# Patient Record
Sex: Female | Born: 1945
Health system: Southern US, Community
[De-identification: ages and names within clinical notes are randomized; demographics above are authoritative.]

## PROBLEM LIST (undated history)

## (undated) DIAGNOSIS — L930 Discoid lupus erythematosus: Secondary | ICD-10-CM

## (undated) DIAGNOSIS — G4700 Insomnia, unspecified: Secondary | ICD-10-CM

## (undated) DIAGNOSIS — C801 Malignant (primary) neoplasm, unspecified: Secondary | ICD-10-CM

## (undated) DIAGNOSIS — N289 Disorder of kidney and ureter, unspecified: Secondary | ICD-10-CM

## (undated) DIAGNOSIS — M8580 Other specified disorders of bone density and structure, unspecified site: Secondary | ICD-10-CM

## (undated) DIAGNOSIS — M329 Systemic lupus erythematosus, unspecified: Secondary | ICD-10-CM

## (undated) DIAGNOSIS — IMO0002 Reserved for concepts with insufficient information to code with codable children: Secondary | ICD-10-CM

## (undated) DIAGNOSIS — R51 Headache: Secondary | ICD-10-CM

## (undated) HISTORY — DX: Discoid lupus erythematosus: L93.0

## (undated) HISTORY — DX: Insomnia, unspecified: G47.00

## (undated) HISTORY — PX: PELVIC LAPAROSCOPY: SHX162

## (undated) HISTORY — DX: Systemic lupus erythematosus, unspecified: M32.9

## (undated) HISTORY — DX: Other specified disorders of bone density and structure, unspecified site: M85.80

## (undated) HISTORY — DX: Reserved for concepts with insufficient information to code with codable children: IMO0002

## (undated) HISTORY — DX: Disorder of kidney and ureter, unspecified: N28.9

## (undated) HISTORY — DX: Headache: R51

## (undated) HISTORY — PX: CATARACT EXTRACTION, BILATERAL: SHX1313

## (undated) HISTORY — DX: Malignant (primary) neoplasm, unspecified: C80.1

---

## 1999-01-17 ENCOUNTER — Other Ambulatory Visit: Admission: RE | Admit: 1999-01-17 | Discharge: 1999-01-17 | Payer: Self-pay | Admitting: Obstetrics and Gynecology

## 2000-04-17 ENCOUNTER — Other Ambulatory Visit: Admission: RE | Admit: 2000-04-17 | Discharge: 2000-04-17 | Payer: Self-pay | Admitting: Obstetrics and Gynecology

## 2001-06-26 ENCOUNTER — Other Ambulatory Visit: Admission: RE | Admit: 2001-06-26 | Discharge: 2001-06-26 | Payer: Self-pay | Admitting: Obstetrics and Gynecology

## 2002-07-03 ENCOUNTER — Other Ambulatory Visit: Admission: RE | Admit: 2002-07-03 | Discharge: 2002-07-03 | Payer: Self-pay | Admitting: Obstetrics and Gynecology

## 2003-07-09 ENCOUNTER — Other Ambulatory Visit: Admission: RE | Admit: 2003-07-09 | Discharge: 2003-07-09 | Payer: Self-pay | Admitting: Obstetrics and Gynecology

## 2003-10-05 ENCOUNTER — Ambulatory Visit (HOSPITAL_COMMUNITY): Admission: RE | Admit: 2003-10-05 | Discharge: 2003-10-05 | Payer: Self-pay | Admitting: Gastroenterology

## 2004-07-17 ENCOUNTER — Other Ambulatory Visit: Admission: RE | Admit: 2004-07-17 | Discharge: 2004-07-17 | Payer: Self-pay | Admitting: Obstetrics and Gynecology

## 2005-07-25 ENCOUNTER — Other Ambulatory Visit: Admission: RE | Admit: 2005-07-25 | Discharge: 2005-07-25 | Payer: Self-pay | Admitting: Addiction Medicine

## 2006-07-31 ENCOUNTER — Other Ambulatory Visit: Admission: RE | Admit: 2006-07-31 | Discharge: 2006-07-31 | Payer: Self-pay | Admitting: Obstetrics and Gynecology

## 2006-10-08 HISTORY — PX: OTHER SURGICAL HISTORY: SHX169

## 2006-12-09 ENCOUNTER — Emergency Department (HOSPITAL_COMMUNITY): Admission: EM | Admit: 2006-12-09 | Discharge: 2006-12-09 | Payer: Self-pay | Admitting: Emergency Medicine

## 2006-12-12 ENCOUNTER — Encounter: Admission: RE | Admit: 2006-12-12 | Discharge: 2006-12-12 | Payer: Self-pay | Admitting: Urology

## 2007-01-29 ENCOUNTER — Inpatient Hospital Stay (HOSPITAL_COMMUNITY): Admission: RE | Admit: 2007-01-29 | Discharge: 2007-02-02 | Payer: Self-pay | Admitting: Urology

## 2007-01-29 ENCOUNTER — Encounter (INDEPENDENT_AMBULATORY_CARE_PROVIDER_SITE_OTHER): Payer: Self-pay | Admitting: Specialist

## 2007-08-06 ENCOUNTER — Other Ambulatory Visit: Admission: RE | Admit: 2007-08-06 | Discharge: 2007-08-06 | Payer: Self-pay | Admitting: Obstetrics and Gynecology

## 2007-08-11 ENCOUNTER — Ambulatory Visit (HOSPITAL_COMMUNITY): Admission: RE | Admit: 2007-08-11 | Discharge: 2007-08-11 | Payer: Self-pay | Admitting: Urology

## 2008-02-23 ENCOUNTER — Ambulatory Visit (HOSPITAL_COMMUNITY): Admission: RE | Admit: 2008-02-23 | Discharge: 2008-02-23 | Payer: Self-pay | Admitting: Urology

## 2008-09-03 ENCOUNTER — Ambulatory Visit (HOSPITAL_COMMUNITY): Admission: RE | Admit: 2008-09-03 | Discharge: 2008-09-03 | Payer: Self-pay | Admitting: Urology

## 2008-09-06 ENCOUNTER — Ambulatory Visit: Payer: Self-pay | Admitting: Obstetrics and Gynecology

## 2008-09-06 ENCOUNTER — Other Ambulatory Visit: Admission: RE | Admit: 2008-09-06 | Discharge: 2008-09-06 | Payer: Self-pay | Admitting: Obstetrics and Gynecology

## 2008-09-06 ENCOUNTER — Encounter: Payer: Self-pay | Admitting: Obstetrics and Gynecology

## 2008-10-05 ENCOUNTER — Ambulatory Visit: Payer: Self-pay | Admitting: Obstetrics and Gynecology

## 2009-03-01 ENCOUNTER — Ambulatory Visit (HOSPITAL_COMMUNITY): Admission: RE | Admit: 2009-03-01 | Discharge: 2009-03-01 | Payer: Self-pay | Admitting: Urology

## 2009-10-04 ENCOUNTER — Other Ambulatory Visit: Admission: RE | Admit: 2009-10-04 | Discharge: 2009-10-04 | Payer: Self-pay | Admitting: Obstetrics and Gynecology

## 2009-10-04 ENCOUNTER — Ambulatory Visit: Payer: Self-pay | Admitting: Obstetrics and Gynecology

## 2009-10-17 ENCOUNTER — Ambulatory Visit: Payer: Self-pay | Admitting: Obstetrics and Gynecology

## 2010-02-28 ENCOUNTER — Ambulatory Visit (HOSPITAL_COMMUNITY): Admission: RE | Admit: 2010-02-28 | Discharge: 2010-02-28 | Payer: Self-pay | Admitting: Urology

## 2010-11-07 ENCOUNTER — Ambulatory Visit
Admission: RE | Admit: 2010-11-07 | Discharge: 2010-11-07 | Payer: Self-pay | Source: Home / Self Care | Attending: Obstetrics and Gynecology | Admitting: Obstetrics and Gynecology

## 2010-11-28 ENCOUNTER — Other Ambulatory Visit: Payer: Self-pay | Admitting: Obstetrics and Gynecology

## 2010-11-28 ENCOUNTER — Encounter (INDEPENDENT_AMBULATORY_CARE_PROVIDER_SITE_OTHER): Payer: Federal, State, Local not specified - PPO | Admitting: Obstetrics and Gynecology

## 2010-11-28 ENCOUNTER — Other Ambulatory Visit (HOSPITAL_COMMUNITY)
Admission: RE | Admit: 2010-11-28 | Discharge: 2010-11-28 | Disposition: A | Discharge status: Home / Self Care | Payer: Federal, State, Local not specified - PPO | Source: Ambulatory Visit | Attending: Obstetrics and Gynecology | Admitting: Obstetrics and Gynecology

## 2010-11-28 DIAGNOSIS — Z124 Encounter for screening for malignant neoplasm of cervix: Secondary | ICD-10-CM | POA: Insufficient documentation

## 2010-11-28 DIAGNOSIS — Z01419 Encounter for gynecological examination (general) (routine) without abnormal findings: Secondary | ICD-10-CM

## 2010-11-28 DIAGNOSIS — Z833 Family history of diabetes mellitus: Secondary | ICD-10-CM

## 2010-11-28 DIAGNOSIS — R634 Abnormal weight loss: Secondary | ICD-10-CM

## 2011-02-23 NOTE — Discharge Summary (Signed)
Andrea Burke, Andrea Burke             ACCOUNT NO.:  [id number]   MEDICAL RECORD NO.:  [id number]          PATIENT TYPE:  INP   LOCATION:  1438                         FACILITY:  [facility]   PHYSICIAN:  Heloise Purpura, MD      DATE OF BIRTH:  [birth year]   DATE OF ADMISSION:  01/29/2007  DATE OF DISCHARGE:  02/02/2007                               DISCHARGE SUMMARY   ADMISSION DIAGNOSIS:  Left renal mass.   DISCHARGE DIAGNOSIS:  Left renal mass.   HISTORY AND PHYSICAL:  For full details, please see admission history  and physical.  Briefly, Andrea Burke is a 65 year old female who was  found to have an incidentally detected enhancing left renal mass  concerning for a possible renal malignancy.  After discussing options,  she elected to proceed with surgical removal.  Her metastatic evaluation  was negative.   HOSPITAL COURSE:  On January 29, 2007, the patient was taken to the  operating room.  An attempt was made to perform a partial nephrectomy.  However, due to the location of the tumor, the patient did necessitate  and a total nephrectomy for optimal cancer control.  This was performed  laparoscopically.  Postoperatively, the patient was able to be  transferred to a regular hospital room following recovery from  anesthesia.  On postoperative day #1, the patient was able to begin  ambulating which she did with minimal difficulty.  She was able to begin  a clear liquid diet.  Her renal function remained stable at 1.0 and her  hemoglobin remained stable at 11.0.  Over the course of the next couple  of days, the patient's diet was gradually advanced and she was  transitioned to oral pain medication.  She was prepared for discharge on  postoperative day #3.  However, she did have some emesis the evening  before planned discharge and therefore was monitored on postoperative  day #3.  She subsequently had improving return of bowel function with  resolution of her emesis was able to be discharged  home in good  condition on postoperative day #4.   DISPOSITION:  Home.   DISCHARGE MEDICATIONS:  The patient was given a prescription to take  Percocet as needed for pain and Colace as a stool softener.  She was  instructed to resume her regular home medications which were reviewed  with her prior to discharge.  She was specifically instructed to refrain  from any heavy lifting.  She was told to refrain from any aspirin,  nonsteroidal anti-inflammatory drugs, or herbal supplements.   DISCHARGE INSTRUCTIONS:  The patient was instructed on the signs and  symptoms of wound infection.  She was told to be ambulatory but  specifically instructed to refrain from any heavy lifting, strenuous  activity, or driving.   FOLLOW UP:  Andrea Burke will follow up in 1 week for removal of her  staples.   PATHOLOGY:  The patient's pathology was reviewed with her while in the  hospital.  This demonstrated a pT1a NX/MX Fuhrman grade IV renal cell  carcinoma with negative surgical margins.  ______________________________  Heloise Purpura, MD  Electronically Signed     LB/MEDQ  D:  02/02/2007  T:  02/02/2007  Job:  [id number]

## 2011-02-23 NOTE — Op Note (Signed)
Andrea Burke, Andrea Burke             ACCOUNT NO.:  [id number]   MEDICAL RECORD NO.:  [id number]          PATIENT TYPE:  INP   LOCATION:  1438                         FACILITY:  [facility]   PHYSICIAN:  Heloise Purpura, MD      DATE OF BIRTH:  [birth year]   DATE OF PROCEDURE:  01/29/2007  DATE OF DISCHARGE:                               OPERATIVE REPORT   PREOPERATIVE DIAGNOSIS:  Left renal mass.   POSTOPERATIVE DIAGNOSIS:  Left renal mass.   PROCEDURE:  Robotic assisted left laparoscopic partial nephrectomy  converted to laparoscopic radical nephrectomy.   SURGEON:  Heloise Purpura, M.D.   ASSISTANT:  Excell Seltzer. Annabell Howells, M.D.   ANESTHESIA:  General.   COMPLICATIONS:  None.   ESTIMATED BLOOD LOSS:  600 mL.   INTRAVENOUS FLUIDS:  3200 mL of lactated Ringer's.   SPECIMENS:  1. Base of tumor resection bed number 1 and 2.  2. Left renal mass.  3. Left kidney.   DISPOSITION OF SPECIMEN:  To pathology.   INDICATIONS FOR PROCEDURE:  Andrea Burke is a 65 year old female who was  found to have a 2.7 cm enhancing left renal mass detected incidentally.  This was worrisome for a renal cell carcinoma.  The patient underwent a  metastatic evaluation that was negative and after discussing management  options for an enhancing left renal mass, she elected to proceed with  surgical therapy.  We discussed potential surgical options including a  nephron sparing approach versus radical nephrectomy.  Due to the size of  the tumor, it was decided that a nephron sparing approach would be  ideal.  However, due to the location of the tumor near the renal hilum,  it was discussed with the patient that it may be necessary to proceed  with a total nephrectomy.  In addition, different surgical approaches  were discussed with the patient and it was discussed with the patient  that a minimally invasive approach would likely be possible, but that  she certainly was at high risk for necessitating open surgical  conversion if that was felt to be necessary.  The potential risks and  benefits of the above procedures were discussed with the patient and she  consented.   DESCRIPTION OF PROCEDURE:  The patient was taken to the operating room  and general anesthetic was administered.  She was given preoperative  antibiotics, placed in the left modified flank position, and prepped and  draped in the usual sterile fashion.  Next, a preoperative time out was  performed.  A site was selected just superior to the umbilicus for  placement of the camera port.  This was placed using a standard open  Hassan technique, thereby allowing entry into the peritoneal cavity  under direct vision.  A 12 mm port was then placed and a  pneumoperitoneum was established.  The 0 degrees lens was used to  inspect the abdomen and there was no evidence of any intra-abdominal  injuries or other abnormalities.  The remaining ports were then placed.  An 8 mm robotic port was placed in the left upper quadrant.  An  additional 8  mm robotic port was placed in the left lower quadrant.  In  addition, another robotic port was placed in the lateral and inferior  aspect of the left lower quadrant.  Assistant ports were then placed  with a 12 mm port in the lower midline and a 5 mm port in the upper  midline.  All ports were placed under direct vision without difficulty.  The surgical cart was then docked.   With the aid of the cautery scissors, the white line of Toldt was  incised along the length of the descending colon allowing the colon to  be mobilized medially and the space between the mesocolon and the  anterior layer of Gerota's fascia to be developed.  The gonadal vein was  identified and traced cephalad to the renal vein which was cleared off  of the overlying adventitial tissue.  The ureter was identified.  The  ureter was separated from the gonadal vein.  Just posterior to the renal  vein, a second renal vein was identified.   Just superior to this vein  and still posterior to the renal vein, was noted to be the main renal  artery.  The patient was noted to have a single renal artery on her  preoperative imaging study.  The renal artery and renal vein were  isolated and attention turned to the kidney.  Gerota's fascia was  entered.  The patient was noted to have an obvious exophytic lesion on  the anterior aspect of the kidney just next to and inferior to the renal  hilum.  The surrounding perinephric fat was cleared off of the renal  capsule.  The edges of the tumor were identified.  It was noted to  extend very close to but not into the renal hilum.   Once the tumor was isolated, it was examined.  It was felt that this  tumor would be amenable to an attempt at a nephron sparing approach.  12.5 grams of intravenous mannitol was administered.  Ten minutes later,  the renal artery was clamped with a laparoscopic bulldog clamp.  The  renal vein was then also clamped with a laparoscopic bulldog clamp.  The  tumor was then excised with sharp cold scissor dissection.  Once the  tumor was excised, the base of the resection site was sampled at the  deep and lateral margins for intraoperative pathology analysis.  While  one specimen was benign, the other margin was noted to be positive for  malignancy at the deep aspect of the tumor resection.  At this point, it  was felt that it would be extremely difficult and potentially unsafe to  resect deeper in the area of the renal hilum.  The renal mass was sent  for intraoperative pathologic analysis and was, indeed, consistent with  a clear cell renal cell carcinoma.  Based on this information, it was  decided to perform a total nephrectomy.   The kidney was completely mobilized.  The renal artery was divided  between multiple Hem-A-Lock clips and the renal vein was divided between  multiple Hem-A-Lock clips.  The ureter was isolated and divided off between Hem-A-Lock  clips.  The remaining attachments superior and  lateral to the kidney were then divided allowing the kidney to be freed  and placed into the EndoCatch II bag.  The renal fossa was then  reexamined.  There was no evidence of any bleeding and hemostasis  appeared excellent.  The renal fossa was copiously irrigated and  preparations  were then made for removal of the kidney and closure.  The  surgical cart was then undocked.  The 12 mm camera port site was closed  with a 0 Vicryl suture placed laparoscopically with the aid of the  suture passer device.  All remaining ports were then removed under  direct vision.  The EndoCatch II bag was then brought out through the  lower midline 12 mm port site.  This incision was extended in the lower  midline allowing the specimen to be removed intact within the EndoCatch  II bag.  Of note, the renal mass had been previously removed via an  Endopouch bag so as to avoid tumor spillage.   This opening in the fascia was then closed with a running 0 Vicryl  suture.  All incision sites were then injected with 0.25% Marcaine and  reapproximated at the skin level with staples.  Sterile dressings were  applied.  The patient  appeared to tolerate the procedure well without complications.  She was  able to be extubated and transferred to the recovery room in stable  condition.  This procedure was considerably more complex than a standard  laparscopic nephrectomy and did involve significantly more time due to  the attempt at partial nephrectomy.           ______________________________  Heloise Purpura, MD  Electronically Signed     LB/MEDQ  D:  01/29/2007  T:  01/29/2007  Job:  [id number]

## 2011-02-23 NOTE — H&P (Signed)
Andrea Burke, Andrea Burke             ACCOUNT NO.:  [id number]   MEDICAL RECORD NO.:  [id number]          PATIENT TYPE:  INP   LOCATION:  NA                           FACILITY:  [facility]   PHYSICIAN:  Heloise Purpura, MD      DATE OF BIRTH:  1946-02-15   DATE OF ADMISSION:  01/29/2007  DATE OF DISCHARGE:                              HISTORY & PHYSICAL   CHIEF COMPLAINT:  Left renal mass.   HISTORY OF PRESENT ILLNESS:  Andrea Burke is a 65 year old female who  was incidentally found to have an enhancing left renal mass after an  evaluation for abdominal pain which was subsequently felt to be  secondary to gastroenteritis.  As part of her evaluation, she did  undergo a CT scan which demonstrated a 2.7 cm exophytic enhancing renal  mass off the medial anterior aspect of the lower pole of the left  kidney.  She underwent a metastatic evaluation with a chest x-ray, liver  function test and alkaline phosphatase which were all within normal  limits.  After discussing management options for her renal mass, she  elected to proceed with surgical removal in a nephron-sparing fashion.   PAST MEDICAL HISTORY:  Lupus.   PAST SURGICAL HISTORY:  Exploratory laparoscopy for endometriosis.   MEDICATIONS:  Hydroxyzine p.r.n.   ALLERGIES:  PENICILLIN.   FAMILY HISTORY:  The patient's sister had breast cancer.  There is no  history of GU malignancy.   SOCIAL HISTORY:  She denies tobacco or alcohol use.   REVIEW OF SYSTEMS:  Positive for history of joint pain.  This is chronic  and she relates this to her lupus.  All other systems are reviewed and  are otherwise negative.   PHYSICAL EXAMINATION:  CONSTITUTIONAL:  Well-nourished, well-developed  age-appropriate female in no acute distress.  CARDIOVASCULAR:  Regular rate and rhythm without obvious murmurs.  LUNGS:  Clear bilaterally.  LYMPH NODES:  No cervical, supraclavicular, inguinal lymphadenopathy.  ABDOMEN:  Soft, nontender, nondistended without  abdominal masses.  The  patient has a well-healed keloid scar just below her umbilicus.  BACK:  No CVA tenderness.   IMPRESSION:  Left renal mass worrisome for malignancy.   PLAN:  Andrea Burke will undergo an attempted robotic assisted  laparoscopic partial nephrectomy.  She has been thoroughly counseled  about the risks of this procedure and the potential necessity for total  nephrectomy.  She will then be admitted to the hospital for routine  postoperative care.           ______________________________  Heloise Purpura, MD  Electronically Signed     LB/MEDQ  D:  01/29/2007  T:  01/29/2007  Job:  [id number]

## 2011-02-23 NOTE — Op Note (Signed)
Andrea Burke, Andrea Burke                       ACCOUNT NO.:  [id number]   MEDICAL RECORD NO.:  [id number]                   PATIENT TYPE:  AMB   LOCATION:  ENDO                                 FACILITY:  [facility]   PHYSICIAN:  John C. Madilyn Fireman, M.D.                 DATE OF BIRTH:  [birth year]   DATE OF PROCEDURE:  10/05/2003  DATE OF DISCHARGE:                                 OPERATIVE REPORT   PROCEDURE PERFORMED:  Colonoscopy.   ENDOSCOPIST:  Everardo All. Madilyn Fireman, M.D.   INDICATION FOR PROCEDURE:  Heme-positive stool.   PROCEDURE:  The patient was placed in the left lateral decubitus position  and placed on the pulse monitor with continuous low-flow oxygen delivered by  nasal cannula.  She was sedated with 50 mcg IV fentanyl and 5 mg IV Versed.  The Olympus video colonoscope was inserted into the rectum and advanced to  the cecum, confirmed by transillumination of McBurney's point and  visualization of the ileocecal valve and appendiceal orifice.  The prep was  excellent.  The cecum, ascending, transverse, descending and sigmoid colon  all appeared normal with no masses, polyps, diverticula or other mucosa  abnormalities.  The rectum likewise appeared normal in retroflexed view.  The anus revealed no obvious internal hemorrhoids.  The scope was then  withdrawn and the patient returned to the recovery room in stable condition.  She tolerated the procedure well and there were no immediate complications.   IMPRESSION:  Normal colonoscopy.   PLAN:  We will repeat Hemoccults in two to three weeks and consider upper GI  workup if persistently positive.                                               John C. Madilyn Fireman, M.D.    JCH/MEDQ  D:  10/05/2003  T:  10/05/2003  Job:  [id number]

## 2011-03-13 ENCOUNTER — Ambulatory Visit (INDEPENDENT_AMBULATORY_CARE_PROVIDER_SITE_OTHER): Payer: Federal, State, Local not specified - PPO | Admitting: Psychiatry

## 2011-03-13 DIAGNOSIS — Z7189 Other specified counseling: Secondary | ICD-10-CM

## 2011-03-13 DIAGNOSIS — F4323 Adjustment disorder with mixed anxiety and depressed mood: Secondary | ICD-10-CM

## 2011-03-20 ENCOUNTER — Ambulatory Visit (HOSPITAL_COMMUNITY)
Admission: RE | Admit: 2011-03-20 | Discharge: 2011-03-20 | Disposition: A | Discharge status: Home / Self Care | Payer: Federal, State, Local not specified - PPO | Source: Ambulatory Visit | Attending: Urology | Admitting: Urology

## 2011-03-20 ENCOUNTER — Other Ambulatory Visit (HOSPITAL_COMMUNITY): Payer: Self-pay | Admitting: Urology

## 2011-03-20 DIAGNOSIS — Z87891 Personal history of nicotine dependence: Secondary | ICD-10-CM | POA: Insufficient documentation

## 2011-03-20 DIAGNOSIS — Z85528 Personal history of other malignant neoplasm of kidney: Secondary | ICD-10-CM | POA: Insufficient documentation

## 2011-11-23 DIAGNOSIS — C801 Malignant (primary) neoplasm, unspecified: Secondary | ICD-10-CM | POA: Insufficient documentation

## 2011-11-23 DIAGNOSIS — R519 Headache, unspecified: Secondary | ICD-10-CM | POA: Insufficient documentation

## 2011-11-23 DIAGNOSIS — R51 Headache: Secondary | ICD-10-CM | POA: Insufficient documentation

## 2011-11-23 DIAGNOSIS — M8580 Other specified disorders of bone density and structure, unspecified site: Secondary | ICD-10-CM | POA: Insufficient documentation

## 2011-11-23 DIAGNOSIS — L930 Discoid lupus erythematosus: Secondary | ICD-10-CM | POA: Insufficient documentation

## 2011-11-23 DIAGNOSIS — N809 Endometriosis, unspecified: Secondary | ICD-10-CM | POA: Insufficient documentation

## 2011-11-23 DIAGNOSIS — G4700 Insomnia, unspecified: Secondary | ICD-10-CM | POA: Insufficient documentation

## 2011-12-03 ENCOUNTER — Ambulatory Visit (INDEPENDENT_AMBULATORY_CARE_PROVIDER_SITE_OTHER): Payer: Federal, State, Local not specified - PPO | Admitting: Obstetrics and Gynecology

## 2011-12-03 ENCOUNTER — Encounter: Payer: Self-pay | Admitting: Obstetrics and Gynecology

## 2011-12-03 ENCOUNTER — Other Ambulatory Visit (HOSPITAL_COMMUNITY)
Admission: RE | Admit: 2011-12-03 | Discharge: 2011-12-03 | Disposition: A | Discharge status: Home / Self Care | Payer: Federal, State, Local not specified - PPO | Source: Ambulatory Visit | Attending: Obstetrics and Gynecology | Admitting: Obstetrics and Gynecology

## 2011-12-03 VITALS — BP 124/78 | Ht 60.0 in | Wt 140.0 lb

## 2011-12-03 DIAGNOSIS — N898 Other specified noninflammatory disorders of vagina: Secondary | ICD-10-CM

## 2011-12-03 DIAGNOSIS — Z01419 Encounter for gynecological examination (general) (routine) without abnormal findings: Secondary | ICD-10-CM | POA: Insufficient documentation

## 2011-12-03 DIAGNOSIS — N899 Noninflammatory disorder of vagina, unspecified: Secondary | ICD-10-CM

## 2011-12-03 LAB — LIPID PANEL
LDL Cholesterol: 88 mg/dL (ref 0–99)
Triglycerides: 110 mg/dL (ref <150)

## 2011-12-03 LAB — WET PREP FOR TRICH, YEAST, CLUE: Clue Cells Wet Prep HPF POC: NONE SEEN

## 2011-12-03 MED ORDER — NYSTATIN-TRIAMCINOLONE 100000-0.1 UNIT/GM-% EX OINT: TOPICAL_OINTMENT | Freq: Two times a day (BID) | CUTANEOUS | Status: AC

## 2011-12-03 NOTE — Progress Notes (Signed)
Patient came to see me today for her annual GYN exam. She is doing well without hormone replacement therapy. She is having no vaginal bleeding. She is having no pelvic pain. She's noticed some vaginal irritation at the introitus since December. She is not treated it. She has not been on antibiotics. She also has loss of urine with sneezing, laughing, and running. She does Kegel exercises irregularly. She has osteopenia without an elevated FRAX risk. She has had no fractures. Her last bone density was 2012. She is up-to-date on mammograms.  Physical examination: Kennon Portela present. HEENT within normal limits. Neck: Thyroid not large. No masses. Supraclavicular nodes: not enlarged. Breasts: Examined in both sitting midline position. No skin changes and no masses. Abdomen: Soft no guarding rebound or masses or hernia. Pelvic: External: Within normal limits. BUS: Within normal limits. Vaginal:within normal limits. Poor  estrogen effect. No evidence of cystocele rectocele or enterocele. Cervix: clean. Uterus: Normal size and shape. Adnexa: No masses. Rectovaginal exam: Confirmatory and negative. Extremities: Within normal limits.  Wet smear negative for yeast, amine, trich and clue cells.  Assessment: #1. Urinary stress incontinence #2. Osteopenia #3. Yeast vaginitis  Plan: Mycolog cream. Continue bone densities. Continue yearly mammograms. Increase Kegel exercises. Urological consult if incontinence gets worse.

## 2011-12-04 LAB — URINALYSIS W MICROSCOPIC + REFLEX CULTURE
Bacteria, UA: NONE SEEN
Casts: NONE SEEN
Crystals: NONE SEEN
Hgb urine dipstick: NEGATIVE
Ketones, ur: NEGATIVE mg/dL
Leukocytes, UA: NEGATIVE
Protein, ur: NEGATIVE mg/dL
Specific Gravity, Urine: 1.013 (ref 1.005–1.030)
Squamous Epithelial / LPF: NONE SEEN

## 2012-02-15 ENCOUNTER — Encounter: Payer: Self-pay | Admitting: Obstetrics and Gynecology

## 2012-03-25 ENCOUNTER — Ambulatory Visit (HOSPITAL_COMMUNITY)
Admission: RE | Admit: 2012-03-25 | Discharge: 2012-03-25 | Disposition: A | Discharge status: Home / Self Care | Payer: Federal, State, Local not specified - PPO | Source: Ambulatory Visit | Attending: Urology | Admitting: Urology

## 2012-03-25 ENCOUNTER — Other Ambulatory Visit (HOSPITAL_COMMUNITY): Payer: Self-pay | Admitting: Urology

## 2012-03-25 DIAGNOSIS — C649 Malignant neoplasm of unspecified kidney, except renal pelvis: Secondary | ICD-10-CM

## 2012-05-12 DIAGNOSIS — B351 Tinea unguium: Secondary | ICD-10-CM | POA: Insufficient documentation

## 2012-05-12 DIAGNOSIS — L814 Other melanin hyperpigmentation: Secondary | ICD-10-CM | POA: Insufficient documentation

## 2013-02-09 ENCOUNTER — Other Ambulatory Visit: Payer: Self-pay | Admitting: Obstetrics and Gynecology

## 2013-02-09 ENCOUNTER — Other Ambulatory Visit (HOSPITAL_COMMUNITY)
Admission: RE | Admit: 2013-02-09 | Discharge: 2013-02-09 | Disposition: A | Discharge status: Home / Self Care | Payer: Federal, State, Local not specified - PPO | Source: Ambulatory Visit | Attending: Obstetrics and Gynecology | Admitting: Obstetrics and Gynecology

## 2013-02-09 DIAGNOSIS — N760 Acute vaginitis: Secondary | ICD-10-CM | POA: Insufficient documentation

## 2013-02-09 DIAGNOSIS — Z1151 Encounter for screening for human papillomavirus (HPV): Secondary | ICD-10-CM | POA: Insufficient documentation

## 2013-02-09 DIAGNOSIS — Z01419 Encounter for gynecological examination (general) (routine) without abnormal findings: Secondary | ICD-10-CM | POA: Insufficient documentation

## 2013-03-25 ENCOUNTER — Other Ambulatory Visit (HOSPITAL_COMMUNITY): Payer: Self-pay | Admitting: Urology

## 2013-03-25 ENCOUNTER — Ambulatory Visit (HOSPITAL_COMMUNITY)
Admission: RE | Admit: 2013-03-25 | Discharge: 2013-03-25 | Disposition: A | Discharge status: Home / Self Care | Payer: Federal, State, Local not specified - PPO | Source: Ambulatory Visit | Attending: Urology | Admitting: Urology

## 2013-03-25 DIAGNOSIS — C649 Malignant neoplasm of unspecified kidney, except renal pelvis: Secondary | ICD-10-CM

## 2013-06-09 ENCOUNTER — Ambulatory Visit (INDEPENDENT_AMBULATORY_CARE_PROVIDER_SITE_OTHER): Payer: Federal, State, Local not specified - PPO | Admitting: Emergency Medicine

## 2013-06-09 VITALS — BP 116/62 | HR 81 | Temp 98.6°F | Resp 18 | Ht 60.0 in | Wt 145.0 lb

## 2013-06-09 DIAGNOSIS — W57XXXA Bitten or stung by nonvenomous insect and other nonvenomous arthropods, initial encounter: Secondary | ICD-10-CM

## 2013-06-09 DIAGNOSIS — T148 Other injury of unspecified body region: Secondary | ICD-10-CM

## 2013-06-09 MED ORDER — CYPROHEPTADINE HCL 4 MG PO TABS: 4.0000 mg | ORAL_TABLET | Freq: Four times a day (QID) | ORAL | Status: DC | PRN

## 2013-06-09 MED ORDER — TRIAMCINOLONE ACETONIDE 0.1 % EX CREA: TOPICAL_CREAM | Freq: Two times a day (BID) | CUTANEOUS | Status: DC

## 2013-06-09 NOTE — Progress Notes (Signed)
[facility] [address] [place] [address] [phone]  Date:  06/09/2013   Name:  Andrea Burke   DOB:  04-04-1946   MRN:  [id number]  PCP:  Mickie Hillier, MD    Chief Complaint: Insect Bite   History of Present Illness:  Andrea Burke is a 67 y.o. very pleasant female patient who presents with the following:  Working in the yard on Sunday wearing long sleeves. Was bitten by something on her right forearm.  No systemic effects.  Has intense pruritis and local erythema.  Some improvement in itching at night with nightly benadryl.    Patient Active Problem List   Diagnosis Date Noted  . Discoid lupus   . Cancer   . Headache   . Insomnia   . Endometriosis   . Osteopenia     Past Medical History  Diagnosis Date  . Discoid lupus   . Cancer     KIDNEY  . Headache(784.0)   . Insomnia   . Endometriosis   . Osteopenia   . Urinary incontinence     Past Surgical History  Procedure Laterality Date  . Pelvic laparoscopy      DIAG LASER LAP-ENDOMETRIOSIS  . Removal of left kidney  2008    History  Substance Use Topics  . Smoking status: Former Games developer  . Smokeless tobacco: Not on file  . Alcohol Use: Yes     Comment: rare    Family History  Problem Relation Age of Onset  . Hypertension Mother   . Breast cancer Sister     Allergies  Allergen Reactions  . Doxycycline Calcium   . Penicillins     Medication list has been reviewed and updated.  Current Outpatient Prescriptions on File Prior to Visit  Medication Sig Dispense Refill  . aspirin 81 MG tablet Take 81 mg by mouth daily.      [name] Kitchen BUTALBITAL-ACETAMINOPHEN PO Take by mouth.      . Calcium Carbonate-Vitamin D (CALTRATE 600+D PO) Take by mouth.      . fish oil-omega-3 fatty acids 1000 MG capsule Take 2 g by mouth daily.      . hydroxychloroquine (PLAQUENIL) 200 MG tablet Take by mouth daily.      [name] Kitchen HYDROXYZINE HCL PO Take by mouth.      . IMIPRAMINE HCL PO Take by  mouth.      . Multiple Vitamins-Minerals (CENTRUM PO) Take by mouth.       No current facility-administered medications on file prior to visit.    Review of Systems:  As per HPI, otherwise negative.    Physical Examination: Filed Vitals:   06/09/13 1252  BP: 116/62  Pulse: 81  Temp: 98.6 F (37 C)  Resp: 18   Filed Vitals:   06/09/13 1252  Height: 5' (1.524 m)  Weight: 145 lb (65.772 kg)   Body mass index is 28.32 kg/(m^2). Ideal Body Weight: Weight in (lb) to have BMI = 25: 127.7   GEN: WDWN, NAD, Non-toxic, Alert & Oriented x 3 HEENT: Atraumatic, Normocephalic.  Ears and Nose: No external deformity. EXTR: No clubbing/cyanosis/edema NEURO: Normal gait.  PSYCH: Normally interactive. Conversant. Not depressed or anxious appearing.  Calm demeanor.  SKIN:  Right forearm erythema and pruritis.  No cellulitis.  Assessment and Plan: Insect bite TAC Continue hydroxyzine   Signed,  Phillips Odor, MD

## 2013-06-09 NOTE — Patient Instructions (Addendum)
Insect Bite  Mosquitoes, flies, fleas, bedbugs, and many other insects can bite. Insect bites are different from insect stings. A sting is when venom is injected into the skin. Some insect bites can transmit infectious diseases.  SYMPTOMS   Insect bites usually turn red, swell, and itch for 2 to 4 days. They often go away on their own.  TREATMENT   Your caregiver may prescribe antibiotic medicines if a bacterial infection develops in the bite.  HOME CARE INSTRUCTIONS   Do not scratch the bite area.   Keep the bite area clean and dry. Wash the bite area thoroughly with soap and water.   Put ice or cool compresses on the bite area.   Put ice in a plastic bag.   Place a towel between your skin and the bag.   Leave the ice on for 20 minutes, 4 times a day for the first 2 to 3 days, or as directed.   You may apply a baking soda paste, cortisone cream, or calamine lotion to the bite area as directed by your caregiver. This can help reduce itching and swelling.   Only take over-the-counter or prescription medicines as directed by your caregiver.   If you are given antibiotics, take them as directed. Finish them even if you start to feel better.  You may need a tetanus shot if:   You cannot remember when you had your last tetanus shot.   You have never had a tetanus shot.   The injury broke your skin.  If you get a tetanus shot, your arm may swell, get red, and feel warm to the touch. This is common and not a problem. If you need a tetanus shot and you choose not to have one, there is a rare chance of getting tetanus. Sickness from tetanus can be serious.  SEEK IMMEDIATE MEDICAL CARE IF:    You have increased pain, redness, or swelling in the bite area.   You see a red line on the skin coming from the bite.   You have a fever.   You have joint pain.   You have a headache or neck pain.   You have unusual weakness.   You have a rash.   You have chest pain or shortness of breath.    You have abdominal pain, nausea, or vomiting.   You feel unusually tired or sleepy.  MAKE SURE YOU:    Understand these instructions.   Will watch your condition.   Will get help right away if you are not doing well or get worse.  Document Released: 11/01/2004 Document Revised: 12/17/2011 Document Reviewed: 04/25/2011  [employer] Patient Information 2014 [employer]

## 2013-07-13 DIAGNOSIS — G43719 Chronic migraine without aura, intractable, without status migrainosus: Secondary | ICD-10-CM | POA: Diagnosis not present

## 2013-07-13 DIAGNOSIS — Z79899 Other long term (current) drug therapy: Secondary | ICD-10-CM | POA: Diagnosis not present

## 2013-07-13 DIAGNOSIS — G43019 Migraine without aura, intractable, without status migrainosus: Secondary | ICD-10-CM | POA: Diagnosis not present

## 2013-07-27 DIAGNOSIS — M899 Disorder of bone, unspecified: Secondary | ICD-10-CM | POA: Diagnosis not present

## 2013-12-29 DIAGNOSIS — N760 Acute vaginitis: Secondary | ICD-10-CM | POA: Diagnosis not present

## 2013-12-29 DIAGNOSIS — N94819 Vulvodynia, unspecified: Secondary | ICD-10-CM | POA: Diagnosis not present

## 2013-12-29 DIAGNOSIS — R32 Unspecified urinary incontinence: Secondary | ICD-10-CM | POA: Diagnosis not present

## 2013-12-31 DIAGNOSIS — Z79899 Other long term (current) drug therapy: Secondary | ICD-10-CM | POA: Diagnosis not present

## 2014-01-11 DIAGNOSIS — G43719 Chronic migraine without aura, intractable, without status migrainosus: Secondary | ICD-10-CM | POA: Diagnosis not present

## 2014-01-11 DIAGNOSIS — G43019 Migraine without aura, intractable, without status migrainosus: Secondary | ICD-10-CM | POA: Diagnosis not present

## 2014-02-18 DIAGNOSIS — Z803 Family history of malignant neoplasm of breast: Secondary | ICD-10-CM | POA: Diagnosis not present

## 2014-02-18 DIAGNOSIS — Z1231 Encounter for screening mammogram for malignant neoplasm of breast: Secondary | ICD-10-CM | POA: Diagnosis not present

## 2014-02-23 ENCOUNTER — Other Ambulatory Visit: Payer: Self-pay | Admitting: Nurse Practitioner

## 2014-02-23 ENCOUNTER — Other Ambulatory Visit (HOSPITAL_COMMUNITY)
Admission: RE | Admit: 2014-02-23 | Discharge: 2014-02-23 | Disposition: A | Discharge status: Home / Self Care | Payer: Medicare Other | Source: Ambulatory Visit | Attending: Obstetrics and Gynecology | Admitting: Obstetrics and Gynecology

## 2014-02-23 DIAGNOSIS — M899 Disorder of bone, unspecified: Secondary | ICD-10-CM | POA: Diagnosis not present

## 2014-02-23 DIAGNOSIS — R300 Dysuria: Secondary | ICD-10-CM | POA: Diagnosis not present

## 2014-02-23 DIAGNOSIS — Z124 Encounter for screening for malignant neoplasm of cervix: Secondary | ICD-10-CM | POA: Insufficient documentation

## 2014-02-23 DIAGNOSIS — M949 Disorder of cartilage, unspecified: Secondary | ICD-10-CM | POA: Diagnosis not present

## 2014-03-09 DIAGNOSIS — C649 Malignant neoplasm of unspecified kidney, except renal pelvis: Secondary | ICD-10-CM | POA: Diagnosis not present

## 2014-03-16 ENCOUNTER — Ambulatory Visit (HOSPITAL_COMMUNITY)
Admission: RE | Admit: 2014-03-16 | Discharge: 2014-03-16 | Disposition: A | Discharge status: Home / Self Care | Payer: Medicare Other | Source: Ambulatory Visit | Attending: Urology | Admitting: Urology

## 2014-03-16 ENCOUNTER — Other Ambulatory Visit (HOSPITAL_COMMUNITY): Payer: Self-pay | Admitting: Urology

## 2014-03-16 DIAGNOSIS — C649 Malignant neoplasm of unspecified kidney, except renal pelvis: Secondary | ICD-10-CM

## 2014-03-16 DIAGNOSIS — R918 Other nonspecific abnormal finding of lung field: Secondary | ICD-10-CM | POA: Diagnosis not present

## 2014-03-16 DIAGNOSIS — Z85528 Personal history of other malignant neoplasm of kidney: Secondary | ICD-10-CM | POA: Insufficient documentation

## 2014-04-01 DIAGNOSIS — L723 Sebaceous cyst: Secondary | ICD-10-CM | POA: Diagnosis not present

## 2014-04-01 DIAGNOSIS — Z79899 Other long term (current) drug therapy: Secondary | ICD-10-CM | POA: Diagnosis not present

## 2014-04-01 DIAGNOSIS — L930 Discoid lupus erythematosus: Secondary | ICD-10-CM | POA: Diagnosis not present

## 2014-04-01 DIAGNOSIS — I789 Disease of capillaries, unspecified: Secondary | ICD-10-CM | POA: Diagnosis not present

## 2014-04-01 DIAGNOSIS — L821 Other seborrheic keratosis: Secondary | ICD-10-CM | POA: Diagnosis not present

## 2014-04-07 DIAGNOSIS — R32 Unspecified urinary incontinence: Secondary | ICD-10-CM | POA: Diagnosis not present

## 2014-04-07 DIAGNOSIS — C649 Malignant neoplasm of unspecified kidney, except renal pelvis: Secondary | ICD-10-CM | POA: Diagnosis not present

## 2014-04-12 DIAGNOSIS — H2510 Age-related nuclear cataract, unspecified eye: Secondary | ICD-10-CM | POA: Diagnosis not present

## 2014-04-12 DIAGNOSIS — H25019 Cortical age-related cataract, unspecified eye: Secondary | ICD-10-CM | POA: Diagnosis not present

## 2014-04-12 DIAGNOSIS — Z79899 Other long term (current) drug therapy: Secondary | ICD-10-CM | POA: Diagnosis not present

## 2014-04-13 DIAGNOSIS — Z79899 Other long term (current) drug therapy: Secondary | ICD-10-CM | POA: Diagnosis not present

## 2014-07-21 DIAGNOSIS — G43719 Chronic migraine without aura, intractable, without status migrainosus: Secondary | ICD-10-CM | POA: Diagnosis not present

## 2014-07-21 DIAGNOSIS — G43019 Migraine without aura, intractable, without status migrainosus: Secondary | ICD-10-CM | POA: Diagnosis not present

## 2014-07-31 ENCOUNTER — Ambulatory Visit (INDEPENDENT_AMBULATORY_CARE_PROVIDER_SITE_OTHER): Payer: Medicare Other | Admitting: Family Medicine

## 2014-07-31 VITALS — BP 130/78 | HR 66 | Temp 98.1°F | Resp 16 | Ht 61.0 in | Wt 133.4 lb

## 2014-07-31 DIAGNOSIS — J029 Acute pharyngitis, unspecified: Secondary | ICD-10-CM

## 2014-07-31 DIAGNOSIS — R5381 Other malaise: Secondary | ICD-10-CM

## 2014-07-31 LAB — COMPREHENSIVE METABOLIC PANEL
ALT: 17 U/L (ref 0–35)
AST: 21 U/L (ref 0–37)
Albumin: 4.3 g/dL (ref 3.5–5.2)
Alkaline Phosphatase: 54 U/L (ref 39–117)
BUN: 11 mg/dL (ref 6–23)
CO2: 24 mEq/L (ref 19–32)
Calcium: 9.5 mg/dL (ref 8.4–10.5)
Chloride: 107 mEq/L (ref 96–112)
Creat: 0.72 mg/dL (ref 0.50–1.10)
Glucose, Bld: 88 mg/dL (ref 70–99)
Potassium: 4.2 mEq/L (ref 3.5–5.3)
Sodium: 138 mEq/L (ref 135–145)
Total Bilirubin: 0.6 mg/dL (ref 0.2–1.2)
Total Protein: 7.5 g/dL (ref 6.0–8.3)

## 2014-07-31 LAB — POCT RAPID STREP A (OFFICE): Rapid Strep A Screen: NEGATIVE

## 2014-07-31 LAB — POCT CBC
Granulocyte percent: 61.3 %G (ref 37–80)
HCT, POC: 43.7 % (ref 37.7–47.9)
Hemoglobin: 14.2 g/dL (ref 12.2–16.2)
Lymph, poc: 2.2 (ref 0.6–3.4)
MCH, POC: 28.1 pg (ref 27–31.2)
MCHC: 32.5 g/dL (ref 31.8–35.4)
MCV: 86.6 fL (ref 80–97)
MID (cbc): 0.3 (ref 0–0.9)
MPV: 6.8 fL (ref 0–99.8)
POC Granulocyte: 3.9 (ref 2–6.9)
POC LYMPH PERCENT: 33.6 %L (ref 10–50)
POC MID %: 5.1 %M (ref 0–12)
Platelet Count, POC: 199 10*3/uL (ref 142–424)
RBC: 5.04 M/uL (ref 4.04–5.48)
RDW, POC: 13.4 %
WBC: 6.4 10*3/uL (ref 4.6–10.2)

## 2014-07-31 LAB — POCT URINALYSIS DIPSTICK
Bilirubin, UA: NEGATIVE
Glucose, UA: NEGATIVE
Ketones, UA: NEGATIVE
Leukocytes, UA: NEGATIVE
Nitrite, UA: NEGATIVE
Protein, UA: NEGATIVE
Spec Grav, UA: 1.015
Urobilinogen, UA: 0.2
pH, UA: 6.5

## 2014-07-31 LAB — POCT SEDIMENTATION RATE: POCT SED RATE: 26 mm/hr — AB (ref 0–22)

## 2014-07-31 MED ORDER — CEPHALEXIN 500 MG PO CAPS: 500.0000 mg | ORAL_CAPSULE | Freq: Two times a day (BID) | ORAL | Status: DC

## 2014-07-31 NOTE — Progress Notes (Signed)
Patient ID: Andrea Burke MRN: [id number], DOB: Jun 09, 1946, 68 y.o. Date of Encounter: 07/31/2014, 12:07 PM  This chart was scribed for Dr. Robyn Haber, MD by Erling Conte, Medical Scribe. This patient was seen in Room 12 and the patient's care was started at 12:12 PM.  Primary Physician: Gennette Pac, MD  Chief Complaint:  Chief Complaint  Patient presents with  . Sore Throat    sore throat x 1 week.  feels terrible.  no energy, nausea, not able to eat, congestion at night.      HPI: 68 y.o. year old female presents with 1 week history of sore throat.  Has associated fatigue, nausea, mild cough, congestion and HA. The congestion seems to be worse at night. No fever, rhinorrhea, sinus pressure, or otalgia. Normal hearing.  Able to swallow saliva. Decreased appetite secondary to sore throat. She takes sleep medicine for her insomnia and recently changed to Topomax. States it was giving her some side effects so she called her physician on Thursday and was told to stop taking the medication. Previously had her left kidney removed. She denies any urinary problems. She follows up with a Rheumatologist for her lupus. PCP is Dr. Rex Kras.  Denies any vomiting, diarrhea, rash, or chills.  Past Medical History  Diagnosis Date  . Discoid lupus   . Cancer     KIDNEY  . Headache(784.0)   . Insomnia   . Endometriosis   . Osteopenia   . Urinary incontinence   . Lupus   . Kidney disease      Home Meds: Prior to Admission medications   Medication Sig Start Date End Date Taking? Authorizing Provider  aspirin 81 MG tablet Take 81 mg by mouth daily.   Yes Historical Provider, MD  BUTALBITAL-ACETAMINOPHEN PO Take by mouth.   Yes Historical Provider, MD  Calcium Carbonate-Vitamin D (CALTRATE 600+D PO) Take by mouth.   Yes Historical Provider, MD  cyproheptadine (PERIACTIN) 4 MG tablet Take 1 tablet (4 mg total) by mouth 4 (four) times daily as needed. 06/09/13  Yes Roselee Culver, MD  fish oil-omega-3 fatty acids 1000 MG capsule Take 2 g by mouth daily.   Yes Historical Provider, MD  hydroxychloroquine (PLAQUENIL) 200 MG tablet Take by mouth daily.   Yes Historical Provider, MD  HYDROXYZINE HCL PO Take by mouth.   Yes Historical Provider, MD  IMIPRAMINE HCL PO Take by mouth.   Yes Historical Provider, MD  Multiple Vitamins-Minerals (CENTRUM PO) Take by mouth.   Yes Historical Provider, MD  triamcinolone cream (KENALOG) 0.1 % Apply topically 2 (two) times daily. 06/09/13  Yes Roselee Culver, MD    Allergies:  Allergies  Allergen Reactions  . Doxycycline Calcium   . Penicillins     History   Social History  . Marital Status: Married    Spouse Name: N/A    Number of Children: N/A  . Years of Education: N/A   Occupational History  . Not on file.   Social History Main Topics  . Smoking status: Former Research scientist (life sciences)  . Smokeless tobacco: Not on file  . Alcohol Use: Yes     Comment: rare  . Drug Use: Not on file  . Sexual Activity: No   Other Topics Concern  . Not on file   Social History Narrative  . No narrative on file     Review of Systems: Constitutional: negative for chills, fever, night sweats or weight changes HEENT: see above Cardiovascular:  negative for chest pain or palpitations Respiratory: negative for hemoptysis, wheezing, or shortness of breath Abdominal: positive for nausea. negative for abdominal pain, vomiting or diarrhea Dermatological: negative for rash Neurologic: positive for rash    Physical Exam: Blood pressure 130/78, pulse 66, temperature 98.1 F (36.7 C), temperature source Oral, resp. rate 16, height 5\' 1"  (1.549 m), weight 133 lb 6.4 oz (60.51 kg), SpO2 99.00%., Body mass index is 25.22 kg/(m^2). General: Well developed, well nourished, in no acute distress. Head: Normocephalic, atraumatic, eyes without discharge, sclera non-icteric, nares are patent. Bilateral auditory canals clear, TM's are without  perforation, pearly grey with reflective cone of light bilaterally. No sinus TTP. Oral cavity moist, dentition normal. Posterior pharynx with post nasal drip and mild erythema. No peritonsillar abscess or tonsillar exudate. Neck: Supple. No thyromegaly. Full ROM. No lymphadenopathy. Lungs: Clear bilaterally to auscultation without wheezes, rales, or rhonchi. Breathing is unlabored. Heart: RRR with S1 S2. No murmurs, rubs, or gallops appreciated. Abdomen: Soft, non-tender, non-distended with normoactive bowel sounds. No hepatomegaly. No rebound/guarding. No obvious abdominal masses. Msk:  Strength and tone normal for age. Extremities: No clubbing or cyanosis. No edema. Neuro: Alert and oriented X 3. Moves all extremities spontaneously. CNII-XII grossly in tact. Psych:  Responds to questions appropriately with a normal affect.   Results for orders placed in visit on 07/31/14  POCT CBC      Result Value Ref Range   WBC 6.4  4.6 - 10.2 K/uL   Lymph, poc 2.2  0.6 - 3.4   POC LYMPH PERCENT 33.6  10 - 50 %L   MID (cbc) 0.3  0 - 0.9   POC MID % 5.1  0 - 12 %M   POC Granulocyte 3.9  2 - 6.9   Granulocyte percent 61.3  37 - 80 %G   RBC 5.04  4.04 - 5.48 M/uL   Hemoglobin 14.2  12.2 - 16.2 g/dL   HCT, POC 43.7  37.7 - 47.9 %   MCV 86.6  80 - 97 fL   MCH, POC 28.1  27 - 31.2 pg   MCHC 32.5  31.8 - 35.4 g/dL   RDW, POC 13.4     Platelet Count, POC 199  142 - 424 K/uL   MPV 6.8  0 - 99.8 fL  POCT RAPID STREP A (OFFICE)      Result Value Ref Range   Rapid Strep A Screen Negative  Negative  POCT URINALYSIS DIPSTICK      Result Value Ref Range   Color, UA yellow     Clarity, UA clear     Glucose, UA neg     Bilirubin, UA neg     Ketones, UA neg     Spec Grav, UA 1.015     Blood, UA trace     pH, UA 6.5     Protein, UA neg     Urobilinogen, UA 0.2     Nitrite, UA neg     Leukocytes, UA Negative      ASSESSMENT AND PLAN:  68 y.o. year old female with  - -Tylenol/Motrin  prn -Rest/fluids -RTC precautions -RTC 3-5 days if no improvement  Signed, Robyn Haber, MD 07/31/2014 12:07 PM  I personally performed the services described in this documentation, which was scribed in my presence. The recorded information has been reviewed and is accurate.

## 2014-07-31 NOTE — Patient Instructions (Signed)
I am running some other tests that will be back until Monday. In the meantime I would like you to start an antibiotic which he take twice a day because the throat does look infected.

## 2014-08-02 LAB — CULTURE, GROUP A STREP: Organism ID, Bacteria: NORMAL

## 2014-08-07 DIAGNOSIS — J069 Acute upper respiratory infection, unspecified: Secondary | ICD-10-CM | POA: Diagnosis not present

## 2014-08-12 DIAGNOSIS — G43109 Migraine with aura, not intractable, without status migrainosus: Secondary | ICD-10-CM | POA: Diagnosis not present

## 2014-08-12 DIAGNOSIS — Z0000 Encounter for general adult medical examination without abnormal findings: Secondary | ICD-10-CM | POA: Diagnosis not present

## 2014-08-12 DIAGNOSIS — M8580 Other specified disorders of bone density and structure, unspecified site: Secondary | ICD-10-CM | POA: Diagnosis not present

## 2014-08-12 DIAGNOSIS — Z1211 Encounter for screening for malignant neoplasm of colon: Secondary | ICD-10-CM | POA: Diagnosis not present

## 2014-08-12 DIAGNOSIS — E785 Hyperlipidemia, unspecified: Secondary | ICD-10-CM | POA: Diagnosis not present

## 2014-08-12 DIAGNOSIS — C642 Malignant neoplasm of left kidney, except renal pelvis: Secondary | ICD-10-CM | POA: Diagnosis not present

## 2014-08-12 DIAGNOSIS — M859 Disorder of bone density and structure, unspecified: Secondary | ICD-10-CM | POA: Diagnosis not present

## 2014-08-12 DIAGNOSIS — Z23 Encounter for immunization: Secondary | ICD-10-CM | POA: Diagnosis not present

## 2014-08-12 DIAGNOSIS — M791 Myalgia: Secondary | ICD-10-CM | POA: Diagnosis not present

## 2014-08-12 DIAGNOSIS — L932 Other local lupus erythematosus: Secondary | ICD-10-CM | POA: Diagnosis not present

## 2014-10-11 DIAGNOSIS — Z1211 Encounter for screening for malignant neoplasm of colon: Secondary | ICD-10-CM | POA: Diagnosis not present

## 2014-10-11 DIAGNOSIS — K648 Other hemorrhoids: Secondary | ICD-10-CM | POA: Diagnosis not present

## 2014-11-01 DIAGNOSIS — I83813 Varicose veins of bilateral lower extremities with pain: Secondary | ICD-10-CM | POA: Diagnosis not present

## 2014-11-01 DIAGNOSIS — I87323 Chronic venous hypertension (idiopathic) with inflammation of bilateral lower extremity: Secondary | ICD-10-CM | POA: Diagnosis not present

## 2014-11-03 DIAGNOSIS — I87323 Chronic venous hypertension (idiopathic) with inflammation of bilateral lower extremity: Secondary | ICD-10-CM | POA: Diagnosis not present

## 2014-11-03 DIAGNOSIS — I83813 Varicose veins of bilateral lower extremities with pain: Secondary | ICD-10-CM | POA: Diagnosis not present

## 2014-11-11 DIAGNOSIS — I83813 Varicose veins of bilateral lower extremities with pain: Secondary | ICD-10-CM | POA: Diagnosis not present

## 2014-11-25 DIAGNOSIS — N94819 Vulvodynia, unspecified: Secondary | ICD-10-CM | POA: Diagnosis not present

## 2014-11-26 DIAGNOSIS — H25013 Cortical age-related cataract, bilateral: Secondary | ICD-10-CM | POA: Diagnosis not present

## 2014-11-26 DIAGNOSIS — H2513 Age-related nuclear cataract, bilateral: Secondary | ICD-10-CM | POA: Diagnosis not present

## 2015-01-10 DIAGNOSIS — G43019 Migraine without aura, intractable, without status migrainosus: Secondary | ICD-10-CM | POA: Diagnosis not present

## 2015-01-10 DIAGNOSIS — G43719 Chronic migraine without aura, intractable, without status migrainosus: Secondary | ICD-10-CM | POA: Diagnosis not present

## 2015-01-11 DIAGNOSIS — H2512 Age-related nuclear cataract, left eye: Secondary | ICD-10-CM | POA: Diagnosis not present

## 2015-01-11 DIAGNOSIS — H25812 Combined forms of age-related cataract, left eye: Secondary | ICD-10-CM | POA: Diagnosis not present

## 2015-01-11 DIAGNOSIS — H52202 Unspecified astigmatism, left eye: Secondary | ICD-10-CM | POA: Diagnosis not present

## 2015-01-11 DIAGNOSIS — H25012 Cortical age-related cataract, left eye: Secondary | ICD-10-CM | POA: Diagnosis not present

## 2015-02-08 DIAGNOSIS — H52201 Unspecified astigmatism, right eye: Secondary | ICD-10-CM | POA: Diagnosis not present

## 2015-02-08 DIAGNOSIS — H25011 Cortical age-related cataract, right eye: Secondary | ICD-10-CM | POA: Diagnosis not present

## 2015-02-08 DIAGNOSIS — H25811 Combined forms of age-related cataract, right eye: Secondary | ICD-10-CM | POA: Diagnosis not present

## 2015-02-08 DIAGNOSIS — H2511 Age-related nuclear cataract, right eye: Secondary | ICD-10-CM | POA: Diagnosis not present

## 2015-02-22 DIAGNOSIS — N952 Postmenopausal atrophic vaginitis: Secondary | ICD-10-CM | POA: Diagnosis not present

## 2015-02-22 DIAGNOSIS — M8588 Other specified disorders of bone density and structure, other site: Secondary | ICD-10-CM | POA: Diagnosis not present

## 2015-02-22 DIAGNOSIS — N764 Abscess of vulva: Secondary | ICD-10-CM | POA: Diagnosis not present

## 2015-03-01 DIAGNOSIS — Z79899 Other long term (current) drug therapy: Secondary | ICD-10-CM | POA: Diagnosis not present

## 2015-03-01 DIAGNOSIS — Z5181 Encounter for therapeutic drug level monitoring: Secondary | ICD-10-CM | POA: Diagnosis not present

## 2015-03-01 DIAGNOSIS — L603 Nail dystrophy: Secondary | ICD-10-CM | POA: Diagnosis not present

## 2015-03-01 DIAGNOSIS — L930 Discoid lupus erythematosus: Secondary | ICD-10-CM | POA: Diagnosis not present

## 2015-03-01 DIAGNOSIS — L814 Other melanin hyperpigmentation: Secondary | ICD-10-CM | POA: Diagnosis not present

## 2015-04-05 DIAGNOSIS — N760 Acute vaginitis: Secondary | ICD-10-CM | POA: Diagnosis not present

## 2015-04-07 ENCOUNTER — Ambulatory Visit (HOSPITAL_COMMUNITY)
Admission: RE | Admit: 2015-04-07 | Discharge: 2015-04-07 | Disposition: A | Discharge status: Home / Self Care | Payer: Medicare Other | Source: Ambulatory Visit | Attending: Urology | Admitting: Urology

## 2015-04-07 ENCOUNTER — Other Ambulatory Visit (HOSPITAL_COMMUNITY): Payer: Self-pay | Admitting: Urology

## 2015-04-07 DIAGNOSIS — Z048 Encounter for examination and observation for other specified reasons: Secondary | ICD-10-CM | POA: Insufficient documentation

## 2015-04-07 DIAGNOSIS — Z87891 Personal history of nicotine dependence: Secondary | ICD-10-CM | POA: Diagnosis not present

## 2015-04-07 DIAGNOSIS — Z0000 Encounter for general adult medical examination without abnormal findings: Secondary | ICD-10-CM | POA: Diagnosis not present

## 2015-04-07 DIAGNOSIS — Z1231 Encounter for screening mammogram for malignant neoplasm of breast: Secondary | ICD-10-CM | POA: Diagnosis not present

## 2015-04-07 DIAGNOSIS — Z720 Tobacco use: Secondary | ICD-10-CM | POA: Diagnosis not present

## 2015-04-07 DIAGNOSIS — Z803 Family history of malignant neoplasm of breast: Secondary | ICD-10-CM | POA: Diagnosis not present

## 2015-04-07 DIAGNOSIS — Z85528 Personal history of other malignant neoplasm of kidney: Secondary | ICD-10-CM | POA: Insufficient documentation

## 2015-04-07 DIAGNOSIS — C649 Malignant neoplasm of unspecified kidney, except renal pelvis: Secondary | ICD-10-CM

## 2015-04-20 DIAGNOSIS — N393 Stress incontinence (female) (male): Secondary | ICD-10-CM | POA: Diagnosis not present

## 2015-04-20 DIAGNOSIS — C649 Malignant neoplasm of unspecified kidney, except renal pelvis: Secondary | ICD-10-CM | POA: Diagnosis not present

## 2015-08-25 DIAGNOSIS — E785 Hyperlipidemia, unspecified: Secondary | ICD-10-CM | POA: Diagnosis not present

## 2015-08-25 DIAGNOSIS — Z23 Encounter for immunization: Secondary | ICD-10-CM | POA: Diagnosis not present

## 2015-08-25 DIAGNOSIS — G4700 Insomnia, unspecified: Secondary | ICD-10-CM | POA: Diagnosis not present

## 2015-08-25 DIAGNOSIS — M25551 Pain in right hip: Secondary | ICD-10-CM | POA: Diagnosis not present

## 2015-08-25 DIAGNOSIS — M8580 Other specified disorders of bone density and structure, unspecified site: Secondary | ICD-10-CM | POA: Diagnosis not present

## 2015-08-25 DIAGNOSIS — Z79899 Other long term (current) drug therapy: Secondary | ICD-10-CM | POA: Diagnosis not present

## 2015-08-25 DIAGNOSIS — G43109 Migraine with aura, not intractable, without status migrainosus: Secondary | ICD-10-CM | POA: Diagnosis not present

## 2015-08-25 DIAGNOSIS — Z85528 Personal history of other malignant neoplasm of kidney: Secondary | ICD-10-CM | POA: Diagnosis not present

## 2015-08-25 DIAGNOSIS — M899 Disorder of bone, unspecified: Secondary | ICD-10-CM | POA: Diagnosis not present

## 2015-09-19 DIAGNOSIS — G43719 Chronic migraine without aura, intractable, without status migrainosus: Secondary | ICD-10-CM | POA: Diagnosis not present

## 2015-09-19 DIAGNOSIS — G43019 Migraine without aura, intractable, without status migrainosus: Secondary | ICD-10-CM | POA: Diagnosis not present

## 2015-11-15 DIAGNOSIS — N760 Acute vaginitis: Secondary | ICD-10-CM | POA: Diagnosis not present

## 2015-11-15 DIAGNOSIS — N898 Other specified noninflammatory disorders of vagina: Secondary | ICD-10-CM | POA: Diagnosis not present

## 2015-11-15 DIAGNOSIS — N950 Postmenopausal bleeding: Secondary | ICD-10-CM | POA: Diagnosis not present

## 2015-11-15 DIAGNOSIS — R300 Dysuria: Secondary | ICD-10-CM | POA: Diagnosis not present

## 2015-11-21 DIAGNOSIS — R278 Other lack of coordination: Secondary | ICD-10-CM | POA: Diagnosis not present

## 2015-11-21 DIAGNOSIS — M62838 Other muscle spasm: Secondary | ICD-10-CM | POA: Diagnosis not present

## 2015-11-21 DIAGNOSIS — N393 Stress incontinence (female) (male): Secondary | ICD-10-CM | POA: Diagnosis not present

## 2015-11-21 DIAGNOSIS — M6281 Muscle weakness (generalized): Secondary | ICD-10-CM | POA: Diagnosis not present

## 2015-11-24 DIAGNOSIS — N950 Postmenopausal bleeding: Secondary | ICD-10-CM | POA: Diagnosis not present

## 2015-11-24 DIAGNOSIS — R300 Dysuria: Secondary | ICD-10-CM | POA: Diagnosis not present

## 2015-12-05 DIAGNOSIS — R102 Pelvic and perineal pain: Secondary | ICD-10-CM | POA: Diagnosis not present

## 2015-12-05 DIAGNOSIS — N393 Stress incontinence (female) (male): Secondary | ICD-10-CM | POA: Diagnosis not present

## 2015-12-05 DIAGNOSIS — R278 Other lack of coordination: Secondary | ICD-10-CM | POA: Diagnosis not present

## 2015-12-05 DIAGNOSIS — M62838 Other muscle spasm: Secondary | ICD-10-CM | POA: Diagnosis not present

## 2015-12-05 DIAGNOSIS — M6281 Muscle weakness (generalized): Secondary | ICD-10-CM | POA: Diagnosis not present

## 2015-12-06 DIAGNOSIS — N952 Postmenopausal atrophic vaginitis: Secondary | ICD-10-CM | POA: Diagnosis not present

## 2015-12-06 DIAGNOSIS — N94819 Vulvodynia, unspecified: Secondary | ICD-10-CM | POA: Diagnosis not present

## 2015-12-07 DIAGNOSIS — N393 Stress incontinence (female) (male): Secondary | ICD-10-CM | POA: Diagnosis not present

## 2015-12-07 DIAGNOSIS — M62838 Other muscle spasm: Secondary | ICD-10-CM | POA: Diagnosis not present

## 2015-12-07 DIAGNOSIS — M6281 Muscle weakness (generalized): Secondary | ICD-10-CM | POA: Diagnosis not present

## 2015-12-07 DIAGNOSIS — R102 Pelvic and perineal pain: Secondary | ICD-10-CM | POA: Diagnosis not present

## 2015-12-07 DIAGNOSIS — R278 Other lack of coordination: Secondary | ICD-10-CM | POA: Diagnosis not present

## 2015-12-22 DIAGNOSIS — R102 Pelvic and perineal pain: Secondary | ICD-10-CM | POA: Diagnosis not present

## 2015-12-22 DIAGNOSIS — R278 Other lack of coordination: Secondary | ICD-10-CM | POA: Diagnosis not present

## 2015-12-22 DIAGNOSIS — M6281 Muscle weakness (generalized): Secondary | ICD-10-CM | POA: Diagnosis not present

## 2015-12-22 DIAGNOSIS — M62838 Other muscle spasm: Secondary | ICD-10-CM | POA: Diagnosis not present

## 2015-12-30 DIAGNOSIS — R278 Other lack of coordination: Secondary | ICD-10-CM | POA: Diagnosis not present

## 2015-12-30 DIAGNOSIS — R399 Unspecified symptoms and signs involving the genitourinary system: Secondary | ICD-10-CM | POA: Diagnosis not present

## 2015-12-30 DIAGNOSIS — R102 Pelvic and perineal pain: Secondary | ICD-10-CM | POA: Diagnosis not present

## 2015-12-30 DIAGNOSIS — M6281 Muscle weakness (generalized): Secondary | ICD-10-CM | POA: Diagnosis not present

## 2015-12-30 DIAGNOSIS — M62838 Other muscle spasm: Secondary | ICD-10-CM | POA: Diagnosis not present

## 2016-01-16 DIAGNOSIS — M62838 Other muscle spasm: Secondary | ICD-10-CM | POA: Diagnosis not present

## 2016-01-16 DIAGNOSIS — R399 Unspecified symptoms and signs involving the genitourinary system: Secondary | ICD-10-CM | POA: Diagnosis not present

## 2016-01-16 DIAGNOSIS — R102 Pelvic and perineal pain: Secondary | ICD-10-CM | POA: Diagnosis not present

## 2016-01-16 DIAGNOSIS — R278 Other lack of coordination: Secondary | ICD-10-CM | POA: Diagnosis not present

## 2016-01-16 DIAGNOSIS — M6281 Muscle weakness (generalized): Secondary | ICD-10-CM | POA: Diagnosis not present

## 2016-01-30 DIAGNOSIS — M6281 Muscle weakness (generalized): Secondary | ICD-10-CM | POA: Diagnosis not present

## 2016-01-30 DIAGNOSIS — N3946 Mixed incontinence: Secondary | ICD-10-CM | POA: Diagnosis not present

## 2016-01-30 DIAGNOSIS — R399 Unspecified symptoms and signs involving the genitourinary system: Secondary | ICD-10-CM | POA: Diagnosis not present

## 2016-01-30 DIAGNOSIS — R278 Other lack of coordination: Secondary | ICD-10-CM | POA: Diagnosis not present

## 2016-01-30 DIAGNOSIS — M62838 Other muscle spasm: Secondary | ICD-10-CM | POA: Diagnosis not present

## 2016-02-08 DIAGNOSIS — R278 Other lack of coordination: Secondary | ICD-10-CM | POA: Diagnosis not present

## 2016-02-08 DIAGNOSIS — R399 Unspecified symptoms and signs involving the genitourinary system: Secondary | ICD-10-CM | POA: Diagnosis not present

## 2016-02-08 DIAGNOSIS — R102 Pelvic and perineal pain: Secondary | ICD-10-CM | POA: Diagnosis not present

## 2016-02-08 DIAGNOSIS — M6281 Muscle weakness (generalized): Secondary | ICD-10-CM | POA: Diagnosis not present

## 2016-02-08 DIAGNOSIS — M62838 Other muscle spasm: Secondary | ICD-10-CM | POA: Diagnosis not present

## 2016-02-17 DIAGNOSIS — R399 Unspecified symptoms and signs involving the genitourinary system: Secondary | ICD-10-CM | POA: Diagnosis not present

## 2016-02-17 DIAGNOSIS — R102 Pelvic and perineal pain: Secondary | ICD-10-CM | POA: Diagnosis not present

## 2016-02-17 DIAGNOSIS — M6281 Muscle weakness (generalized): Secondary | ICD-10-CM | POA: Diagnosis not present

## 2016-02-17 DIAGNOSIS — M62838 Other muscle spasm: Secondary | ICD-10-CM | POA: Diagnosis not present

## 2016-02-17 DIAGNOSIS — R278 Other lack of coordination: Secondary | ICD-10-CM | POA: Diagnosis not present

## 2016-03-01 DIAGNOSIS — M62838 Other muscle spasm: Secondary | ICD-10-CM | POA: Diagnosis not present

## 2016-03-01 DIAGNOSIS — M6281 Muscle weakness (generalized): Secondary | ICD-10-CM | POA: Diagnosis not present

## 2016-03-01 DIAGNOSIS — R102 Pelvic and perineal pain: Secondary | ICD-10-CM | POA: Diagnosis not present

## 2016-03-01 DIAGNOSIS — R278 Other lack of coordination: Secondary | ICD-10-CM | POA: Diagnosis not present

## 2016-03-01 DIAGNOSIS — R399 Unspecified symptoms and signs involving the genitourinary system: Secondary | ICD-10-CM | POA: Diagnosis not present

## 2016-03-01 DIAGNOSIS — N3946 Mixed incontinence: Secondary | ICD-10-CM | POA: Diagnosis not present

## 2016-03-12 DIAGNOSIS — Z79899 Other long term (current) drug therapy: Secondary | ICD-10-CM | POA: Diagnosis not present

## 2016-03-12 DIAGNOSIS — Z0100 Encounter for examination of eyes and vision without abnormal findings: Secondary | ICD-10-CM | POA: Diagnosis not present

## 2016-03-12 DIAGNOSIS — Z961 Presence of intraocular lens: Secondary | ICD-10-CM | POA: Diagnosis not present

## 2016-03-16 DIAGNOSIS — M6281 Muscle weakness (generalized): Secondary | ICD-10-CM | POA: Diagnosis not present

## 2016-03-16 DIAGNOSIS — Z8739 Personal history of other diseases of the musculoskeletal system and connective tissue: Secondary | ICD-10-CM | POA: Diagnosis not present

## 2016-03-16 DIAGNOSIS — R278 Other lack of coordination: Secondary | ICD-10-CM | POA: Diagnosis not present

## 2016-03-16 DIAGNOSIS — M62838 Other muscle spasm: Secondary | ICD-10-CM | POA: Diagnosis not present

## 2016-03-16 DIAGNOSIS — R102 Pelvic and perineal pain: Secondary | ICD-10-CM | POA: Diagnosis not present

## 2016-03-16 DIAGNOSIS — N393 Stress incontinence (female) (male): Secondary | ICD-10-CM | POA: Diagnosis not present

## 2016-03-26 DIAGNOSIS — L219 Seborrheic dermatitis, unspecified: Secondary | ICD-10-CM | POA: Diagnosis not present

## 2016-03-26 DIAGNOSIS — L658 Other specified nonscarring hair loss: Secondary | ICD-10-CM | POA: Diagnosis not present

## 2016-03-26 DIAGNOSIS — L910 Hypertrophic scar: Secondary | ICD-10-CM | POA: Diagnosis not present

## 2016-03-26 DIAGNOSIS — B351 Tinea unguium: Secondary | ICD-10-CM | POA: Diagnosis not present

## 2016-03-26 DIAGNOSIS — Z79899 Other long term (current) drug therapy: Secondary | ICD-10-CM | POA: Diagnosis not present

## 2016-03-26 DIAGNOSIS — L821 Other seborrheic keratosis: Secondary | ICD-10-CM | POA: Diagnosis not present

## 2016-03-26 DIAGNOSIS — L932 Other local lupus erythematosus: Secondary | ICD-10-CM | POA: Diagnosis not present

## 2016-03-26 DIAGNOSIS — L299 Pruritus, unspecified: Secondary | ICD-10-CM | POA: Diagnosis not present

## 2016-04-16 ENCOUNTER — Ambulatory Visit (HOSPITAL_COMMUNITY)
Admission: RE | Admit: 2016-04-16 | Discharge: 2016-04-16 | Disposition: A | Discharge status: Home / Self Care | Payer: Medicare Other | Source: Ambulatory Visit | Attending: Urology | Admitting: Urology

## 2016-04-16 ENCOUNTER — Other Ambulatory Visit (HOSPITAL_COMMUNITY): Payer: Self-pay | Admitting: Urology

## 2016-04-16 DIAGNOSIS — C649 Malignant neoplasm of unspecified kidney, except renal pelvis: Secondary | ICD-10-CM | POA: Diagnosis not present

## 2016-04-16 DIAGNOSIS — N3946 Mixed incontinence: Secondary | ICD-10-CM | POA: Diagnosis not present

## 2016-04-16 DIAGNOSIS — M6281 Muscle weakness (generalized): Secondary | ICD-10-CM | POA: Diagnosis not present

## 2016-04-16 DIAGNOSIS — R278 Other lack of coordination: Secondary | ICD-10-CM | POA: Diagnosis not present

## 2016-04-16 DIAGNOSIS — M545 Low back pain: Secondary | ICD-10-CM | POA: Diagnosis not present

## 2016-04-24 DIAGNOSIS — Z85528 Personal history of other malignant neoplasm of kidney: Secondary | ICD-10-CM | POA: Diagnosis not present

## 2016-04-24 DIAGNOSIS — N393 Stress incontinence (female) (male): Secondary | ICD-10-CM | POA: Diagnosis not present

## 2016-04-26 DIAGNOSIS — R278 Other lack of coordination: Secondary | ICD-10-CM | POA: Diagnosis not present

## 2016-04-26 DIAGNOSIS — M6281 Muscle weakness (generalized): Secondary | ICD-10-CM | POA: Diagnosis not present

## 2016-04-26 DIAGNOSIS — N3946 Mixed incontinence: Secondary | ICD-10-CM | POA: Diagnosis not present

## 2016-04-26 DIAGNOSIS — M545 Low back pain: Secondary | ICD-10-CM | POA: Diagnosis not present

## 2016-05-07 DIAGNOSIS — M6281 Muscle weakness (generalized): Secondary | ICD-10-CM | POA: Diagnosis not present

## 2016-05-07 DIAGNOSIS — R278 Other lack of coordination: Secondary | ICD-10-CM | POA: Diagnosis not present

## 2016-05-07 DIAGNOSIS — N3946 Mixed incontinence: Secondary | ICD-10-CM | POA: Diagnosis not present

## 2016-05-07 DIAGNOSIS — M545 Low back pain: Secondary | ICD-10-CM | POA: Diagnosis not present

## 2016-05-21 DIAGNOSIS — M545 Low back pain: Secondary | ICD-10-CM | POA: Diagnosis not present

## 2016-05-21 DIAGNOSIS — R278 Other lack of coordination: Secondary | ICD-10-CM | POA: Diagnosis not present

## 2016-05-21 DIAGNOSIS — N3946 Mixed incontinence: Secondary | ICD-10-CM | POA: Diagnosis not present

## 2016-05-21 DIAGNOSIS — M6281 Muscle weakness (generalized): Secondary | ICD-10-CM | POA: Diagnosis not present

## 2016-05-30 DIAGNOSIS — Z1231 Encounter for screening mammogram for malignant neoplasm of breast: Secondary | ICD-10-CM | POA: Diagnosis not present

## 2016-05-30 DIAGNOSIS — Z803 Family history of malignant neoplasm of breast: Secondary | ICD-10-CM | POA: Diagnosis not present

## 2016-06-28 DIAGNOSIS — E785 Hyperlipidemia, unspecified: Secondary | ICD-10-CM | POA: Diagnosis not present

## 2016-06-28 DIAGNOSIS — L0232 Furuncle of buttock: Secondary | ICD-10-CM | POA: Diagnosis not present

## 2016-06-28 DIAGNOSIS — L932 Other local lupus erythematosus: Secondary | ICD-10-CM | POA: Diagnosis not present

## 2016-06-28 DIAGNOSIS — M8580 Other specified disorders of bone density and structure, unspecified site: Secondary | ICD-10-CM | POA: Diagnosis not present

## 2016-06-28 DIAGNOSIS — Z79899 Other long term (current) drug therapy: Secondary | ICD-10-CM | POA: Diagnosis not present

## 2016-06-28 DIAGNOSIS — L0291 Cutaneous abscess, unspecified: Secondary | ICD-10-CM | POA: Diagnosis not present

## 2016-06-28 DIAGNOSIS — Z85528 Personal history of other malignant neoplasm of kidney: Secondary | ICD-10-CM | POA: Diagnosis not present

## 2016-06-28 DIAGNOSIS — Z8669 Personal history of other diseases of the nervous system and sense organs: Secondary | ICD-10-CM | POA: Diagnosis not present

## 2016-06-28 DIAGNOSIS — M899 Disorder of bone, unspecified: Secondary | ICD-10-CM | POA: Diagnosis not present

## 2016-06-28 DIAGNOSIS — F338 Other recurrent depressive disorders: Secondary | ICD-10-CM | POA: Diagnosis not present

## 2016-09-06 DIAGNOSIS — J329 Chronic sinusitis, unspecified: Secondary | ICD-10-CM | POA: Diagnosis not present

## 2016-10-15 DIAGNOSIS — M8588 Other specified disorders of bone density and structure, other site: Secondary | ICD-10-CM | POA: Diagnosis not present

## 2017-02-08 IMAGING — DX DG CHEST 2V
2 series · 2 of 2 positions shown · non-contrast
Comparison: 04/07/2015

CLINICAL DATA: Renal cell carcinoma

EXAM:
CHEST  2 VIEW

[chest pa]
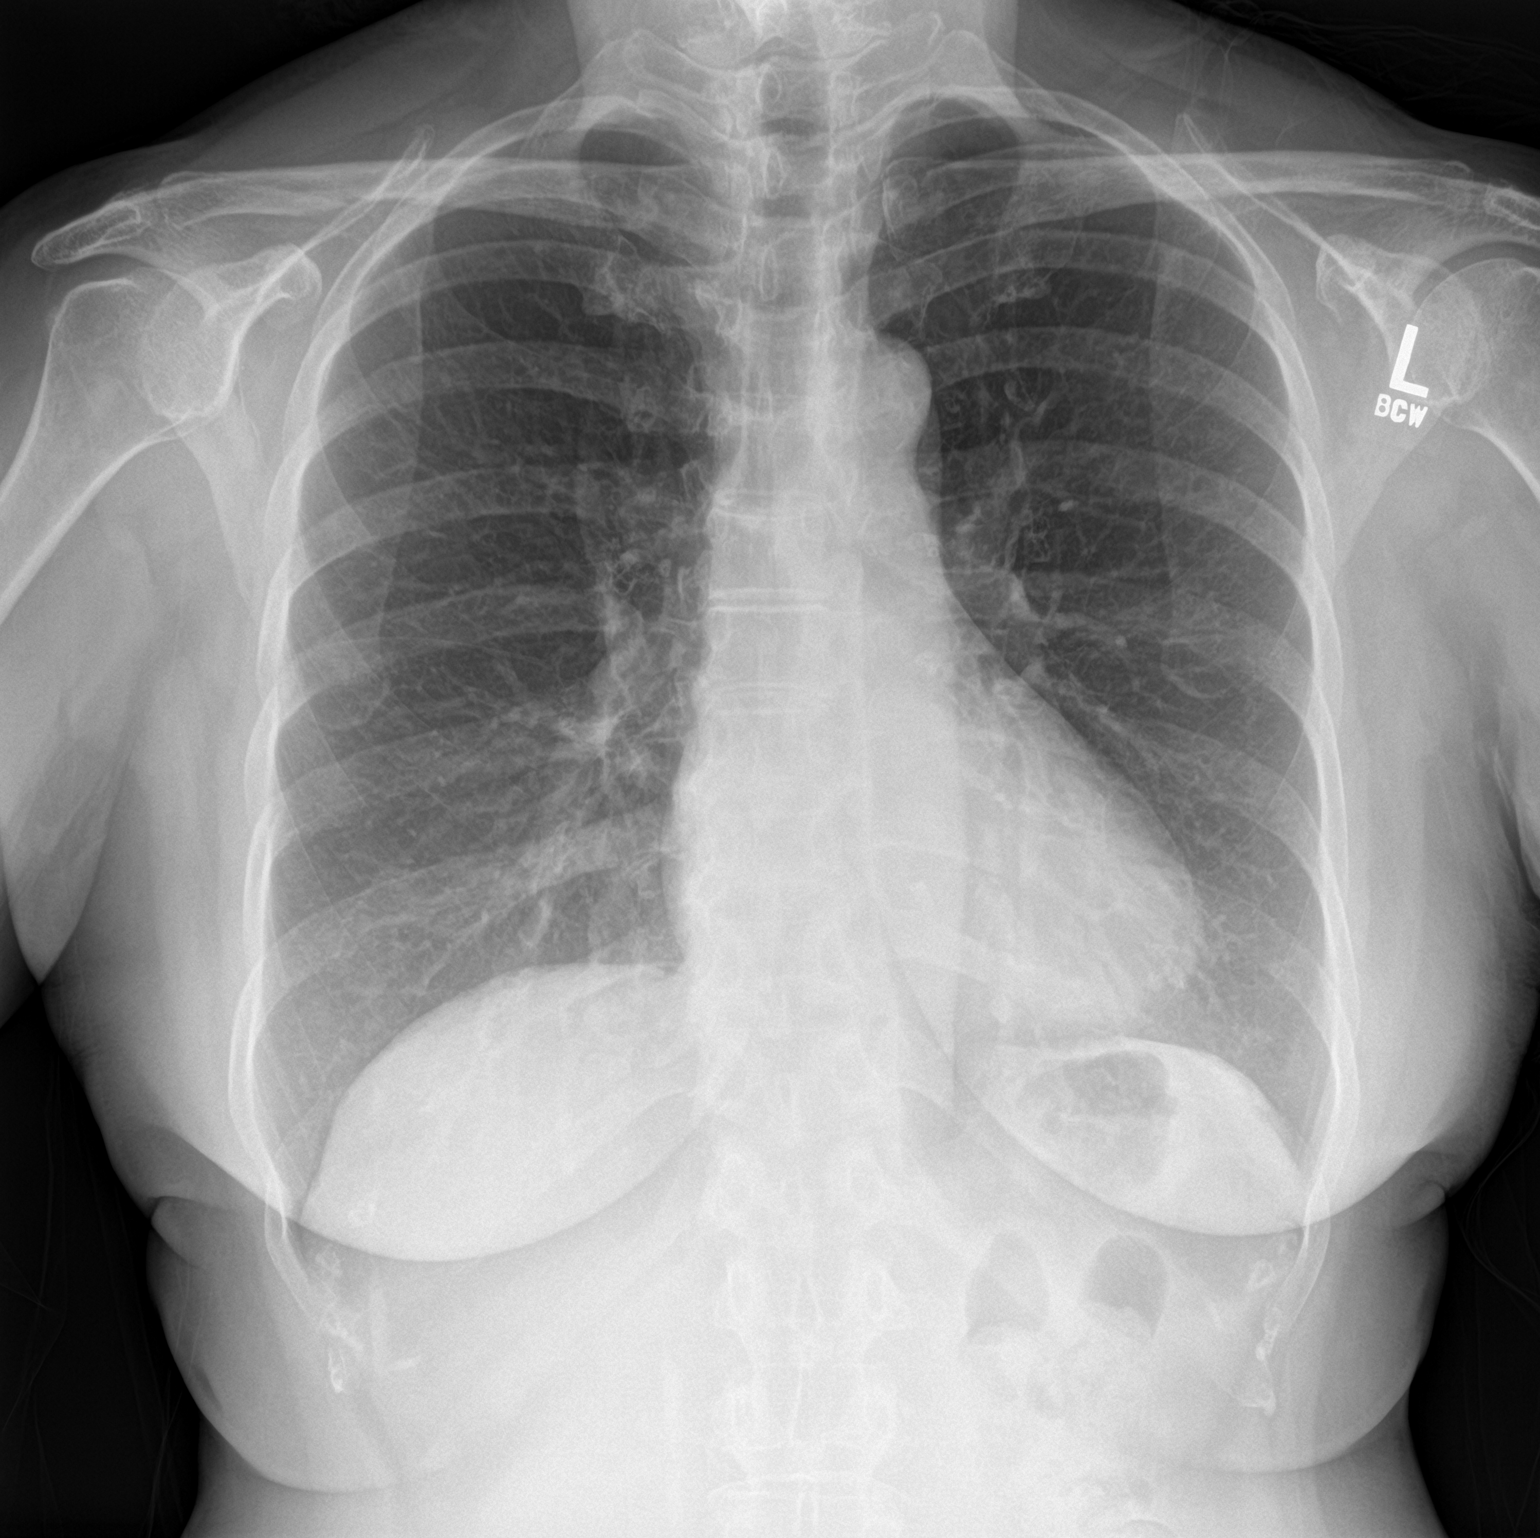

[chest lat]
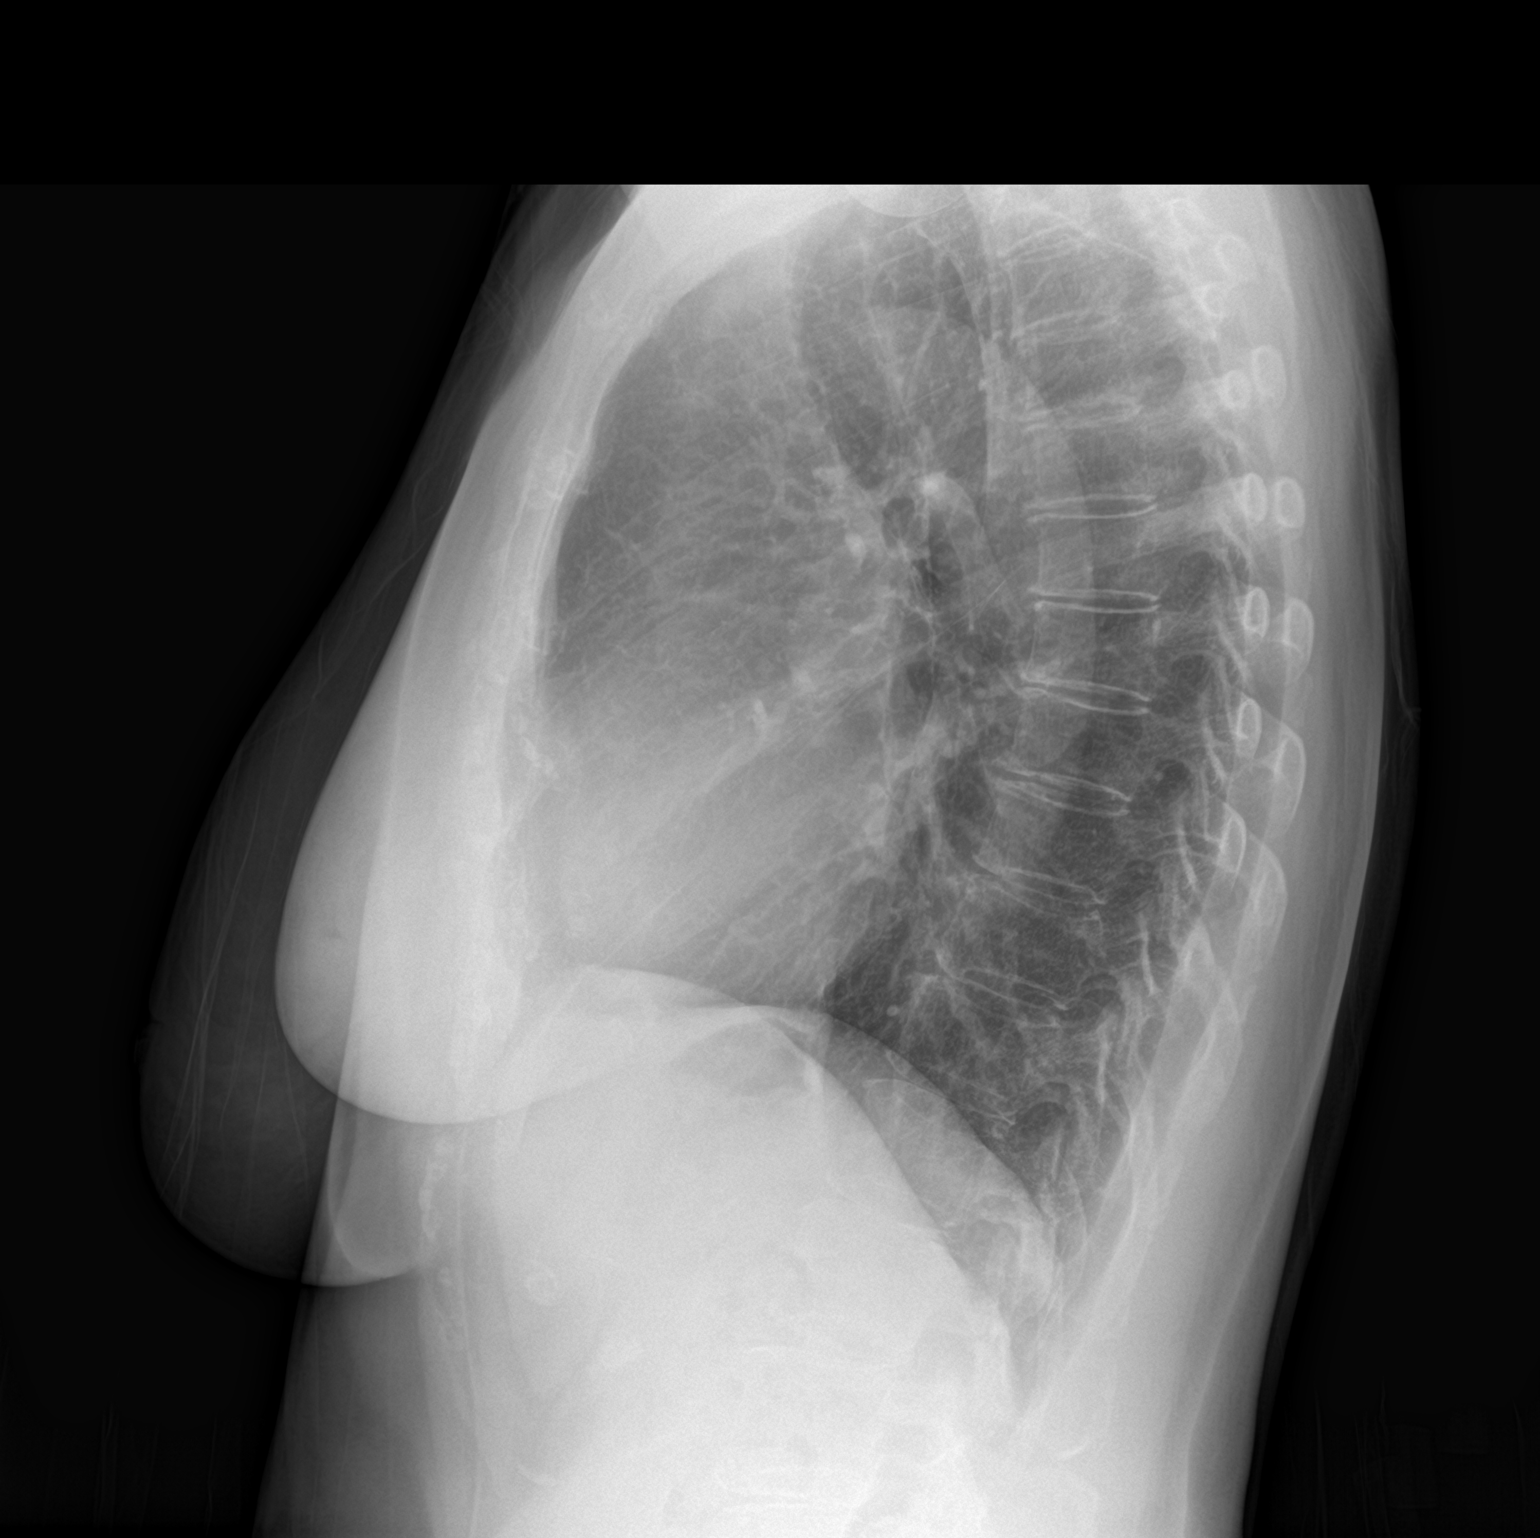

[2 of 2 positions shown; findings below may reference images not displayed]

FINDINGS: The heart size and mediastinal contours are within normal limits.
Both lungs are clear. The visualized skeletal structures are
unremarkable.
IMPRESSION: No active cardiopulmonary disease.

## 2017-04-01 DIAGNOSIS — L249 Irritant contact dermatitis, unspecified cause: Secondary | ICD-10-CM | POA: Diagnosis not present

## 2017-04-01 DIAGNOSIS — L932 Other local lupus erythematosus: Secondary | ICD-10-CM | POA: Diagnosis not present

## 2017-04-01 DIAGNOSIS — L299 Pruritus, unspecified: Secondary | ICD-10-CM | POA: Diagnosis not present

## 2017-04-01 DIAGNOSIS — M329 Systemic lupus erythematosus, unspecified: Secondary | ICD-10-CM | POA: Diagnosis not present

## 2017-04-01 DIAGNOSIS — L658 Other specified nonscarring hair loss: Secondary | ICD-10-CM | POA: Diagnosis not present

## 2017-04-01 DIAGNOSIS — L821 Other seborrheic keratosis: Secondary | ICD-10-CM | POA: Diagnosis not present

## 2017-04-01 DIAGNOSIS — L218 Other seborrheic dermatitis: Secondary | ICD-10-CM | POA: Diagnosis not present

## 2017-04-01 DIAGNOSIS — L910 Hypertrophic scar: Secondary | ICD-10-CM | POA: Diagnosis not present

## 2017-04-01 DIAGNOSIS — Z79899 Other long term (current) drug therapy: Secondary | ICD-10-CM | POA: Diagnosis not present

## 2017-04-01 DIAGNOSIS — L814 Other melanin hyperpigmentation: Secondary | ICD-10-CM | POA: Diagnosis not present

## 2017-04-01 DIAGNOSIS — Z5181 Encounter for therapeutic drug level monitoring: Secondary | ICD-10-CM | POA: Diagnosis not present

## 2017-04-11 DIAGNOSIS — Z961 Presence of intraocular lens: Secondary | ICD-10-CM | POA: Diagnosis not present

## 2017-04-11 DIAGNOSIS — H5213 Myopia, bilateral: Secondary | ICD-10-CM | POA: Diagnosis not present

## 2017-04-11 DIAGNOSIS — Z79899 Other long term (current) drug therapy: Secondary | ICD-10-CM | POA: Diagnosis not present

## 2017-04-11 DIAGNOSIS — H31002 Unspecified chorioretinal scars, left eye: Secondary | ICD-10-CM | POA: Diagnosis not present

## 2017-04-15 ENCOUNTER — Other Ambulatory Visit (HOSPITAL_COMMUNITY): Payer: Self-pay | Admitting: Urology

## 2017-04-15 ENCOUNTER — Ambulatory Visit (HOSPITAL_COMMUNITY)
Admission: RE | Admit: 2017-04-15 | Discharge: 2017-04-15 | Disposition: A | Discharge status: Home / Self Care | Payer: Medicare Other | Source: Ambulatory Visit | Attending: Urology | Admitting: Urology

## 2017-04-15 DIAGNOSIS — Z85528 Personal history of other malignant neoplasm of kidney: Secondary | ICD-10-CM

## 2017-04-24 DIAGNOSIS — Z85528 Personal history of other malignant neoplasm of kidney: Secondary | ICD-10-CM | POA: Diagnosis not present

## 2017-04-24 DIAGNOSIS — N3946 Mixed incontinence: Secondary | ICD-10-CM | POA: Diagnosis not present

## 2017-05-31 DIAGNOSIS — Z1231 Encounter for screening mammogram for malignant neoplasm of breast: Secondary | ICD-10-CM | POA: Diagnosis not present

## 2017-05-31 DIAGNOSIS — Z803 Family history of malignant neoplasm of breast: Secondary | ICD-10-CM | POA: Diagnosis not present

## 2017-11-14 DIAGNOSIS — R197 Diarrhea, unspecified: Secondary | ICD-10-CM | POA: Diagnosis not present

## 2017-11-14 DIAGNOSIS — R112 Nausea with vomiting, unspecified: Secondary | ICD-10-CM | POA: Diagnosis not present

## 2017-11-15 DIAGNOSIS — R197 Diarrhea, unspecified: Secondary | ICD-10-CM | POA: Diagnosis not present

## 2017-12-12 DIAGNOSIS — Z01419 Encounter for gynecological examination (general) (routine) without abnormal findings: Secondary | ICD-10-CM | POA: Diagnosis not present

## 2017-12-12 DIAGNOSIS — N941 Unspecified dyspareunia: Secondary | ICD-10-CM | POA: Diagnosis not present

## 2017-12-12 DIAGNOSIS — N393 Stress incontinence (female) (male): Secondary | ICD-10-CM | POA: Diagnosis not present

## 2017-12-16 ENCOUNTER — Ambulatory Visit (INDEPENDENT_AMBULATORY_CARE_PROVIDER_SITE_OTHER): Payer: Medicare Other | Admitting: Psychology

## 2017-12-16 DIAGNOSIS — F321 Major depressive disorder, single episode, moderate: Secondary | ICD-10-CM

## 2017-12-16 DIAGNOSIS — F411 Generalized anxiety disorder: Secondary | ICD-10-CM | POA: Diagnosis not present

## 2018-01-09 ENCOUNTER — Ambulatory Visit: Payer: Medicare Other | Admitting: Psychology

## 2018-01-20 DIAGNOSIS — F322 Major depressive disorder, single episode, severe without psychotic features: Secondary | ICD-10-CM | POA: Diagnosis not present

## 2018-01-23 ENCOUNTER — Ambulatory Visit (INDEPENDENT_AMBULATORY_CARE_PROVIDER_SITE_OTHER): Payer: Medicare Other | Admitting: Psychology

## 2018-01-23 DIAGNOSIS — F321 Major depressive disorder, single episode, moderate: Secondary | ICD-10-CM | POA: Diagnosis not present

## 2018-01-23 DIAGNOSIS — F411 Generalized anxiety disorder: Secondary | ICD-10-CM | POA: Diagnosis not present

## 2018-02-07 IMAGING — DX DG CHEST 2V
2 series · 2 of 2 positions shown · non-contrast
Comparison: Radiographs April 16, 2016.

CLINICAL DATA: Personal history of kidney cancer.

EXAM:
CHEST  2 VIEW

[chest pa]
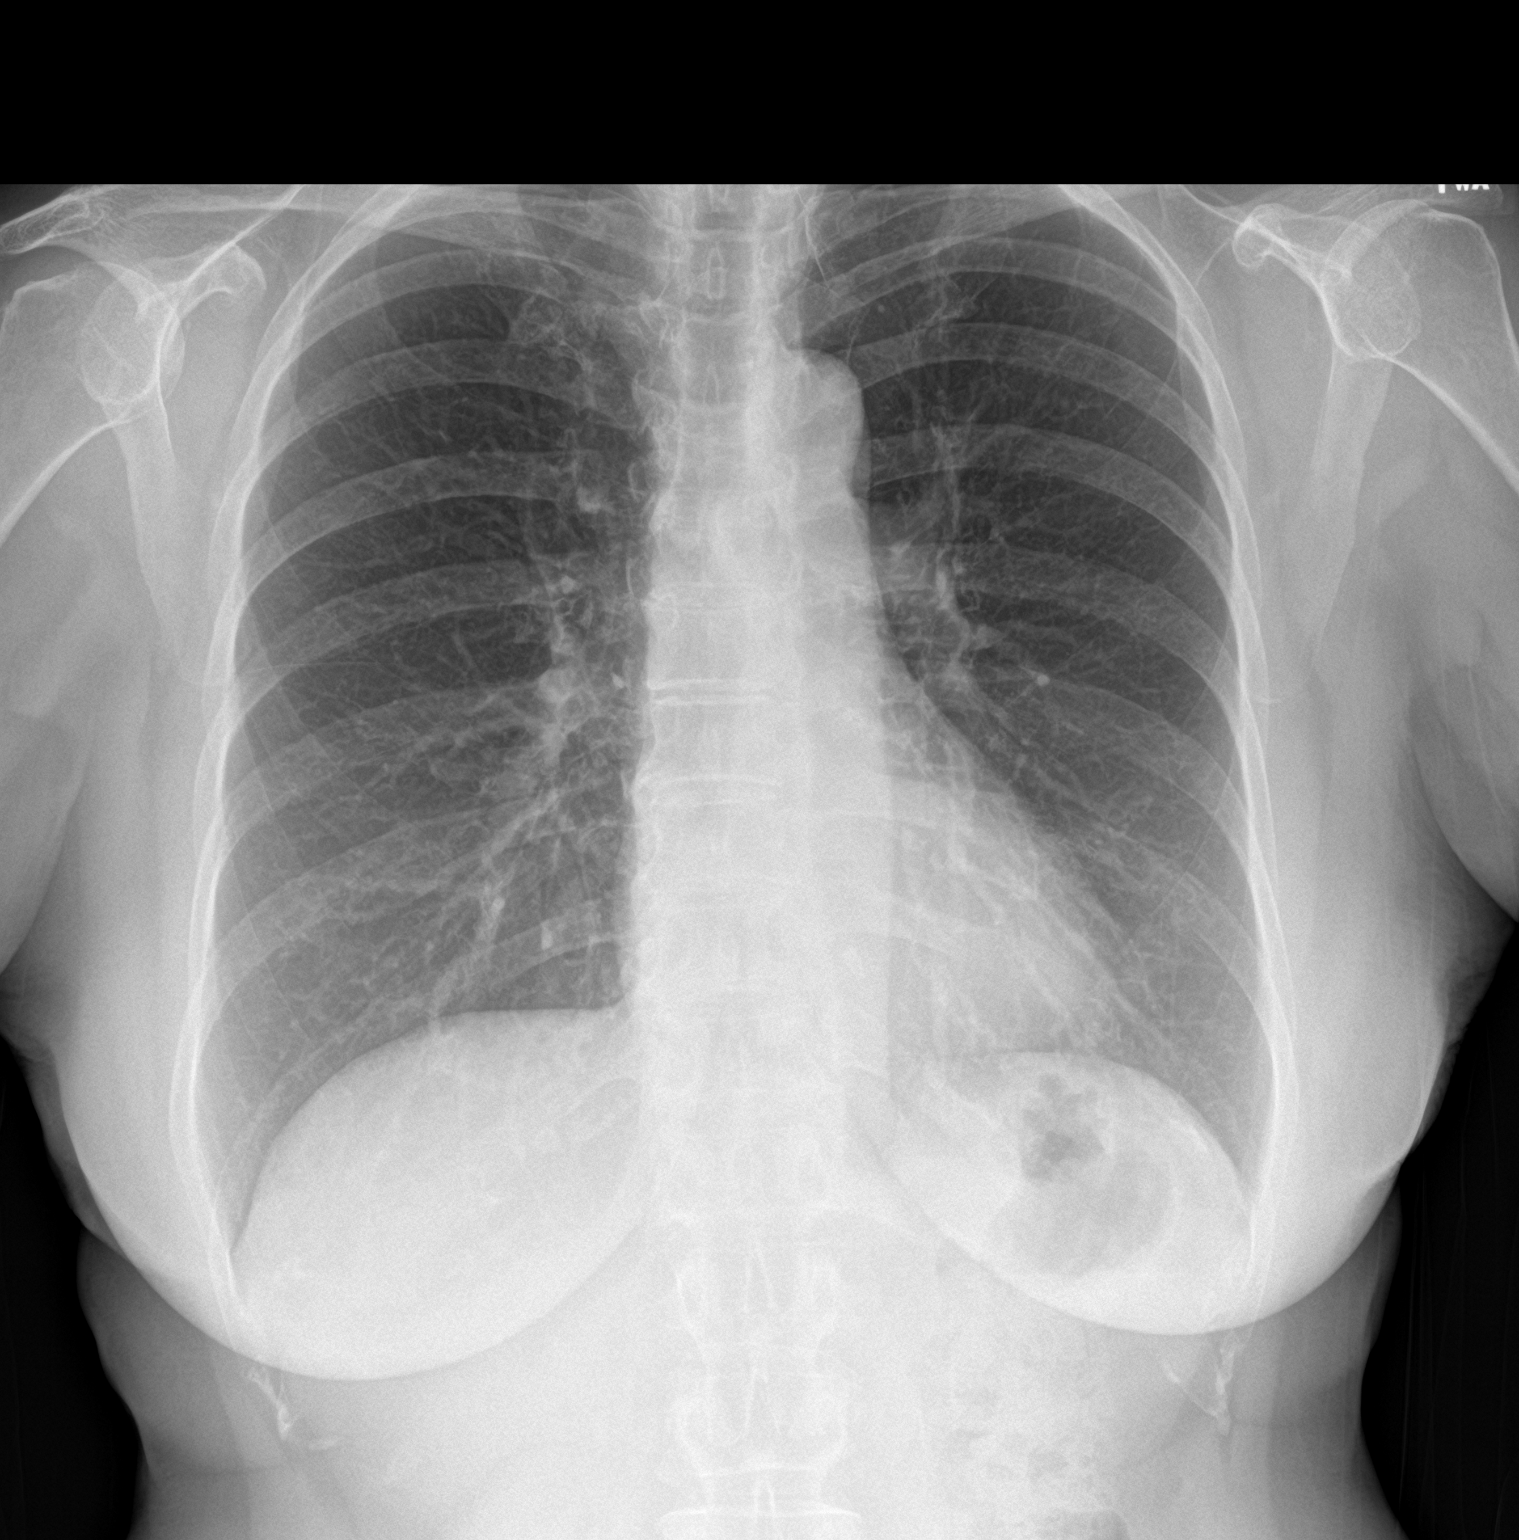

[chest lat]
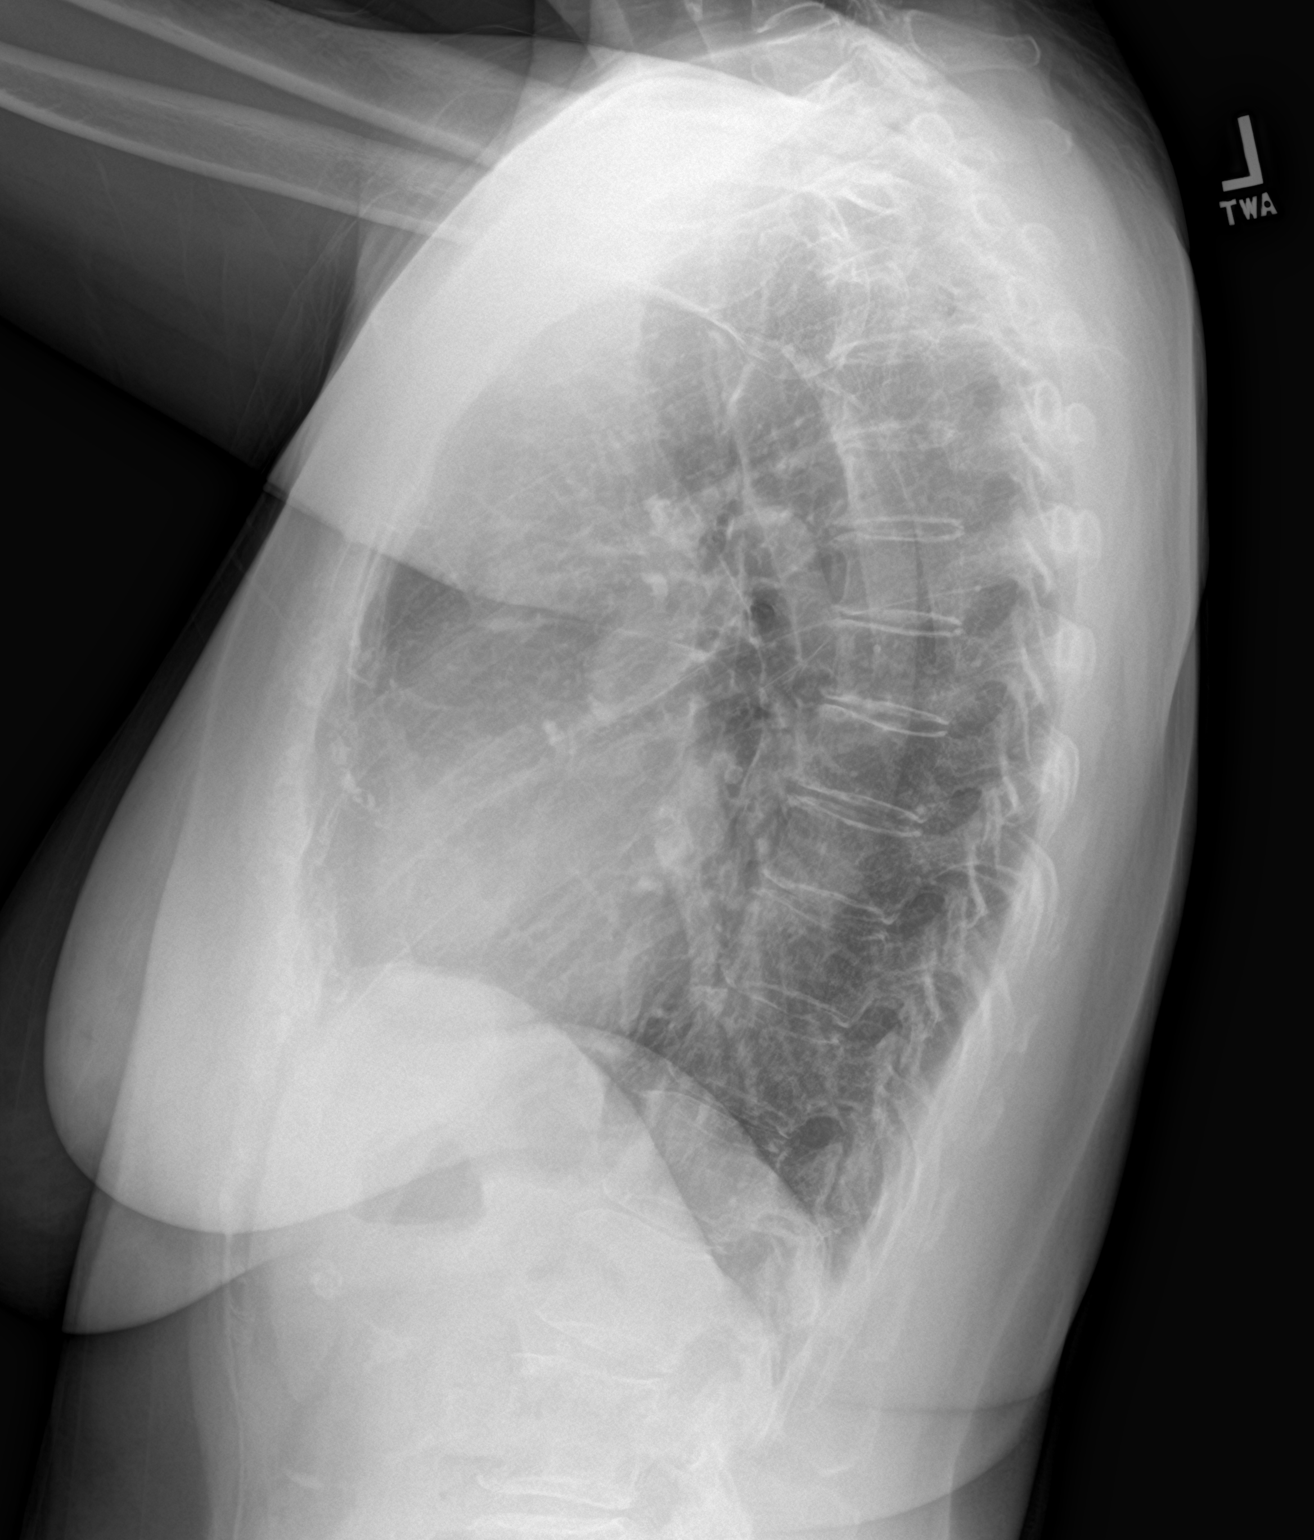

[2 of 2 positions shown; findings below may reference images not displayed]

FINDINGS: The heart size and mediastinal contours are within normal limits.
Both lungs are clear. The visualized skeletal structures are
unremarkable.
IMPRESSION: No active cardiopulmonary disease.

## 2018-02-10 ENCOUNTER — Ambulatory Visit (INDEPENDENT_AMBULATORY_CARE_PROVIDER_SITE_OTHER): Payer: Medicare Other | Admitting: Psychology

## 2018-02-10 DIAGNOSIS — F411 Generalized anxiety disorder: Secondary | ICD-10-CM | POA: Diagnosis not present

## 2018-02-10 DIAGNOSIS — F321 Major depressive disorder, single episode, moderate: Secondary | ICD-10-CM

## 2018-02-21 DIAGNOSIS — F322 Major depressive disorder, single episode, severe without psychotic features: Secondary | ICD-10-CM | POA: Diagnosis not present

## 2018-02-26 ENCOUNTER — Ambulatory Visit (INDEPENDENT_AMBULATORY_CARE_PROVIDER_SITE_OTHER): Payer: Medicare Other | Admitting: Psychology

## 2018-02-26 DIAGNOSIS — F411 Generalized anxiety disorder: Secondary | ICD-10-CM

## 2018-02-26 DIAGNOSIS — F321 Major depressive disorder, single episode, moderate: Secondary | ICD-10-CM

## 2018-03-13 ENCOUNTER — Ambulatory Visit (INDEPENDENT_AMBULATORY_CARE_PROVIDER_SITE_OTHER): Payer: Medicare Other | Admitting: Psychology

## 2018-03-13 DIAGNOSIS — F321 Major depressive disorder, single episode, moderate: Secondary | ICD-10-CM

## 2018-03-13 DIAGNOSIS — F411 Generalized anxiety disorder: Secondary | ICD-10-CM | POA: Diagnosis not present

## 2018-03-24 ENCOUNTER — Ambulatory Visit (INDEPENDENT_AMBULATORY_CARE_PROVIDER_SITE_OTHER): Payer: Medicare Other | Admitting: Psychology

## 2018-03-24 DIAGNOSIS — F411 Generalized anxiety disorder: Secondary | ICD-10-CM | POA: Diagnosis not present

## 2018-03-24 DIAGNOSIS — F321 Major depressive disorder, single episode, moderate: Secondary | ICD-10-CM | POA: Diagnosis not present

## 2018-04-02 DIAGNOSIS — F322 Major depressive disorder, single episode, severe without psychotic features: Secondary | ICD-10-CM | POA: Diagnosis not present

## 2018-04-07 DIAGNOSIS — L649 Androgenic alopecia, unspecified: Secondary | ICD-10-CM | POA: Diagnosis not present

## 2018-04-07 DIAGNOSIS — L299 Pruritus, unspecified: Secondary | ICD-10-CM | POA: Diagnosis not present

## 2018-04-07 DIAGNOSIS — L932 Other local lupus erythematosus: Secondary | ICD-10-CM | POA: Diagnosis not present

## 2018-04-07 DIAGNOSIS — Z5181 Encounter for therapeutic drug level monitoring: Secondary | ICD-10-CM | POA: Diagnosis not present

## 2018-04-07 DIAGNOSIS — M329 Systemic lupus erythematosus, unspecified: Secondary | ICD-10-CM | POA: Diagnosis not present

## 2018-04-09 ENCOUNTER — Ambulatory Visit (INDEPENDENT_AMBULATORY_CARE_PROVIDER_SITE_OTHER): Payer: Medicare Other | Admitting: Psychology

## 2018-04-09 DIAGNOSIS — F321 Major depressive disorder, single episode, moderate: Secondary | ICD-10-CM

## 2018-04-09 DIAGNOSIS — F411 Generalized anxiety disorder: Secondary | ICD-10-CM

## 2018-04-21 ENCOUNTER — Ambulatory Visit (INDEPENDENT_AMBULATORY_CARE_PROVIDER_SITE_OTHER): Payer: Medicare Other | Admitting: Psychology

## 2018-04-21 DIAGNOSIS — F411 Generalized anxiety disorder: Secondary | ICD-10-CM | POA: Diagnosis not present

## 2018-04-21 DIAGNOSIS — F331 Major depressive disorder, recurrent, moderate: Secondary | ICD-10-CM

## 2018-04-28 ENCOUNTER — Ambulatory Visit: Payer: Self-pay | Admitting: Nurse Practitioner

## 2018-05-05 ENCOUNTER — Ambulatory Visit (INDEPENDENT_AMBULATORY_CARE_PROVIDER_SITE_OTHER): Payer: Medicare Other | Admitting: Psychology

## 2018-05-05 DIAGNOSIS — F321 Major depressive disorder, single episode, moderate: Secondary | ICD-10-CM

## 2018-05-05 DIAGNOSIS — F411 Generalized anxiety disorder: Secondary | ICD-10-CM

## 2018-05-14 DIAGNOSIS — F322 Major depressive disorder, single episode, severe without psychotic features: Secondary | ICD-10-CM | POA: Diagnosis not present

## 2018-06-02 DIAGNOSIS — Z803 Family history of malignant neoplasm of breast: Secondary | ICD-10-CM | POA: Diagnosis not present

## 2018-06-02 DIAGNOSIS — Z1231 Encounter for screening mammogram for malignant neoplasm of breast: Secondary | ICD-10-CM | POA: Diagnosis not present

## 2018-06-05 ENCOUNTER — Ambulatory Visit (INDEPENDENT_AMBULATORY_CARE_PROVIDER_SITE_OTHER): Payer: Medicare Other | Admitting: Psychology

## 2018-06-05 DIAGNOSIS — F411 Generalized anxiety disorder: Secondary | ICD-10-CM | POA: Diagnosis not present

## 2018-06-05 DIAGNOSIS — F321 Major depressive disorder, single episode, moderate: Secondary | ICD-10-CM | POA: Diagnosis not present

## 2018-06-06 DIAGNOSIS — H5213 Myopia, bilateral: Secondary | ICD-10-CM | POA: Diagnosis not present

## 2018-06-06 DIAGNOSIS — Z79899 Other long term (current) drug therapy: Secondary | ICD-10-CM | POA: Diagnosis not present

## 2018-06-13 DIAGNOSIS — F322 Major depressive disorder, single episode, severe without psychotic features: Secondary | ICD-10-CM | POA: Diagnosis not present

## 2018-06-16 ENCOUNTER — Ambulatory Visit (INDEPENDENT_AMBULATORY_CARE_PROVIDER_SITE_OTHER): Payer: Medicare Other | Admitting: Psychology

## 2018-06-16 DIAGNOSIS — F411 Generalized anxiety disorder: Secondary | ICD-10-CM

## 2018-06-16 DIAGNOSIS — F321 Major depressive disorder, single episode, moderate: Secondary | ICD-10-CM

## 2018-06-23 DIAGNOSIS — F321 Major depressive disorder, single episode, moderate: Secondary | ICD-10-CM

## 2018-06-23 DIAGNOSIS — F419 Anxiety disorder, unspecified: Secondary | ICD-10-CM | POA: Insufficient documentation

## 2018-07-03 ENCOUNTER — Ambulatory Visit (INDEPENDENT_AMBULATORY_CARE_PROVIDER_SITE_OTHER): Payer: Medicare Other | Admitting: Psychology

## 2018-07-03 DIAGNOSIS — F411 Generalized anxiety disorder: Secondary | ICD-10-CM

## 2018-07-03 DIAGNOSIS — F321 Major depressive disorder, single episode, moderate: Secondary | ICD-10-CM | POA: Diagnosis not present

## 2018-07-10 DIAGNOSIS — Z85528 Personal history of other malignant neoplasm of kidney: Secondary | ICD-10-CM | POA: Diagnosis not present

## 2018-07-10 DIAGNOSIS — Z0000 Encounter for general adult medical examination without abnormal findings: Secondary | ICD-10-CM | POA: Diagnosis not present

## 2018-07-10 DIAGNOSIS — E559 Vitamin D deficiency, unspecified: Secondary | ICD-10-CM | POA: Diagnosis not present

## 2018-07-10 DIAGNOSIS — F338 Other recurrent depressive disorders: Secondary | ICD-10-CM | POA: Diagnosis not present

## 2018-07-10 DIAGNOSIS — E785 Hyperlipidemia, unspecified: Secondary | ICD-10-CM | POA: Diagnosis not present

## 2018-07-10 DIAGNOSIS — L932 Other local lupus erythematosus: Secondary | ICD-10-CM | POA: Diagnosis not present

## 2018-07-10 DIAGNOSIS — Z1159 Encounter for screening for other viral diseases: Secondary | ICD-10-CM | POA: Diagnosis not present

## 2018-07-10 DIAGNOSIS — M8580 Other specified disorders of bone density and structure, unspecified site: Secondary | ICD-10-CM | POA: Diagnosis not present

## 2018-07-14 ENCOUNTER — Ambulatory Visit (INDEPENDENT_AMBULATORY_CARE_PROVIDER_SITE_OTHER): Payer: Medicare Other | Admitting: Psychiatry

## 2018-07-14 ENCOUNTER — Ambulatory Visit (INDEPENDENT_AMBULATORY_CARE_PROVIDER_SITE_OTHER): Payer: Medicare Other | Admitting: Psychology

## 2018-07-14 ENCOUNTER — Encounter: Payer: Self-pay | Admitting: Psychiatry

## 2018-07-14 VITALS — BP 125/72 | HR 64 | Ht 60.0 in

## 2018-07-14 DIAGNOSIS — F320 Major depressive disorder, single episode, mild: Secondary | ICD-10-CM

## 2018-07-14 DIAGNOSIS — F411 Generalized anxiety disorder: Secondary | ICD-10-CM | POA: Diagnosis not present

## 2018-07-14 DIAGNOSIS — F321 Major depressive disorder, single episode, moderate: Secondary | ICD-10-CM

## 2018-07-14 MED ORDER — DEPLIN 15 15-90.314 MG PO CAPS: 15.0000 mg | ORAL_CAPSULE | Freq: Once | ORAL | Status: AC

## 2018-07-14 NOTE — Patient Instructions (Signed)
Take one Deplin 15 mg daily with or without food. Can take morning or night.

## 2018-07-14 NOTE — Progress Notes (Signed)
Andrea Burke [id number] 24-Mar-1946 72 y.o.  Subjective:   Patient ID:  Andrea Burke 72 y.o. (DOB 26-Jul-1946) female.  Chief Complaint:  Chief Complaint  Patient presents with  . Depression    HPI Andrea Burke presents to the office today for follow-up of depression and anxiety. She reports that she stopped Buproprion due to severe tremors. Reports that she had Burke recent episode of falling out of bed when having vivid dreams- "I'm running, I'm fighting." Vivid dreams seem to happen right before awakening. She reports some nightmares. Reports that nightmares and vivid dreams are not occurring every night. Reports that she tried taking Viibryd earlier, around 7:30 pm, and noticed that she felt sleepier earlier but no change in dreams. Denies difficulty falling asleep. Wakes up at least once Burke night, usually to go to the bathroom. May not get sleepy until 1 am if she does not get home from part-time job until 10:30 pm and then sleeps later. "I'm not down in the dumps" but mood remains lower than she would like. Denies recent irritability and that anxiety has been lower as well due to decreased stressors. Appetite is decreased and reports that she does not care about food. Energy and motivation are both low. Reports that concentration requires effort and "only when I have too." Denies SI.   Saw Bambi Cottle, LCSW earlier today for therapy.    Medications: I have reviewed the patient's current medications. Reports vivid dreams. Denies any worsening in vivid dreams at 20 mg. Denies other side effects.   Allergies:  Allergies  Allergen Reactions  . Doxycycline Calcium   . Penicillins     Past Medical History:  Diagnosis Date  . Cancer (HCC)    KIDNEY  . Discoid lupus   . Endometriosis   . Headache(784.0)   . Insomnia   . Kidney disease   . Lupus ([place])   . Osteopenia   . Urinary incontinence     Past Surgical History:  Procedure Laterality Date  . PELVIC  LAPAROSCOPY     DIAG LASER LAP-ENDOMETRIOSIS  . REMOVAL OF LEFT KIDNEY  2008    Family History  Problem Relation Age of Onset  . Hypertension Mother   . Hyperlipidemia Mother   . Tremor Mother   . Breast cancer Sister   . Hyperlipidemia Father   . Hypertension Father   . Dementia Father   . Tremor Father   . Cancer Sister   . Cancer Other     Social History   Socioeconomic History  . Marital status: Married    Spouse name: Not on file  . Number of children: Not on file  . Years of education: Not on file  . Highest education level: Not on file  Occupational History  . Not on file  Social Needs  . Financial resource strain: Not on file  . Food insecurity:    Worry: Not on file    Inability: Not on file  . Transportation needs:    Medical: Not on file    Non-medical: Not on file  Tobacco Use  . Smoking status: Former Research scientist (life sciences)  . Smokeless tobacco: Never Used  Substance and Sexual Activity  . Alcohol use: Yes    Comment: rare  . Drug use: Not on file  . Sexual activity: Never    Birth control/protection: Post-menopausal  Lifestyle  . Physical activity:    Days per week: Not on file    Minutes per session: Not  on file  . Stress: Not on file  Relationships  . Social connections:    Talks on phone: Not on file    Gets together: Not on file    Attends religious service: Not on file    Active member of club or organization: Not on file    Attends meetings of clubs or organizations: Not on file    Relationship status: Not on file  . Intimate partner violence:    Fear of current or ex partner: Not on file    Emotionally abused: Not on file    Physically abused: Not on file    Forced sexual activity: Not on file  Other Topics Concern  . Not on file  Social History Narrative  . Not on file    Past Medical History, Surgical history, Social history, and Family history were reviewed and updated as appropriate.   Please see review of systems for further details on  the patient's review from today.   Review of Systems:  Review of Systems  Constitutional: Positive for fatigue.  Musculoskeletal: Negative for gait problem.  Neurological: Negative for tremors.  Psychiatric/Behavioral: Positive for dysphoric mood and sleep disturbance. Negative for suicidal ideas.    Objective:   Physical Exam:  BP 125/72   Pulse 64   Ht 5' (1.524 m)   BMI 26.05 kg/m   Physical Exam  Constitutional: She is oriented to person, place, and time. She appears well-developed.  Musculoskeletal: She exhibits no deformity.  Neurological: She is alert and oriented to person, place, and time.  Psychiatric: Her speech is normal. Judgment and thought content normal. She is slowed. Cognition and memory are normal. She exhibits Burke depressed mood. She expresses no homicidal and no suicidal ideation. She expresses no suicidal plans and no homicidal plans.  Affect is constricted .Speech is soft    Lab Review:     Component Value Date/Time   NA 138 07/31/2014 1222   K 4.2 07/31/2014 1222   CL 107 07/31/2014 1222   CO2 24 07/31/2014 1222   GLUCOSE 88 07/31/2014 1222   BUN 11 07/31/2014 1222   CREATININE 0.72 07/31/2014 1222   CALCIUM 9.5 07/31/2014 1222   PROT 7.5 07/31/2014 1222   ALBUMIN 4.3 07/31/2014 1222   AST 21 07/31/2014 1222   ALT 17 07/31/2014 1222   ALKPHOS 54 07/31/2014 1222   BILITOT 0.6 07/31/2014 1222       Component Value Date/Time   WBC 6.4 07/31/2014 1222   RBC 5.04 07/31/2014 1222   HGB 14.2 07/31/2014 1222   HCT 43.7 07/31/2014 1222   MCV 86.6 07/31/2014 1222   MCH 28.1 07/31/2014 1222   MCHC 32.5 07/31/2014 1222     Assessment: Plan:   Patient seen for 30 minutes and greater than 50% of session spent counseling patient regarding potential benefits, risks, and side effects of Deplin.  Discussed that Deplin may be helpful for treatment resistant depression, as well as her energy and concentration.  Provided patient with samples for 1 month  supply of Deplin for trial to determine if this is helpful for target signs and symptoms.  Encourage patient to contact office if she would like prescription sent.  Discussed that Deplin often is not covered by insurance, however Burke generic version is available and it is also possible to order brand-name Deplin from brand direct.  Recommended continuing Viibryd 20 mg daily and patient reports that she does not need Burke refill at this time.  Recommend patient continue  therapy with Bambi caudle, LCSW.  Patient to follow-up with this provider in 4 to 6 weeks or sooner if clinically indicated. Major depressive disorder, single episode, mild ([place]) - Plan: L-Methylfolate-Algae (DEPLIN 15) 15-90.314 MG CAPS  Please see After Visit Summary for patient specific instructions.  Future Appointments  Date Time Provider [place]  07/28/2018 10:00 AM Cottle, Lucious Groves, LCSW [facility] None  08/11/2018 10:00 AM Cottle, Bambi G, LCSW [facility] None  08/21/2018  9:30 AM Thayer Headings, PMHNP [facility] None    No orders of the defined types were placed in this encounter.     -------------------------------

## 2018-07-28 ENCOUNTER — Ambulatory Visit (INDEPENDENT_AMBULATORY_CARE_PROVIDER_SITE_OTHER): Payer: Medicare Other | Admitting: Psychology

## 2018-07-28 DIAGNOSIS — F321 Major depressive disorder, single episode, moderate: Secondary | ICD-10-CM

## 2018-07-28 DIAGNOSIS — F411 Generalized anxiety disorder: Secondary | ICD-10-CM

## 2018-08-06 DIAGNOSIS — F321 Major depressive disorder, single episode, moderate: Secondary | ICD-10-CM | POA: Insufficient documentation

## 2018-08-11 ENCOUNTER — Ambulatory Visit: Payer: Medicare Other | Admitting: Psychology

## 2018-08-21 ENCOUNTER — Encounter: Payer: Self-pay | Admitting: Psychiatry

## 2018-08-21 ENCOUNTER — Ambulatory Visit (INDEPENDENT_AMBULATORY_CARE_PROVIDER_SITE_OTHER): Payer: Medicare Other | Admitting: Psychiatry

## 2018-08-21 DIAGNOSIS — F320 Major depressive disorder, single episode, mild: Secondary | ICD-10-CM

## 2018-08-21 DIAGNOSIS — F419 Anxiety disorder, unspecified: Secondary | ICD-10-CM

## 2018-08-21 MED ORDER — DEPLIN 15 15-90.314 MG PO CAPS: 15.0000 mg | ORAL_CAPSULE | Freq: Every day | ORAL | 0 refills | Status: DC

## 2018-08-21 MED ORDER — VILAZODONE HCL 40 MG PO TABS: ORAL_TABLET | ORAL | 0 refills | Status: DC

## 2018-08-21 NOTE — Progress Notes (Signed)
Andrea Burke [id number] Jul 17, 1946 72 y.o.  Subjective:   Patient ID:  Andrea Burke is a 72 y.o. (DOB 01/20/1946) female.  Chief Complaint:  Chief Complaint  Patient presents with  . Depression  . Anxiety    HPI [name] presents to the office today for follow-up of mood and anxiety. She reports that her mood is "pretty even, not happy. Not up and down." Reports that she had a few incidents of frustration and irritability over "something small" such as not being able to find her keys. She reports that motivation remains low and that her energy is "probably low."  She reports that there may have been a slight improvement with Deplin- "thought it helped even me out." Energy may have been slightly improved. Denies any significant change in concentration. Reports that anxiety has improved and it is "not bothering me as much." She reports that she may be sleeping "too much." Reports occasional middle of the night awakening and is not having as much difficulty returning to sleep. She reports that she continues to have vivid dreams and sometimes has bad dreams. She denies any improvement in dreams. Estimates sleeping 7-9 hours a night with interruptions. Her appetite remains low and there are days that she is eating only one meal a day. Reports that she has not taken on anything new and "just trying to get through each day." Denies SI.   Past medication trials: Remeron- vivid dreams, increased appetite Lexapro-nightmares Sertraline-agitation, insomnia, impaired concentration Paxil-intolerable side effects Fluoxetine-intolerable side effects Viibryd Hydroxyzine Deplin  Review of Systems:  Review of Systems  Musculoskeletal: Positive for arthralgias and myalgias. Negative for gait problem.  Neurological: Positive for tremors.  Psychiatric/Behavioral:       Please refer to HPI    Medications: I have reviewed the patient's current medications.  Current Outpatient  Medications  Medication Sig Dispense Refill  . ALPRAZolam (XANAX) 0.25 MG tablet Take 0.25 mg by mouth daily as needed.     [name] Kitchen aspirin 81 MG tablet Take 81 mg by mouth daily.    [name] Kitchen BUTALBITAL-ACETAMINOPHEN PO Take by mouth.    . Calcium Carbonate-Vitamin D (CALTRATE 600+D PO) Take by mouth.    . fish oil-omega-3 fatty acids 1000 MG capsule Take 2 g by mouth daily.    . hydroxychloroquine (PLAQUENIL) 200 MG tablet Take by mouth daily.    [name] Kitchen HYDROXYZINE HCL PO Take by mouth.    [name] Kitchen L-Methylfolate-Algae (DEPLIN 15) 15-90.314 MG CAPS Take 15 mg by mouth daily. 30 capsule 0  . Multiple Vitamins-Minerals (CENTRUM PO) Take by mouth.    . triamcinolone cream (KENALOG) 0.1 % Apply topically 2 (two) times daily. 30 g 0  . Vilazodone HCl (VIIBRYD) 20 MG TABS Take 20 mg by mouth daily.     . Vilazodone HCl (VIIBRYD) 40 MG TABS Take 1/2-1 tab po qd 90 tablet 0   No current facility-administered medications for this visit.     Medication Side Effects: Other: Vivid dreams, possible weight gain.  Allergies:  Allergies  Allergen Reactions  . Doxycycline Calcium   . Penicillins     Past Medical History:  Diagnosis Date  . Cancer (HCC)    KIDNEY  . Discoid lupus   . Endometriosis   . Headache(784.0)   . Insomnia   . Kidney disease   . Lupus ([place])   . Osteopenia   . Urinary incontinence     Family History  Problem Relation Age of Onset  . Hypertension Mother   .  Hyperlipidemia Mother   . Tremor Mother   . Breast cancer Sister   . Hyperlipidemia Father   . Hypertension Father   . Dementia Father   . Tremor Father   . Cancer Sister   . Cancer Other     Social History   Socioeconomic History  . Marital status: Married    Spouse name: Not on file  . Number of children: Not on file  . Years of education: Not on file  . Highest education level: Not on file  Occupational History  . Not on file  Social Needs  . Financial resource strain: Not on file  . Food insecurity:    Worry:  Not on file    Inability: Not on file  . Transportation needs:    Medical: Not on file    Non-medical: Not on file  Tobacco Use  . Smoking status: Former Research scientist (life sciences)  . Smokeless tobacco: Never Used  Substance and Sexual Activity  . Alcohol use: Yes    Comment: rare  . Drug use: Not on file  . Sexual activity: Never    Birth control/protection: Post-menopausal  Lifestyle  . Physical activity:    Days per week: Not on file    Minutes per session: Not on file  . Stress: Not on file  Relationships  . Social connections:    Talks on phone: Not on file    Gets together: Not on file    Attends religious service: Not on file    Active member of club or organization: Not on file    Attends meetings of clubs or organizations: Not on file    Relationship status: Not on file  . Intimate partner violence:    Fear of current or ex partner: Not on file    Emotionally abused: Not on file    Physically abused: Not on file    Forced sexual activity: Not on file  Other Topics Concern  . Not on file  Social History Narrative  . Not on file    Past Medical History, Surgical history, Social history, and Family history were reviewed and updated as appropriate.   Please see review of systems for further details on the patient's review from today.   Objective:   Physical Exam:  There were no vitals taken for this visit.  Physical Exam  Constitutional: She is oriented to person, place, and time. She appears well-developed. No distress.  Musculoskeletal: She exhibits no deformity.  Neurological: She is alert and oriented to person, place, and time. Coordination normal.  Psychiatric: Her speech is normal and behavior is normal. Judgment and thought content normal. Her mood appears not anxious. Her affect is not angry, not blunt, not labile and not inappropriate. Cognition and memory are normal. She exhibits a depressed mood. She expresses no homicidal and no suicidal ideation. She expresses no  suicidal plans and no homicidal plans.  Affect is constricted. Insight intact. No auditory or visual hallucinations. No delusions.     Lab Review:     Component Value Date/Time   NA 138 07/31/2014 1222   K 4.2 07/31/2014 1222   CL 107 07/31/2014 1222   CO2 24 07/31/2014 1222   GLUCOSE 88 07/31/2014 1222   BUN 11 07/31/2014 1222   CREATININE 0.72 07/31/2014 1222   CALCIUM 9.5 07/31/2014 1222   PROT 7.5 07/31/2014 1222   ALBUMIN 4.3 07/31/2014 1222   AST 21 07/31/2014 1222   ALT 17 07/31/2014 1222   ALKPHOS 54 07/31/2014  1222   BILITOT 0.6 07/31/2014 1222       Component Value Date/Time   WBC 6.4 07/31/2014 1222   RBC 5.04 07/31/2014 1222   HGB 14.2 07/31/2014 1222   HCT 43.7 07/31/2014 1222   MCV 86.6 07/31/2014 1222   MCH 28.1 07/31/2014 1222   MCHC 32.5 07/31/2014 1222    No results found for: POCLITH, LITHIUM   No results found for: PHENYTOIN, PHENOBARB, VALPROATE, CBMZ   .res Assessment: Plan:   Patient seen for 30 minutes and greater than 50% of visit spent counseling patient regarding treatment options to include further increase titration of Viibryd since she has had a partial response at lower doses and has not experienced any worsening in possible side effects with increased doses.  Discussed another option would be to continue trial of Deplin for another month to determine if this has been effective since patient reports that it may have possibly been helpful for her mood, energy, and concentration.  Patient reports that she would prefer to continue Deplin trial at this time and then consider possible increase in Viibryd in the future if needed.  Will continue Viibryd 20 mg daily for mood and anxiety.  Will send prescription for Viibryd 40 mg 1/2 to 1 tablet daily.  Recommend continuing to see Caroline Sauger, LCSW for therapy. Major depressive disorder, single episode, mild (HCC) - Plan: L-Methylfolate-Algae (DEPLIN 15) 15-90.314 MG CAPS, Vilazodone HCl (VIIBRYD) 40  MG TABS  Anxiety disorder, unspecified type - Plan: Vilazodone HCl (VIIBRYD) 40 MG TABS  Please see After Visit Summary for patient specific instructions.  Future Appointments  Date Time Provider [place]  09/08/2018  9:00 AM Cottle, Lucious Groves, LCSW [facility] None  09/18/2018  9:30 AM Thayer Headings, PMHNP [facility] None    No orders of the defined types were placed in this encounter.     -------------------------------

## 2018-08-25 ENCOUNTER — Ambulatory Visit: Payer: Medicare Other | Admitting: Psychology

## 2018-09-08 ENCOUNTER — Ambulatory Visit (INDEPENDENT_AMBULATORY_CARE_PROVIDER_SITE_OTHER): Payer: Medicare Other | Admitting: Psychology

## 2018-09-08 DIAGNOSIS — F321 Major depressive disorder, single episode, moderate: Secondary | ICD-10-CM

## 2018-09-08 DIAGNOSIS — F411 Generalized anxiety disorder: Secondary | ICD-10-CM | POA: Diagnosis not present

## 2018-09-18 ENCOUNTER — Ambulatory Visit (INDEPENDENT_AMBULATORY_CARE_PROVIDER_SITE_OTHER): Payer: Medicare Other | Admitting: Psychiatry

## 2018-09-18 ENCOUNTER — Encounter: Payer: Self-pay | Admitting: Psychiatry

## 2018-09-18 DIAGNOSIS — F320 Major depressive disorder, single episode, mild: Secondary | ICD-10-CM

## 2018-09-18 DIAGNOSIS — F99 Mental disorder, not otherwise specified: Secondary | ICD-10-CM

## 2018-09-18 DIAGNOSIS — F5105 Insomnia due to other mental disorder: Secondary | ICD-10-CM | POA: Diagnosis not present

## 2018-09-18 DIAGNOSIS — F419 Anxiety disorder, unspecified: Secondary | ICD-10-CM

## 2018-09-18 MED ORDER — L-METHYLFOLATE 15 MG PO TABS: 15.0000 mg | ORAL_TABLET | Freq: Every day | ORAL | 5 refills | Status: AC

## 2018-09-18 MED ORDER — L-METHYLFOLATE 15 MG PO TABS: 15.0000 mg | ORAL_TABLET | Freq: Every day | ORAL | 5 refills | Status: DC

## 2018-09-18 NOTE — Progress Notes (Signed)
Andrea Burke [id number] Jul 02, 1946 72 y.o.  Subjective:   Patient ID:  Andrea Burke is a 72 y.o. (DOB October 29, 1945) female.  Chief Complaint:  Chief Complaint  Patient presents with  . Depression  . Anxiety  . Insomnia    HPI Andrea Burke presents to the office today for follow-up of depression, anxiety, and insomnia. She feels that Deplin samples were helpful for her mood and energy, but is concerned about Reports that her mood is typically better in the summer and sunny days. She reports that motivation and energy are lower than she would. Describes mood as "ok" with some continued sadness but not as severe compared to the past. She reports that her anxiety is "manageable." Reports that she has been more forgetful recently and has locked herself out of her car 3-4 times in the last 3 months and then gets upset and tearful when this happens and thinks, "what else is going to go wrong? Why me?" Reports that she is occasionally forgetting the names of associates at work that she does not see on a regular basis. Reports concentration is "about the same" and is difficult when she does not have the motivation for certain tasks. Reports that some nights she falls asleep immediately and other nights it takes a few hours to fall asleep. Reports that she is dreaming most nights. Reports dreams are vivid at times "but not horrible dreams." Reports that her appetite is the same and is "not too good" and is not interested in food. Reports that her weight is increasing despite decreased food intake. Denies SI.   Reports that she has not been using Xanax prn and has possibly only taken it once.  Continues to see Caroline Sauger, LCSW for therapy.   Past medication trials: Remeron- vivid dreams, increased appetite Lexapro-nightmares Sertraline-agitation, insomnia, impaired concentration Paxil-intolerable side effects Fluoxetine-intolerable side effects Viibryd Hydroxyzine Deplin  Review  of Systems:  Review of Systems  HENT:       Recent cold s/s.  Respiratory: Positive for cough.   Gastrointestinal: Positive for diarrhea.  Musculoskeletal: Negative for gait problem.  Neurological: Positive for tremors.  Psychiatric/Behavioral:       Please refer to HPI    Medications: I have reviewed the patient's current medications.  Current Outpatient Medications  Medication Sig Dispense Refill  . Calcium Carbonate-Vitamin D (CALTRATE 600+D PO) Take by mouth.    . fish oil-omega-3 fatty acids 1000 MG capsule Take 2 g by mouth daily.    . hydroxychloroquine (PLAQUENIL) 200 MG tablet Take by mouth daily.    [name] Kitchen L-Methylfolate-Algae (DEPLIN 15) 15-90.314 MG CAPS Take 15 mg by mouth daily. 30 capsule 0  . Multiple Vitamins-Minerals (CENTRUM PO) Take by mouth.    . triamcinolone cream (KENALOG) 0.1 % Apply topically 2 (two) times daily. 30 g 0  . Vilazodone HCl (VIIBRYD) 40 MG TABS Take 1/2-1 tab po qd 90 tablet 0  . ALPRAZolam (XANAX) 0.25 MG tablet Take 0.25 mg by mouth daily as needed.     [name] Kitchen aspirin 81 MG tablet Take 81 mg by mouth daily.    [name] Kitchen BUTALBITAL-ACETAMINOPHEN PO Take by mouth.    [name] Kitchen HYDROXYZINE HCL PO Take by mouth.    [name] Kitchen L-Methylfolate 15 MG TABS Take 1 tablet (15 mg total) by mouth daily. 30 tablet 5  . Vilazodone HCl (VIIBRYD) 20 MG TABS Take 20 mg by mouth daily.      No current facility-administered medications for this visit.  Medication Side Effects: Other: Increased dreams/vivid dreams, wt gain.   Allergies:  Allergies  Allergen Reactions  . Doxycycline Calcium   . Penicillins     Past Medical History:  Diagnosis Date  . Cancer (HCC)    KIDNEY  . Discoid lupus   . Endometriosis   . Headache(784.0)   . Insomnia   . Kidney disease   . Lupus ([place])   . Osteopenia   . Urinary incontinence     Family History  Problem Relation Age of Onset  . Hypertension Mother   . Hyperlipidemia Mother   . Tremor Mother   . Breast cancer Sister   .  Hyperlipidemia Father   . Hypertension Father   . Dementia Father   . Tremor Father   . Cancer Sister   . Cancer Other     Social History   Socioeconomic History  . Marital status: Married    Spouse name: Not on file  . Number of children: Not on file  . Years of education: Not on file  . Highest education level: Not on file  Occupational History  . Not on file  Social Needs  . Financial resource strain: Not on file  . Food insecurity:    Worry: Not on file    Inability: Not on file  . Transportation needs:    Medical: Not on file    Non-medical: Not on file  Tobacco Use  . Smoking status: Former Research scientist (life sciences)  . Smokeless tobacco: Never Used  Substance and Sexual Activity  . Alcohol use: Yes    Comment: rare  . Drug use: Not on file  . Sexual activity: Never    Birth control/protection: Post-menopausal  Lifestyle  . Physical activity:    Days per week: Not on file    Minutes per session: Not on file  . Stress: Not on file  Relationships  . Social connections:    Talks on phone: Not on file    Gets together: Not on file    Attends religious service: Not on file    Active member of club or organization: Not on file    Attends meetings of clubs or organizations: Not on file    Relationship status: Not on file  . Intimate partner violence:    Fear of current or ex partner: Not on file    Emotionally abused: Not on file    Physically abused: Not on file    Forced sexual activity: Not on file  Other Topics Concern  . Not on file  Social History Narrative  . Not on file    Past Medical History, Surgical history, Social history, and Family history were reviewed and updated as appropriate.   Please see review of systems for further details on the patient's review from today.   Objective:   Physical Exam:  There were no vitals taken for this visit.  Physical Exam Constitutional:      General: She is not in acute distress.    Appearance: She is well-developed.   Musculoskeletal:        General: No deformity.  Neurological:     Mental Status: She is alert and oriented to person, place, and time.     Coordination: Coordination normal.  Psychiatric:        Mood and Affect: Mood is anxious. Affect is blunt. Affect is not labile, angry or inappropriate.        Speech: Speech normal.        Behavior:  Behavior normal.        Thought Content: Thought content normal. Thought content does not include homicidal or suicidal ideation. Thought content does not include homicidal or suicidal plan.        Judgment: Judgment normal.     Comments: Insight intact. No auditory or visual hallucinations. No delusions.      Lab Review:     Component Value Date/Time   NA 138 07/31/2014 1222   K 4.2 07/31/2014 1222   CL 107 07/31/2014 1222   CO2 24 07/31/2014 1222   GLUCOSE 88 07/31/2014 1222   BUN 11 07/31/2014 1222   CREATININE 0.72 07/31/2014 1222   CALCIUM 9.5 07/31/2014 1222   PROT 7.5 07/31/2014 1222   ALBUMIN 4.3 07/31/2014 1222   AST 21 07/31/2014 1222   ALT 17 07/31/2014 1222   ALKPHOS 54 07/31/2014 1222   BILITOT 0.6 07/31/2014 1222       Component Value Date/Time   WBC 6.4 07/31/2014 1222   RBC 5.04 07/31/2014 1222   HGB 14.2 07/31/2014 1222   HCT 43.7 07/31/2014 1222   MCV 86.6 07/31/2014 1222   MCH 28.1 07/31/2014 1222   MCHC 32.5 07/31/2014 1222    No results found for: POCLITH, LITHIUM   No results found for: PHENYTOIN, PHENOBARB, VALPROATE, CBMZ   .res Assessment: Plan:   Patient reports that she would prefer not to increase Viibryd due to concerns about possible side effects.  Reports that she recently received 90-day supply of Viibryd and would like to continue for a longer period of time to determine response.  Reports that she may be interested in considering another medication in the future if no significant improvement in mood signs and symptoms.  May consider Trintellix as future option. Continue Viibryd 40 mg one half tab  p.o. daily for depression and anxiety Continue l-methylfolate 15 mg p.o. daily for augmentation of depression.  Major depressive disorder, single episode, mild (HCC) - Chronic with some recent improvement - Plan: L-Methylfolate 15 MG TABS, DISCONTINUED: L-Methylfolate 15 MG TABS  Anxiety disorder, unspecified type - Improved  Insomnia due to other mental disorder - Some recent improvement  Please see After Visit Summary for patient specific instructions.  Future Appointments  Date Time Provider [place]  10/06/2018  8:00 AM Cottle, Lucious Groves, LCSW [facility] None  11/19/2018 10:00 AM Thayer Headings, PMHNP [facility] None    No orders of the defined types were placed in this encounter.     -------------------------------

## 2018-09-23 ENCOUNTER — Telehealth: Payer: Self-pay | Admitting: Psychiatry

## 2018-09-23 DIAGNOSIS — Z79899 Other long term (current) drug therapy: Secondary | ICD-10-CM

## 2018-09-23 DIAGNOSIS — R5383 Other fatigue: Secondary | ICD-10-CM

## 2018-09-23 NOTE — Telephone Encounter (Signed)
Please let patient know that labs were received from PCP.  Would like to order TSH to check thyroid function, CBC and vitamin B12 levels to rule out possible medical causes for depression and low energy.  Lab order has been sent to [facility].  Request that she have labs drawn.

## 2018-09-25 NOTE — Telephone Encounter (Signed)
Left voice mail to call back 

## 2018-09-26 NOTE — Telephone Encounter (Signed)
Spoke with Pt. Aware of Lab Tests to be done. Given information on Location of [facility].

## 2018-09-29 DIAGNOSIS — Z79899 Other long term (current) drug therapy: Secondary | ICD-10-CM | POA: Diagnosis not present

## 2018-09-29 DIAGNOSIS — R5383 Other fatigue: Secondary | ICD-10-CM | POA: Diagnosis not present

## 2018-09-30 LAB — CBC WITH DIFFERENTIAL/PLATELET
Absolute Monocytes: 374 cells/uL (ref 200–950)
Basophils Absolute: 30 cells/uL (ref 0–200)
Basophils Relative: 0.8 %
EOS PCT: 3.3 %
Eosinophils Absolute: 122 cells/uL (ref 15–500)
HCT: 41.8 % (ref 35.0–45.0)
Hemoglobin: 14.1 g/dL (ref 11.7–15.5)
Lymphs Abs: 1887 cells/uL (ref 850–3900)
MCH: 29 pg (ref 27.0–33.0)
MCHC: 33.7 g/dL (ref 32.0–36.0)
MCV: 86 fL (ref 80.0–100.0)
MONOS PCT: 10.1 %
MPV: 10.1 fL (ref 7.5–12.5)
NEUTROS PCT: 34.8 %
Neutro Abs: 1288 cells/uL — ABNORMAL LOW (ref 1500–7800)
Platelets: 183 10*3/uL (ref 140–400)
RBC: 4.86 10*6/uL (ref 3.80–5.10)
RDW: 12.2 % (ref 11.0–15.0)
Total Lymphocyte: 51 %
WBC: 3.7 10*3/uL — AB (ref 3.8–10.8)

## 2018-09-30 LAB — TSH: TSH: 1.45 m[IU]/L (ref 0.40–4.50)

## 2018-09-30 LAB — VITAMIN B12: VITAMIN B 12: 814 pg/mL (ref 200–1100)

## 2018-10-06 ENCOUNTER — Ambulatory Visit (INDEPENDENT_AMBULATORY_CARE_PROVIDER_SITE_OTHER): Payer: Medicare Other | Admitting: Psychology

## 2018-10-06 DIAGNOSIS — F411 Generalized anxiety disorder: Secondary | ICD-10-CM

## 2018-10-06 DIAGNOSIS — F321 Major depressive disorder, single episode, moderate: Secondary | ICD-10-CM | POA: Diagnosis not present

## 2018-10-13 DIAGNOSIS — J029 Acute pharyngitis, unspecified: Secondary | ICD-10-CM | POA: Diagnosis not present

## 2018-10-13 DIAGNOSIS — J0100 Acute maxillary sinusitis, unspecified: Secondary | ICD-10-CM | POA: Diagnosis not present

## 2018-10-23 ENCOUNTER — Telehealth: Payer: Self-pay | Admitting: Psychiatry

## 2018-10-23 NOTE — Telephone Encounter (Signed)
Please advise 

## 2018-10-23 NOTE — Telephone Encounter (Signed)
Patient called stated she is not taking either of her meds. Currently having bad dreams.Cocerned that may be the result of no medication. Please advise

## 2018-10-24 DIAGNOSIS — F320 Major depressive disorder, single episode, mild: Secondary | ICD-10-CM | POA: Diagnosis not present

## 2018-10-24 DIAGNOSIS — F411 Generalized anxiety disorder: Secondary | ICD-10-CM | POA: Diagnosis not present

## 2018-10-24 DIAGNOSIS — J069 Acute upper respiratory infection, unspecified: Secondary | ICD-10-CM | POA: Diagnosis not present

## 2018-10-24 DIAGNOSIS — Z7689 Persons encountering health services in other specified circumstances: Secondary | ICD-10-CM | POA: Diagnosis not present

## 2018-10-24 DIAGNOSIS — G43009 Migraine without aura, not intractable, without status migrainosus: Secondary | ICD-10-CM | POA: Diagnosis not present

## 2018-10-24 NOTE — Telephone Encounter (Signed)
Pt just stopped 20 mg of Viibryd this past Tues. 10/21/2018, instructed to take 10 mg and give the provider a call back in the middle of next week with an update. Verbalized understanding and appreciative of suggestion.

## 2018-10-31 ENCOUNTER — Ambulatory Visit: Payer: Medicare Other | Admitting: Psychology

## 2018-11-19 ENCOUNTER — Ambulatory Visit (INDEPENDENT_AMBULATORY_CARE_PROVIDER_SITE_OTHER): Payer: Medicare Other | Admitting: Psychiatry

## 2018-11-19 VITALS — BP 133/77 | HR 63

## 2018-11-19 DIAGNOSIS — F320 Major depressive disorder, single episode, mild: Secondary | ICD-10-CM | POA: Diagnosis not present

## 2018-11-19 DIAGNOSIS — F5105 Insomnia due to other mental disorder: Secondary | ICD-10-CM | POA: Diagnosis not present

## 2018-11-19 DIAGNOSIS — F419 Anxiety disorder, unspecified: Secondary | ICD-10-CM | POA: Diagnosis not present

## 2018-11-19 DIAGNOSIS — F99 Mental disorder, not otherwise specified: Secondary | ICD-10-CM | POA: Diagnosis not present

## 2018-11-19 MED ORDER — LITHIUM CARBONATE 150 MG PO CAPS: ORAL_CAPSULE | ORAL | 1 refills | Status: DC

## 2018-11-19 NOTE — Progress Notes (Signed)
[name] [id number] 1946-03-08 73 y.o.  Subjective:   Patient ID:  Andrea Burke is a 73 y.o. (DOB 1946/06/05) female.  Chief Complaint: No chief complaint on file.   HPI [name] presents to the office today for follow-up of depression, anxiety, and sleep disturbance. "I'm maintaining, but I am not getting up there." She reports that she had a recent URI and that it has been difficult to discern how she has feeling. She reports that she stopped l-methyl-folate about 3-4 weeks. She reports that she is back on Viibryd 20 mg po qd. She reports, "some nights I sleep, some nights I don't and dreams are off and on." Reports that some nights it can take up to 2-3 hours for her to be able to fall asleep. She reports that some nights she is able to fall asleep without difficulty. Reports that dreams have been less frequent. Reports that mood is slightly depressed- not severely depressed or overly enthusiastic. She reports feeling "a little nervous" about work situation. Reports some generalized anxiety and wishes she could see her children more. She reports that her energy and motivation are low. She reports difficulty with concentration. Denies any change in appetite. Denies SI.   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   Past medication trials: Remeron- vivid dreams, increased appetite Lexapro-nightmares Sertraline-agitation, insomnia, impaired concentration Paxil-intolerable side effects Fluoxetine-intolerable side effects Viibryd- Partial improvement on 20 mg. Does not want to take doses above 20 mg. Wellbutrin- Severe tremors Hydroxyzine Deplin  Review of Systems:  Review of Systems  HENT:       Has had recent URI.   Musculoskeletal: Negative for gait problem.  Skin:       Reports that she has had recent worsening in hair loss and that dermatologist prescribed finasteride in the past and she plans to  start this.   Neurological: Negative for tremors.  Psychiatric/Behavioral:       Please refer to HPI   Reports chronic dry eyes and dry mouth.  Medications: I have reviewed the patient's current medications.  Current Outpatient Medications  Medication Sig Dispense Refill  . BUTALBITAL-ACETAMINOPHEN PO Take by mouth.    . Calcium Carbonate-Vitamin D (CALTRATE 600+D PO) Take by mouth.    . fish oil-omega-3 fatty acids 1000 MG capsule Take 2 g by mouth daily.    . hydroxychloroquine (PLAQUENIL) 200 MG tablet Take by mouth daily.    [name] Kitchen HYDROXYZINE HCL PO Take by mouth daily as needed.     . Multiple Vitamins-Minerals (CENTRUM PO) Take by mouth.    . triamcinolone cream (KENALOG) 0.1 % Apply topically 2 (two) times daily. 30 g 0  . Vilazodone HCl (VIIBRYD) 20 MG TABS Take 20 mg by mouth daily.     . Vilazodone HCl (VIIBRYD) 40 MG TABS Take 1/2-1 tab po qd 90 tablet 0  . ALPRAZolam (XANAX) 0.25 MG tablet Take 0.25 mg by mouth daily as needed.     . lithium carbonate 150 MG capsule Take 1-2 capsules po QHS 60 capsule 1   No current facility-administered medications for this visit.     Medication Side Effects: None  Allergies:  Allergies  Allergen Reactions  . Doxycycline Calcium   . Penicillins     Past Medical History:  Diagnosis Date  . Cancer (HCC)    KIDNEY  . Discoid lupus   . Endometriosis   . Headache(784.0)   .  Insomnia   . Kidney disease   . Lupus ([place])   . Osteopenia   . Urinary incontinence     Family History  Problem Relation Age of Onset  . Hypertension Mother   . Hyperlipidemia Mother   . Tremor Mother   . Breast cancer Sister   . Hyperlipidemia Father   . Hypertension Father   . Dementia Father   . Tremor Father   . Cancer Sister   . Cancer Other     Social History   Socioeconomic History  . Marital status: Married    Spouse name: Not on file  . Number of children: Not on file  . Years of education: Not on file  . Highest education level:  Not on file  Occupational History  . Not on file  Social Needs  . Financial resource strain: Not on file  . Food insecurity:    Worry: Not on file    Inability: Not on file  . Transportation needs:    Medical: Not on file    Non-medical: Not on file  Tobacco Use  . Smoking status: Former Research scientist (life sciences)  . Smokeless tobacco: Never Used  Substance and Sexual Activity  . Alcohol use: Yes    Comment: rare  . Drug use: Not on file  . Sexual activity: Never    Birth control/protection: Post-menopausal  Lifestyle  . Physical activity:    Days per week: Not on file    Minutes per session: Not on file  . Stress: Not on file  Relationships  . Social connections:    Talks on phone: Not on file    Gets together: Not on file    Attends religious service: Not on file    Active member of club or organization: Not on file    Attends meetings of clubs or organizations: Not on file    Relationship status: Not on file  . Intimate partner violence:    Fear of current or ex partner: Not on file    Emotionally abused: Not on file    Physically abused: Not on file    Forced sexual activity: Not on file  Other Topics Concern  . Not on file  Social History Narrative  . Not on file    Past Medical History, Surgical history, Social history, and Family history were reviewed and updated as appropriate.   Please see review of systems for further details on the patient's review from today.   Objective:   Physical Exam:  BP 133/77   Pulse 63   Physical Exam Constitutional:      General: She is not in acute distress.    Appearance: She is well-developed.  Musculoskeletal:        General: No deformity.  Neurological:     Mental Status: She is alert and oriented to person, place, and time.     Coordination: Coordination normal.  Psychiatric:        Attention and Perception: Attention and perception normal. She does not perceive auditory or visual hallucinations.        Mood and Affect: Mood is  depressed. Mood is not anxious. Affect is blunt. Affect is not labile, angry or inappropriate.        Speech: Speech normal.        Behavior: Behavior normal.        Thought Content: Thought content normal. Thought content does not include homicidal or suicidal ideation. Thought content does not include homicidal or suicidal plan.  Cognition and Memory: Cognition and memory normal.        Judgment: Judgment normal.     Comments: Insight intact. No delusions.      Lab Review:     Component Value Date/Time   NA 138 07/31/2014 1222   K 4.2 07/31/2014 1222   CL 107 07/31/2014 1222   CO2 24 07/31/2014 1222   GLUCOSE 88 07/31/2014 1222   BUN 11 07/31/2014 1222   CREATININE 0.72 07/31/2014 1222   CALCIUM 9.5 07/31/2014 1222   PROT 7.5 07/31/2014 1222   ALBUMIN 4.3 07/31/2014 1222   AST 21 07/31/2014 1222   ALT 17 07/31/2014 1222   ALKPHOS 54 07/31/2014 1222   BILITOT 0.6 07/31/2014 1222       Component Value Date/Time   WBC 3.7 (L) 09/29/2018 0951   RBC 4.86 09/29/2018 0951   HGB 14.1 09/29/2018 0951   HCT 41.8 09/29/2018 0951   PLT 183 09/29/2018 0951   MCV 86.0 09/29/2018 0951   MCV 86.6 07/31/2014 1222   MCH 29.0 09/29/2018 0951   MCHC 33.7 09/29/2018 0951   RDW 12.2 09/29/2018 0951   LYMPHSABS 1,887 09/29/2018 0951   EOSABS 122 09/29/2018 0951   BASOSABS 30 09/29/2018 0951    No results found for: POCLITH, LITHIUM   No results found for: PHENYTOIN, PHENOBARB, VALPROATE, CBMZ   .res Assessment: Plan:   Case staffed with Dr. Clovis Pu. Discussed potential benefits, risks, and side effects of low dose lithium as augmentation for depression since pt has had significant tolerability issues with SSRI's and Wellbutrin. Discussed that low dose lithium is associated with minimal side effects and does not require lab monitoring. Discussed that Lithium may also be helpful for her insomnia.   Continue Viibryd 20 mg po qd for mood and anxiety.  Major depressive disorder,  single episode, mild (HCC) - Plan: lithium carbonate 150 MG capsule  Anxiety disorder, unspecified type  Insomnia due to other mental disorder  Please see After Visit Summary for patient specific instructions.  Future Appointments  Date Time Provider [place]  01/01/2019  8:00 AM Cottle, Lucious Groves, LCSW [facility] None  01/08/2019 10:00 AM Thayer Headings, PMHNP [facility] None    No orders of the defined types were placed in this encounter.     -------------------------------

## 2018-11-22 ENCOUNTER — Encounter: Payer: Self-pay | Admitting: Psychiatry

## 2018-11-27 ENCOUNTER — Ambulatory Visit: Payer: Medicare Other | Admitting: Psychology

## 2019-01-01 ENCOUNTER — Ambulatory Visit (INDEPENDENT_AMBULATORY_CARE_PROVIDER_SITE_OTHER): Payer: Medicare Other | Admitting: Psychology

## 2019-01-01 DIAGNOSIS — F411 Generalized anxiety disorder: Secondary | ICD-10-CM | POA: Diagnosis not present

## 2019-01-08 ENCOUNTER — Ambulatory Visit: Payer: Medicare Other | Admitting: Psychiatry

## 2019-02-02 ENCOUNTER — Encounter: Payer: Self-pay | Admitting: Psychiatry

## 2019-02-02 ENCOUNTER — Ambulatory Visit (INDEPENDENT_AMBULATORY_CARE_PROVIDER_SITE_OTHER): Payer: Medicare Other | Admitting: Psychiatry

## 2019-02-02 ENCOUNTER — Other Ambulatory Visit: Payer: Self-pay

## 2019-02-02 DIAGNOSIS — F419 Anxiety disorder, unspecified: Secondary | ICD-10-CM

## 2019-02-02 DIAGNOSIS — F320 Major depressive disorder, single episode, mild: Secondary | ICD-10-CM | POA: Diagnosis not present

## 2019-02-02 DIAGNOSIS — F5105 Insomnia due to other mental disorder: Secondary | ICD-10-CM | POA: Diagnosis not present

## 2019-02-02 DIAGNOSIS — F99 Mental disorder, not otherwise specified: Secondary | ICD-10-CM | POA: Diagnosis not present

## 2019-02-02 MED ORDER — LITHIUM CARBONATE 150 MG PO CAPS: ORAL_CAPSULE | ORAL | 1 refills | Status: DC

## 2019-02-02 NOTE — Progress Notes (Signed)
Andrea Burke [id number] 07/22/1946 73 y.o.  Virtual Visit via Video Note  I connected with@ on 02/02/19 at 11:00 AM EDT by a video enabled telemedicine application and verified that I am speaking with the correct person using two identifiers.   I discussed the limitations of evaluation and management by telemedicine and the availability of in person appointments. The patient expressed understanding and agreed to proceed.  I discussed the assessment and treatment plan with the patient. The patient was provided an opportunity to ask questions and all were answered. The patient agreed with the plan and demonstrated an understanding of the instructions.   The patient was advised to call back or seek an in-person evaluation if the symptoms worsen or if the condition fails to improve as anticipated.  I provided 30 minutes of non-face-to-face time during this encounter.  The patient was located at home.  The provider was located at home.   Thayer Headings, PMHNP   Subjective:   Patient ID:  Andrea Burke is a 73 y.o. (DOB Nov 02, 1945) female.  Chief Complaint:  Chief Complaint  Patient presents with  . Follow-up    h/o anxiety, depression, sleep disturbance    HPI Andrea Burke presents to the office today for follow-up of anxiety, depression, sleep disturbance.  Patient reports, "I'm not as anxious as when I started" treatment. She reports that she has had periods of anxiety and depression in response to pandemic. She reports that overall anxiety is lower compared to a year ago. She describes her mood as "ok." She denies persistent depression. "I am sad sometimes." She reports that she has been sleeping ok. Reports taking Viibryd 20 mg po around 7-8 pm and then takes the Lithium about 10-11 pm. She reports that after she started it "I wasn't in a fog anymore. I felt sharper." She reports that she stopped taking it the last 3 nights. Reports that her tremors have been worse, her  voice is trembling, and she has had hair loss. She reports that her sleep is adequate most nights. She reports that she has continued to have dreams but most of her dreams have not been distressing. She reports that some nights she is going to bed late and sleeping late. Estimates sleeping 7-8 hours at night.  Reports taking a nap some days and will sleep at least an hour. She reports that she has been eating and snacking more since stay at home order. She reports that her energy and motivation has been ok and that she and her husband have been cleaning out their shed. She reports that her concentration is somewhat impaired when doing more detailed, tedious tasks. Concentration is adequate for more active projects. Reports finding limited enjoyment and has not been able to see her grandchildren for the last 2 months. Denies SI.   Reports that her last day of work was 12/23/18 and was furloughed.   Past medication trials: Remeron-vivid dreams, increased appetite Lexapro-nightmares Sertraline-agitation, insomnia, impaired concentration Paxil-intolerable side effects Fluoxetine-intolerable side effects Viibryd- Partial improvement on 20 mg. Does not want to take doses above 20 mg. Wellbutrin- Severe tremors Hydroxyzine Deplin  Review of Systems:  Review of Systems  HENT: Positive for voice change.   Endocrine:       Hair loss  Musculoskeletal: Positive for arthralgias and back pain. Negative for gait problem.  Skin:       Reports that she had period of face breaking out and some cysts.   Neurological: Positive for tremors.  Psychiatric/Behavioral:       Please refer to HPI    Medications: I have reviewed the patient's current medications.  Current Outpatient Medications  Medication Sig Dispense Refill  . ALPRAZolam (XANAX) 0.25 MG tablet Take 0.25 mg by mouth daily as needed.     [name] Kitchen BUTALBITAL-ACETAMINOPHEN PO Take by mouth.    . Calcium Carbonate-Vitamin D (CALTRATE 600+D PO) Take by  mouth.    . fish oil-omega-3 fatty acids 1000 MG capsule Take 2 g by mouth daily.    . hydroxychloroquine (PLAQUENIL) 200 MG tablet Take by mouth daily.    [name] Kitchen HYDROXYZINE HCL PO Take by mouth daily as needed.     . lithium carbonate 150 MG capsule Take 1-2 capsules po QHS 60 capsule 1  . Multiple Vitamins-Minerals (CENTRUM PO) Take by mouth.    . triamcinolone cream (KENALOG) 0.1 % Apply topically 2 (two) times daily. 30 g 0  . Vilazodone HCl (VIIBRYD) 20 MG TABS Take 20 mg by mouth daily.     . Vilazodone HCl (VIIBRYD) 40 MG TABS Take 1/2-1 tab po qd 90 tablet 0   No current facility-administered medications for this visit.     Medication Side Effects: Other: Tremors, voice is trembling  Allergies:  Allergies  Allergen Reactions  . Doxycycline Calcium   . Penicillins     Past Medical History:  Diagnosis Date  . Cancer (HCC)    KIDNEY  . Discoid lupus   . Endometriosis   . Headache(784.0)   . Insomnia   . Kidney disease   . Lupus ([place])   . Osteopenia   . Urinary incontinence     Family History  Problem Relation Age of Onset  . Hypertension Mother   . Hyperlipidemia Mother   . Tremor Mother   . Breast cancer Sister   . Hyperlipidemia Father   . Hypertension Father   . Dementia Father   . Tremor Father   . Cancer Sister   . Cancer Other     Social History   Socioeconomic History  . Marital status: Married    Spouse name: Not on file  . Number of children: Not on file  . Years of education: Not on file  . Highest education level: Not on file  Occupational History  . Not on file  Social Needs  . Financial resource strain: Not on file  . Food insecurity:    Worry: Not on file    Inability: Not on file  . Transportation needs:    Medical: Not on file    Non-medical: Not on file  Tobacco Use  . Smoking status: Former Research scientist (life sciences)  . Smokeless tobacco: Never Used  Substance and Sexual Activity  . Alcohol use: Yes    Comment: rare  . Drug use: Not on file  .  Sexual activity: Never    Birth control/protection: Post-menopausal  Lifestyle  . Physical activity:    Days per week: Not on file    Minutes per session: Not on file  . Stress: Not on file  Relationships  . Social connections:    Talks on phone: Not on file    Gets together: Not on file    Attends religious service: Not on file    Active member of club or organization: Not on file    Attends meetings of clubs or organizations: Not on file    Relationship status: Not on file  . Intimate partner violence:    Fear of current or ex  partner: Not on file    Emotionally abused: Not on file    Physically abused: Not on file    Forced sexual activity: Not on file  Other Topics Concern  . Not on file  Social History Narrative  . Not on file    Past Medical History, Surgical history, Social history, and Family history were reviewed and updated as appropriate.   Please see review of systems for further details on the patient's review from today.   Objective:   Physical Exam:  There were no vitals taken for this visit.  Physical Exam Neurological:     Mental Status: She is alert and oriented to person, place, and time.     Cranial Nerves: No dysarthria.  Psychiatric:        Attention and Perception: Attention normal.        Mood and Affect: Affect is blunt.        Speech: Speech normal.        Behavior: Behavior is cooperative.        Thought Content: Thought content normal. Thought content is not paranoid or delusional. Thought content does not include homicidal or suicidal ideation. Thought content does not include homicidal or suicidal plan.        Cognition and Memory: Cognition and memory normal.        Judgment: Judgment normal.     Comments: Mood presents as mildly depressed.     Lab Review:     Component Value Date/Time   NA 138 07/31/2014 1222   K 4.2 07/31/2014 1222   CL 107 07/31/2014 1222   CO2 24 07/31/2014 1222   GLUCOSE 88 07/31/2014 1222   BUN 11  07/31/2014 1222   CREATININE 0.72 07/31/2014 1222   CALCIUM 9.5 07/31/2014 1222   PROT 7.5 07/31/2014 1222   ALBUMIN 4.3 07/31/2014 1222   AST 21 07/31/2014 1222   ALT 17 07/31/2014 1222   ALKPHOS 54 07/31/2014 1222   BILITOT 0.6 07/31/2014 1222       Component Value Date/Time   WBC 3.7 (L) 09/29/2018 0951   RBC 4.86 09/29/2018 0951   HGB 14.1 09/29/2018 0951   HCT 41.8 09/29/2018 0951   PLT 183 09/29/2018 0951   MCV 86.0 09/29/2018 0951   MCV 86.6 07/31/2014 1222   MCH 29.0 09/29/2018 0951   MCHC 33.7 09/29/2018 0951   RDW 12.2 09/29/2018 0951   LYMPHSABS 1,887 09/29/2018 0951   EOSABS 122 09/29/2018 0951   BASOSABS 30 09/29/2018 0951    No results found for: POCLITH, LITHIUM   No results found for: PHENYTOIN, PHENOBARB, VALPROATE, CBMZ   .res Assessment: Plan:   Discussed treatment plan with patient and various treatment options.  Patient reports that she would like to medications at some point, however agreed that it would likely be best not to reduce medications at this time during pandemic and increased stressors.  Discussed that she could continue to not take lithium for several more days and that she would be able to discern if side effects improve off of lithium since lithium is cleared after 4 days.  Discussed that she could resume lithium if she notices worsening mood signs and symptoms or insomnia and feels that potential benefits outweigh possible side effects. Will continue Viibryd 40 mg 1/2 tab p.o. daily for depression and anxiety. Patient to follow-up in 2 months or sooner if clinically indicated. Patient advised to contact office with any questions, adverse effects, or acute worsening in signs and  symptoms.   Anxiety disorder, unspecified type  Major depressive disorder, single episode, mild ([place]) - Plan: lithium carbonate 150 MG capsule  Insomnia due to other mental disorder  Please see After Visit Summary for patient specific instructions.  No future  appointments.  No orders of the defined types were placed in this encounter.     -------------------------------

## 2019-04-02 ENCOUNTER — Ambulatory Visit: Payer: Medicare Other | Admitting: Psychiatry

## 2019-04-09 ENCOUNTER — Encounter: Payer: Self-pay | Admitting: Psychiatry

## 2019-04-09 ENCOUNTER — Other Ambulatory Visit: Payer: Self-pay

## 2019-04-09 ENCOUNTER — Ambulatory Visit (INDEPENDENT_AMBULATORY_CARE_PROVIDER_SITE_OTHER): Payer: Medicare Other | Admitting: Psychiatry

## 2019-04-09 DIAGNOSIS — F321 Major depressive disorder, single episode, moderate: Secondary | ICD-10-CM

## 2019-04-09 DIAGNOSIS — F419 Anxiety disorder, unspecified: Secondary | ICD-10-CM

## 2019-04-09 DIAGNOSIS — F5105 Insomnia due to other mental disorder: Secondary | ICD-10-CM | POA: Diagnosis not present

## 2019-04-09 DIAGNOSIS — F99 Mental disorder, not otherwise specified: Secondary | ICD-10-CM

## 2019-04-09 MED ORDER — VIIBRYD 20 MG PO TABS: 30.0000 mg | ORAL_TABLET | Freq: Every day | ORAL | 0 refills | Status: DC

## 2019-04-09 NOTE — Patient Instructions (Signed)
Increase Viibryd 25 mg daily for at least 7 days, and then increase to 30 mg daily. Can increase slower if needed.

## 2019-04-09 NOTE — Progress Notes (Signed)
Andrea Burke [id number] 12-Nov-1945 73 y.o.  Subjective:   Patient ID:  Andrea Burke is a 73 y.o. (DOB [birth year]) female.  Chief Complaint:  Chief Complaint  Patient presents with  . Anxiety  . Depression    HPI Andrea Burke presents to the office today for follow-up of anxiety and depression. Reports sad mood. She reports "kind of in limbo." She reports that it has been unclear if she is returning to work and reports that she has felt as if she cannot relax or get motivated to do things. Reports that her motivation has been low. She reports that she has been sleeping excessively at times. Reports other times she is in bed for 9-10 hours but may be awake for long periods or watching TV. Continues to have stress dreams. Reports that she has not had nightmares in the last few weeks.  Reports appetite is higher than she would like and has been snacking more. Reports concentration has not been good. Loses train of thought on exam. Reports periods of hopelessness in response to current events. Denies SI.   She reports feeling somewhat anxious. She reports worrying about things, such as if she gets exposed and husband gets sick as a result. She reports worrying about grandchildren's school and if grandchildren will forget them. She reports some increase in worry. She reports that she has had increased heart rate at times with anxiety.  Was able to see her grandchildren recently for the first time in 4 months. Talks with her mother daily by phone.   Reports that she has not taken Xanax prn recently. Reports that she stopped Lithium.   Reports that she is in the process of changing PCP's to Dr. Bernerd Limbo with [facility].   Past medication trials: Remeron-vivid dreams, increased appetite Lexapro-nightmares Sertraline-agitation, insomnia, impaired concentration Paxil-intolerable side effects Fluoxetine-intolerable side effects Viibryd- Partial improvement on 20 mg. Does not want  to take doses above 20 mg. Wellbutrin- Severe tremors Lithium Hydroxyzine Xanax Deplin  Review of Systems:  Review of Systems  Eyes: Positive for visual disturbance.  Musculoskeletal: Positive for arthralgias and back pain. Negative for gait problem.  Neurological: Negative for tremors.  Psychiatric/Behavioral:       Please refer to HPI    Medications: I have reviewed the patient's current medications.  Current Outpatient Medications  Medication Sig Dispense Refill  . BUTALBITAL-ACETAMINOPHEN PO Take by mouth.    . Calcium Carbonate-Vitamin D (CALTRATE 600+D PO) Take by mouth.    . finasteride (PROSCAR) 5 MG tablet Take 2.5 mg by mouth daily.    . fish oil-omega-3 fatty acids 1000 MG capsule Take 2 g by mouth daily.    . hydroxychloroquine (PLAQUENIL) 200 MG tablet Take by mouth daily.    . Multiple Vitamins-Minerals (CENTRUM PO) Take by mouth.    . Vilazodone HCl (VIIBRYD) 20 MG TABS Take 1.5 tablets (30 mg total) by mouth daily. 30 tablet 0  . Vilazodone HCl (VIIBRYD) 40 MG TABS Take 1/2-1 tab po qd 90 tablet 0  . ALPRAZolam (XANAX) 0.25 MG tablet Take 0.25 mg by mouth daily as needed.      No current facility-administered medications for this visit.     Medication Side Effects: Other: Possible dreams  Allergies:  Allergies  Allergen Reactions  . Doxycycline Calcium   . Penicillins     Past Medical History:  Diagnosis Date  . Cancer (HCC)    KIDNEY  . Discoid lupus   . Endometriosis   .  Headache(784.0)   . Insomnia   . Kidney disease   . Lupus ([place])   . Osteopenia   . Urinary incontinence     Family History  Problem Relation Age of Onset  . Hypertension Mother   . Hyperlipidemia Mother   . Tremor Mother   . Breast cancer Sister   . Hyperlipidemia Father   . Hypertension Father   . Dementia Father   . Tremor Father   . Cancer Sister   . Cancer Other     Social History   Socioeconomic History  . Marital status: Married    Spouse name: Not on  file  . Number of children: Not on file  . Years of education: Not on file  . Highest education level: Not on file  Occupational History  . Not on file  Social Needs  . Financial resource strain: Not on file  . Food insecurity    Worry: Not on file    Inability: Not on file  . Transportation needs    Medical: Not on file    Non-medical: Not on file  Tobacco Use  . Smoking status: Former Research scientist (life sciences)  . Smokeless tobacco: Never Used  Substance and Sexual Activity  . Alcohol use: Yes    Comment: rare  . Drug use: Not on file  . Sexual activity: Never    Birth control/protection: Post-menopausal  Lifestyle  . Physical activity    Days per week: Not on file    Minutes per session: Not on file  . Stress: Not on file  Relationships  . Social Herbalist on phone: Not on file    Gets together: Not on file    Attends religious service: Not on file    Active member of club or organization: Not on file    Attends meetings of clubs or organizations: Not on file    Relationship status: Not on file  . Intimate partner violence    Fear of current or ex partner: Not on file    Emotionally abused: Not on file    Physically abused: Not on file    Forced sexual activity: Not on file  Other Topics Concern  . Not on file  Social History Narrative  . Not on file    Past Medical History, Surgical history, Social history, and Family history were reviewed and updated as appropriate.   Please see review of systems for further details on the patient's review from today.   Objective:   Physical Exam:  There were no vitals taken for this visit.  Physical Exam Constitutional:      General: She is not in acute distress.    Appearance: She is well-developed.  Musculoskeletal:        General: No deformity.  Neurological:     Mental Status: She is alert and oriented to person, place, and time.     Coordination: Coordination normal.  Psychiatric:        Attention and Perception:  Attention and perception normal. She does not perceive auditory or visual hallucinations.        Mood and Affect: Mood is anxious and depressed. Affect is not labile, blunt, angry or inappropriate.        Speech: Speech normal.        Behavior: Behavior normal.        Thought Content: Thought content normal. Thought content does not include homicidal or suicidal ideation. Thought content does not include homicidal or suicidal plan.  Cognition and Memory: Cognition and memory normal.        Judgment: Judgment normal.     Comments: Insight intact. No delusions.      Lab Review:     Component Value Date/Time   NA 138 07/31/2014 1222   K 4.2 07/31/2014 1222   CL 107 07/31/2014 1222   CO2 24 07/31/2014 1222   GLUCOSE 88 07/31/2014 1222   BUN 11 07/31/2014 1222   CREATININE 0.72 07/31/2014 1222   CALCIUM 9.5 07/31/2014 1222   PROT 7.5 07/31/2014 1222   ALBUMIN 4.3 07/31/2014 1222   AST 21 07/31/2014 1222   ALT 17 07/31/2014 1222   ALKPHOS 54 07/31/2014 1222   BILITOT 0.6 07/31/2014 1222       Component Value Date/Time   WBC 3.7 (L) 09/29/2018 0951   RBC 4.86 09/29/2018 0951   HGB 14.1 09/29/2018 0951   HCT 41.8 09/29/2018 0951   PLT 183 09/29/2018 0951   MCV 86.0 09/29/2018 0951   MCV 86.6 07/31/2014 1222   MCH 29.0 09/29/2018 0951   MCHC 33.7 09/29/2018 0951   RDW 12.2 09/29/2018 0951   LYMPHSABS 1,887 09/29/2018 0951   EOSABS 122 09/29/2018 0951   BASOSABS 30 09/29/2018 0951    No results found for: POCLITH, LITHIUM   No results found for: PHENYTOIN, PHENOBARB, VALPROATE, CBMZ   .res Assessment: Plan:   Discussed possible tx options. Pt reports that she prefers not to take more than one medication for her mood and anxiety. Discussed risks and benefits of changing Viibryd to a different medication, continuing current dose of Viibryd, or increasing dose of Viibryd. Pt agrees to increase in dose of Viibryd. Discussed that pt could increase dose to 25 mg po qd x 1  week prior to increasing to 30 mg po qd since pt reports some ambivalence about increasing dose.  Discussed that she may wish to consider re-starting therapy for depression and anxiety, particularly since she prefers to use the least possible amount of medication.  Pt to f/u in 4 weeks or sooner if clinically indicated.  Patient advised to contact office with any questions, adverse effects, or acute worsening in signs and symptoms.   Andrea Burke was seen today for anxiety and depression.  Diagnoses and all orders for this visit:  Anxiety disorder, unspecified type  Insomnia due to other mental disorder  Moderate single current episode of major depressive disorder ([place])  Other orders -     Vilazodone HCl (VIIBRYD) 20 MG TABS; Take 1.5 tablets (30 mg total) by mouth daily.     Please see After Visit Summary for patient specific instructions.  Future Appointments  Date Time Provider Welcome  05/14/2019  9:30 AM Thayer Headings, PMHNP [facility]-CP None    No orders of the defined types were placed in this encounter.   -------------------------------

## 2019-04-13 DIAGNOSIS — Z5181 Encounter for therapeutic drug level monitoring: Secondary | ICD-10-CM | POA: Diagnosis not present

## 2019-04-13 DIAGNOSIS — D229 Melanocytic nevi, unspecified: Secondary | ICD-10-CM | POA: Diagnosis not present

## 2019-04-13 DIAGNOSIS — Z79899 Other long term (current) drug therapy: Secondary | ICD-10-CM | POA: Diagnosis not present

## 2019-04-13 DIAGNOSIS — L814 Other melanin hyperpigmentation: Secondary | ICD-10-CM | POA: Diagnosis not present

## 2019-04-13 DIAGNOSIS — L821 Other seborrheic keratosis: Secondary | ICD-10-CM | POA: Diagnosis not present

## 2019-04-13 DIAGNOSIS — L932 Other local lupus erythematosus: Secondary | ICD-10-CM | POA: Diagnosis not present

## 2019-04-13 DIAGNOSIS — L649 Androgenic alopecia, unspecified: Secondary | ICD-10-CM | POA: Diagnosis not present

## 2019-04-14 DIAGNOSIS — Z961 Presence of intraocular lens: Secondary | ICD-10-CM | POA: Diagnosis not present

## 2019-04-14 DIAGNOSIS — Z79899 Other long term (current) drug therapy: Secondary | ICD-10-CM | POA: Diagnosis not present

## 2019-04-23 DIAGNOSIS — H26491 Other secondary cataract, right eye: Secondary | ICD-10-CM | POA: Diagnosis not present

## 2019-05-04 DIAGNOSIS — Z13228 Encounter for screening for other metabolic disorders: Secondary | ICD-10-CM | POA: Diagnosis not present

## 2019-05-04 DIAGNOSIS — Z13220 Encounter for screening for lipoid disorders: Secondary | ICD-10-CM | POA: Diagnosis not present

## 2019-05-04 DIAGNOSIS — Z719 Counseling, unspecified: Secondary | ICD-10-CM | POA: Diagnosis not present

## 2019-05-04 DIAGNOSIS — F321 Major depressive disorder, single episode, moderate: Secondary | ICD-10-CM | POA: Diagnosis not present

## 2019-05-04 DIAGNOSIS — Z0000 Encounter for general adult medical examination without abnormal findings: Secondary | ICD-10-CM | POA: Diagnosis not present

## 2019-05-04 DIAGNOSIS — D72819 Decreased white blood cell count, unspecified: Secondary | ICD-10-CM | POA: Diagnosis not present

## 2019-05-04 DIAGNOSIS — Z85528 Personal history of other malignant neoplasm of kidney: Secondary | ICD-10-CM | POA: Diagnosis not present

## 2019-05-04 DIAGNOSIS — L932 Other local lupus erythematosus: Secondary | ICD-10-CM | POA: Diagnosis not present

## 2019-05-04 DIAGNOSIS — R251 Tremor, unspecified: Secondary | ICD-10-CM | POA: Diagnosis not present

## 2019-05-04 DIAGNOSIS — E785 Hyperlipidemia, unspecified: Secondary | ICD-10-CM | POA: Diagnosis not present

## 2019-05-14 ENCOUNTER — Ambulatory Visit (INDEPENDENT_AMBULATORY_CARE_PROVIDER_SITE_OTHER): Payer: Medicare Other | Admitting: Psychiatry

## 2019-05-14 ENCOUNTER — Other Ambulatory Visit: Payer: Self-pay

## 2019-05-14 ENCOUNTER — Encounter: Payer: Self-pay | Admitting: Psychiatry

## 2019-05-14 VITALS — BP 127/69 | HR 60

## 2019-05-14 DIAGNOSIS — F419 Anxiety disorder, unspecified: Secondary | ICD-10-CM | POA: Diagnosis not present

## 2019-05-14 DIAGNOSIS — F5105 Insomnia due to other mental disorder: Secondary | ICD-10-CM

## 2019-05-14 DIAGNOSIS — F321 Major depressive disorder, single episode, moderate: Secondary | ICD-10-CM | POA: Diagnosis not present

## 2019-05-14 DIAGNOSIS — F99 Mental disorder, not otherwise specified: Secondary | ICD-10-CM
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
## 2019-06-09 ENCOUNTER — Ambulatory Visit (INDEPENDENT_AMBULATORY_CARE_PROVIDER_SITE_OTHER): Payer: Medicare Other | Admitting: Psychiatry

## 2019-06-09 ENCOUNTER — Other Ambulatory Visit: Payer: Self-pay

## 2019-06-09 DIAGNOSIS — F419 Anxiety disorder, unspecified: Secondary | ICD-10-CM
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
## 2019-06-10 DIAGNOSIS — Z803 Family history of malignant neoplasm of breast: Secondary | ICD-10-CM | POA: Diagnosis not present

## 2019-06-10 DIAGNOSIS — Z1231 Encounter for screening mammogram for malignant neoplasm of breast: Secondary | ICD-10-CM | POA: Diagnosis not present

## 2019-06-11 ENCOUNTER — Other Ambulatory Visit: Payer: Self-pay

## 2019-06-11 ENCOUNTER — Ambulatory Visit (INDEPENDENT_AMBULATORY_CARE_PROVIDER_SITE_OTHER): Payer: Medicare Other | Admitting: Psychiatry

## 2019-06-11 ENCOUNTER — Encounter: Payer: Self-pay | Admitting: Psychiatry

## 2019-06-11 DIAGNOSIS — F320 Major depressive disorder, single episode, mild: Secondary | ICD-10-CM

## 2019-06-11 DIAGNOSIS — F419 Anxiety disorder, unspecified: Secondary | ICD-10-CM

## 2019-06-11 MED ORDER — VIIBRYD 40 MG PO TABS: ORAL_TABLET | ORAL | 0 refills | Status: DC

## 2019-06-11 NOTE — Progress Notes (Signed)
Andrea Burke [id number] Jun 07, 1946 73 y.o.  Subjective:   Patient ID:  Andrea Burke is a 73 y.o. (DOB [birth year]) female.  Chief Complaint:  Chief Complaint  Patient presents with  . Depression  . Anxiety    HPI Andrea Burke presents to the office today for follow-up of depression and anxiety. Has had less sleep disturbance with decreased difficulty falling asleep and few nights of early morning awakening. She reports that she continues to have frequent dreams "but not as dramatic or scary." Describes mood as "blah" and down. Continues to have frequent worry and feeling nervous. Denies any change in anxiety with increase in Viibryd. Reports "not seeing the more hopeful side of things." Denies any change in mood. Continued frustration and irritability. No change in anxiety. Energy and motivation remain low. Concentration difficulties. Denies SI.   "I'm waiting for the curtain to lift."  Leave from work was extended through January.   Past medication trials: Remeron-vivid dreams, increased appetite Lexapro-nightmares Sertraline-agitation, insomnia, impaired concentration Paxil-intolerable side effects Fluoxetine-intolerable side effects Viibryd- Partial improvement on 20 mg. Does not want to take doses above 20 mg. Wellbutrin- Severe tremors Lithium Hydroxyzine Xanax Deplin  Review of Systems:  Review of Systems  Gastrointestinal:       Increased flatulence. Intermittent diarrhea  Musculoskeletal: Positive for arthralgias. Negative for gait problem.  Neurological:       Notices some tremor with anxiety and agitation  Psychiatric/Behavioral:       Please refer to HPI    Medications: I have reviewed the patient's current medications.  Current Outpatient Medications  Medication Sig Dispense Refill  . ALPRAZolam (XANAX) 0.25 MG tablet Take 0.25 mg by mouth daily as needed.     [name] Kitchen BUTALBITAL-ACETAMINOPHEN PO Take by mouth.    . Calcium Carbonate-Vitamin D  (CALTRATE 600+D PO) Take by mouth.    . finasteride (PROSCAR) 5 MG tablet Take 2.5 mg by mouth daily.    . fish oil-omega-3 fatty acids 1000 MG capsule Take 2 g by mouth daily.    . hydroxychloroquine (PLAQUENIL) 200 MG tablet Take by mouth daily.    . Multiple Vitamins-Minerals (CENTRUM PO) Take by mouth.    . Vilazodone HCl (VIIBRYD) 40 MG TABS Take 1/2-1 tab po qd 90 tablet 0   No current facility-administered medications for this visit.     Medication Side Effects: Other: Possible GI SE's  Allergies:  Allergies  Allergen Reactions  . Doxycycline Calcium   . Penicillins     Past Medical History:  Diagnosis Date  . Cancer (HCC)    KIDNEY  . Discoid lupus   . Endometriosis   . Headache(784.0)   . Insomnia   . Kidney disease   . Lupus ([place])   . Osteopenia   . Urinary incontinence     Family History  Problem Relation Age of Onset  . Hypertension Mother   . Hyperlipidemia Mother   . Tremor Mother   . Breast cancer Sister   . Hyperlipidemia Father   . Hypertension Father   . Dementia Father   . Tremor Father   . Cancer Sister   . Cancer Other     Social History   Socioeconomic History  . Marital status: Married    Spouse name: Not on file  . Number of children: Not on file  . Years of education: Not on file  . Highest education level: Not on file  Occupational History  . Not on file  Social Needs  .  Financial resource strain: Not on file  . Food insecurity    Worry: Not on file    Inability: Not on file  . Transportation needs    Medical: Not on file    Non-medical: Not on file  Tobacco Use  . Smoking status: Former Research scientist (life sciences)  . Smokeless tobacco: Never Used  Substance and Sexual Activity  . Alcohol use: Yes    Comment: rare  . Drug use: Not on file  . Sexual activity: Never    Birth control/protection: Post-menopausal  Lifestyle  . Physical activity    Days per week: Not on file    Minutes per session: Not on file  . Stress: Not on file   Relationships  . Social Herbalist on phone: Not on file    Gets together: Not on file    Attends religious service: Not on file    Active member of club or organization: Not on file    Attends meetings of clubs or organizations: Not on file    Relationship status: Not on file  . Intimate partner violence    Fear of current or ex partner: Not on file    Emotionally abused: Not on file    Physically abused: Not on file    Forced sexual activity: Not on file  Other Topics Concern  . Not on file  Social History Narrative  . Not on file    Past Medical History, Surgical history, Social history, and Family history were reviewed and updated as appropriate.   Please see review of systems for further details on the patient's review from today.   Objective:   Physical Exam:  There were no vitals taken for this visit.  Physical Exam Constitutional:      General: She is not in acute distress.    Appearance: She is well-developed.  Musculoskeletal:        General: No deformity.  Neurological:     Mental Status: She is alert and oriented to person, place, and time.     Coordination: Coordination normal.  Psychiatric:        Attention and Perception: Attention and perception normal. She does not perceive auditory or visual hallucinations.        Mood and Affect: Mood is anxious and depressed. Affect is not labile, blunt, angry or inappropriate.        Speech: Speech normal.        Behavior: Behavior normal.        Thought Content: Thought content normal. Thought content does not include homicidal or suicidal ideation. Thought content does not include homicidal or suicidal plan.        Cognition and Memory: Cognition and memory normal.        Judgment: Judgment normal.     Comments: Insight intact. No delusions.      Lab Review:     Component Value Date/Time   NA 138 07/31/2014 1222   K 4.2 07/31/2014 1222   CL 107 07/31/2014 1222   CO2 24 07/31/2014 1222    GLUCOSE 88 07/31/2014 1222   BUN 11 07/31/2014 1222   CREATININE 0.72 07/31/2014 1222   CALCIUM 9.5 07/31/2014 1222   PROT 7.5 07/31/2014 1222   ALBUMIN 4.3 07/31/2014 1222   AST 21 07/31/2014 1222   ALT 17 07/31/2014 1222   ALKPHOS 54 07/31/2014 1222   BILITOT 0.6 07/31/2014 1222       Component Value Date/Time   WBC 3.7 (L) 09/29/2018  0951   RBC 4.86 09/29/2018 0951   HGB 14.1 09/29/2018 0951   HCT 41.8 09/29/2018 0951   PLT 183 09/29/2018 0951   MCV 86.0 09/29/2018 0951   MCV 86.6 07/31/2014 1222   MCH 29.0 09/29/2018 0951   MCHC 33.7 09/29/2018 0951   RDW 12.2 09/29/2018 0951   LYMPHSABS 1,887 09/29/2018 0951   EOSABS 122 09/29/2018 0951   BASOSABS 30 09/29/2018 0951    No results found for: POCLITH, LITHIUM   No results found for: PHENYTOIN, PHENOBARB, VALPROATE, CBMZ   .res Assessment: Plan:   Patient seen for 30 minutes and greater than 50% of visit spent counseling patient regarding possible treatment options.  Patient reports that she is reluctant to increase Viibryd to 40 mg daily.  Discussed decreasing Viibryd to 20 mg daily since there has been no significant improvement in mood or anxiety with increase in Viibryd to 30 mg daily and there has possibly been an increase in side effects.  Discussed potential benefits, risks, and side effects of several other treatment options to include augmentation with very low-dose Abilify or lamotrigine.  Patient reports that she would prefer not to add medication at this time and instead would like to continue Viibryd 20 mg daily and work with therapist to treat mood and anxiety signs and symptoms.  Also discussed possible lifestyle changes that may help with mood and anxiety as well, to include increasing physical activity and finding enjoyable hobbies and activities. Recommend continuing psychotherapy with Rinaldo Cloud, LCSW. Patient to follow-up with this provider in 3 months or sooner if clinically indicated. Patient advised to  contact office with any questions, adverse effects, or acute worsening in signs and symptoms.  Cincere was seen today for depression and anxiety.  Diagnoses and all orders for this visit:  Major depressive disorder, single episode, mild (HCC) -     Vilazodone HCl (VIIBRYD) 40 MG TABS; Take 1/2-1 tab po qd  Anxiety disorder, unspecified type -     Vilazodone HCl (VIIBRYD) 40 MG TABS; Take 1/2-1 tab po qd     Please see After Visit Summary for patient specific instructions.  Future Appointments  Date Time Provider [place]  06/30/2019 12:00 PM Shanon Ace, LCSW CP-CP None  07/14/2019 10:00 AM Shanon Ace, LCSW CP-CP None  07/28/2019 10:00 AM Shanon Ace, LCSW CP-CP None  08/11/2019 10:00 AM Shanon Ace, LCSW CP-CP None  09/10/2019 10:00 AM Thayer Headings, PMHNP CP-CP None    No orders of the defined types were placed in this encounter.   -------------------------------

## 2019-06-30 ENCOUNTER — Ambulatory Visit (INDEPENDENT_AMBULATORY_CARE_PROVIDER_SITE_OTHER): Payer: Medicare Other | Admitting: Psychiatry

## 2019-06-30 ENCOUNTER — Other Ambulatory Visit: Payer: Self-pay

## 2019-06-30 DIAGNOSIS — F320 Major depressive disorder, single episode, mild: Secondary | ICD-10-CM | POA: Diagnosis not present

## 2019-06-30 NOTE — Progress Notes (Signed)
[facility] Counselor/Therapist Progress Note  Patient ID: Andrea Burke, MRN: [id number],    Date: 06/30/2019  Time Spent: 60 minutes    12:00noon to 1:00pm  Treatment Type: Individual Therapy  Reported Symptoms: depression, anxiety, some sadness, frustration,   Mental Status Exam:  Appearance:   Neat     Behavior:  Appropriate and Sharing  Motor:  Normal  Speech/Language:   Normal Rate  Affect:  Depressed and anxious  Mood:  anxious, depressed and sad  Thought process:  normal  Thought content:    WNL  Sensory/Perceptual disturbances:    WNL  Orientation:  oriented to person, place, time/date, situation, day of week, month of year and year  Attention:  Good  Concentration:  Good  Memory:  patient states "ok but I can tell I'm starting to have some forgetting like names and other things."  Fund of knowledge:   Good  Insight:    Good  Judgment:   Good  Impulse Control:  Good   Risk Assessment: Danger to Self:  No Self-injurious Behavior: No Danger to Others: No Duty to Warn:no Physical Aggression / Violence:No  Access to Firearms a concern: No  Gang Involvement:No   Subjective:  Patient in today and feeling concerned, depressed, anxious.  Does spend time watching news on TV and online and it's stressful yet finds it hard to set limits. 80 yr old mother has been sick and in hospital recently--back at home now and doing some better. States she for a short period of time "may have gotten a little bit better, but not much, it's just easy for me to feel depressed, anxious, and on-edge."  Feels [employer] is helping her some right now. Denies any SI.  Was hard to follow up on mindfulness suggestions or walking out of the room away from TV when news is on. States she is an introvert but not very sure of herself. Right now is not comfortable going out much due to the virus  Interventions: Cognitive Behavioral Therapy and Solution-Oriented/Positive Psychology  Diagnosis:  ICD-10-CM   1. Major depressive disorder, single episode, mild ([place])  F32.0      Plan:  Patient not signing tx plan on computer screen due to [place].   Treatment Goals: Goals will remain on tx plan as patient works on strategies to achieve her goals.  Progress or lack of, will be documented each week in Progress section of Plan.  Long-Term goal:  1. Enhance the ability to handle effectively the full variety of life's anxieties.     Strategies:    Patient will work on re-framing thoughts that have been leading her to feel more anxious.  The re-framed thoughts will support more calmness and personal empowerment for patient.         Short Term goal:  Increase understanding of beliefs and messages that produce worry and anxiety.  Strategies: Patient will work with therapist to gain more understanding of how anxiety and worry are supported by certain beliefs and messages.  She will be able to give at least 3 examples of such messages in an upcoming session.  Progress: Patient is making progress on this strategy by attending appts, being engaged in our work together, and discussing her difficulties with making change.  She is cautiously motivated as she struggles with self-doubt and has "worried a lot for a long time!"  Encouraged her that her showing up and working on the goals is progress. Patient still worrying a lot  but reports on the 1-10 scale, that her worrying has gone down from a 9, to a 7 currently.   Next appt within 2 weeks.   Shanon Ace, LCSW

## 2019-07-06 DIAGNOSIS — H524 Presbyopia: Secondary | ICD-10-CM | POA: Diagnosis not present

## 2019-07-06 DIAGNOSIS — H31002 Unspecified chorioretinal scars, left eye: Secondary | ICD-10-CM | POA: Diagnosis not present

## 2019-07-06 DIAGNOSIS — Z961 Presence of intraocular lens: Secondary | ICD-10-CM | POA: Diagnosis not present

## 2019-07-06 DIAGNOSIS — Z79899 Other long term (current) drug therapy: Secondary | ICD-10-CM | POA: Diagnosis not present

## 2019-07-14 ENCOUNTER — Other Ambulatory Visit: Payer: Self-pay

## 2019-07-14 ENCOUNTER — Ambulatory Visit (INDEPENDENT_AMBULATORY_CARE_PROVIDER_SITE_OTHER): Payer: Medicare Other | Admitting: Psychiatry

## 2019-07-14 DIAGNOSIS — F320 Major depressive disorder, single episode, mild: Secondary | ICD-10-CM

## 2019-07-14 NOTE — Progress Notes (Signed)
[facility] Counselor/Therapist Progress Note  Patient ID: Andrea Burke, MRN: [id number],    Date: 07/14/2019  Time Spent: 60 minutes   10:00am to 11:00am  Treatment Type: Individual Therapy  Reported Symptoms:  Depression, anxiety, frustration, easily distrated (can make myself refocus)  Mental Status Exam:  Appearance:   Neat     Behavior:  Appropriate and Sharing  Motor:  Normal  Speech/Language:   Normal Rate  Affect:  Depressed  Mood:  anxious and depressed  Thought process:  normal  Thought content:    WNL  Sensory/Perceptual disturbances:    WNL  Orientation:  oriented to person, place, time/date, situation, day of week, month of year and year  Attention:  Good  Concentration:  Good  Memory:  I sometimes forget things, or forget names.  Fund of knowledge:   Good  Insight:    Fair  Judgment:   Good  Impulse Control:  Good   Risk Assessment: Danger to Self:  No Self-injurious Behavior: No Danger to Others: No Duty to Warn:no Physical Aggression / Violence:No  Access to Firearms a concern: No  Gang Involvement:No   Subjective:  Feels "blah"/depressed. Sleep has been some better on most nights.   Interventions: Cognitive Behavioral Therapy and Solution-Oriented/Positive Psychology  Diagnosis:   ICD-10-CM   1. Major depressive disorder, single episode, mild ([place])  F32.0      Plan:  Patient not signing tx plan on computer screen due to [place].   Treatment Goals: Goals will remain on tx plan as patient works on strategies to achieve her goals.  Progress or lack of, will be documented each week in Progress section of Plan.  Long-Term goal:   Enhance the ability to handle effectively the full variety of life's anxieties.  Strategies: Patient will work on re-framing thoughts that have been leading her to feel more anxious.  The re-framed thoughts will support more calmness and personal empowerment for patient.  Short Term goal:  Increase understanding of beliefs and messages that produce worry and anxiety.  Strategies: Patient will work with therapist to gain more understanding of how anxiety and worry are supported by certain beliefs and messages. She will be able to give at least 3 examples of such messages in an upcoming session.  Progress: Patient present an on time today for appt today, but reports having difficulty being motivated on goals.  She feels goals are appropriate for her but just hard to do things differently or to do something new. She acknowledged that she feels "down about myself" (not specific).  Worked with patient on targeting some negative self-talk and work to change it.   Patient reports that she uses the sentence "I cannot do it" a lot in response to things she "should be able to to do but feels she cannot do things because that's what her mind tells her."  We challenged this is session today and she was able to realize the untruth in what she has been telling herself, and converted her negative self-talk into more positive and reality-based self-talk, that will hopefully help in self-motivation as well. She reports her worrying today on 1-10 scale is about a 7, which is same as last week. Patient seemed to connect and understand some better today about the connection between her negative thoughts and her negative feelings and lack of motivation.  Homework given on this and will follow up next session.  Goal review with some progress noted with patient.   Next  appt in 2-3 wks.   Shanon Ace, LCSW

## 2019-07-28 ENCOUNTER — Ambulatory Visit: Payer: Medicare Other | Admitting: Psychiatry

## 2019-08-03 ENCOUNTER — Other Ambulatory Visit: Payer: Self-pay

## 2019-08-03 ENCOUNTER — Ambulatory Visit (INDEPENDENT_AMBULATORY_CARE_PROVIDER_SITE_OTHER): Payer: Medicare Other | Admitting: Psychiatry

## 2019-08-03 DIAGNOSIS — F320 Major depressive disorder, single episode, mild: Secondary | ICD-10-CM | POA: Diagnosis not present

## 2019-08-03 NOTE — Progress Notes (Signed)
[facility] Counselor/Therapist Progress Note  Patient ID: SMRITHI NILSSON, MRN: [id number],    Date: 08/03/2019  Time Spent: 60 minutes 4:00pm to 5:00pm  Treatment Type: Individual Therapy  Reported Symptoms:  Depression, anxiety, nervousness, frustrated  Mental Status Exam:  Appearance:   Casual     Behavior:  Appropriate and Sharing  Motor:  Normal  Speech/Language:   Normal Rate  Affect:  Depressed  Mood:  anxious and depressed  Thought process:  goal directed  Thought content:    WNL  Sensory/Perceptual disturbances:    WNL  Orientation:  oriented to person, place, time/date, situation, day of week, month of year and year  Attention:  Good  Concentration:  Good  Memory:  patient reports some forgetfulness "but not excessive"  Fund of knowledge:   Good  Insight:    Good  Judgment:   Good  Impulse Control:  Good   Risk Assessment: Danger to Self:  No Self-injurious Behavior: No Danger to Others: No Duty to Warn:no Physical Aggression / Violence:No  Access to Firearms a concern: No  Gang Involvement:No   Subjective:  Patient in today reporting depression, anxiety, nervous, and frustrated.  Has done more specific goal work which will be documented below. Seems more comfortable and a little less stressed, participating more freely.  Interventions: Cognitive Behavioral Therapy and Ego-Supportive  Diagnosis:   ICD-10-CM   1. Major depressive disorder, single episode, mild ([place])  F32.0     Plan: Patient not signing tx plan on computer screen due to [place].  Treatment Goals: Goals will remain on tx plan as patient works on strategies to achieve her goals. Progress or lack of, will be documented each week in Progress section of Plan.  Long-Term goal:   Enhance the ability to handle effectively the full variety of life's anxieties.  Strategies: Patient will work on re-framing thoughtsthat have been leading her to feel more anxious. The  re-framed thoughts will support more calmness and personal empowerment for patient.  Short Term goal: Increase understanding of beliefs and messages that produce worry and anxiety.  Strategies: Patient will work with therapist to gain more understanding of how anxiety and worry are supported by certain beliefs and messages. She will be able to give at least 3 examples of such messages in an upcoming session.  Progress: Patient has been working on her goals especially her short term goal of better understanding beliefs and messages that produce worry and anxiety.  Is gradually understanding some of the connection between our thoughts and our feelings. She has been" intentionally turning off TV or changing channels when world chaos and violence news reports are on." Discussed some other strategies to help her generate more positive thoughts and feelings including going to the gym, walking, getting outside, playing with her pet dog named "Sophie" a maltese, good TV shows.  The re-framing of her negative/depressive thoughts is not easy for her but she has shown more progress these past couple of weeks. States she is needing to work harder on the part of "replacing my negative/depressive thoughts with a more realistic, positive thought."  Also focused on staying in the present.  To start a daily gratitude list and continue her thought monitoring and interrupting/replacing the negative/depressive thoughts with more positive, hopeful, and reality-based thoughts. Rates her current worry level (on a scale of 1-10) as a "6.5" which represents a 1/2 point lessening in her worry level.  Goal review and progress noted, as well as improved  motivation this week.   Next appt within 2 weeks.   Shanon Ace, LCSW

## 2019-08-11 ENCOUNTER — Ambulatory Visit: Payer: Medicare Other | Admitting: Psychiatry

## 2019-08-18 ENCOUNTER — Ambulatory Visit (INDEPENDENT_AMBULATORY_CARE_PROVIDER_SITE_OTHER): Payer: Medicare Other | Admitting: Psychiatry

## 2019-08-18 ENCOUNTER — Other Ambulatory Visit: Payer: Self-pay

## 2019-08-18 DIAGNOSIS — F320 Major depressive disorder, single episode, mild: Secondary | ICD-10-CM | POA: Diagnosis not present

## 2019-08-18 NOTE — Progress Notes (Signed)
[facility] Counselor/Therapist Progress Note  Patient ID: Andrea Burke, MRN: [id number],    Date: 08/18/2019  Time Spent: 60 minutes  9:00am to 10:00am  Treatment Type: Individual Therapy  Reported Symptoms:  Depression, some anxiety  Mental Status Exam:  Appearance:   Neat     Behavior:  Appropriate and Sharing  Motor:  Normal  Speech/Language:   Normal Rate  Affect:  Depressed  Mood:  anxious and depressed  Thought process:  normal  Thought content:    WNL  Sensory/Perceptual disturbances:    WNL  Orientation:  oriented to person, place, time/date, situation, day of week, month of year and year  Attention:  Good  Concentration:  Good  Memory:  WNL  Fund of knowledge:   Good  Insight:    Good  Judgment:   Good  Impulse Control:  Good   Risk Assessment: Danger to Self:  No Self-injurious Behavior: No Danger to Others: No Duty to Warn:no Physical Aggression / Violence:No  Access to Firearms a concern: No  Gang Involvement:No   Subjective:  Patient in today reporting she's "been good and bad".  "On the bad side, I was bothered by all the election conflict and behavior of President and bad things going on in the country, and concern about upcoming holidays and whether she can see family."  "On the good side, we've had some beautiful weather, able to get outside and do some yardwork, able to see 76 yr old grandson and family."   Interventions: Cognitive Behavioral Therapy and Solution-Oriented/Positive Psychology  Diagnosis:   ICD-10-CM   1. Major depressive disorder, single episode, mild ([place])  F32.0      Plan: Patient not signing tx plan on computer screen due to [place].  Treatment Goals: Goals will remain on tx plan as patient works on strategies to achieve her goals. Progress or lack of, will be documented each week in Progress section of Plan.  Long-Term goal:  Enhance the ability to handle effectively the full variety of life's  anxieties.  Strategies: Patient will work on re-framing thoughtsthat have been leading her to feel more anxious. The re-framed thoughts will support more calmness and personal empowerment for patient.  Short Term goal: Increase understanding of beliefs and messages that produce worry and anxiety.  Strategies: Patient will work with therapist to gain more understanding of how anxiety and worry are supported by certain beliefs and messages. She will be able to give at least 3 examples of such messages in an upcoming session.  Progress: Patient felt very stressed recently and most was reportedly due to the coronavirus threat, world chaos, and the election proceedings.  Processed these concerns today in detail.  Did follow through on some homework, specifically getting outside more, being with pets more, limiting exposure to world news on TV.  Focused on being able to positively affirm herself and work on this is session today, with examples. Better understanding how thoughts affect our feelings and has tried to recognize some triggers to her depressive feelings and tried to interrupt those thoughts---was successful one time since last appt.  Explained how she wasn't successful on some other occasions and we worked on strengthening her abilities to do this.  Will work on it more before next session and continue with prior homework as noted above.  Goal review and progress noted.     Next appt within 3 weeks.   Shanon Ace, LCSW

## 2019-08-31 ENCOUNTER — Ambulatory Visit (INDEPENDENT_AMBULATORY_CARE_PROVIDER_SITE_OTHER): Payer: Medicare Other | Admitting: Psychiatry

## 2019-08-31 ENCOUNTER — Other Ambulatory Visit: Payer: Self-pay

## 2019-08-31 DIAGNOSIS — F320 Major depressive disorder, single episode, mild: Secondary | ICD-10-CM | POA: Diagnosis not present

## 2019-08-31 NOTE — Progress Notes (Signed)
      [facility] Counselor/Therapist Progress Note  Patient ID: Andrea Burke, MRN: [id number],    Date: 08/31/2019  Time Spent: 60 mintues  9:00am to 10:00am  Treatment Type: Individual Therapy  Reported Symptoms: depression, anxiety, sadness re: her pet dog who is quite ill  Mental Status Exam:  Appearance:   Well Groomed     Behavior:  Appropriate and Sharing  Motor:  Normal  Speech/Language:   Normal Rate  Affect:  Depressed and anxious  Mood:  anxious and depressed  Thought process:  goal directed  Thought content:    WNL  Sensory/Perceptual disturbances:    WNL  Orientation:  oriented to person, place, time/date, situation, day of week, month of year and year  Attention:  Good  Concentration:  Good  Memory:  Patient reports some forgetfulness and that her PCP is aware.  Fund of knowledge:   Good  Insight:    Good  Judgment:   Good  Impulse Control:  Good   Risk Assessment: Danger to Self:  No Self-injurious Behavior: No Danger to Others: No Duty to Warn:no Physical Aggression / Violence:No  Access to Firearms a concern: No  Gang Involvement:No   Subjective:  Patient in today with depression and anxiety, and sadness re: health concerns with their pet dog  Interventions: Cognitive Behavioral Therapy and Solution-Oriented/Positive Psychology  Diagnosis:   ICD-10-CM   1. Major depressive disorder, single episode, mild ([place])  F32.0      Plan: Patient not signing tx plan on computer screen due to [place].  Treatment Goals: Goals will remain on tx plan as patient works on strategies to achieve her goals. Progress or lack of, will be documented each week in Progress section of Plan.  Long-Term goal:  Enhance the ability to handle effectively the full variety of life's anxieties.  Strategies: Patient will work on re-framing thoughtsthat have been leading her to feel more anxious. The re-framed thoughts will support more calmness and personal  empowerment for patient.  Short Term goal: Increase understanding of beliefs and messages that produce worry and anxiety.  Strategies: Patient will work with therapist to gain more understanding of how anxiety and worry are supported by certain beliefs and messages. She will be able to give at least 3 examples of such messages in an upcoming session.  Progress: Patient following through on last session, explaining she has done homework and was successful on some occasions and not on other occasions in trying to intercept depressed or anxious thoughts and replace them with more positive, reality-based, and empowering thoughts. Says it is harder to do that  You would think, but she did share a couple examples where she successfully  Interrupted depressive/negative thoughts and  "replaced them with brighter, more hopeful", and realistic thoughts, and did notice that she did not feel as depressed on those occasions. Patient adds "however it is really hard to catch all my worrying and depressed thoughts".  To keep working on this before next appt and we will review again.  Also discussed some of her deep sadness about a pet dog she has had a long time and is quite ill and prognosis is very uncertain.  Worked with her grief and fears on this situation and seemed calmer by session end.  Goal review and progress noted with patient.   Next appt within 2-3 weeks.   Shanon Ace, LCSW

## 2019-09-10 ENCOUNTER — Other Ambulatory Visit: Payer: Self-pay

## 2019-09-10 ENCOUNTER — Ambulatory Visit (INDEPENDENT_AMBULATORY_CARE_PROVIDER_SITE_OTHER): Payer: Medicare Other | Admitting: Psychiatry

## 2019-09-10 ENCOUNTER — Encounter: Payer: Self-pay | Admitting: Psychiatry

## 2019-09-10 DIAGNOSIS — F419 Anxiety disorder, unspecified: Secondary | ICD-10-CM

## 2019-09-10 DIAGNOSIS — F320 Major depressive disorder, single episode, mild: Secondary | ICD-10-CM

## 2019-09-10 MED ORDER — VIIBRYD 40 MG PO TABS: ORAL_TABLET | ORAL | 0 refills | Status: DC

## 2019-09-10 NOTE — Progress Notes (Signed)
[name] [id number] Apr 07, 1946 73 y.o.  Subjective:   Patient ID:  Andrea Burke is a 73 y.o. (DOB [birth year]) female.  Chief Complaint:  Chief Complaint  Patient presents with  . Follow-up    Depression, Anxiety    HPI [name] presents to the office today for follow-up of depression and anxiety. She reports that her depression has not been as severe and not having "spikes"/flares of depression and describes a chronic low level depression. Continued anhedonia. Reports that anxiety has been "up and down." Reports anxiety with highway driving. She reports some worry. Denies physical s/s of anxiety. Reports that she continues to feel tired frequently. Motivation is slightly low and has not completed tasks that she had planned to do. Concentration is decreased. She reports that sleep is good overall. Reports inconsistent sleep schedule and sometimes sleeping in intervals. Some improvement in vivid dreams and nightmares. Appetite has been "off and on." She reports that she alters from not wanting to eat to "binge eating." Denies SI.   Has been going to the gym.   Past medication trials: Remeron-vivid dreams, increased appetite Lexapro-nightmares Sertraline-agitation, insomnia, impaired concentration Paxil-intolerable side effects Fluoxetine-intolerable side effects Viibryd- Partial improvement on 20 mg. Does not want to take doses above 20 mg. Wellbutrin- Severe tremors Lithium Hydroxyzine Xanax Deplin  Review of Systems:  Review of Systems  Gastrointestinal: Positive for diarrhea.  Musculoskeletal: Negative for gait problem.       Injured arm when moving something at her house.   Neurological: Positive for tremors.  Psychiatric/Behavioral:       Please refer to HPI    Medications: I have reviewed the patient's current medications.  Current Outpatient Medications  Medication Sig Dispense Refill  . BUTALBITAL-ACETAMINOPHEN PO Take by mouth.    .  Calcium Carbonate-Vitamin D (CALTRATE 600+D PO) Take by mouth.    . finasteride (PROSCAR) 5 MG tablet Take 2.5 mg by mouth daily.    . fish oil-omega-3 fatty acids 1000 MG capsule Take 2 g by mouth daily.    . hydroxychloroquine (PLAQUENIL) 200 MG tablet Take by mouth daily.    . Multiple Vitamins-Minerals (CENTRUM PO) Take by mouth.    . Vilazodone HCl (VIIBRYD) 40 MG TABS Take 1/2-1 tab po qd 90 tablet 0  . ALPRAZolam (XANAX) 0.25 MG tablet Take 0.25 mg by mouth daily as needed.      No current facility-administered medications for this visit.     Medication Side Effects: None  Allergies:  Allergies  Allergen Reactions  . Doxycycline Calcium   . Penicillins     Past Medical History:  Diagnosis Date  . Cancer (HCC)    KIDNEY  . Discoid lupus   . Endometriosis   . Headache(784.0)   . Insomnia   . Kidney disease   . Lupus ([place])   . Osteopenia   . Urinary incontinence     Family History  Problem Relation Age of Onset  . Hypertension Mother   . Hyperlipidemia Mother   . Tremor Mother   . Breast cancer Sister   . Hyperlipidemia Father   . Hypertension Father   . Dementia Father   . Tremor Father   . Cancer Sister   . Cancer Other     Social History   Socioeconomic History  . Marital status: Married    Spouse name: Not on file  . Number of children: Not on file  . Years of education: Not on file  . Highest  education level: Not on file  Occupational History  . Not on file  Social Needs  . Financial resource strain: Not on file  . Food insecurity    Worry: Not on file    Inability: Not on file  . Transportation needs    Medical: Not on file    Non-medical: Not on file  Tobacco Use  . Smoking status: Former Research scientist (life sciences)  . Smokeless tobacco: Never Used  Substance and Sexual Activity  . Alcohol use: Yes    Comment: rare  . Drug use: Not on file  . Sexual activity: Never    Birth control/protection: Post-menopausal  Lifestyle  . Physical activity    Days  per week: Not on file    Minutes per session: Not on file  . Stress: Not on file  Relationships  . Social Herbalist on phone: Not on file    Gets together: Not on file    Attends religious service: Not on file    Active member of club or organization: Not on file    Attends meetings of clubs or organizations: Not on file    Relationship status: Not on file  . Intimate partner violence    Fear of current or ex partner: Not on file    Emotionally abused: Not on file    Physically abused: Not on file    Forced sexual activity: Not on file  Other Topics Concern  . Not on file  Social History Narrative  . Not on file    Past Medical History, Surgical history, Social history, and Family history were reviewed and updated as appropriate.   Please see review of systems for further details on the patient's review from today.   Objective:   Physical Exam:  There were no vitals taken for this visit.  Physical Exam Constitutional:      General: She is not in acute distress.    Appearance: She is well-developed.  Musculoskeletal:        General: No deformity.  Neurological:     Mental Status: She is alert and oriented to person, place, and time.     Coordination: Coordination normal.  Psychiatric:        Attention and Perception: Attention and perception normal. She does not perceive auditory or visual hallucinations.        Mood and Affect: Mood is anxious and depressed. Affect is not labile, blunt, angry or inappropriate.        Speech: Speech normal.        Behavior: Behavior normal.        Thought Content: Thought content normal. Thought content is not paranoid or delusional. Thought content does not include homicidal or suicidal ideation. Thought content does not include homicidal or suicidal plan.        Cognition and Memory: Cognition and memory normal.        Judgment: Judgment normal.     Comments: Insight intact. No delusions.      Lab Review:      Component Value Date/Time   NA 138 07/31/2014 1222   K 4.2 07/31/2014 1222   CL 107 07/31/2014 1222   CO2 24 07/31/2014 1222   GLUCOSE 88 07/31/2014 1222   BUN 11 07/31/2014 1222   CREATININE 0.72 07/31/2014 1222   CALCIUM 9.5 07/31/2014 1222   PROT 7.5 07/31/2014 1222   ALBUMIN 4.3 07/31/2014 1222   AST 21 07/31/2014 1222   ALT 17 07/31/2014 1222  ALKPHOS 54 07/31/2014 1222   BILITOT 0.6 07/31/2014 1222       Component Value Date/Time   WBC 3.7 (L) 09/29/2018 0951   RBC 4.86 09/29/2018 0951   HGB 14.1 09/29/2018 0951   HCT 41.8 09/29/2018 0951   PLT 183 09/29/2018 0951   MCV 86.0 09/29/2018 0951   MCV 86.6 07/31/2014 1222   MCH 29.0 09/29/2018 0951   MCHC 33.7 09/29/2018 0951   RDW 12.2 09/29/2018 0951   LYMPHSABS 1,887 09/29/2018 0951   EOSABS 122 09/29/2018 0951   BASOSABS 30 09/29/2018 0951    No results found for: POCLITH, LITHIUM   No results found for: PHENYTOIN, PHENOBARB, VALPROATE, CBMZ   .res Assessment: Plan:   Pt seen for 30 minutes and greater than 50% of visit spent counseling pt re: tx options and she reports that she would prefer to continue current medication at this time since she has had a partial improvement in anxiety and depressive s/s. Counseled pt re: light therapy and blocking blue light later in the day to improve mood s/s, particularly since she reports that her mood is affected by the seasons and the weather and she has had tolerability issues with multiple medications.  Recommend continuing psychotherapy with Rinaldo Cloud, LCSW.  Pt to f/u in 3 months or sooner if clinically indicated.  Patient advised to contact office with any questions, adverse effects, or acute worsening in signs and symptoms.  Derion was seen today for follow-up.  Diagnoses and all orders for this visit:  Major depressive disorder, single episode, mild (HCC) -     Vilazodone HCl (VIIBRYD) 40 MG TABS; Take 1/2-1 tab po qd  Anxiety disorder, unspecified type -      Vilazodone HCl (VIIBRYD) 40 MG TABS; Take 1/2-1 tab po qd     Please see After Visit Summary for patient specific instructions.  Future Appointments  Date Time Provider [place]  09/23/2019 10:00 AM Shanon Ace, LCSW [facility] None  10/13/2019  9:00 AM Shanon Ace, LCSW [facility] None  11/03/2019 10:00 AM Shanon Ace, LCSW [facility] None  11/23/2019  9:00 AM Thayer Headings, PMHNP [facility] None    No orders of the defined types were placed in this encounter.   -------------------------------

## 2019-09-23 ENCOUNTER — Ambulatory Visit (INDEPENDENT_AMBULATORY_CARE_PROVIDER_SITE_OTHER): Payer: Medicare Other | Admitting: Psychiatry

## 2019-09-23 ENCOUNTER — Other Ambulatory Visit: Payer: Self-pay

## 2019-09-23 DIAGNOSIS — F320 Major depressive disorder, single episode, mild: Secondary | ICD-10-CM | POA: Diagnosis not present

## 2019-09-23 NOTE — Progress Notes (Signed)
[facility] Counselor/Therapist Progress Note  Patient ID: Andrea Burke, MRN: [id number],    Date: 09/23/2019  Time Spent: 60 minutes 10:00am to 11:00am  Treatment Type: Individual Therapy  Reported Symptoms: anxiety, sadness, depression, frustration  Mental Status Exam:  Appearance:   Neat     Behavior:  Appropriate and Sharing  Motor:  Normal  Speech/Language:   Normal Rate  Affect:  anxious, frustrated, tearful, depression  Mood:  anxious and depressed  Thought process:  goal directed  Thought content:    WNL  Sensory/Perceptual disturbances:    WNL  Orientation:  oriented to person, place, time/date, situation, day of week, month of year and year  Attention:  Good  Concentration:  Good  Memory:  WNL  Fund of knowledge:   Good  Insight:    Good  Judgment:   Good  Impulse Control:  Good   Risk Assessment: Danger to Self:  No Self-injurious Behavior: No Danger to Others: No Duty to Warn:no Physical Aggression / Violence:No  Access to Firearms a concern: No  Gang Involvement:No   Subjective: Patient in today and reports anxiety, depressed, sadness at times, frustrated.  The whole COVID situation has exacerbated her symptoms per her report.  Interventions: Cognitive Behavioral Therapy and Ego-Supportive  Diagnosis:   ICD-10-CM   1. Major depressive disorder, single episode, mild ([place])  F32.0      Plan: Patient not signing tx plan on computer screen due to [place].  Treatment Goals: Goals will remain on tx plan as patient works on strategies to achieve her goals. Progress or lack of, will be documented each week in Progress section of Plan.  Long-Term goal:  Enhance the ability to handle effectively the full variety of life's anxieties.  Strategies: Patient will work on re-framing thoughtsthat have been leading her to feel more anxious. The re-framed thoughts will support more calmness and personal empowerment for  patient.  Short Term goal: Increase understanding of beliefs and messages that produce worry and anxiety.  Strategies: Patient will work with therapist to gain more understanding of how anxiety and worry are supported by certain beliefs and messages. She will be able to give at least 3 examples of such messages in an upcoming session.  Progress: Patient progressing on her goals as she is trying to decrease her negative/anxious/depressive thought patterns and replace them with more positive, reality-based, and positive cognitions. She reports trying the identification and replacement strategies and that it is difficult for her to do on her own and does better with in when talking here in the office. Shares how the thoughts often come when she is feeling hurt by how people treat each other.  Used that example to work on her negative, anxious thoughts.  Patient able to share how her thoughts emerge from thinking about distressful or sad things and shared she agrees that she needs to limit some of the news watching she does online or TV.  Practiced some of the thought interruptions and replacements with a couple of the anxious/negative/sad thoughts that she has experienced recently.  She is to try practicing the same strategy on her own and being able to complete the thought replacement part of the strategy.   Was much more open today in her talking and seemed to feel less sad and more grounded upon leaving.  Goal review and progress/efforts noted with patient.     Next appt within 2-3 weeks.   Shanon Ace, LCSW

## 2019-10-13 ENCOUNTER — Other Ambulatory Visit: Payer: Self-pay

## 2019-10-13 ENCOUNTER — Ambulatory Visit (INDEPENDENT_AMBULATORY_CARE_PROVIDER_SITE_OTHER): Payer: Medicare Other | Admitting: Psychiatry

## 2019-10-13 DIAGNOSIS — F419 Anxiety disorder, unspecified: Secondary | ICD-10-CM

## 2019-10-13 NOTE — Progress Notes (Signed)
[facility] Counselor/Therapist Progress Note  Patient ID: Andrea Burke, MRN: [id number],    Date: 10/13/2019  Time Spent: 60 minutes   9:00am to 10:00am  Treatment Type: Individual Therapy  Reported Symptoms: anxiety, depression, some restlessness especially at night in settling down to go to bed  Mental Status Exam:  Appearance:   Casual     Behavior:  Appropriate, Sharing and Motivated  Motor:  Normal  Speech/Language:   Normal Rate  Affect:  anxious, depressed  Mood:  anxious and depressed  Thought process:  normal  Thought content:    WNL  Sensory/Perceptual disturbances:    WNL  Orientation:  oriented to person, place, time/date, situation, day of week, month of year and year  Attention:  Good  Concentration:  Good  Memory:  occasional forgetfulness  Fund of knowledge:   Good  Insight:    Good  Judgment:   Good  Impulse Control:  Good   Risk Assessment: Danger to Self:  No Self-injurious Behavior: No Danger to Others: No Duty to Warn:no Physical Aggression / Violence:No  Access to Firearms a concern: No  Gang Involvement:No   Subjective:   Patient in today reporting anxiety and depression but "am a little bit better".  Good visit at Christmas with mother.  Feeling some better as she remains working on her goals.  Interventions: Cognitive Behavioral Therapy and Solution-Oriented/Positive Psychology  Diagnosis:   ICD-10-CM   1. Anxiety disorder, unspecified type  F41.9     Plan: Patient not signing tx plan on computer screen due to [place].  Treatment Goals: Goals will remain on tx plan as patient works on strategies to achieve her goals. Progress or lack of, will be documented each week in Progress section of Plan.  Long-Term goal:  Enhance the ability to handle effectively the full variety of life's anxieties.  Strategies: Patient will work on re-framing thoughtsthat have been leading her to feel more anxious. The re-framed  thoughts will support more calmness and personal empowerment for patient.  Short Term goal: Increase understanding of beliefs and messages that produce worry and anxiety.  Strategies: Patient will work with therapist to gain more understanding of how anxiety and worry are supported by certain beliefs and messages. She will be able to give at least 3 examples of such messages in an upcoming session.  Progress: Patient continues to monitor her thoughts and tries to intercept her anxious, negative, and depressive thoughts.  She rank orders them in this order according to their strength, with anxious thoughts being the most frequently occurring.  Patient does seem to really be trying consistently with her goal and states this session that she is making some progress.  Acknowledges that she needs to stick with strategies more and "not let my mind wander", and instead "focus more on what I can do versus what may happen."  We continued working today on her thought patterns, specifically working with several examples of more problematic anxious and negative thoughts, working to replace them with more positive, reality-based, and empowering thoughts. The replacement part is the more difficult piece for her, as she is better at identifying negative/anxious/depressive thoughts however is definitely progressing and noticing some difference as she works on this.Following up from last session, patient has decreased her TV and online time of watching troublesome news reports.  Less sad but still concerned about situations.  More calm and grounded at session end and continues to be more open and engaged during session.  Goal review and progress/efforts noted with patient.     Next visit to be within 2-3 weeks.   Shanon Ace, LCSW

## 2019-10-15 DIAGNOSIS — H11441 Conjunctival cysts, right eye: Secondary | ICD-10-CM | POA: Diagnosis not present

## 2019-11-03 ENCOUNTER — Ambulatory Visit (INDEPENDENT_AMBULATORY_CARE_PROVIDER_SITE_OTHER): Payer: Medicare Other | Admitting: Psychiatry

## 2019-11-03 ENCOUNTER — Other Ambulatory Visit: Payer: Self-pay

## 2019-11-03 DIAGNOSIS — F419 Anxiety disorder, unspecified: Secondary | ICD-10-CM | POA: Diagnosis not present

## 2019-11-03 NOTE — Progress Notes (Signed)
      [facility] Counselor/Therapist Progress Note  Patient ID: Andrea Burke, MRN: [id number],    Date: 11/03/2019  Time Spent: 60 minutes  10:00am to 11:00am  Treatment Type: Individual Therapy  Reported Symptoms: anxiety, depressed  Mental Status Exam:  Appearance:   Well Groomed     Behavior:  Appropriate and Sharing  Motor:  Normal  Speech/Language:   Normal Rate  Affect:  anxiety, some depression  Mood:  anxious and depressed  Thought process:  normal  Thought content:    WNL  Sensory/Perceptual disturbances:    WNL  Orientation:  oriented to person, place, time/date, situation, day of week, month of year and year  Attention:  Good  Concentration:  Good  Memory:  WNL  Fund of knowledge:   Good  Insight:    Good  Judgment:   Good  Impulse Control:  Good   Risk Assessment: Danger to Self:  No Self-injurious Behavior: No Danger to Others: No Duty to Warn:no Physical Aggression / Violence:No  Access to Firearms a concern: No  Gang Involvement:No   Subjective: Patient in today reporting anxiety and some depression, with some good moments and bad moments.  Worrying a lot. Trying to make some new habits that will also help her emotionally such as completing tasks, throwing things away to avoid excessive clutter.   Interventions: Cognitive Behavioral Therapy and Solution-Oriented/Positive Psychology  Diagnosis:   ICD-10-CM   1. Anxiety disorder, unspecified type  F41.9      Plan: Patient not signing tx plan on computer screen due to [place].  Treatment Goals: Goals will remain on tx plan as patient works on strategies to achieve her goals. Progress or lack of, will be documented each week in Progress section of Plan.  Long-Term goal:  Enhance the ability to handle effectively the full variety of life's anxieties.  Strategies: Patient will work on re-framing thoughtsthat have been leading her to feel more anxious. The re-framed thoughts will  support more calmness and personal empowerment for patient.  Short Term goal: Increase understanding of beliefs and messages that produce worry and anxiety.  Strategies: Patient will work with therapist to gain more understanding of how anxiety and worry are supported by certain beliefs and messages. She will be able to give at least 3 examples of such messages in an upcoming session.  Progress: Problems with "how I think about certain things include worrying, looking for what might go wrong versus right, making false assumptions, and not staying in the present."  She was able to give examples of each of these today and we worked more intently on her thoughts, especially the anxious/depressive thoughts, and work to replace them with more positive, reality-based, hopeful, and empowering thoughts that do not support anxiety nor depression.  Practiced this in session today.  Goal review and progress/challenges noted with patient.    Next appt within 2 minutes.   Shanon Ace, LCSW

## 2019-11-06 ENCOUNTER — Ambulatory Visit: Payer: Medicare Other

## 2019-11-11 ENCOUNTER — Ambulatory Visit: Payer: Medicare Other

## 2019-11-14 ENCOUNTER — Ambulatory Visit: Payer: Medicare Other | Attending: Internal Medicine

## 2019-11-14 DIAGNOSIS — Z23 Encounter for immunization: Secondary | ICD-10-CM

## 2019-11-14 NOTE — Progress Notes (Signed)
   Covid-19 Vaccination Clinic  Name:  Andrea Burke    MRN: [id number] DOB: Jan 08, 1946  11/14/2019  Andrea Burke was observed post Covid-19 immunization for 15 minutes without incidence. She was provided with Vaccine Information Sheet and instruction to access the V-Safe system.   Andrea Burke was instructed to call 911 with any severe reactions post vaccine: [name] Kitchen Difficulty breathing  . Swelling of your face and throat  . A fast heartbeat  . A bad rash all over your body  . Dizziness and weakness    Immunizations Administered    Name Date Dose VIS Date Route   Pfizer COVID-19 Vaccine 11/14/2019  3:38 PM 0.3 mL 09/18/2019 Intramuscular   Manufacturer: [place]   Lot: [id number]   Andrea Burke: [id number]

## 2019-11-17 ENCOUNTER — Ambulatory Visit (INDEPENDENT_AMBULATORY_CARE_PROVIDER_SITE_OTHER): Payer: Medicare Other | Admitting: Psychiatry

## 2019-11-17 ENCOUNTER — Other Ambulatory Visit: Payer: Self-pay

## 2019-11-17 DIAGNOSIS — F419 Anxiety disorder, unspecified: Secondary | ICD-10-CM

## 2019-11-17 NOTE — Progress Notes (Signed)
[facility] Counselor/Therapist Progress Note  Patient ID: Andrea Burke, MRN: [id number],    Date: 11/17/2019  Time Spent: 60 minutes  10:00am to 11:00am  Treatment Type: Individual Therapy  Reported Symptoms:  Anxiety but also "feeling a little lighter", not quite as distressed, some depression (mild)  Mental Status Exam:  Appearance:   Casual     Behavior:  Appropriate and Sharing  Motor:  Normal  Speech/Language:   Normal Rate  Affect:  anxiety  Mood:  anxious  Thought process:  normal  Thought content:    WNL  Sensory/Perceptual disturbances:    WNL  Orientation:  oriented to person, place, time/date, situation, day of week, month of year and year  Attention:  Good  Concentration:  Good  Memory:  some forgetfulnees "but not daily"  Fund of knowledge:   Good  Insight:    Good  Judgment:   Good  Impulse Control:  Good   Risk Assessment: Danger to Self:  No Self-injurious Behavior: No Danger to Others: No Duty to Warn:no Physical Aggression / Violence:No  Access to Firearms a concern: No  Gang Involvement:No   Subjective: Patient in today reporting a little better with her anxiety, feeling a little lighter, and some depression (mild).  Not having that feeling that "everything is pressing down on me". Got vaccine shot.  Husband broke his foot.  Interventions: Cognitive Behavioral Therapy and Solution-Oriented/Positive Psychology  Diagnosis:   ICD-10-CM   1. Anxiety disorder, unspecified type  F41.9     Plan: Patient not signing tx plan on computer screen due to [place].  Treatment Goals: Goals will remain on tx plan as patient works on strategies to achieve her goals. Progress or lack of, will be documented each week in Progress section of Plan.  Long-Term goal:  Enhance the ability to handle effectively the full variety of life's anxieties.  Strategies: Patient will work on re-framing thoughtsthat have been leading her to feel more  anxious. The re-framed thoughts will support more calmness and personal empowerment for patient.  Short Term goal: Increase understanding of beliefs and messages that produce worry and anxiety.  Strategies: Patient will work with therapist to gain more understanding of how anxiety and worry are supported by certain beliefs and messages. She will be able to give at least 3 examples of such messages in an upcoming session.  Progress: Patient in today and report she recently got her first Covid vaccine shot and all went well. To get 2nd one within 2-3 weeks. Husband broke his foot and also had "trigger finger" surgery.  Reports that her worrying has decreased some and attributes this to "fewer things to worry about."  States I know I have that tendency to worry and it's a habit for me, although recognized that she did feel less stressed with "fewer things to worry about past week."  Discussed her tendency to "worry as it makes me feel more watchful and prepared."  Also goes hand in had with her "looking for what might go wrong verus right."  Processed some of her anxious thoughts and practiced again the exercise of interrupting them and replacing them with more positive, realistic, and empowering thought patterns that do not lead to anxiety nor depression. Worked on her habit of worrying and is to try one step in catching  herself looking for what might go wrong and change her focus to what might go right, in efforts to develop the habit of staying more in the  present and looking for the "positives" (what might go right). Goal review and progress noted with patient.    Next appt within 2-3 weeks.   Shanon Ace, LCSW

## 2019-11-23 ENCOUNTER — Ambulatory Visit (INDEPENDENT_AMBULATORY_CARE_PROVIDER_SITE_OTHER): Payer: Medicare Other | Admitting: Psychiatry

## 2019-11-23 ENCOUNTER — Encounter: Payer: Self-pay | Admitting: Psychiatry

## 2019-11-23 ENCOUNTER — Other Ambulatory Visit: Payer: Self-pay

## 2019-11-23 DIAGNOSIS — F419 Anxiety disorder, unspecified: Secondary | ICD-10-CM | POA: Diagnosis not present

## 2019-11-23 DIAGNOSIS — F320 Major depressive disorder, single episode, mild: Secondary | ICD-10-CM

## 2019-11-23 MED ORDER — VIIBRYD 40 MG PO TABS: ORAL_TABLET | ORAL | 0 refills | Status: DC

## 2019-11-23 NOTE — Progress Notes (Signed)
Andrea Burke:[id number] September 05, 1946 74 y.o.  Subjective:   Patient ID:  Andrea Burke is a 74 y.o. (DOB Nov 25, 1945) female.  Chief Complaint:  Chief Complaint  Patient presents with  . Depression  . Anxiety  . Insomnia    HPI Andrea Burke presents to the office today for follow-up of depression and anxiety. She reports that she is not feeling as "gray and heavy" and feeling more hopeful with changes in politics and getting first vaccination. She reports that she continues to have some depression but not as severe as it has been. She reports that anxiety also has been less severe. She reports some worry and anxious thoughts and tries to redirect this. Sleep has improved. Has not been having nightmares and has been having some dreams about being in a rush at work at least once a week. She reports that appetite has been good and has been snacking frequently and has had some wt gain. Energy and motivation remain low. Reports that she continues to have low desire to do certain things to include self care and hygiene. Low interest and pleasure in things. Concentration has been poor. Denies SI.   "A little better."   Has not been using Xanax prn  Remains on leave from work. Husband fx'd his foot. Husband was also having issues with his hand and has been needing more assistance from her than usual. Has not been able to see other family as a result.    Past medication trials: Remeron-vivid dreams, increased appetite Lexapro-nightmares Sertraline-agitation, insomnia, impaired concentration Paxil-intolerable side effects Fluoxetine-intolerable side effects Viibryd- Partial improvement on 20 mg. Does not want to take doses above 20 mg. Wellbutrin- Severe tremors Lithium Hydroxyzine Xanax Deplin    Review of Systems:  Review of Systems  Musculoskeletal: Positive for arthralgias. Negative for gait problem.  Neurological: Negative for tremors.  Psychiatric/Behavioral:        Please refer to HPI    Medications: I have reviewed the patient's current medications.  Current Outpatient Medications  Medication Sig Dispense Refill  . BUTALBITAL-ACETAMINOPHEN PO Take by mouth.    . Calcium Carbonate-Vitamin D (CALTRATE 600+D PO) Take by mouth.    . finasteride (PROSCAR) 5 MG tablet Take 2.5 mg by mouth daily.    . fish oil-omega-3 fatty acids 1000 MG capsule Take 2 g by mouth daily.    . hydroxychloroquine (PLAQUENIL) 200 MG tablet Take by mouth daily.    . Multiple Vitamins-Minerals (CENTRUM PO) Take by mouth.    . Vilazodone HCl (VIIBRYD) 40 MG TABS Take 1/2-1 tab po qd 90 tablet 0  . ALPRAZolam (XANAX) 0.25 MG tablet Take 0.25 mg by mouth daily as needed.      No current facility-administered medications for this visit.    Medication Side Effects: None  Allergies:  Allergies  Allergen Reactions  . Doxycycline Calcium   . Penicillins     Past Medical History:  Diagnosis Date  . Cancer (HCC)    KIDNEY  . Discoid lupus   . Endometriosis   . Headache(784.0)   . Insomnia   . Kidney disease   . Lupus ([place])   . Osteopenia   . Urinary incontinence     Family History  Problem Relation Age of Onset  . Hypertension Mother   . Hyperlipidemia Mother   . Tremor Mother   . Breast cancer Sister   . Hyperlipidemia Father   . Hypertension Father   . Dementia Father   . Tremor  Father   . Cancer Sister   . Cancer Other     Social History   Socioeconomic History  . Marital status: Married    Spouse name: Not on file  . Number of children: Not on file  . Years of education: Not on file  . Highest education level: Not on file  Occupational History  . Not on file  Tobacco Use  . Smoking status: Former Research scientist (life sciences)  . Smokeless tobacco: Never Used  Substance and Sexual Activity  . Alcohol use: Yes    Comment: rare  . Drug use: Not on file  . Sexual activity: Never    Birth control/protection: Post-menopausal  Other Topics Concern  . Not on file   Social History Narrative  . Not on file   Social Determinants of Health   Financial Resource Strain:   . Difficulty of Paying Living Expenses: Not on file  Food Insecurity:   . Worried About Charity fundraiser in the Last Year: Not on file  . Ran Out of Food in the Last Year: Not on file  Transportation Needs:   . Lack of Transportation (Medical): Not on file  . Lack of Transportation (Non-Medical): Not on file  Physical Activity:   . Days of Exercise per Week: Not on file  . Minutes of Exercise per Session: Not on file  Stress:   . Feeling of Stress : Not on file  Social Connections:   . Frequency of Communication with Friends and Family: Not on file  . Frequency of Social Gatherings with Friends and Family: Not on file  . Attends Religious Services: Not on file  . Active Member of Clubs or Organizations: Not on file  . Attends Archivist Meetings: Not on file  . Marital Status: Not on file  Intimate Partner Violence:   . Fear of Current or Ex-Partner: Not on file  . Emotionally Abused: Not on file  . Physically Abused: Not on file  . Sexually Abused: Not on file    Past Medical History, Surgical history, Social history, and Family history were reviewed and updated as appropriate.   Please see review of systems for further details on the patient's review from today.   Objective:   Physical Exam:  There were no vitals taken for this visit.  Physical Exam Constitutional:      General: She is not in acute distress.    Appearance: She is well-developed.  Musculoskeletal:        General: No deformity.  Neurological:     Mental Status: She is alert and oriented to person, place, and time.     Coordination: Coordination normal.  Psychiatric:        Attention and Perception: Attention and perception normal. She does not perceive auditory or visual hallucinations.        Mood and Affect: Mood is anxious and depressed. Affect is not labile, blunt, angry or  inappropriate.        Speech: Speech normal.        Behavior: Behavior normal.        Thought Content: Thought content normal. Thought content is not paranoid or delusional. Thought content does not include homicidal or suicidal ideation. Thought content does not include homicidal or suicidal plan.        Cognition and Memory: Cognition and memory normal.        Judgment: Judgment normal.     Comments: Insight intact     Lab Review:  Component Value Date/Time   NA 138 07/31/2014 1222   K 4.2 07/31/2014 1222   CL 107 07/31/2014 1222   CO2 24 07/31/2014 1222   GLUCOSE 88 07/31/2014 1222   BUN 11 07/31/2014 1222   CREATININE 0.72 07/31/2014 1222   CALCIUM 9.5 07/31/2014 1222   PROT 7.5 07/31/2014 1222   ALBUMIN 4.3 07/31/2014 1222   AST 21 07/31/2014 1222   ALT 17 07/31/2014 1222   ALKPHOS 54 07/31/2014 1222   BILITOT 0.6 07/31/2014 1222       Component Value Date/Time   WBC 3.7 (L) 09/29/2018 0951   RBC 4.86 09/29/2018 0951   HGB 14.1 09/29/2018 0951   HCT 41.8 09/29/2018 0951   PLT 183 09/29/2018 0951   MCV 86.0 09/29/2018 0951   MCV 86.6 07/31/2014 1222   MCH 29.0 09/29/2018 0951   MCHC 33.7 09/29/2018 0951   RDW 12.2 09/29/2018 0951   LYMPHSABS 1,887 09/29/2018 0951   EOSABS 122 09/29/2018 0951   BASOSABS 30 09/29/2018 0951    No results found for: POCLITH, LITHIUM   No results found for: PHENYTOIN, PHENOBARB, VALPROATE, CBMZ   .res Assessment: Plan:   Patient seen for 30 minutes time spent discussing treatment plan.  Patient reports that she would like to ideally come off of all medications, however she has noticed some recent improvement and would like to continue Viibryd 20 mg daily until spring and then consider medication decrease if appropriate. Recommend continuing therapy with Rinaldo Cloud, LCSW. Patient to follow-up with this provider in 3 months or sooner if clinically indicated. Patient advised to contact office with any questions, adverse effects,  or acute worsening in signs and symptoms.  Waldine was seen today for depression, anxiety and insomnia.  Diagnoses and all orders for this visit:  Major depressive disorder, single episode, mild (HCC) -     Vilazodone HCl (VIIBRYD) 40 MG TABS; Take 1/2-1 tab po qd  Anxiety disorder, unspecified type -     Vilazodone HCl (VIIBRYD) 40 MG TABS; Take 1/2-1 tab po qd     Please see After Visit Summary for patient specific instructions.  Future Appointments  Date Time Provider [place]  11/30/2019 11:00 AM Shanon Ace, LCSW [facility] None  12/09/2019 11:45 AM [place] [facility]  12/21/2019 10:00 AM Shanon Ace, LCSW [facility] None  01/05/2020  3:00 PM Shanon Ace, LCSW CP[facility] None  01/19/2020 10:00 AM Shanon Ace, LCSW [facility] None  02/23/2020  9:00 AM Thayer Headings, PMHNP CP-CP None    No orders of the defined types were placed in this encounter.   -------------------------------

## 2019-11-30 ENCOUNTER — Other Ambulatory Visit: Payer: Self-pay

## 2019-11-30 ENCOUNTER — Ambulatory Visit (INDEPENDENT_AMBULATORY_CARE_PROVIDER_SITE_OTHER): Payer: Medicare Other | Admitting: Psychiatry

## 2019-11-30 DIAGNOSIS — F419 Anxiety disorder, unspecified: Secondary | ICD-10-CM

## 2019-11-30 NOTE — Progress Notes (Signed)
[facility] Counselor/Therapist Progress Note  Patient ID: Andrea Burke, MRN: [id number],    Date: 11/30/2019  Time Spent: 60 minutes   11:00am to 12:00am  Treatment Type: Individual Therapy  Reported Symptoms: anxiety, depression (improved some)  Mental Status Exam:  Appearance:   Well Groomed     Behavior:  Appropriate, Sharing and Motivated  Motor:  Normal  Speech/Language:   Normal Rate  Affect:  anxious, depressed  Mood:  anxious, depressed and sad  Thought process:  goal directed  Thought content:    WNL  Sensory/Perceptual disturbances:    WNL  Orientation:  oriented to person, place, time/date, situation, day of week, month of year and year  Attention:  Good  Concentration:  Good  Memory:  WNL  Fund of knowledge:   Good  Insight:    Good  Judgment:   Good  Impulse Control:  Good   Risk Assessment: Danger to Self:  No Self-injurious Behavior: No Danger to Others: No Duty to Warn:no Physical Aggression / Violence:No  Access to Firearms a concern: No  Gang Involvement:No   Subjective: Patient in today reporting improvement as her anxiety has reduced some, depression has decreased, not stressing quite as much over things that she would normally stressed over a lot. Feels grateful for her progress. Got her first vaccine shot and to get her second one next week.  Feels good about that.    Interventions: Cognitive Behavioral Therapy and Solution-Oriented/Positive Psychology  Diagnosis:   ICD-10-CM   1. Anxiety disorder, unspecified type  F41.9     Plan: Patient not signing tx plan on computer screen due to [place].  Treatment Goals: Goals will remain on tx plan as patient works on strategies to achieve her goals. Progress or lack of, will be documented each week in Progress section of Plan.  Long-Term goal: (measurable) Enhance the ability to handle effectively the full variety of life's anxieties. Patient will eventually rate herself as a "3  or under on a 1-10 scale for depression and for anxiety, for a period of at least 2 months."  Strategies: Patient will work on re-framing thoughtsthat have been leading her to feel more anxious. The re-framed thoughts will support more calmness and personal empowerment for patient.  Short Term goal: Increase understanding of beliefs and messages that produce worry and anxiety.  Strategies: Patient will work with therapist to gain more understanding of how anxiety and worry are supported by certain beliefs and messages. She will be able to give at least 3 examples of such messages in an upcoming session.  Progress: Patient today reports some decrease in her anxiety and depression "just a little bit but that is good."  Some sadness expressed and shared with 3 of her friends having died within past couple weeks.  Already been up and did her grocery shopping this a.m. as she feels that is the safest time to go to stores. Some motivation with some things but not as motivated in things around the house and in tax prep. Discussed "taking charge and taking action in doing certain things she tends to put off, and how in doing this she can feel more proactive and positive rather than dreading and  putting them off. Patient discussed some of the programs she watches on TV including a news program recently that showed very graphic scenes from another country where masses of people had been killed. Encouraged her to limit the amount of time she spends watching this type  of programming as it feeds her anxiety and depression. Patient working hard to change her focus and look more for what might go right versus wrong, as she has tended to look more for the things that go wrong.  Does feel like her worrying has decreased some and I try not to "dwell on my worries and what-ifs". Also admits that it is difficult for her to let go of things out of her control. To continue her work on this as she also tries  to catch her anxious thoughts earlier and changes them to be more positive, self-assuring, hopeful, realistic, and empowering thought patterns. Rates herself as a "7 for anxiety and a 6 for depression today."  Goal review and progress noted with patient.    Next appt within 2-3 weeks.   Shanon Ace, LCSW

## 2019-12-09 ENCOUNTER — Ambulatory Visit: Payer: Medicare Other | Attending: Internal Medicine

## 2019-12-09 DIAGNOSIS — Z23 Encounter for immunization: Secondary | ICD-10-CM | POA: Insufficient documentation

## 2019-12-09 NOTE — Progress Notes (Signed)
   Covid-19 Vaccination Clinic  Name:  Andrea Burke    MRN: [id number] DOB: Aug 17, 1946  12/09/2019  [name] was observed post Covid-19 immunization for 15 minutes without incident. She was provided with Vaccine Information Sheet and instruction to access the V-Safe system.   [name] was instructed to call 911 with any severe reactions post vaccine: [name] Kitchen Difficulty breathing  . Swelling of face and throat  . A fast heartbeat  . A bad rash all over body  . Dizziness and weakness   Immunizations Administered    Name Date Dose VIS Date Route   Pfizer COVID-19 Vaccine 12/09/2019 11:41 AM 0.3 mL 09/18/2019 Intramuscular   Manufacturer: [place]   Lot: [id number]   [place]: [id number]

## 2019-12-21 ENCOUNTER — Ambulatory Visit (INDEPENDENT_AMBULATORY_CARE_PROVIDER_SITE_OTHER): Payer: Medicare Other | Admitting: Psychiatry

## 2019-12-21 ENCOUNTER — Other Ambulatory Visit: Payer: Self-pay

## 2019-12-21 DIAGNOSIS — F419 Anxiety disorder, unspecified: Secondary | ICD-10-CM

## 2019-12-21 NOTE — Progress Notes (Signed)
[facility] Counselor/Therapist Progress Note  Patient ID: Andrea Burke, MRN: [id number],    Date: 12/21/2019   Time Spent: 60 minutes   10:00am to 11:00am  Treatment Type: Individual Therapy  Reported Symptoms: anxiety, some depression, some less energy, "some days are better"  Mental Status Exam:  Appearance:   Well Groomed     Behavior:  Appropriate and Sharing  Motor:  Normal  Speech/Language:   Normal Rate  Affect:  anxious, depressed  Mood:  anxious and depressed  Thought process:  goal directed  Thought content:    WNL  Sensory/Perceptual disturbances:    WNL  Orientation:  oriented to person, place, time/date, situation, day of week, month of year and year  Attention:  Good  Concentration:  Fair  Memory:  WNL("later adds, not normally forgetful but did recently walk away from stove and forgot it momentarily but nothing bad happened."  [employer] of knowledge:   Good  Insight:    Good  Judgment:   Good  Impulse Control:  Good   Risk Assessment: Danger to Self:  No Self-injurious Behavior: No Danger to Others: No Duty to Warn:no Physical Aggression / Violence:No  Access to Firearms a concern: No  Gang Involvement:No   Subjective: Patient reports today that she has trouble maintaining gains in forking to alleviate her anxious/negative/depressive thoughts.  Does well with it in sessions but then difficult to hold onto.   Interventions: Cognitive Behavioral Therapy and Solution-Oriented/Positive Psychology  Diagnosis:   ICD-10-CM   1. Anxiety disorder, unspecified type  F41.9      Plan: Patient not signing tx plan on computer screen due to [place].  Treatment Goals: Goals will remain on tx plan as patient works on strategies to achieve her goals. Progress or lack of, will be documented each week in Progress section of Plan.  Long-Term goal: (measurable) Enhance the ability to handle effectively the full variety of life's anxieties. Patient will  eventually rate herself as a "3 or under on a 1-10 scale for depression and for anxiety, for a period of at least 2 months."  Strategies: Patient will work on re-framing thoughtsthat have been leading her to feel more anxious. The re-framed thoughts will support more calmness and personal empowerment for patient.  Short Term goal: Increase understanding of beliefs and messages that produce worry and anxiety.  Strategies: Patient will work with therapist to gain more understanding of how anxiety and worry are supported by certain beliefs and messages. She will be able to give at least 3 examples of such messages in an upcoming session.  Progress: Patient reports that after she left last visit she was "doing pretty good on not focusing as much on the darker, negative, anxious thoughts but then found that hard to maintain."  Rates self as a "6" on 1-10 scales for anxiety and depression.  Negative thoughts are still an issue for her and affect her motivation, increased sadness and worry, and sometimes end up tearful. Limiting her exposure to "upsetting local and world news that tends to feed her anxiety and depression."  Does miss seeing family during pandemic and looking forward to more frequent visits to family in the [place] area, especially her mother who is celebrating her 90th birthday this week. Shares that some of her depression and anxiety is related to not having more contact with family.  Has been calling her mother more often, almost daily now, and that is a good thing and starting to help the  anxieity some. Discussed other potential "positives" for patient that can help increase positive outcomes for her an reduce some of her anxiety/negativity.  Worked more today on recognizing her anxious and negative thoughts, using some of her own examples, and were able to replace them with more reality-based, positive, and empowering thoughts that do not support negativity nor anxiety.  She  states "this is hard habit to break" and we also acknowledged some of her progress and motivation.  Goal review and progress/challenges noted with patient.  Next appt within 2-3 weeks.   Shanon Ace, LCSW

## 2020-01-05 ENCOUNTER — Ambulatory Visit (INDEPENDENT_AMBULATORY_CARE_PROVIDER_SITE_OTHER): Payer: Medicare Other | Admitting: Psychiatry

## 2020-01-05 ENCOUNTER — Other Ambulatory Visit: Payer: Self-pay

## 2020-01-05 DIAGNOSIS — F419 Anxiety disorder, unspecified: Secondary | ICD-10-CM

## 2020-01-05 NOTE — Progress Notes (Signed)
[facility] Counselor/Therapist Progress Note  Patient ID: ZIANA NEMBHARD, MRN: [id number],    Date: 01/05/2020   Time Spent: 60 minutes   3:00pm to 4:00pm   Treatment Type: Individual Therapy  Reported Symptoms:  Anxiety (some improvement),  Frustrated with "everybody"  Mental Status Exam:  Appearance:   Casual     Behavior:  Appropriate and Sharing  Motor:  Normal  Speech/Language:   Normal Rate  Affect:  anxious, frustrated   Mood:  anxious  Thought process:  goal directed  Thought content:    WNL  Sensory/Perceptual disturbances:    WNL  Orientation:  oriented to person, place, time/date, situation, day of week, month of year and year  Attention:  Good  Concentration:  Good/Fair  Memory:  WNL  Fund of knowledge:   Good  Insight:    Good  Judgment:   Good  Impulse Control:  Good   Risk Assessment: Danger to Self:  No Self-injurious Behavior: No Danger to Others: No Duty to Warn:no Physical Aggression / Violence:No  Access to Firearms a concern: No  Gang Involvement:No   Subjective: Patient in today with some improvement in anxiety but very frustrated.  Some "teeny bursts of energy but don't seem to accomplish a lot."  "I want to be hopeful that things are improving but so much happens that we can't control that can be hurtful, like lots of people crowding at spring break and making numbers go up for Covid."    Interventions: Cognitive Behavioral Therapy and Ego-Supportive  Diagnosis:   ICD-10-CM   1. Anxiety disorder, unspecified type  F41.9      Plan: Patient not signing tx plan on computer screen due to [place].  Treatment Goals: Goals will remain on tx plan as patient works on strategies to achieve her goals. Progress or lack of, will be documented each week in Progress section of Plan.  Long-Term goal:(measurable) Enhance the ability to handle effectively the full variety of life's anxieties.Patient will eventually rate herself as a "3  or under on a 1-10 scale for depression and for anxiety, for a period of at least 2 months."  Strategies: Patient will work on re-framing thoughtsthat have been leading her to feel more anxious. The re-framed thoughts will support more calmness and personal empowerment for patient.  Short Term goal: Increase understanding of beliefs and messages that produce worry and anxiety.  Strategies: Patient will work with therapist to gain more understanding of how anxiety and worry are supported by certain beliefs and messages. She will be able to give at least 3 examples of such messages in an upcoming session.  Progress: Patient shares that her significantly decreased production and husband's lack of helpfulness really frustrates here. If she says anything to him, "he snaps at me." Sometimes "better to just let it go rather than say anything" but then nothing gets addressed or changed. Became unusually teary in session and stated that "things have just been piling up during the pandemic time"  Including husband's lack of helpfulness, so much to do around the house and not being productive, both pets have critical health concerns, work issue that has arisen about her "being on leave", and family concers with aging issues.  Says her lack of productiveness is really frustrating her and discouraging her, while also making her more anxious. Looked at some ways of trying to better manage all the things that she feels she needs to do, and also how to give herself a  break and understand that she needs to set limits and have appropriate expectation for herself versus the excessive expectations that she has had.  Really urged more positive self-talk and gave examples in session.  Does not want to consider any change of meds during this time as she feels with suggestions offered today she can manage things better. Denies any SI. Goal review and progress noted with patient.  Next appt within 2-3  weeks.   Shanon Ace, LCSW

## 2020-01-19 ENCOUNTER — Ambulatory Visit: Payer: Medicare Other | Admitting: Psychiatry

## 2020-01-21 ENCOUNTER — Other Ambulatory Visit: Payer: Self-pay

## 2020-01-21 ENCOUNTER — Ambulatory Visit (INDEPENDENT_AMBULATORY_CARE_PROVIDER_SITE_OTHER): Payer: Medicare Other | Admitting: Psychiatry

## 2020-01-21 DIAGNOSIS — F419 Anxiety disorder, unspecified: Secondary | ICD-10-CM

## 2020-01-21 NOTE — Progress Notes (Signed)
[facility] Counselor/Therapist Progress Note  Patient ID: Andrea Burke, MRN: [id number],    Date: 01/21/2020  Time Spent: 60 minutes  2:00pm to 3:00pm  Treatment Type: Individual Therapy  Reported Symptoms: anxiety, fearful thinking,   Mental Status Exam:  Appearance:   Well Groomed     Behavior:  Appropriate and Sharing  Motor:  Normal  Speech/Language:   Normal Rate  Affect:  anxious, depressed  Mood:  anxious and depressed  Thought process:  normal  Thought content:    WNL  Sensory/Perceptual disturbances:    WNL  Orientation:  oriented to person, place, time/date, situation, day of week, month of year and year  Attention:  Good  Concentration:  Good  Memory:  some forgetfulness and dr is aware  Fund of knowledge:   Good  Insight:    Good  Judgment:   Good  Impulse Control:  Good   Risk Assessment: Danger to Self:  No Self-injurious Behavior: No Danger to Others: No Duty to Warn:no Physical Aggression / Violence:No  Access to Firearms a concern: No  Gang Involvement:No   Subjective:   Patient today reporting anxiety and depression. States "there have been a couple issues including needing to have her cat euthanized but couldn't find him in neighborhood and we assume he may have died somewhere and we just haven't found him. Also a friends' daughter lost her baby and that has been sad." States those sadnesses have preoccupied her and she wasn't feeling quite as anxious for few days.   Interventions: Cognitive Behavioral Therapy and Ego-Supportive  Diagnosis:   ICD-10-CM   1. Anxiety disorder, unspecified type  F41.9     Plan: Patient not signing tx plan on computer screen due to [place].  Treatment Goals: Goals will remain on tx plan as patient works on strategies to achieve her goals. Progress or lack of, will be documented each week in Progress section of Plan.  Long-Term goal:(measurable) Enhance the ability to handle effectively the  full variety of life's anxieties.Patient will eventually rate herself as a "3 or under on a 1-10 scale for depression and for anxiety, for a period of at least 2 months."  Strategies: Patient will work on re-framing thoughtsthat have been leading her to feel more anxious. The re-framed thoughts will support more calmness and personal empowerment for patient.  Short Term goal: Increase understanding of beliefs and messages that produce worry and anxiety.  Strategies: Patient will work with therapist to gain more understanding of how anxiety and worry are supported by certain beliefs and messages. She will be able to give at least 3 examples of such messages in an upcoming session.  Progress: Patient in today reporting anxiety and depression. Is sad as they can't find her cat anywhere and he was sickly so patient is concerned he may have strayed away and dies somewhere. Also sad re: friend's daughter lost her baby. And continues to experience more generalized anxiety daily.  Worked today with new CBT poster I have and she worked on recognizing some of her most frequent anxious thoughts and replace them with more positive, reality-based, empowering thoughts that do not support anxiety nor depression.  Following up from last appt she reports her productiveness at home has been a little better so that was encouraging for her, and has hope to increase it even more within the next 2 weeks. Encouraged her use of the positive self-talk which we've practiced in prior session. Is better understanding some of  the beliefs and messages that contribute to her worrying and anxiety.  Goal review with patient and progress noted.  Next appt within 2 weeks.   Shanon Ace, LCSW

## 2020-02-03 ENCOUNTER — Other Ambulatory Visit: Payer: Self-pay

## 2020-02-03 ENCOUNTER — Ambulatory Visit (INDEPENDENT_AMBULATORY_CARE_PROVIDER_SITE_OTHER): Payer: Medicare Other | Admitting: Psychiatry

## 2020-02-03 DIAGNOSIS — F419 Anxiety disorder, unspecified: Secondary | ICD-10-CM | POA: Diagnosis not present

## 2020-02-03 NOTE — Progress Notes (Signed)
      [facility] Counselor/Therapist Progress Note  Patient ID: Andrea Burke, MRN: [id number],    Date: 02/03/2020  Time Spent: 60 minutes 11:00am to 12:00noon  Treatment Type: Individual Therapy  Reported Symptoms: anxiety, depression (improving)  Mental Status Exam:  Appearance:   Well Groomed     Behavior:  Appropriate, Sharing and Motivated  Motor:  Normal  Speech/Language:   Normal Rate  Affect:  anxious, some depression  Mood:  anxious and depressed  Thought process:  goal directed  Thought content:    WNL  Sensory/Perceptual disturbances:    WNL  Orientation:  oriented to person, place, time/date, situation, day of week, month of year and year  Attention:  Fair/Good  Concentration:  Fair/Good  Memory:  "some forgetfulness"  Fund of knowledge:   Good  Insight:    Good/Fair  Judgment:   Good  Impulse Control:  Good   Risk Assessment: Danger to Self:  No Self-injurious Behavior: No Danger to Others: No Duty to Warn:no Physical Aggression / Violence:No  Access to Firearms a concern: No  Gang Involvement:No   Subjective: Patient today reporting anxiety and depression, with anxiety being the stronger symptom.    Interventions: Cognitive Behavioral Therapy and Ego-Supportive  Diagnosis:   ICD-10-CM   1. Anxiety disorder, unspecified type  F41.9     Plan: Patient not signing tx plan on computer screen due to [place].  Treatment Goals: Goals will remain on tx plan as patient works on strategies to achieve her goals. Progress or lack of, will be documented each week in Progress section of Plan.  Long-Term goal:(measurable) Enhance the ability to handle effectively the full variety of life's anxieties.Patient will eventually rate herself as a "3 or under on a 1-10 scale for depression and for anxiety, for a period of at least 2 months."  Strategies: Patient will work on re-framing thoughtsthat have been leading her to feel more anxious. The  re-framed thoughts will support more calmness and personal empowerment for patient.  Short Term goal: Increase understanding of beliefs and messages that produce worry and anxiety.  Strategies: Patient will work with therapist to gain more understanding of how anxiety and worry are supported by certain beliefs and messages. She will be able to give at least 3 examples of such messages in an upcoming session.  Progress: Patient in today with anxiety and depression, and depression has decreased some. Still "feel like I'm not accomplishing enough every day." Shared her concerns about this and worked on motivation specifically about task completion and encouraging herself with positive self-talk.  Not getting things done, she reports, is a big source of some of her anxiety.  Worked today on some of her thoughts and beliefs that tend to lead her to feel more anxious and sometimes feel more depressed and how to re-frame them so she doesn't feel as "stuck".  Worked also on some positive self-talk and affirmations.  Homework assigned re: her list of tasks she wants to accomplish and she is to work on this and bring in next session with feedback on how the past couple weeks have been. Goal review and progress/challenges noted with patient.  Next appt within 2 weeks.   Shanon Ace, LCSW

## 2020-02-22 ENCOUNTER — Other Ambulatory Visit: Payer: Self-pay

## 2020-02-22 ENCOUNTER — Ambulatory Visit (INDEPENDENT_AMBULATORY_CARE_PROVIDER_SITE_OTHER): Payer: Medicare Other | Admitting: Psychiatry

## 2020-02-22 DIAGNOSIS — F419 Anxiety disorder, unspecified: Secondary | ICD-10-CM | POA: Diagnosis not present

## 2020-02-22 NOTE — Progress Notes (Signed)
      [facility] Counselor/Therapist Progress Note  Patient ID: Andrea Burke, MRN: [id number],    Date: 02/22/2020  Time Spent: 60 minutes   11:00am to 12:00noon  Treatment Type: Individual Therapy  Reported Symptoms: anxiety, depression  Mental Status Exam:  Appearance:   Well Groomed     Behavior:  Appropriate, Sharing and some little improvement in motivation  Motor:  Normal  Speech/Language:   Normal Rate  Affect:  anxious, some depression  Mood:  anxious and depressed  Thought process:  normal  Thought content:    WNL  Sensory/Perceptual disturbances:    WNL  Orientation:  oriented to person, place, time/date, situation, day of week, month of year and year  Attention:  Good  Concentration:  Good  Memory:  some forgetfulness  Fund of knowledge:   Good  Insight:    Good  Judgment:   Good  Impulse Control:  Good   Risk Assessment: Danger to Self:  No Self-injurious Behavior: No Danger to Others: No Duty to Warn:no Physical Aggression / Violence:No  Access to Firearms a concern: No  Gang Involvement:No   Subjective:  Patient today in with anxiety, and some depression. Some progress noted and has used some strategies with benefit.    Interventions: Cognitive Behavioral Therapy and Solution-Oriented/Positive Psychology  Diagnosis:   ICD-10-CM   1. Anxiety disorder, unspecified type  F41.9     Plan:  Patient in today reporting anxiety is her strongest symptom, but also having some lessened depression.  Her anxiety does seem to be some less also. Does feel the meds have caused some weight gain but then says it could be related to "my being home a lot more and more frequent snacking during Covid."  Needing to soon make a decision on whether to return to her job at [employer], and not sure she feels safe in doing that, not only because of Covid, but also due to some "Asian issues and concern about security."  Processed her feelings on this issue and supported her  making whatever choice she feels is best for her. On a 1-10 Depression Scale, she rates herself currently a "5".  On 1-10 Anxiety Scale, she rates herself a "7".  Finds that going outside, looking for positives versus negatives, and using strategy we've worked on before to challenge her anxious/depressive thoughts as to their validity and then replace them with more encouraging, reality based,and empowering thoughts that do not support anxiety nor depression.  This last strategy a bit more difficult doing on her own, and we used an example of her anxious thought to review the challenging and replacement of it today in session.    Goal review and progress noted with patient.  Next appt within 3 weeks.   Shanon Ace, LCSW

## 2020-02-23 ENCOUNTER — Ambulatory Visit: Payer: Medicare Other | Admitting: Psychiatry

## 2020-02-24 ENCOUNTER — Encounter: Payer: Self-pay | Admitting: Psychiatry

## 2020-02-24 ENCOUNTER — Other Ambulatory Visit: Payer: Self-pay

## 2020-02-24 ENCOUNTER — Ambulatory Visit (INDEPENDENT_AMBULATORY_CARE_PROVIDER_SITE_OTHER): Payer: Medicare Other | Admitting: Psychiatry

## 2020-02-24 DIAGNOSIS — F320 Major depressive disorder, single episode, mild: Secondary | ICD-10-CM | POA: Diagnosis not present

## 2020-02-24 DIAGNOSIS — F419 Anxiety disorder, unspecified: Secondary | ICD-10-CM

## 2020-02-24 MED ORDER — VIIBRYD 40 MG PO TABS: ORAL_TABLET | ORAL | 0 refills | Status: DC

## 2020-02-24 NOTE — Progress Notes (Signed)
Andrea Burke:[id number] 1946-05-19 74 y.o.  Subjective:   Patient ID:  Andrea Burke is a 74 y.o. (DOB 1946/03/18) female.  Chief Complaint:  Chief Complaint  Patient presents with  . Anxiety  . Depression    HPI Andrea Burke presents to the office today for follow-up of depression, anxiety, and sleep disturbance. She reports "last year it was more depression, now it's more anxiety." Reports that she feels overwhelmed at times and is not sure what to do first. She reports frequent worry and anxious thoughts, such as "if I don't get this started..."  Reports some occ slight chest tightness when she feels overwhelmed. She reports that some days she is not hungry due to anxiety and other days she is eating throughout the day. She reports experiencing sad mood "a lot of the time." She reports that she is not happy with herself and "the whole world." She reports some irritability.  She reports that she has not has "not had any get up and go." She reports that energy and motivation have improved some "but not back to normal." She reports that she has been putting off and avoiding some tasks. She reports difficulty with concentration and has difficulty retaining what she has read. Difficulty sorting through papers and jargon. She reports that she has been staying up late and sleeping later. Some nights she sleeps through the night and other nights she will have repeated awakenings and occasionally is taking about an hour to get back to sleep. She reports that she has not been having bad dreams. She reports that she continues to have some bizarre dreams, such as dreaming about a job she had in the 61's and then was upset because she dreamed of someone that is now deceased. Has been enjoying being outside and time with her dog. Has been going to the gym 2-3 times a week and that she has to push herself to go. Denies SI.   Her dog has had multiple health issues and is seeing different  specialists.   She reports that she is still out of work and reports that she has been less active since she stopped work. Worked to 12-20 hours a week.  Contemplating volunteer work.   Past medication trials: Remeron-vivid dreams, increased appetite Lexapro-nightmares Sertraline-agitation, insomnia, impaired concentration Paxil-intolerable side effects Fluoxetine-intolerable side effects Viibryd- Partial improvement on 20 mg. Does not want to take doses above 20 mg. Wellbutrin- Severe tremors Lithium Hydroxyzine Xanax Deplin  Review of Systems:  Review of Systems  Musculoskeletal: Positive for arthralgias. Negative for gait problem.  Neurological: Negative for tremors.  Psychiatric/Behavioral:       Please refer to HPI  She reports that she has not seen many of her health care providers since the start of the pandemic.   Medications: I have reviewed the patient's current medications.  Current Outpatient Medications  Medication Sig Dispense Refill  . BUTALBITAL-ACETAMINOPHEN PO Take by mouth.    . Calcium Carbonate-Vitamin D (CALTRATE 600+D PO) Take by mouth.    . finasteride (PROSCAR) 5 MG tablet Take 2.5 mg by mouth daily.    . fish oil-omega-3 fatty acids 1000 MG capsule Take 2 g by mouth daily.    . hydroxychloroquine (PLAQUENIL) 200 MG tablet Take by mouth daily.    . Multiple Vitamins-Minerals (CENTRUM PO) Take by mouth.    . Vilazodone HCl (VIIBRYD) 40 MG TABS Take 1/4 tab po qd 90 tablet 0  . ALPRAZolam (XANAX) 0.25 MG tablet Take  0.25 mg by mouth daily as needed.      No current facility-administered medications for this visit.    Medication Side Effects: Other: Possible wt gain  Allergies:  Allergies  Allergen Reactions  . Doxycycline Calcium   . Penicillins     Past Medical History:  Diagnosis Date  . Cancer (HCC)    KIDNEY  . Discoid lupus   . Endometriosis   . Headache(784.0)   . Insomnia   . Kidney disease   . Lupus ([place])   . Osteopenia    . Urinary incontinence     Family History  Problem Relation Age of Onset  . Hypertension Mother   . Hyperlipidemia Mother   . Tremor Mother   . Breast cancer Sister   . Hyperlipidemia Father   . Hypertension Father   . Dementia Father   . Tremor Father   . Cancer Sister   . Cancer Other     Social History   Socioeconomic History  . Marital status: Married    Spouse name: Not on file  . Number of children: Not on file  . Years of education: Not on file  . Highest education level: Not on file  Occupational History  . Not on file  Tobacco Use  . Smoking status: Former Research scientist (life sciences)  . Smokeless tobacco: Never Used  Substance and Sexual Activity  . Alcohol use: Yes    Comment: rare  . Drug use: Not on file  . Sexual activity: Never    Birth control/protection: Post-menopausal  Other Topics Concern  . Not on file  Social History Narrative  . Not on file   Social Determinants of Health   Financial Resource Strain:   . Difficulty of Paying Living Expenses:   Food Insecurity:   . Worried About Charity fundraiser in the Last Year:   . Arboriculturist in the Last Year:   Transportation Needs:   . Film/video editor (Medical):   [name] Kitchen Lack of Transportation (Non-Medical):   Physical Activity:   . Days of Exercise per Week:   . Minutes of Exercise per Session:   Stress:   . Feeling of Stress :   Social Connections:   . Frequency of Communication with Friends and Family:   . Frequency of Social Gatherings with Friends and Family:   . Attends Religious Services:   . Active Member of Clubs or Organizations:   . Attends Archivist Meetings:   [name] Kitchen Marital Status:   Intimate Partner Violence:   . Fear of Current or Ex-Partner:   . Emotionally Abused:   [name] Kitchen Physically Abused:   . Sexually Abused:     Past Medical History, Surgical history, Social history, and Family history were reviewed and updated as appropriate.   Please see review of systems for further  details on the patient's review from today.   Objective:   Physical Exam:  Wt 145 lb (65.8 kg)   BMI 28.32 kg/m   Physical Exam Constitutional:      General: She is not in acute distress. Musculoskeletal:        General: No deformity.  Neurological:     Mental Status: She is alert and oriented to person, place, and time.     Coordination: Coordination normal.  Psychiatric:        Attention and Perception: Attention and perception normal. She does not perceive auditory or visual hallucinations.        Mood and Affect: Mood  is anxious and depressed. Affect is not labile, blunt, angry or inappropriate.        Speech: Speech normal.        Behavior: Behavior normal.        Thought Content: Thought content normal. Thought content is not paranoid or delusional. Thought content does not include homicidal or suicidal ideation. Thought content does not include homicidal or suicidal plan.        Cognition and Memory: Cognition and memory normal.        Judgment: Judgment normal.     Comments: Insight intact     Lab Review:     Component Value Date/Time   NA 138 07/31/2014 1222   K 4.2 07/31/2014 1222   CL 107 07/31/2014 1222   CO2 24 07/31/2014 1222   GLUCOSE 88 07/31/2014 1222   BUN 11 07/31/2014 1222   CREATININE 0.72 07/31/2014 1222   CALCIUM 9.5 07/31/2014 1222   PROT 7.5 07/31/2014 1222   ALBUMIN 4.3 07/31/2014 1222   AST 21 07/31/2014 1222   ALT 17 07/31/2014 1222   ALKPHOS 54 07/31/2014 1222   BILITOT 0.6 07/31/2014 1222       Component Value Date/Time   WBC 3.7 (L) 09/29/2018 0951   RBC 4.86 09/29/2018 0951   HGB 14.1 09/29/2018 0951   HCT 41.8 09/29/2018 0951   PLT 183 09/29/2018 0951   MCV 86.0 09/29/2018 0951   MCV 86.6 07/31/2014 1222   MCH 29.0 09/29/2018 0951   MCHC 33.7 09/29/2018 0951   RDW 12.2 09/29/2018 0951   LYMPHSABS 1,887 09/29/2018 0951   EOSABS 122 09/29/2018 0951   BASOSABS 30 09/29/2018 0951    No results found for: POCLITH, LITHIUM    No results found for: PHENYTOIN, PHENOBARB, VALPROATE, CBMZ   .res Assessment: Plan:   Discussed treatment options and patient indicates that she would prefer to decrease Viibryd if possible.  Discussed decreasing Viibryd to 10 mg daily.  Patient has been taking half of a 40 mg tablet and reports that she currently has a significant supply of Viibryd 40 mg tablets at home and prefers to try to 1/4 tablets.  Discussed that she may wish to purchase a pill splitter to help with dividing tablets.  Advised patient to contact office if she has difficulty ordering tablets, and then prescription could be sent in for lower dose.  Also discussed calling office if she experiences any worsening mood or anxiety signs and symptoms. Recommend continuing psychotherapy with Rinaldo Cloud, LCSW. Patient to follow-up with this provider in 2 months or sooner if clinically indicated. Patient advised to contact office with any questions, adverse effects, or acute worsening in signs and symptoms.  Jacqualynn was seen today for anxiety and depression.  Diagnoses and all orders for this visit:  Major depressive disorder, single episode, mild (HCC) -     Vilazodone HCl (VIIBRYD) 40 MG TABS; Take 1/4 tab po qd  Anxiety disorder, unspecified type -     Vilazodone HCl (VIIBRYD) 40 MG TABS; Take 1/4 tab po qd     Please see After Visit Summary for patient specific instructions.  Future Appointments  Date Time Provider [place]  03/14/2020 12:00 PM Shanon Ace, LCSW [facility] None  04/06/2020  1:00 PM Shanon Ace, LCSW [facility] None  04/25/2020  1:00 PM Thayer Headings, PMHNP [facility] None  04/26/2020  3:00 PM Shanon Ace, LCSW [facility] None    No orders of the defined types were placed in this encounter.   -------------------------------

## 2020-03-14 ENCOUNTER — Ambulatory Visit (INDEPENDENT_AMBULATORY_CARE_PROVIDER_SITE_OTHER): Payer: Medicare Other | Admitting: Psychiatry

## 2020-03-14 ENCOUNTER — Other Ambulatory Visit: Payer: Self-pay

## 2020-03-14 DIAGNOSIS — F419 Anxiety disorder, unspecified: Secondary | ICD-10-CM

## 2020-03-14 NOTE — Progress Notes (Signed)
[facility] Counselor/Therapist Progress Note  Patient ID: Andrea Burke, MRN: [id number],    Date: 03/14/2020  Time Spent: 60 minutes  12:00noon to 1:00pm  Treatment Type: Individual Therapy  Reported Symptoms: anxiety, "and a little bit of depression" and shares it's decreased more the past month  Mental Status Exam:  Appearance:   Casual     Behavior:  Appropriate, Sharing and some motivation  Motor:  Normal  Speech/Language:   Normal Rate  Affect:  anxious, some depression  Mood:  anxious and some depression  Thought process:  goal directed  Thought content:    WNL  Sensory/Perceptual disturbances:    WNL  Orientation:  oriented to person, place, time/date, situation, day of week, month of year and year  Attention:  Good  Concentration:  Fair  Memory:  "forgetfulness short-term"  Fund of knowledge:   Good  Insight:    Good and Fair  Judgment:   Good and Fair  Impulse Control:  Good   Risk Assessment: Danger to Self:  No Self-injurious Behavior: No Danger to Others: No Duty to Warn:no Physical Aggression / Violence:No  Access to Firearms a concern: No  Gang Involvement:No   Subjective:  Patient today reports anxiety and depression (decreased). She reports "feeling some lighter, as not as gray and hazy, but things are some better."  Interventions: Cognitive Behavioral Therapy and Solution-Oriented/Positive Psychology  Diagnosis:   ICD-10-CM   1. Anxiety disorder, unspecified type  F41.9      Plan: Patient not signing tx plan on computer screen due to [place].  Treatment Goals: Goals will remain on tx plan as patient works on strategies to achieve her goals. Progress or lack of, will be documented each week in Progress section of Plan.  Long-Term goal:(measurable) Enhance the ability to handle effectively the full variety of life's anxieties.Patient will eventually rate herself as a "3 or under on a 1-10 scale for depression and for anxiety,  for a period of at least 2 months."  Strategies: Patient will work on re-framing thoughtsthat have been leading her to feel more anxious. The re-framed thoughts will support more calmness and personal empowerment for patient.  Short Term goal: Increase understanding of beliefs and messages that produce worry and anxiety.  Strategies: Patient will work with therapist to gain more understanding of how anxiety and worry are supported by certain beliefs and messages. She will be able to give at least 3 examples of such messages in an upcoming session.  Progress: Patient today reporting anxiety, and also some depression but it has decreased. On 1-10 scale for depression, she rates herself as a "4-5" currently.  On a 1-10 scale for anxiety, she rates herself a "6-7" today. Is thinking sh may not return to her job at [employer] and will make decision by June 30th.  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 She is aware that certain thoughts/beliefs do lead to worry and anxiety for her (per short term goal above) and to continue to recognize the thoughts more quickly in order to interrupt the thoughts/beliefs and change them to more positive, reality-based thoughts/beliefs that do not lead to anxiety/worry. Patient to work on this along with "finishing projects I start", being outside more, staying in the present, and looking for more positives rather than negatives.     Goal review and progress noted with patient.  Next appt within  3 weeks.   Shanon Ace, LCSW

## 2020-03-21 DIAGNOSIS — N898 Other specified noninflammatory disorders of vagina: Secondary | ICD-10-CM | POA: Diagnosis not present

## 2020-03-21 DIAGNOSIS — N952 Postmenopausal atrophic vaginitis: Secondary | ICD-10-CM | POA: Diagnosis not present

## 2020-04-06 ENCOUNTER — Ambulatory Visit (INDEPENDENT_AMBULATORY_CARE_PROVIDER_SITE_OTHER): Payer: Medicare Other | Admitting: Psychiatry

## 2020-04-06 ENCOUNTER — Other Ambulatory Visit: Payer: Self-pay

## 2020-04-06 DIAGNOSIS — F419 Anxiety disorder, unspecified: Secondary | ICD-10-CM | POA: Diagnosis not present

## 2020-04-06 NOTE — Progress Notes (Signed)
[facility] Counselor/Therapist Progress Note  Patient ID: Andrea Burke, MRN: [id number],    Date: 04/06/2020  Time Spent:  60 minutes   1:00pm to 2:00pm  Treatment Type: Individual Therapy  Reported Symptoms: anxiety (some improvement), frustrations   Mental Status Exam:  Appearance:   Bizarre and Well Groomed     Behavior:  Appropriate, Sharing and Motivated  Motor:  Normal  Speech/Language:   Normal Rate  Affect:  anxious (improving)  Mood:  anxious  Thought process:  normal  Thought content:    WNL  Sensory/Perceptual disturbances:    WNL  Orientation:  oriented to person, place, time/date, situation, day of week, month of year and year  Attention:  Good  Concentration:  Good and Fair  Memory:  WNL  Fund of knowledge:   Good  Insight:    Good and Fair  Judgment:   Good and Fair  Impulse Control:  Good   Risk Assessment: Danger to Self:  No Self-injurious Behavior: No Danger to Others: No Duty to Warn:no Physical Aggression / Violence:No  Access to Firearms a concern: No  Gang Involvement:No   Subjective:    Interventions: Solution-Oriented/Positive Psychology  Diagnosis:   ICD-10-CM   1. Anxiety disorder, unspecified type  F41.9     Plan: Patient not signing tx plan on computer screen due to [place].  Treatment Goals: Goals will remain on tx plan as patient works on strategies to achieve her goals. Progress or lack of, will be documented each week in Progress section of Plan.  Long-Term goal:(measurable) Enhance the ability to handle effectively the full variety of life's anxieties.Patient will eventually rate herself as a "3 or under on a 1-10 scale for depression and for anxiety, for a period of at least 2 months."  Strategies: Patient will work on re-framing thoughtsthat have been leading her to feel more anxious. The re-framed thoughts will support more calmness and personal empowerment for patient.  Short Term  goal: Increase understanding of beliefs and messages that produce worry and anxiety.  Strategies: Patient will work with therapist to gain more understanding of how anxiety and worry are supported by certain beliefs and messages. She will be able to give at least 3 examples of such messages in an upcoming session.  Progress: Patient in today reporting anxiety has decreased some by "at least 50 %. She feels that some of her medication and  anxiety strategies have been helping and we talked through this some in session today.  More positive and hopeful in some ways, and reports she is still working on this and is feeling some stronger within herself.  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  On 1-10 depression scale, today she rates self as a "4" and on 1-10 anxiety scale she rates self as a "4-5".  More self-aware of her thoughts and behaviors and how some thoughts feed her anxiety and fears.  She does feel that her anxious thoughts are decreasing some which is helping her start to feel more grounded and secure.  She is to continue her goal-directed work of interrupting anxious thoughts, challenge them, and replace them with more positive, reality-based, and empowering thoughts and self-talk that does not support anxiety.    Goal review and progress/efforts noted with patient.  Next appt within 3 weeks.   Andrea Ace, LCSW

## 2020-04-18 ENCOUNTER — Ambulatory Visit: Payer: Medicare Other | Admitting: Psychiatry

## 2020-04-20 DIAGNOSIS — R32 Unspecified urinary incontinence: Secondary | ICD-10-CM | POA: Diagnosis not present

## 2020-04-20 DIAGNOSIS — N9089 Other specified noninflammatory disorders of vulva and perineum: Secondary | ICD-10-CM | POA: Diagnosis not present

## 2020-04-20 DIAGNOSIS — M8580 Other specified disorders of bone density and structure, unspecified site: Secondary | ICD-10-CM | POA: Diagnosis not present

## 2020-04-20 DIAGNOSIS — K649 Unspecified hemorrhoids: Secondary | ICD-10-CM | POA: Diagnosis not present

## 2020-04-20 DIAGNOSIS — Z01419 Encounter for gynecological examination (general) (routine) without abnormal findings: Secondary | ICD-10-CM | POA: Diagnosis not present

## 2020-04-22 ENCOUNTER — Other Ambulatory Visit: Payer: Self-pay | Admitting: Nurse Practitioner

## 2020-04-22 DIAGNOSIS — M8580 Other specified disorders of bone density and structure, unspecified site: Secondary | ICD-10-CM

## 2020-04-25 ENCOUNTER — Encounter: Payer: Self-pay | Admitting: Psychiatry

## 2020-04-25 ENCOUNTER — Ambulatory Visit (INDEPENDENT_AMBULATORY_CARE_PROVIDER_SITE_OTHER): Payer: Medicare Other | Admitting: Psychiatry

## 2020-04-25 ENCOUNTER — Other Ambulatory Visit: Payer: Self-pay

## 2020-04-25 DIAGNOSIS — F320 Major depressive disorder, single episode, mild: Secondary | ICD-10-CM

## 2020-04-25 DIAGNOSIS — F5105 Insomnia due to other mental disorder: Secondary | ICD-10-CM

## 2020-04-25 DIAGNOSIS — F99 Mental disorder, not otherwise specified: Secondary | ICD-10-CM | POA: Diagnosis not present

## 2020-04-25 DIAGNOSIS — F419 Anxiety disorder, unspecified: Secondary | ICD-10-CM

## 2020-04-25 NOTE — Progress Notes (Signed)
Andrea Burke [id number] 01/27/1946 74 y.o.  Subjective:   Patient ID:  Andrea Burke is a 74 y.o. (DOB 04/28/1946) female.  Chief Complaint:  Chief Complaint  Patient presents with  . Follow-up    Depression and anxiety    HPI DARIELLE Burke presents to the office today for follow-up of depression, anxiety, and sleep disturbance. She reports, "we are coming out of our shells." She reports that they have been trying to do some work on their home. They have been cleaning out rooms. She reports some slight improvement in energy and motivation. Has been doing a small amount of socializing. Has had some slight anxiety, such as when someone is in her space or coughing. She reports that her anxiety has been manageable- "still there, not overwhelming me." Some worry. She reports some sad mood. She reports that sleep has been ok. She reports altered sleep wake cycle and is sometimes staying up later and then sleeps later, which she does not like. Continues to have some vivid dreams and nightmares. She reports that husband recently told her she was screaming and kicking. She sometimes does not recall content of nightmares. She has noticed some difficulty remembering things, such as someone's name. She has not been able to recall the name of her next door neighborhood. Has difficulty doing math in her head and now has to write it down. She reports that her husband tells her that she is repeating things.She reports adequate concentration and focus. Has difficulty completing things she has read and is not sure if this is due to diminished interest or distractibility. Has not been reading books and used to enjoy this. Appetite has been "up and down" and some days is indifferent about eating and other days will plan a meal and then not want to follow through. Will sometimes snack more. Denies SI.   Has been taking 1/4 of Viibryd 40 mg tabs.  Has 4 grandchildren and have been to some of their  gatherings and birthday celebrations.   Past medication trials: Remeron-vivid dreams, increased appetite Lexapro-nightmares Sertraline-agitation, insomnia, impaired concentration Paxil-intolerable side effects Fluoxetine-intolerable side effects Viibryd- Partial improvement on 20 mg. Does not want to take doses above 20 mg. Wellbutrin- Severe tremors Lithium Hydroxyzine Xanax Deplin  Mini-Mental     Office Visit from 04/25/2020 in [facility]  Total Score (max 30 points ) 29     Review of Systems:  Review of Systems  Musculoskeletal: Positive for arthralgias and myalgias. Negative for gait problem.  Neurological: Negative for tremors.  Psychiatric/Behavioral:       Please refer to HPI    Medications: I have reviewed the patient's current medications.  Current Outpatient Medications  Medication Sig Dispense Refill  . BUTALBITAL-ACETAMINOPHEN PO Take by mouth.    . Calcium Carbonate-Vitamin D (CALTRATE 600+D PO) Take by mouth.    . finasteride (PROSCAR) 5 MG tablet Take 2.5 mg by mouth daily.    . fish oil-omega-3 fatty acids 1000 MG capsule Take 2 g by mouth daily.    . hydroxychloroquine (PLAQUENIL) 200 MG tablet Take by mouth daily.    . Multiple Vitamins-Minerals (CENTRUM PO) Take by mouth.    . Vilazodone HCl (VIIBRYD) 40 MG TABS Take 1/4 tab po qd 90 tablet 0   No current facility-administered medications for this visit.    Medication Side Effects: None  Allergies:  Allergies  Allergen Reactions  . Doxycycline Calcium   . Penicillins     Past Medical  History:  Diagnosis Date  . Cancer (HCC)    KIDNEY  . Discoid lupus   . Endometriosis   . Headache(784.0)   . Insomnia   . Kidney disease   . Lupus ([place])   . Osteopenia   . Urinary incontinence     Family History  Problem Relation Age of Onset  . Hypertension Mother   . Hyperlipidemia Mother   . Tremor Mother   . Breast cancer Sister   . Hyperlipidemia Father   . Hypertension  Father   . Dementia Father   . Tremor Father   . Cancer Sister   . Cancer Other     Social History   Socioeconomic History  . Marital status: Married    Spouse name: Not on file  . Number of children: Not on file  . Years of education: Not on file  . Highest education level: Not on file  Occupational History  . Not on file  Tobacco Use  . Smoking status: Former Research scientist (life sciences)  . Smokeless tobacco: Never Used  Vaping Use  . Vaping Use: Never used  Substance and Sexual Activity  . Alcohol use: Yes    Comment: rare  . Drug use: Not on file  . Sexual activity: Never    Birth control/protection: Post-menopausal  Other Topics Concern  . Not on file  Social History Narrative  . Not on file   Social Determinants of Health   Financial Resource Strain:   . Difficulty of Paying Living Expenses:   Food Insecurity:   . Worried About Charity fundraiser in the Last Year:   . Arboriculturist in the Last Year:   Transportation Needs:   . Film/video editor (Medical):   [name] Kitchen Lack of Transportation (Non-Medical):   Physical Activity:   . Days of Exercise per Week:   . Minutes of Exercise per Session:   Stress:   . Feeling of Stress :   Social Connections:   . Frequency of Communication with Friends and Family:   . Frequency of Social Gatherings with Friends and Family:   . Attends Religious Services:   . Active Member of Clubs or Organizations:   . Attends Archivist Meetings:   [name] Kitchen Marital Status:   Intimate Partner Violence:   . Fear of Current or Ex-Partner:   . Emotionally Abused:   [name] Kitchen Physically Abused:   . Sexually Abused:     Past Medical History, Surgical history, Social history, and Family history were reviewed and updated as appropriate.   Please see review of systems for further details on the patient's review from today.   Objective:   Physical Exam:  There were no vitals taken for this visit.  Physical Exam Constitutional:      General: She is not  in acute distress. Musculoskeletal:        General: No deformity.  Neurological:     Mental Status: She is alert and oriented to person, place, and time.     Coordination: Coordination normal.  Psychiatric:        Attention and Perception: Attention and perception normal. She does not perceive auditory or visual hallucinations.        Mood and Affect: Mood normal. Mood is not anxious or depressed. Affect is not labile, blunt, angry or inappropriate.        Speech: Speech normal.        Behavior: Behavior normal.        Thought Content:  Thought content normal. Thought content is not paranoid or delusional. Thought content does not include homicidal or suicidal ideation. Thought content does not include homicidal or suicidal plan.        Cognition and Memory: Cognition and memory normal.        Judgment: Judgment normal.     Comments: Insight intact     Lab Review:     Component Value Date/Time   NA 138 07/31/2014 1222   K 4.2 07/31/2014 1222   CL 107 07/31/2014 1222   CO2 24 07/31/2014 1222   GLUCOSE 88 07/31/2014 1222   BUN 11 07/31/2014 1222   CREATININE 0.72 07/31/2014 1222   CALCIUM 9.5 07/31/2014 1222   PROT 7.5 07/31/2014 1222   ALBUMIN 4.3 07/31/2014 1222   AST 21 07/31/2014 1222   ALT 17 07/31/2014 1222   ALKPHOS 54 07/31/2014 1222   BILITOT 0.6 07/31/2014 1222       Component Value Date/Time   WBC 3.7 (L) 09/29/2018 0951   RBC 4.86 09/29/2018 0951   HGB 14.1 09/29/2018 0951   HCT 41.8 09/29/2018 0951   PLT 183 09/29/2018 0951   MCV 86.0 09/29/2018 0951   MCV 86.6 07/31/2014 1222   MCH 29.0 09/29/2018 0951   MCHC 33.7 09/29/2018 0951   RDW 12.2 09/29/2018 0951   LYMPHSABS 1,887 09/29/2018 0951   EOSABS 122 09/29/2018 0951   BASOSABS 30 09/29/2018 0951    No results found for: POCLITH, LITHIUM   No results found for: PHENYTOIN, PHENOBARB, VALPROATE, CBMZ   .res Assessment: Plan:   Discussed option of stopping Viibryd or continuing Viibryd 10 mg daily.   Patient reports that she would like to continue Viibryd 10 mg daily at this time.  She inquires about how she would discontinue Viibryd when she decides to stop.  Discussed that she could stop Viibryd 10 mg daily or she could decrease Viibryd to 5 mg daily for 1 to 2 weeks, and then stop to minimize any risk of possible discontinuation signs and symptoms since she has had discontinuation signs and symptoms with coming off of other medications in the past.  Patient reports that she will contact office if she decides that she would like to stop Viibryd and will consider decrease in Viibryd to 5 mg qd when ready to stop. Recommend continuing psychotherapy with Rinaldo Cloud, LCSW. Patient follow-up with this provider in 6 months or sooner if clinically indicated. Patient advised to contact office with any questions, adverse effects, or acute worsening in signs and symptoms.  Latise was seen today for follow-up.  Diagnoses and all orders for this visit:  Major depressive disorder, single episode, mild (HCC)  Anxiety disorder, unspecified type  Insomnia due to other mental disorder     Please see After Visit Summary for patient specific instructions.  Future Appointments  Date Time Provider [place]  04/26/2020  3:00 PM Shanon Ace, LCSW [facility] None  05/17/2020  3:00 PM Shanon Ace, LCSW [facility] None  10/24/2020  1:00 PM Thayer Headings, PMHNP [facility] None    No orders of the defined types were placed in this encounter.   -------------------------------

## 2020-04-26 ENCOUNTER — Ambulatory Visit (INDEPENDENT_AMBULATORY_CARE_PROVIDER_SITE_OTHER): Payer: Medicare Other | Admitting: Psychiatry

## 2020-04-26 ENCOUNTER — Other Ambulatory Visit: Payer: Self-pay

## 2020-04-26 DIAGNOSIS — F419 Anxiety disorder, unspecified: Secondary | ICD-10-CM

## 2020-04-26 NOTE — Progress Notes (Signed)
      [facility] Counselor/Therapist Progress Note  Patient ID: Andrea Burke, MRN: [id number],    Date: 04/26/2020  Time Spent: 60 minutes   3:00pm to 4:00pm   Treatment Type: Individual Therapy  Reported Symptoms: anxiety, some depression, Patient states both have improved.  Mental Status Exam:  Appearance:   Well Groomed     Behavior:  Appropriate, Sharing and Motivated  Motor:  Normal  Speech/Language:   Normal Rate  Affect:  anxious, depression  Mood:  anxious and depressed  Thought process:  normal  Thought content:    WNL  Sensory/Perceptual disturbances:    WNL  Orientation:  oriented to person, place, time/date, situation, day of week, month of year and year  Attention:  Fair  Concentration:  Fair  Memory:  some forgetfulness and worse under stress  Fund of knowledge:   Good  Insight:    Good/Fair  Judgment:   Good  Impulse Control:  Good   Risk Assessment: Danger to Self:  No Self-injurious Behavior: No Danger to Others: No Duty to Warn:no Physical Aggression / Violence:No  Access to Firearms a concern: No  Gang Involvement:No   Subjective: Patient today reporting anxiety and some depression with noted improvement.   Interventions: Cognitive Behavioral Therapy and Solution-Oriented/Positive Psychology  Diagnosis:   ICD-10-CM   1. Anxiety disorder, unspecified type  F41.9     Plan: Patient not signing tx plan on computer screen due to [place].  Treatment Goals: Goals will remain on tx plan as patient works on strategies to achieve her goals. Progress or lack of, will be documented each week in Progress section of Plan.  Long-Term goal:(measurable) Enhance the ability to handle effectively the full variety of life's anxieties.Patient will eventually rate herself as a "3 or under on a 1-10 scale for depression and for anxiety, for a period of at least 2 months."  Strategies: Patient will work on re-framing thoughtsthat have been  leading her to feel more anxious. The re-framed thoughts will support more calmness and personal empowerment for patient.  Short Term goal: Increase understanding of beliefs and messages that produce worry and anxiety.  Strategies: Patient will work with therapist to gain more understanding of how anxiety and worry are supported by certain beliefs and messages. She will be able to give at least 3 examples of such messages in an upcoming session.  Progress: Patient in today report not feeling as discouraged. "I'm not really happy but not really discouraged either."  "It's not all gloom and doom, and not as fearful." Reports feeling some increased hope.  Is concerned about the Covid #s picking back up. States her depression is not consistent like it was previously but still have "episodes of mild depression."  Rates self as a "5" on 1-10 anxiety scale and 1-10 depression scale today. Review of treatment goals and patient focusing more today and between sessions on strategy above of working to re-frame anxious/depressive thoughts that typically lead to anxiety or depression. Also discussed her preparing and using a Coping Card which she plans to follow up.   Goal review and progress noted with patient.  Next appt 3-4 weeks out.   Shanon Ace, LCSW

## 2020-05-17 ENCOUNTER — Other Ambulatory Visit: Payer: Self-pay

## 2020-05-17 ENCOUNTER — Ambulatory Visit (INDEPENDENT_AMBULATORY_CARE_PROVIDER_SITE_OTHER): Payer: Medicare Other | Admitting: Psychiatry

## 2020-05-17 DIAGNOSIS — F419 Anxiety disorder, unspecified: Secondary | ICD-10-CM

## 2020-05-17 NOTE — Progress Notes (Signed)
[facility] Counselor/Therapist Progress Note  Patient ID: Andrea Burke, MRN: [id number],    Date: 05/17/2020  Time Spent: 60 minutes   3:00pm to 4:00pm  Treatment Type: Individual Therapy  Reported Symptoms: anxiety, "not really any depression right now"  Mental Status Exam:  Appearance:   Casual     Behavior:  Appropriate, Sharing and Motivated  Motor:  Normal  Speech/Language:   Clear and Coherent  Affect:  anxious  Mood:  anxious  Thought process:  goal directed  Thought content:    WNL  Sensory/Perceptual disturbances:    WNL  Orientation:  oriented to person, place, time/date, situation, day of week, month of year and year  Attention:  Good  Concentration:  Good and Fair  Memory:  "sometimes a little forgetfulness"  Fund of knowledge:   Good  Insight:    Good  Judgment:   Good  Impulse Control:  Good   Risk Assessment: Danger to Self:  No Self-injurious Behavior: No Danger to Others: No Duty to Warn:no Physical Aggression / Violence:No  Access to Firearms a concern: No  Gang Involvement:No   Subjective: Patient today reports anxiety, but not having as much  depression at this time.  Is worried about the Covid.   Interventions: Solution-Oriented/Positive Psychology and Ego-Supportive  Diagnosis:   ICD-10-CM   1. Anxiety disorder, unspecified type  F41.9      Plan: Patient not signing tx plan on computer screen due to [place].  Treatment Goals: Goals will remain on tx plan as patient works on strategies to achieve her goals. Progress or lack of, will be documented each week in Progress section of Plan.  Long-Term goal:(measurable) Enhance the ability to handle effectively the full variety of life's anxieties.Patient will eventually rate herself as a "3 or under on a 1-10 scale for depression and for anxiety, for a period of at least 2 months."  Strategies: Patient will work on re-framing thoughtsthat have been leading her to feel  more anxious. The re-framed thoughts will support more calmness and personal empowerment for patient.  Short Term goal: Increase understanding of beliefs and messages that produce worry and anxiety.  Strategies: Patient will work with therapist to gain more understanding of how anxiety and worry are supported by certain beliefs and messages. She will be able to give at least 3 examples of such messages in an upcoming session.  Progress: Patient in today with anxiety and some heightened fears re: surge in Covid. Also has some anxious thoughts about some health concerns and has appt in future for followup.  States today she is not feeling as discouraged nor as depressed.  On a 1-10 scale of anxiety she reports being a "5".   Occasional sleep issues but feels that it is some better.  On a 1-10 scale for depression, she reports being a "5", and describes her depression as being mild episodes. Reviewed treatment goals with patient as she is clearly showing some encouraging improvement today although as mentioned above, she has become more concerned about another surge in [place].  I encouraged her to continue working on her anxious thoughts between sessions and reframing those thoughts that tend to feed her anxiety, and convert them to more positive, realistic, and empowering thoughts.  Discussed again the option of using a coping card and patient actually seemed more interested in at this session.  Encouraged to stay in contact with family, spend time outdoors, and working on some renovations they are doing at  her home as those things help keep her mind occupied on things that do not feed anxiety.  She jokingly laughed and added "except it can feed anxiety but all the choices she has to make in colors," which it was good to see her healthy sense of humor to emerge some.   Goal review and progress noted with patient.  Next appt within 3-4 weeks.   Shanon Ace,  LCSW

## 2020-05-24 DIAGNOSIS — D72819 Decreased white blood cell count, unspecified: Secondary | ICD-10-CM | POA: Diagnosis not present

## 2020-05-24 DIAGNOSIS — M25512 Pain in left shoulder: Secondary | ICD-10-CM | POA: Diagnosis not present

## 2020-05-24 DIAGNOSIS — M25511 Pain in right shoulder: Secondary | ICD-10-CM | POA: Diagnosis not present

## 2020-05-24 DIAGNOSIS — G8929 Other chronic pain: Secondary | ICD-10-CM | POA: Diagnosis not present

## 2020-05-24 DIAGNOSIS — K640 First degree hemorrhoids: Secondary | ICD-10-CM | POA: Diagnosis not present

## 2020-06-01 DIAGNOSIS — M25512 Pain in left shoulder: Secondary | ICD-10-CM | POA: Diagnosis not present

## 2020-06-01 DIAGNOSIS — M25511 Pain in right shoulder: Secondary | ICD-10-CM | POA: Diagnosis not present

## 2020-06-01 DIAGNOSIS — N898 Other specified noninflammatory disorders of vagina: Secondary | ICD-10-CM | POA: Diagnosis not present

## 2020-06-01 DIAGNOSIS — N9089 Other specified noninflammatory disorders of vulva and perineum: Secondary | ICD-10-CM | POA: Diagnosis not present

## 2020-06-06 DIAGNOSIS — K5909 Other constipation: Secondary | ICD-10-CM | POA: Diagnosis not present

## 2020-06-06 DIAGNOSIS — M6281 Muscle weakness (generalized): Secondary | ICD-10-CM | POA: Diagnosis not present

## 2020-06-06 DIAGNOSIS — N393 Stress incontinence (female) (male): Secondary | ICD-10-CM | POA: Diagnosis not present

## 2020-06-06 DIAGNOSIS — N3941 Urge incontinence: Secondary | ICD-10-CM | POA: Diagnosis not present

## 2020-06-06 DIAGNOSIS — M545 Low back pain: Secondary | ICD-10-CM | POA: Diagnosis not present

## 2020-06-06 DIAGNOSIS — R3915 Urgency of urination: Secondary | ICD-10-CM | POA: Diagnosis not present

## 2020-06-07 ENCOUNTER — Ambulatory Visit (INDEPENDENT_AMBULATORY_CARE_PROVIDER_SITE_OTHER): Payer: Medicare Other | Admitting: Psychiatry

## 2020-06-07 ENCOUNTER — Other Ambulatory Visit: Payer: Self-pay

## 2020-06-07 DIAGNOSIS — F419 Anxiety disorder, unspecified: Secondary | ICD-10-CM | POA: Diagnosis not present

## 2020-06-07 NOTE — Progress Notes (Signed)
[facility] Counselor/Therapist Progress Note  Patient ID: Andrea Burke, MRN: [id number],    Date: 06/07/2020  Time Spent: 60 minutes  1:00pm to 2:00pm  Treatment Type: Individual Therapy  Reported Symptoms: anxiety re: Covid, world situations, lot of work being done on their home and it is stressful to make decisions  Mental Status Exam:  Appearance:   Casual     Behavior:  Appropriate and Sharing  Motor:  Normal  Speech/Language:   Clear and Coherent  Affect:  anxious  Mood:  anxious  Thought process:  normal  Thought content:    WNL  Sensory/Perceptual disturbances:    WNL  Orientation:  oriented to person, place, time/date, situation, day of week, month of year and year  Attention:  Good  Concentration:  Good and Fair  Memory:  some forgetfulness  Fund of knowledge:   Good  Insight:    Good and Fair  Judgment:   Good  Impulse Control:  Good   Risk Assessment: Danger to Self:  No Self-injurious Behavior: No Danger to Others: No Duty to Warn:no Physical Aggression / Violence:No  Access to Firearms a concern: No  Gang Involvement:No   Subjective: Patient today reports anxiety re: Covid surge, family, "the world" and crisis, and stressed with home repairs. Sees a lot of news on TV that is disturbing at times. "It doesn't overwhelm me quite as much as it used to. " Encouraged patient to limit her viewing of such info on TV or online.  Interventions: Cognitive Behavioral Therapy and Solution-Oriented/Positive Psychology  Diagnosis:   ICD-10-CM   1. Anxiety disorder, unspecified type  F41.9     Plan: Patient not signing tx plan on computer screen due to [place].  Treatment Goals: Goals will remain on tx plan as patient works on strategies to achieve her goals. Progress or lack of, will be documented each week in Progress section of Plan.  Long-Term goal:(measurable) Enhance the ability to handle effectively the full variety of life's  anxieties.Patient will eventually rate herself as a "3 or under on a 1-10 scale for depression and for anxiety, for a period of at least 2 months."  Strategies: Patient will work on re-framing thoughtsthat have been leading her to feel more anxious. The re-framed thoughts will support more calmness and personal empowerment for patient.  Short Term goal: Increase understanding of beliefs and messages that produce worry and anxiety.  Strategies: Patient will work with therapist to gain more understanding of how anxiety and worry are supported by certain beliefs and messages. She will be able to give at least 3 examples of such messages in an upcoming session.  Progress: Patient in today, with anxiety related to Covid surge, family, world crises, and stressed with home repairs/renovations.  Discussed each of these with patient as she is a more quiet person and doesn't talk to a lot of people about sensitive issues for her.  She was very articulate about each of the stressors, and did add that watching TV news (including some disturbing news and world crises) doesn't upset her as a as much as it used to.  She does tend to always respond well after talking through her anxieties.  Reviewed her treatment goals and per her long-term goal, she has not quite met it as her anxiety is not measuring up to a 3 or less on a 10 point scale for consecutive weeks at a time.  She is however on track to meet that goal if  she continues to move in a positive direction.  Today, her anxiety is rated as a "4-5", and reports no feelings of depression.  She continues to show improvement in her goal areas and I see more strength in her when I am meeting with her face-to-face and appointments.  The upheaval at home with all the repairs and renovations is weighing on her right now but she also tries to remind herself it is time limited and hopefully will be completed in the next few weeks.  Encouraging patient to  continue good self-care, emotional and physical, including being outside some each day, walking and doing other physical movement as she is able, setting limits as needed with other people, staying in contact with family and friends that are supportive, and working to make herself talk more positive.  Goal review and progress noted with patient.  Next appt within 4-5 weeks and will call if needed before then.   Shanon Ace, LCSW

## 2020-06-14 DIAGNOSIS — M6281 Muscle weakness (generalized): Secondary | ICD-10-CM | POA: Diagnosis not present

## 2020-06-14 DIAGNOSIS — N3941 Urge incontinence: Secondary | ICD-10-CM | POA: Diagnosis not present

## 2020-06-14 DIAGNOSIS — N393 Stress incontinence (female) (male): Secondary | ICD-10-CM | POA: Diagnosis not present

## 2020-06-14 DIAGNOSIS — K5909 Other constipation: Secondary | ICD-10-CM | POA: Diagnosis not present

## 2020-06-14 DIAGNOSIS — R3915 Urgency of urination: Secondary | ICD-10-CM | POA: Diagnosis not present

## 2020-06-14 DIAGNOSIS — M545 Low back pain: Secondary | ICD-10-CM | POA: Diagnosis not present

## 2020-06-17 DIAGNOSIS — Z1231 Encounter for screening mammogram for malignant neoplasm of breast: Secondary | ICD-10-CM | POA: Diagnosis not present

## 2020-06-29 ENCOUNTER — Ambulatory Visit (INDEPENDENT_AMBULATORY_CARE_PROVIDER_SITE_OTHER): Payer: Medicare Other | Admitting: Psychiatry

## 2020-06-29 ENCOUNTER — Other Ambulatory Visit: Payer: Self-pay

## 2020-06-29 DIAGNOSIS — N393 Stress incontinence (female) (male): Secondary | ICD-10-CM | POA: Diagnosis not present

## 2020-06-29 DIAGNOSIS — N3941 Urge incontinence: Secondary | ICD-10-CM | POA: Diagnosis not present

## 2020-06-29 DIAGNOSIS — K5909 Other constipation: Secondary | ICD-10-CM | POA: Diagnosis not present

## 2020-06-29 DIAGNOSIS — F419 Anxiety disorder, unspecified: Secondary | ICD-10-CM

## 2020-06-29 DIAGNOSIS — M545 Low back pain: Secondary | ICD-10-CM | POA: Diagnosis not present

## 2020-06-29 DIAGNOSIS — M6281 Muscle weakness (generalized): Secondary | ICD-10-CM | POA: Diagnosis not present

## 2020-06-29 DIAGNOSIS — R3915 Urgency of urination: Secondary | ICD-10-CM | POA: Diagnosis not present

## 2020-06-29 NOTE — Progress Notes (Signed)
[facility] Counselor/Therapist Progress Note  Patient ID: Andrea Burke, MRN: [id number],    Date: 06/29/2020  Time Spent: 60 minutes   12:00noon to 1:00pm  Treatment Type: Individual Therapy  Reported Symptoms: anxiety and stress, both heightened recently  Mental Status Exam:  Appearance:   Neat     Behavior:  Appropriate, Sharing and Motivated  Motor:  Normal  Speech/Language:   Clear and Coherent  Affect:  anxious, tearful  Mood:  anxious  Thought process:  goal directed  Thought content:    WNL  Sensory/Perceptual disturbances:    WNL  Orientation:  oriented to person, place, time/date, situation, day of week, month of year and year  Attention:  Good  Concentration:  Good and Fair  Memory:  ocassional forgetfulness under stress  Fund of knowledge:   Good  Insight:    Good and Fair  Judgment:   Good and Fair  Impulse Control:  Fair   Risk Assessment: Danger to Self:  No Self-injurious Behavior: No Danger to Others: No Duty to Warn:no Physical Aggression / Violence:No  Access to Firearms a concern: No  Gang Involvement:No     Subjective: Patient today reports heightened anxiety, tearfulness, stressed, fearful and self-negating due to circumstances described below in Progress section.  Interventions: Cognitive Behavioral Therapy and Solution-Oriented/Positive Psychology  Diagnosis:   ICD-10-CM   1. Anxiety disorder, unspecified type  F41.9      Plan: Patient not signing tx plan on computer screen due to [place].  Treatment Goals: Goals will remain on tx plan as patient works on strategies to achieve her goals. Progress or lack of, will be documented each week in Progress section of Plan.  Long-Term goal:(measurable) Enhance the ability to handle effectively the full variety of life's anxieties.Patient will eventually rate herself as a "3 or under on a 1-10 scale for depression and for anxiety, for a period of at least 2  months."  Strategies: Patient will work on re-framing thoughtsthat have been leading her to feel more anxious. The re-framed thoughts will support more calmness and personal empowerment for patient.  Short Term goal: Increase understanding of beliefs and messages that produce worry and anxiety.  Strategies: Patient will work with therapist to gain more understanding of how anxiety and worry are supported by certain beliefs and messages. She will be able to give at least 3 examples of such messages in an upcoming session.  Progress: Patient in today reporting increased anxiety, stressed, tearfulness, frustrated at herself. She had an incident this past week that really created a lot of stress, fear, anxiety for her.  Has tried not to talk about it much but today in session she openly expressed her tearfulness, heightened stress and anxiety, and her frustration with herself. This was all due to a phone incident when she was placing an order and was giving out information and suddenly thought it was a scam.  She had used the same procedure she had done on multiple occasions with the same business but when she began to think it was a scam, she became very very upset and has been upset several days "imagining the worst."  When she explained what all she went through it sounded like the situation might not be a scam and I encouraged her to call the business as there were a couple of things she has already identified that point to it not being a scam but she can get more accurate information talking with the people directly.  She was very emotional in session today but was noticeably more grounded by session end. Denies any SI.  This had apparently built up over several days, during which she barely ate anything, and she really needed the session to process this and talk through her feelings as well as having a plan to move forward, with her husband's help.  Encouraged good self-care including  eating healthy, getting better sleep, taking meds as prescribed, no negative self talk, being in contact with supportive people, and she and husband following through on information patient has to verify the situation that had upset her so badly these past few days.   Goal review and progress/challenges noted with patient.  Next appt within 3 weeks.   Shanon Ace, LCSW

## 2020-06-30 DIAGNOSIS — L649 Androgenic alopecia, unspecified: Secondary | ICD-10-CM | POA: Diagnosis not present

## 2020-06-30 DIAGNOSIS — L932 Other local lupus erythematosus: Secondary | ICD-10-CM | POA: Diagnosis not present

## 2020-06-30 DIAGNOSIS — Z5181 Encounter for therapeutic drug level monitoring: Secondary | ICD-10-CM | POA: Diagnosis not present

## 2020-07-06 DIAGNOSIS — N3941 Urge incontinence: Secondary | ICD-10-CM | POA: Diagnosis not present

## 2020-07-06 DIAGNOSIS — M545 Low back pain: Secondary | ICD-10-CM | POA: Diagnosis not present

## 2020-07-06 DIAGNOSIS — K5909 Other constipation: Secondary | ICD-10-CM | POA: Diagnosis not present

## 2020-07-06 DIAGNOSIS — M6281 Muscle weakness (generalized): Secondary | ICD-10-CM | POA: Diagnosis not present

## 2020-07-06 DIAGNOSIS — R3915 Urgency of urination: Secondary | ICD-10-CM | POA: Diagnosis not present

## 2020-07-06 DIAGNOSIS — N393 Stress incontinence (female) (male): Secondary | ICD-10-CM | POA: Diagnosis not present

## 2020-07-13 DIAGNOSIS — M545 Low back pain, unspecified: Secondary | ICD-10-CM | POA: Diagnosis not present

## 2020-07-13 DIAGNOSIS — R3915 Urgency of urination: Secondary | ICD-10-CM | POA: Diagnosis not present

## 2020-07-13 DIAGNOSIS — N393 Stress incontinence (female) (male): Secondary | ICD-10-CM | POA: Diagnosis not present

## 2020-07-13 DIAGNOSIS — N3941 Urge incontinence: Secondary | ICD-10-CM | POA: Diagnosis not present

## 2020-07-13 DIAGNOSIS — M6281 Muscle weakness (generalized): Secondary | ICD-10-CM | POA: Diagnosis not present

## 2020-07-13 DIAGNOSIS — K5909 Other constipation: Secondary | ICD-10-CM | POA: Diagnosis not present

## 2020-07-18 ENCOUNTER — Ambulatory Visit (INDEPENDENT_AMBULATORY_CARE_PROVIDER_SITE_OTHER): Payer: Medicare Other | Admitting: Psychiatry

## 2020-07-18 ENCOUNTER — Other Ambulatory Visit: Payer: Self-pay

## 2020-07-18 DIAGNOSIS — F419 Anxiety disorder, unspecified: Secondary | ICD-10-CM | POA: Diagnosis not present

## 2020-07-18 NOTE — Progress Notes (Signed)
[facility] Counselor/Therapist Progress Note  Patient ID: Andrea Burke, MRN: [id number],    Date: 07/18/2020  Time Spent: 60 minutes  12:00noon to 1:00pm  Treatment Type: Individual Therapy  Reported Symptoms: anxiety (strongest symptom per patient), depression, stressed  Mental Status Exam:  Appearance:   Well Groomed     Behavior:  Appropriate and Sharing  Motor:  Normal  Speech/Language:   Clear and Coherent  Affect:  anxious  Mood:  anxious and depressed  Thought process:  normal  Thought content:    WNL  Sensory/Perceptual disturbances:    WNL  Orientation:  oriented to person, place, time/date, situation, day of week, month of year and year  Attention:  Fair  Concentration:  Fair  Memory:  some forgetfulness and worse under stress  Fund of knowledge:   Good  Insight:    Fair  Judgment:   Fair  Impulse Control:  Good and Fair   Risk Assessment: Danger to Self:  No Self-injurious Behavior: No Danger to Others: No Duty to Warn:no Physical Aggression / Violence:No  Access to Firearms a concern: No  Gang Involvement:No   Subjective:  Patient today reports anxiety as her "main symptom", and also depression, and feeling stressed with home situation and with husband who is also stressed with home situtuation.  Interventions: Cognitive Behavioral Therapy and Solution-Oriented/Positive Psychology  Diagnosis:   ICD-10-CM   1. Anxiety disorder, unspecified type  F41.9     Plan: Patient not signing tx plan on computer screen due to [place].  Treatment Goals: Goals will remain on tx plan as patient works on strategies to achieve her goals. Progress or lack of, will be documented each week in Progress section of Plan.  Long-Term goal:(measurable) Enhance the ability to handle effectively the full variety of life's anxieties.Patient will eventually rate herself as a "3 or under on a 1-10 scale for depression and for anxiety, for a period of at  least 2 months."  Strategies: Patient will work on re-framing thoughtsthat have been leading her to feel more anxious. The re-framed thoughts will support more calmness and personal empowerment for patient.  Short Term goal: Increase understanding of beliefs and messages that produce worry and anxiety.  Strategies: Patient will work with therapist to gain more understanding of how anxiety and worry are supported by certain beliefs and messages. She will be able to give at least 3 examples of such messages in an upcoming session.  Progress: Patient today states anxiety is her main symptom, along with depression and feeling stressed. Following up from her prior visit, she reports that she did find out the troublesome contact from a company was Not a scam. Her birthday is coming up and she reports "feeling old".  Anxiety and depression mostly related to home situation and at times her marital relationship. Anxious/depressive thoughts have improved some since last appt especially about the "fear of scam" issue noted above.  Encouraged her work with short-term goal above in treatment plan to notice more the types of beliefs that lead her to worry more.  Also encouraged good self-care including staying on her medications as prescribed, no negative self talk, healthy nutrition, physical movement or walking, having contact with supportive people, allowing for better sleep, and communicate with husband about the 2 of them having a little more communication time without interruptions of TV or online devices.  Goal review and progress/challenges noted with patient.  Next appointment within 2 to 3 weeks.   Shanon Ace,  LCSW

## 2020-07-20 DIAGNOSIS — K5909 Other constipation: Secondary | ICD-10-CM | POA: Diagnosis not present

## 2020-07-20 DIAGNOSIS — R3915 Urgency of urination: Secondary | ICD-10-CM | POA: Diagnosis not present

## 2020-07-20 DIAGNOSIS — M6281 Muscle weakness (generalized): Secondary | ICD-10-CM | POA: Diagnosis not present

## 2020-07-20 DIAGNOSIS — N393 Stress incontinence (female) (male): Secondary | ICD-10-CM | POA: Diagnosis not present

## 2020-07-20 DIAGNOSIS — N3941 Urge incontinence: Secondary | ICD-10-CM | POA: Diagnosis not present

## 2020-07-21 DIAGNOSIS — H04123 Dry eye syndrome of bilateral lacrimal glands: Secondary | ICD-10-CM | POA: Diagnosis not present

## 2020-07-21 DIAGNOSIS — Z961 Presence of intraocular lens: Secondary | ICD-10-CM | POA: Diagnosis not present

## 2020-07-21 DIAGNOSIS — Z79899 Other long term (current) drug therapy: Secondary | ICD-10-CM | POA: Diagnosis not present

## 2020-07-21 DIAGNOSIS — H524 Presbyopia: Secondary | ICD-10-CM | POA: Diagnosis not present

## 2020-07-27 DIAGNOSIS — N3941 Urge incontinence: Secondary | ICD-10-CM | POA: Diagnosis not present

## 2020-07-27 DIAGNOSIS — R3915 Urgency of urination: Secondary | ICD-10-CM | POA: Diagnosis not present

## 2020-07-27 DIAGNOSIS — N393 Stress incontinence (female) (male): Secondary | ICD-10-CM | POA: Diagnosis not present

## 2020-07-27 DIAGNOSIS — K5909 Other constipation: Secondary | ICD-10-CM | POA: Diagnosis not present

## 2020-07-27 DIAGNOSIS — M6281 Muscle weakness (generalized): Secondary | ICD-10-CM | POA: Diagnosis not present

## 2020-08-01 ENCOUNTER — Ambulatory Visit: Payer: Medicare Other | Admitting: Psychiatry

## 2020-08-02 ENCOUNTER — Ambulatory Visit (INDEPENDENT_AMBULATORY_CARE_PROVIDER_SITE_OTHER): Payer: Medicare Other | Admitting: Psychiatry

## 2020-08-02 ENCOUNTER — Other Ambulatory Visit: Payer: Self-pay

## 2020-08-02 DIAGNOSIS — F419 Anxiety disorder, unspecified: Secondary | ICD-10-CM

## 2020-08-02 NOTE — Progress Notes (Signed)
[facility] Counselor/Therapist Progress Note  Patient ID: Andrea Burke, MRN: [id number],    Date: 08/02/2020  Time Spent: 60 minutes     4:00pm to 5:00pm  Treatment Type: Individual Therapy  Reported Symptoms: anxiety is better, sadness worse for past couple weeks (husband's health-had a bad fall last week but is recuperating; the holiday season coming up and uncertainties, mom has declined some), looks for the negatives versus the positives    Mental Status Exam:  Appearance:   Well Groomed     Behavior:  Appropriate, Sharing and Motivated  Motor:  Normal  Speech/Language:   Clear and Coherent  Affect:  anxious  Mood:  anxious  Thought process:  goal directed  Thought content:    WNL  Sensory/Perceptual disturbances:    WNL  Orientation:  oriented to person, place, time/date, situation, day of week, month of year and year  Attention:  Good  Concentration:  Fair  Memory:  some forgetfulness  Fund of knowledge:   Good  Insight:    Good and Fair  Judgment:   Good  Impulse Control:  Good   Risk Assessment: Danger to Self:  No Self-injurious Behavior: No Danger to Others: No Duty to Warn:no Physical Aggression / Violence:No  Access to Firearms a concern: No  Gang Involvement:No   Subjective: Patient today states anxiety is some better, but sadness has been worse due to husband's health (bad fall recently and taken to hospital, recuperating at home), her mom has declined some, and the holiday uncertainties are stressful for patient.    Interventions: Cognitive Behavioral Therapy and Solution-Oriented/Positive Psychology  Diagnosis:   ICD-10-CM   1. Anxiety disorder, unspecified type  F41.9     Plan: Patient not signing tx plan on computer screen due to [place].  Treatment Goals: Goals will remain on tx plan as patient works on strategies to achieve her goals. Progress or lack of, will be documented each week in Progress section of Plan.  Long-Term  goal:(measurable) Enhance the ability to handle effectively the full variety of life's anxieties.Patient will eventually rate herself as a "3 or under on a 1-10 scale for depression and for anxiety, for a period of at least 2 months."  Strategies: Patient will work on re-framing thoughtsthat have been leading her to feel more anxious. The re-framed thoughts will support more calmness and personal empowerment for patient.  Short Term goal: Increase understanding of beliefs and messages that produce worry and anxiety.  Strategies: Patient will work with therapist to gain more understanding of how anxiety and worry are supported by certain beliefs and messages. She will be able to give at least 3 examples of such messages in an upcoming session.  Progress: Patient in today reporting that her anxiety has lessened; her sadness has increased some more recently with husband's bad fall and mom's declining health, uncertainties and continuing Covid. Today talks through her issues supporting the sadness and eventually reached a point of being able to look at some options that may help her feel less sad, including being in touch with certain relatives in [place], attending a service at her church (going masked so as to feel safer), getting outside more, and walking some as able. Talking through her sadness today also helped her feel more grounded by session end. Emphasized "looking for the positives versus negatives each day."   Goal review and progress/challenges noted with patient.  Next appointment within 3 weeks.   Shanon Ace, LCSW

## 2020-08-05 ENCOUNTER — Other Ambulatory Visit: Payer: Self-pay

## 2020-08-05 ENCOUNTER — Ambulatory Visit
Admission: RE | Admit: 2020-08-05 | Discharge: 2020-08-05 | Disposition: A | Discharge status: Home / Self Care | Payer: Medicare Other | Source: Ambulatory Visit | Attending: Nurse Practitioner | Admitting: Nurse Practitioner

## 2020-08-05 DIAGNOSIS — Z780 Asymptomatic menopausal state: Secondary | ICD-10-CM | POA: Diagnosis not present

## 2020-08-05 DIAGNOSIS — M8589 Other specified disorders of bone density and structure, multiple sites: Secondary | ICD-10-CM | POA: Diagnosis not present

## 2020-08-05 DIAGNOSIS — M8580 Other specified disorders of bone density and structure, unspecified site: Secondary | ICD-10-CM

## 2020-08-16 ENCOUNTER — Ambulatory Visit: Payer: Medicare Other | Admitting: Psychiatry

## 2020-08-18 DIAGNOSIS — G43009 Migraine without aura, not intractable, without status migrainosus: Secondary | ICD-10-CM | POA: Diagnosis not present

## 2020-08-18 DIAGNOSIS — Z780 Asymptomatic menopausal state: Secondary | ICD-10-CM | POA: Diagnosis not present

## 2020-08-18 DIAGNOSIS — F321 Major depressive disorder, single episode, moderate: Secondary | ICD-10-CM | POA: Diagnosis not present

## 2020-08-18 DIAGNOSIS — Z13228 Encounter for screening for other metabolic disorders: Secondary | ICD-10-CM | POA: Diagnosis not present

## 2020-08-18 DIAGNOSIS — M85852 Other specified disorders of bone density and structure, left thigh: Secondary | ICD-10-CM | POA: Diagnosis not present

## 2020-08-18 DIAGNOSIS — E7800 Pure hypercholesterolemia, unspecified: Secondary | ICD-10-CM | POA: Diagnosis not present

## 2020-08-18 DIAGNOSIS — R202 Paresthesia of skin: Secondary | ICD-10-CM | POA: Diagnosis not present

## 2020-08-18 DIAGNOSIS — L932 Other local lupus erythematosus: Secondary | ICD-10-CM | POA: Diagnosis not present

## 2020-08-18 DIAGNOSIS — Z0000 Encounter for general adult medical examination without abnormal findings: Secondary | ICD-10-CM | POA: Diagnosis not present

## 2020-08-18 DIAGNOSIS — F419 Anxiety disorder, unspecified: Secondary | ICD-10-CM | POA: Diagnosis not present

## 2020-09-06 ENCOUNTER — Ambulatory Visit (INDEPENDENT_AMBULATORY_CARE_PROVIDER_SITE_OTHER): Payer: Medicare Other | Admitting: Psychiatry

## 2020-09-06 ENCOUNTER — Other Ambulatory Visit: Payer: Self-pay

## 2020-09-06 DIAGNOSIS — F419 Anxiety disorder, unspecified: Secondary | ICD-10-CM | POA: Diagnosis not present

## 2020-09-06 NOTE — Progress Notes (Signed)
[facility] Counselor/Therapist Progress Note  Patient ID: Andrea Burke, MRN: [id number],    Date: 09/06/2020  Time Spent: 48 minutes    3:00pm to 4:00pm   Treatment Type: Individual Therapy  Reported Symptoms:  Anxiety, depression, "anxiety is the stronger one"  Mental Status Exam:  Appearance:   Neat     Behavior:  Appropriate and Sharing  Motor:  Normal  Speech/Language:   Clear and Coherent  Affect:  anxious, some depression  Mood:  anxious  Thought process:  normal  Thought content:    WNL  Sensory/Perceptual disturbances:    WNL  Orientation:  oriented to person, place, time/date, situation, day of week, month of year and year  Attention:  Good  Concentration:  Good and Fair  Memory:  WNL;" but later states he loses track of time sometimes under stress"  Fund of knowledge:   Good  Insight:    Good and Fair  Judgment:   Good/Fair  Impulse Control:  Good   Risk Assessment: Danger to Self:  No Self-injurious Behavior: No Danger to Others: No Duty to Warn:no Physical Aggression / Violence:No  Access to Firearms a concern: No  Gang Involvement:No   Subjective: Patient today reports anxiety and depression, with anxiety being the strongest. States that she is feeling some better about herself. Shares that she is making progress in her view of herself and setting better limits on her expectations of herself "at least in some areas such as tasks at home."    Interventions: Cognitive Behavioral Therapy and Solution-Oriented/Positive Psychology  Diagnosis:   ICD-10-CM   1. Anxiety disorder, unspecified type  F41.9     Plan: Patient not signing tx plan on computer screen due to [place].  Treatment Goals: Goals will remain on tx plan as patient works on strategies to achieve her goals. Progress or lack of, will be documented each week in Progress section of Plan.  Long-Term goal:(measurable) Enhance the ability to handle effectively the full  variety of life's anxieties.Patient will eventually rate herself as a "3 or under on a 1-10 scale for depression and for anxiety, for a period of at least 2 months."  Strategies: Patient will work on re-framing thoughtsthat have been leading her to feel more anxious. The re-framed thoughts will support more calmness and personal empowerment for patient.  Short Term goal: Increase understanding of beliefs and messages that produce worry and anxiety.  Strategies: Patient will work with therapist to gain more understanding of how anxiety and worry are supported by certain beliefs and messages. She will be able to give at least 3 examples of such messages in an upcoming session.  Progress: Patient in today reporting anxiety and depression, with anxiety being the stronger symptom.  Is setting better limits for herself with expectations being more realistic. Reports dealing some better with stress at home especially with renovations going on. Some spike in her anxiety before the holidays and being concerned about being in groups of people (anxeity re: family gatherings and Covid related concerns). Processed concerns about her husband who "is still recovering from a bad fall, and is also a diabetic and his numbers lately have not been good." Elderly mother, age 64, has improved a little but tends to be up and down physically. Sadness not as strong today. Encouraged to look for positives each day, stay in touch with supportive people, get outside some daily, healthy nutrition and exercise, work to make self talk more positive, and staying in  the present.    Goal review and progress/challenges noted with patient.  Next appointment within 3 to 4 weeks.   Shanon Ace, LCSW

## 2020-10-04 ENCOUNTER — Ambulatory Visit (INDEPENDENT_AMBULATORY_CARE_PROVIDER_SITE_OTHER): Payer: Medicare Other | Admitting: Psychiatry

## 2020-10-04 ENCOUNTER — Other Ambulatory Visit: Payer: Self-pay

## 2020-10-04 DIAGNOSIS — F419 Anxiety disorder, unspecified: Secondary | ICD-10-CM | POA: Diagnosis not present

## 2020-10-04 NOTE — Progress Notes (Signed)
[facility] Counselor/Therapist Progress Note  Patient ID: Andrea Burke, MRN: [id number],    Date: 10/04/2020  Time Spent: 50 minutes     2:55pm to 3:45   Treatment Type: Individual Therapy  Reported Symptoms: anxiety, fears/apprehension over Covid is decreasing, feels unmotivated at times and sometimes hard to get out after being inside more over past couple yrs due to Covid  Mental Status Exam:  Appearance:   Well Groomed     Behavior:  Appropriate and Sharing  Motor:  Normal  Speech/Language:   Clear and Coherent  Affect:  anxious, some worry  Mood:  anxious  Thought process:  goal directed  Thought content:    WNL  Sensory/Perceptual disturbances:    WNL  Orientation:  oriented to person, place, time/date, situation, day of week, month of year and year  Attention:  Good  Concentration:  Fair  Memory:  some forgetfulness (has told PCP)  Fund of knowledge:   Good  Insight:    Good and Fair  Judgment:   Good  Impulse Control:  Good   Risk Assessment: Danger to Self:  No Self-injurious Behavior: No Danger to Others: No Duty to Warn:no Physical Aggression / Violence:No  Access to Firearms a concern: No  Gang Involvement:No   Subjective: Patient today reporting anxiety, and some lingering Covid concerns but is decreasing. Worries often but "my anxiety or any depressed feelings are not extreme."   Interventions: Cognitive Behavioral Therapy, Solution-Oriented/Positive Psychology and Ego-Supportive  Diagnosis:   ICD-10-CM   1. Anxiety disorder, unspecified type  F41.9     Plan: Patient not signing tx plan on computer screen due to COVID.  Treatment Goals: Goals will remain on tx plan as patient works on strategies to achieve her goals. Progress or lack of, will be documented each week in Progress section of Plan.  Long-Term goal:(measurable) Enhance the ability to handle effectively the full variety of life's anxieties.Patient will eventually  rate herself as a "3 or under on a 1-10 scale for depression and for anxiety, for a period of at least 2 months."  Strategies: Patient will work on re-framing thoughtsthat have been leading her to feel more anxious. The re-framed thoughts will support more calmness and personal empowerment for patient.  Short Term goal: Increase understanding of beliefs and messages that produce worry and anxiety.  Strategies: Patient will work with therapist to gain more understanding of how anxiety and worry are supported by certain beliefs and messages. She will be able to give at least 3 examples of such messages in an upcoming session.  Progress: Patient in today reporting anxiety and some Covid related concerns but they "are not as extreme as they used to be."  "I do worry often about little details, things that bother me, worry about my family in general ways."  Tearfully processes some of her earlier fears and now is glad to be making some progress, as she shares how fearful she has been in earlier times during pandemic."  Still concerned but believes we are slowly getting better.  Seems more hopeful in some ways although cautious in her optimism.  Seemed more relieved after her earlier tearfulness during session.  Trying to keep her expectations of herself realistic and feels that she is beginning to be more "leveled out" emotionally.  The home stressors of multiple renovations continues to be a work in progress although they are currently waiting on shipments of materials in order to continue.  Enjoyed being around  family at Christmas although it did heighten her COVID concerns some as they were not masked and "were in fairly close quarters".  Husband continues to improve from his earlier fall and also some improvement in his diabetes.  Overall sadness has dissipated some more since last session.  Encouraged patient to practice good self-care including healthy nutrition and walking, getting  outside some daily, positive self talk, focusing on what she can control versus cannot control, staying in the present, and maintaining contact with supportive people.  Goal review and progress/challenges noted with patient.  Next appointment within 4 weeks.   Mathis Fare, LCSW

## 2020-10-21 DIAGNOSIS — Z5181 Encounter for therapeutic drug level monitoring: Secondary | ICD-10-CM | POA: Diagnosis not present

## 2020-10-24 ENCOUNTER — Ambulatory Visit: Payer: Medicare Other | Admitting: Psychiatry

## 2020-11-07 ENCOUNTER — Ambulatory Visit: Payer: Medicare Other | Admitting: Psychiatry

## 2020-11-18 ENCOUNTER — Ambulatory Visit (INDEPENDENT_AMBULATORY_CARE_PROVIDER_SITE_OTHER): Payer: Medicare Other | Admitting: Psychiatry

## 2020-11-18 ENCOUNTER — Encounter: Payer: Self-pay | Admitting: Psychiatry

## 2020-11-18 ENCOUNTER — Other Ambulatory Visit: Payer: Self-pay

## 2020-11-18 DIAGNOSIS — F320 Major depressive disorder, single episode, mild: Secondary | ICD-10-CM | POA: Diagnosis not present

## 2020-11-18 DIAGNOSIS — F419 Anxiety disorder, unspecified: Secondary | ICD-10-CM

## 2020-11-18 MED ORDER — VIIBRYD 40 MG PO TABS: 20.0000 mg | ORAL_TABLET | Freq: Every day | ORAL | 0 refills | Status: DC

## 2020-11-18 NOTE — Progress Notes (Signed)
Andrea Burke [id number] Feb 09, 1946 75 y.o.  Subjective:   Patient ID:  Andrea Burke is a 75 y.o. (DOB [birth year]) female.  Chief Complaint:  Chief Complaint  Patient presents with  . Anxiety    HPI Andrea Burke presents to the office today for follow-up of anxiety and depression. She reports "probably not as much depression." She reports "a little bit" of anxiety. Has had some anxiety with leaving home and will think about "something happening bad," such as an accident on the road, getting sick, etc. Has some anxiety with face to face interactions. Reports that noise of family gatherings causes some anxiety. She reports that she has anticipatory anxiety about upcoming events. Denies SOB or increased HR with anxiety. Notices some muscle tension or heaviness on her shoulders with anxiety. She reports that she falls asleep without difficulty and is going to bed later. Awakens 1-2 times a night to use the bathroom or because her husband awakens. Bad dreams have been significantly less. In the past she has had vivid stress dreams, such as thinking she had rear-ended someone. Her husband has told her she screams out in her sleep. Appetite has been ok. Reports that she is not getting enjoyment from food. She reports energy is "ok." Her motivation has been low. Has not been getting out or having people over. Has not been motivated to clean. Has not been able to do as much outside due to cold weather. She reports difficulty with concentration and reports that they receive several magazines and she cannot complete an article. She reports that she has difficulty sitting through a TV show. She notices some difficulty with memory, such as not being able to remember the names of previous coworkers or TV personalities. Has to write out math problems that she used to do in her head. Reports that one night she went to bed without turning off lights or TV. Denies SI.   She reports minimal social  gatherings. Her mother is in her 92's. 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   Has started volunteering again at [employer] [facility] [employer].  Husband had surgery. They have not been to the gym recently.  Past medication trials: Remeron-vivid dreams, increased appetite Lexapro-nightmares Sertraline-agitation, insomnia, impaired concentration Paxil-intolerable side effects Fluoxetine-intolerable side effects Viibryd- Partial improvement on 20 mg. Does not want to take doses above 20 mg. Wellbutrin- Severe tremors Lithium Hydroxyzine Xanax [place] Office Visit from 11/18/2020 in [place] Office Visit from 04/25/2020 in [facility]  Total Score (max 30 points ) 29 29       Review of Systems:  Review of Systems  Musculoskeletal: Positive for arthralgias and back pain. Negative for gait problem.  Neurological: Positive for tremors.  Psychiatric/Behavioral:       Please refer to HPI    Had COVID a month ago. Had sore throat and a runny nose.  Medications: I have reviewed the patient's current medications.  Current Outpatient Medications  Medication Sig Dispense Refill  . Calcium Carbonate-Vitamin D (CALTRATE 600+D PO) Take by mouth.    . finasteride (PROSCAR) 5 MG tablet Take 2.5 mg by mouth daily.    . fish oil-omega-3 fatty acids 1000 MG capsule Take 2 g by mouth daily.    . hydroxychloroquine (PLAQUENIL) 200 MG tablet Take by mouth daily.    . Multiple Vitamins-Minerals (CENTRUM  PO) Take by mouth.    [name] Kitchen BUTALBITAL-ACETAMINOPHEN PO Take by mouth.    . Vilazodone HCl (VIIBRYD) 40 MG TABS Take 0.5 tablets (20 mg total) by mouth daily. 90 tablet 0   No current facility-administered medications for this visit.    Medication Side Effects: None  Allergies:  Allergies  Allergen Reactions  . Doxycycline Calcium   . Penicillins     Past  Medical History:  Diagnosis Date  . Cancer (HCC)    KIDNEY  . Discoid lupus   . Endometriosis   . Headache(784.0)   . Insomnia   . Kidney disease   . Lupus ([place])   . Osteopenia   . Urinary incontinence     Family History  Problem Relation Age of Onset  . Hypertension Mother   . Hyperlipidemia Mother   . Tremor Mother   . Breast cancer Sister   . Hyperlipidemia Father   . Hypertension Father   . Dementia Father   . Tremor Father   . Cancer Sister   . Cancer Other     Social History   Socioeconomic History  . Marital status: Married    Spouse name: Not on file  . Number of children: Not on file  . Years of education: Not on file  . Highest education level: Not on file  Occupational History  . Not on file  Tobacco Use  . Smoking status: Former Research scientist (life sciences)  . Smokeless tobacco: Never Used  Vaping Use  . Vaping Use: Never used  Substance and Sexual Activity  . Alcohol use: Yes    Comment: rare  . Drug use: Not on file  . Sexual activity: Never    Birth control/protection: Post-menopausal  Other Topics Concern  . Not on file  Social History Narrative  . Not on file   Social Determinants of Health   Financial Resource Strain: Not on file  Food Insecurity: Not on file  Transportation Needs: Not on file  Physical Activity: Not on file  Stress: Not on file  Social Connections: Not on file  Intimate Partner Violence: Not on file    Past Medical History, Surgical history, Social history, and Family history were reviewed and updated as appropriate.   Please see review of systems for further details on the patient's review from today.   Objective:   Physical Exam:  There were no vitals taken for this visit.  Physical Exam Constitutional:      General: She is not in acute distress. Musculoskeletal:        General: No deformity.  Neurological:     Mental Status: She is alert and oriented to person, place, and time.     Coordination: Coordination normal.   Psychiatric:        Attention and Perception: Attention and perception normal. She does not perceive auditory or visual hallucinations.        Mood and Affect: Mood is anxious. Mood is not depressed. Affect is not labile, blunt, angry or inappropriate.        Speech: Speech normal.        Behavior: Behavior is cooperative.        Thought Content: Thought content normal. Thought content is not paranoid or delusional. Thought content does not include homicidal or suicidal ideation. Thought content does not include homicidal or suicidal plan.        Cognition and Memory: Cognition and memory normal.        Judgment: Judgment normal.  Comments: Insight intact Speech is soft.     Lab Review:     Component Value Date/Time   NA 138 07/31/2014 1222   K 4.2 07/31/2014 1222   CL 107 07/31/2014 1222   CO2 24 07/31/2014 1222   GLUCOSE 88 07/31/2014 1222   BUN 11 07/31/2014 1222   CREATININE 0.72 07/31/2014 1222   CALCIUM 9.5 07/31/2014 1222   PROT 7.5 07/31/2014 1222   ALBUMIN 4.3 07/31/2014 1222   AST 21 07/31/2014 1222   ALT 17 07/31/2014 1222   ALKPHOS 54 07/31/2014 1222   BILITOT 0.6 07/31/2014 1222       Component Value Date/Time   WBC 3.7 (L) 09/29/2018 0951   RBC 4.86 09/29/2018 0951   HGB 14.1 09/29/2018 0951   HCT 41.8 09/29/2018 0951   PLT 183 09/29/2018 0951   MCV 86.0 09/29/2018 0951   MCV 86.6 07/31/2014 1222   MCH 29.0 09/29/2018 0951   MCHC 33.7 09/29/2018 0951   RDW 12.2 09/29/2018 0951   LYMPHSABS 1,887 09/29/2018 0951   EOSABS 122 09/29/2018 0951   BASOSABS 30 09/29/2018 0951    No results found for: POCLITH, LITHIUM   No results found for: PHENYTOIN, PHENOBARB, VALPROATE, CBMZ   .res Assessment: Plan:   Patient seen for 30 minutes of time spent counseling patient regarding possible treatment options, to include option of continuing current dose of Viibryd or increasing Viibryd to 20 mg daily since anxiety may have been better controlled at this dose.   Patient agrees to increase in Viibryd to 20 mg daily. Recommend continuing psychotherapy with Rinaldo Cloud, LCSW.  Pt to follow-up in 3 months or sooner if clinically indicated.  Patient advised to contact office with any questions, adverse effects, or acute worsening in signs and symptoms.  Andrea Burke was seen today for anxiety.  Diagnoses and all orders for this visit:  Major depressive disorder, single episode, mild (HCC) -     Vilazodone HCl (VIIBRYD) 40 MG TABS; Take 0.5 tablets (20 mg total) by mouth daily.  Anxiety disorder, unspecified type -     Vilazodone HCl (VIIBRYD) 40 MG TABS; Take 0.5 tablets (20 mg total) by mouth daily.     Please see After Visit Summary for patient specific instructions.  Future Appointments  Date Time Provider [place]  12/15/2020  4:00 PM Shanon Ace, [place] CP-CP None  01/17/2021  4:00 PM Shanon Ace, LCSW CP-CP None  02/15/2021 10:00 AM Thayer Headings, PMHNP CP-CP None    No orders of the defined types were placed in this encounter.   -------------------------------

## 2020-12-15 ENCOUNTER — Ambulatory Visit (INDEPENDENT_AMBULATORY_CARE_PROVIDER_SITE_OTHER): Payer: Medicare Other | Admitting: Psychiatry

## 2020-12-15 ENCOUNTER — Other Ambulatory Visit: Payer: Self-pay

## 2020-12-15 DIAGNOSIS — F419 Anxiety disorder, unspecified: Secondary | ICD-10-CM | POA: Diagnosis not present
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
## 2020-12-19 ENCOUNTER — Ambulatory Visit: Payer: Medicare Other | Admitting: Psychiatry

## 2021-01-02 ENCOUNTER — Other Ambulatory Visit: Payer: Self-pay

## 2021-01-02 ENCOUNTER — Ambulatory Visit (INDEPENDENT_AMBULATORY_CARE_PROVIDER_SITE_OTHER): Payer: Medicare Other | Admitting: Psychiatry

## 2021-01-02 DIAGNOSIS — F419 Anxiety disorder, unspecified: Secondary | ICD-10-CM | POA: Diagnosis not present
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
## 2021-01-17 ENCOUNTER — Ambulatory Visit: Payer: Medicare Other | Admitting: Psychiatry

## 2021-01-26 ENCOUNTER — Ambulatory Visit (INDEPENDENT_AMBULATORY_CARE_PROVIDER_SITE_OTHER): Payer: Medicare Other | Admitting: Psychiatry

## 2021-01-26 ENCOUNTER — Other Ambulatory Visit: Payer: Self-pay

## 2021-01-26 DIAGNOSIS — F419 Anxiety disorder, unspecified: Secondary | ICD-10-CM | POA: Diagnosis not present
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
## 2021-02-15 ENCOUNTER — Ambulatory Visit (INDEPENDENT_AMBULATORY_CARE_PROVIDER_SITE_OTHER): Payer: Medicare Other | Admitting: Psychiatry

## 2021-02-15 ENCOUNTER — Encounter: Payer: Self-pay | Admitting: Psychiatry

## 2021-02-15 ENCOUNTER — Other Ambulatory Visit: Payer: Self-pay

## 2021-02-15 DIAGNOSIS — F419 Anxiety disorder, unspecified: Secondary | ICD-10-CM

## 2021-02-15 DIAGNOSIS — F320 Major depressive disorder, single episode, mild: Secondary | ICD-10-CM | POA: Diagnosis not present

## 2021-02-15 MED ORDER — VIIBRYD 40 MG PO TABS: ORAL_TABLET | ORAL | 0 refills | Status: DC

## 2021-02-15 NOTE — Progress Notes (Signed)
Andrea Burke [id number] 1946-04-18 75 y.o.  Subjective:   Patient ID:  Andrea Burke is a 75 y.o. (DOB 25-Jul-1946) female.  Chief Complaint:  Chief Complaint  Patient presents with  . Anxiety  . Depression    HPI Andrea Burke presents to the office today for follow-up of depression and anxiety. She reports sad mood. "Just getting through each day." She reports that interest and motivation is low. Feels tired throughout the day. She reports that she has had "a couple of spikes" in anxiety in response to situational factors. She reports overall anxiety had been ok. Has had some crying episodes, sometimes related to grieving her dog. Sleeping ok most nights. Occ difficulty falling asleep. Has "terrible" dreams on rare occasions. Dreams are vivid at times. Appetite has been lower and has occasionally skipped meals. She reports poor concentration. Denies SI.   Remodeling has been delayed due to supply shortages. Her cat died and several months later had to put her dog down. She reports that she continues to grieve the loss of her dog in late February. "I lost my support... 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 She reports that they took their first trip since the start of the pandemic. Mother is 58 years old and lives in the [place] area.    Past medication trials: Remeron-vivid dreams, increased appetite Lexapro-nightmares Sertraline-agitation, insomnia, impaired concentration Paxil-intolerable side effects Fluoxetine-intolerable side effects Viibryd- Partial improvement on 20 mg. Does not want to take doses above 20 mg. Wellbutrin- Severe tremors Lithium Hydroxyzine Xanax [place] Office Visit from 11/18/2020 in [facility] Office Visit from 04/25/2020 in [facility]  Total Score (max 30 points ) 29 29       Review of Systems:  Review of Systems   Gastrointestinal:       Loose stools  Musculoskeletal: Positive for arthralgias and myalgias. Negative for gait problem.  Psychiatric/Behavioral:       Please refer to HPI    Medications: I have reviewed the patient's current medications.  Current Outpatient Medications  Medication Sig Dispense Refill  . BUTALBITAL-ACETAMINOPHEN PO Take by mouth.    . Calcium Carbonate-Vitamin D (CALTRATE 600+D PO) Take by mouth.    . finasteride (PROSCAR) 5 MG tablet Take 2.5 mg by mouth daily.    . fish oil-omega-3 fatty acids 1000 MG capsule Take 2 g by mouth daily.    . hydroxychloroquine (PLAQUENIL) 200 MG tablet Take by mouth daily.    . Multiple Vitamins-Minerals (CENTRUM PO) Take by mouth.    . Vilazodone HCl (VIIBRYD) 40 MG TABS Take 30 mg daily 90 tablet 0   No current facility-administered medications for this visit.    Medication Side Effects: None  Allergies:  Allergies  Allergen Reactions  . Doxycycline Calcium   . Penicillins     Past Medical History:  Diagnosis Date  . Cancer (HCC)    KIDNEY  . Discoid lupus   . Endometriosis   . Headache(784.0)   . Insomnia   . Kidney disease   . Lupus ([place])   . Osteopenia   . Urinary incontinence     Past Medical History, Surgical history, Social history, and Family history were reviewed and updated as appropriate.   Please see review of systems for further details on the patient's review from today.   Objective:   Physical Exam:  There were no vitals taken for this  visit.  Physical Exam Constitutional:      General: She is not in acute distress. Musculoskeletal:        General: No deformity.  Neurological:     Mental Status: She is alert and oriented to person, place, and time.     Coordination: Coordination normal.  Psychiatric:        Attention and Perception: Attention and perception normal. She does not perceive auditory or visual hallucinations.        Mood and Affect: Mood normal. Mood is not anxious or  depressed. Affect is not labile, blunt, angry or inappropriate.        Speech: Speech normal.        Behavior: Behavior normal.        Thought Content: Thought content normal. Thought content is not paranoid or delusional. Thought content does not include homicidal or suicidal ideation. Thought content does not include homicidal or suicidal plan.        Cognition and Memory: Cognition and memory normal.        Judgment: Judgment normal.     Comments: Insight intact     Lab Review:     Component Value Date/Time   NA 138 07/31/2014 1222   K 4.2 07/31/2014 1222   CL 107 07/31/2014 1222   CO2 24 07/31/2014 1222   GLUCOSE 88 07/31/2014 1222   BUN 11 07/31/2014 1222   CREATININE 0.72 07/31/2014 1222   CALCIUM 9.5 07/31/2014 1222   PROT 7.5 07/31/2014 1222   ALBUMIN 4.3 07/31/2014 1222   AST 21 07/31/2014 1222   ALT 17 07/31/2014 1222   ALKPHOS 54 07/31/2014 1222   BILITOT 0.6 07/31/2014 1222       Component Value Date/Time   WBC 3.7 (L) 09/29/2018 0951   RBC 4.86 09/29/2018 0951   HGB 14.1 09/29/2018 0951   HCT 41.8 09/29/2018 0951   PLT 183 09/29/2018 0951   MCV 86.0 09/29/2018 0951   MCV 86.6 07/31/2014 1222   MCH 29.0 09/29/2018 0951   MCHC 33.7 09/29/2018 0951   RDW 12.2 09/29/2018 0951   LYMPHSABS 1,887 09/29/2018 0951   EOSABS 122 09/29/2018 0951   BASOSABS 30 09/29/2018 0951    No results found for: POCLITH, LITHIUM   No results found for: PHENYTOIN, PHENOBARB, VALPROATE, CBMZ   .res Assessment: Plan:    Discussed trying to moving administration time of Viibryd to determine if Viibryd interferes with sleep since it can cause some increased activation.  Discussed potential benefits, risks, and side effects of increasing Viibryd to further improve mood and anxiety.  Patient agrees to increasing dose to 30 mg daily.  Patient reports that she may be able to split tablets that she has on hand to equal 30 mg dose and would like to try to use current supply and since  she recently received a 90-day shipment.  Advised patient to contact office if she needs prescription for different strength tablets. Recommend continuing psychotherapy with Rinaldo Cloud, LCSW. Patient follow-up with this provider in 2 months or sooner if clinically indicated. Patient advised to contact office with any questions, adverse effects, or acute worsening in signs and symptoms.   Andrea Burke was seen today for anxiety and depression.  Diagnoses and all orders for this visit:  Major depressive disorder, single episode, mild (HCC) -     Vilazodone HCl (VIIBRYD) 40 MG TABS; Take 30 mg daily  Anxiety disorder, unspecified type -     Vilazodone HCl (VIIBRYD) 40 MG TABS;  Take 30 mg daily     Please see After Visit Summary for patient specific instructions.  Future Appointments  Date Time Provider [place]  02/28/2021  4:00 PM Shanon Ace, [place] [facility] None  04/18/2021  3:30 PM Thayer Headings, PMHNP CP-CP None    No orders of the defined types were placed in this encounter.   -------------------------------

## 2021-02-28 ENCOUNTER — Other Ambulatory Visit: Payer: Self-pay

## 2021-02-28 ENCOUNTER — Ambulatory Visit (INDEPENDENT_AMBULATORY_CARE_PROVIDER_SITE_OTHER): Payer: Medicare Other | Admitting: Psychiatry

## 2021-02-28 DIAGNOSIS — F320 Major depressive disorder, single episode, mild: Secondary | ICD-10-CM

## 2021-02-28 NOTE — Progress Notes (Signed)
[facility] Counselor/Therapist Progress Note  Patient ID: Andrea Burke, MRN: [id number],    Date: 02/28/2021  Time Spent: 60 minutes  Treatment Type: Individual Therapy  Reported Symptoms:, depression, anxiety, states anxiety is stronger than depression (60/40)  Mental Status Exam:  Appearance:   Casual and Well Groomed     Behavior:  Appropriate, Sharing and Motivated  Motor:  Normal  Speech/Language:   Clear and Coherent  Affect:  anxious, depressed  Mood:  anxious and depressed  Thought process:  normal  Thought content:    WNL  Sensory/Perceptual disturbances:    WNL  Orientation:  oriented to person, place, time/date, situation, day of week, month of year and year  Attention:  Good  Concentration:  Fair  Memory:  WNL  Fund of knowledge:   Good  Insight:    Fair  Judgment:   Good  Impulse Control:  Good   Risk Assessment: Danger to Self:  No Self-injurious Behavior: No Danger to Others: No Duty to Warn:no Physical Aggression / Violence:No  Access to Firearms a concern: No  Gang Involvement:No   Subjective:  She is in today reporting anxiety, depression, with anxiety reported to be stronger symptom.  Still having some sleep difficulties "with a few bad dreams".  Took her "newer medication for sleeping which has helped some."  States "I'm getting through the days but I'm not feeling up or happy." Denies any SI. Still grieving her dog's death some as she was extremely close to her. Processed more of her thoughts and emotions surrounding her feelings of grief. Did later state she will likely get another dog later on, but needs some more time now. Anxious/depressive thoughts mostly related to personal, family, and "all the world violence", which she is encouraged to really limit her consumption of the world violence seen online or TV.  Really needed part of the session to process some of her "built-up fears" about world violence issues and did a good job today in  sharing her fearful/anxious thoughts and seemed more grounded and tearfully stated several times "it helps to let my feelings out". Plans to focus more on some positives, including her extended family that can help feed her emotionally in more positive ways.    Interventions: Solution-Oriented/Positive Psychology, Ego-Supportive and Insight-Oriented  Diagnosis:   ICD-10-CM   1. Major depressive disorder, single episode, mild ([place])  F32.0     Treatment goal plan:  Patient not signing tx plan on computer screen due to [place]. Treatment Goals: Goals will remain on tx plan as patient works on strategies to achieve her goals. Progress or lack of, will be documented each week in Progress section of Plan. Long-Term goal:(measurable) Enhance the ability to handle effectively the full variety of life's anxieties.Patient will eventually rate herself as a "3 or under on a 1-10 scale for depression and for anxiety, for a period of at least 2 months." Strategies: Patient will work on re-framing thoughtsthat have been leading her to feel more anxious. The re-framed thoughts will support more calmness and personal empowerment for patient. Short Term goal: Increase understanding of beliefs and messages that produce worry and anxiety. Strategies: Patient will work with therapist to gain more understanding of how anxiety and worry are supported by certain beliefs and messages. She will be able to give at least 3 examples of such messages in an upcoming session.   Progress / Plan: Today patient was more emotional and described her fears and concerns, depression/anxiety especially  about the world situation with all the violence going on.  As noted above she processed a lot of her emotions in session about that, in addition to still working through the grief over her dog's death.  Patient did really well in session and speaking more directly about the things that trouble her and in venting her  emotions.  Encouraged her to continue setting limits on distressing news online or TV or in talking with other people, and trying to have a healthier balance of meaningful and fun time spent with family/friends and doing things that she enjoys including spending time in her yard.  Encouraged her to refrain from assuming the worst case scenarios, to stay in the present and focused on what she can control or change, to have good contact with people who are supportive of her, intentionally look for more positives than negatives daily, spend time outside daily, practice more consistent positive self talk, and to feel encouraged by the strength she has shown today in facing difficult issues and her own emotions that have been some stronger lately.  Shared at end of session that she felt like her emotions had really built up over time and came out today.  She noted that every day is not like this and she actually felt more relieved and grounded at this point.   Goal review and progress/challenges noted with patient.  Next appointment within approx 3 weeks and knows she can call before then if needed.   Shanon Ace, LCSW

## 2021-03-13 DIAGNOSIS — K649 Unspecified hemorrhoids: Secondary | ICD-10-CM | POA: Diagnosis not present

## 2021-03-13 DIAGNOSIS — M549 Dorsalgia, unspecified: Secondary | ICD-10-CM | POA: Diagnosis not present

## 2021-03-13 DIAGNOSIS — F419 Anxiety disorder, unspecified: Secondary | ICD-10-CM | POA: Diagnosis not present

## 2021-03-13 DIAGNOSIS — M62838 Other muscle spasm: Secondary | ICD-10-CM | POA: Diagnosis not present

## 2021-03-13 DIAGNOSIS — T753XXA Motion sickness, initial encounter: Secondary | ICD-10-CM | POA: Diagnosis not present

## 2021-03-13 DIAGNOSIS — R030 Elevated blood-pressure reading, without diagnosis of hypertension: Secondary | ICD-10-CM | POA: Diagnosis not present

## 2021-03-13 DIAGNOSIS — F321 Major depressive disorder, single episode, moderate: Secondary | ICD-10-CM | POA: Diagnosis not present

## 2021-03-13 DIAGNOSIS — R197 Diarrhea, unspecified: Secondary | ICD-10-CM | POA: Diagnosis not present

## 2021-04-04 ENCOUNTER — Other Ambulatory Visit: Payer: Self-pay

## 2021-04-04 ENCOUNTER — Ambulatory Visit (INDEPENDENT_AMBULATORY_CARE_PROVIDER_SITE_OTHER): Payer: Medicare Other | Admitting: Psychiatry

## 2021-04-04 DIAGNOSIS — F419 Anxiety disorder, unspecified: Secondary | ICD-10-CM | POA: Diagnosis not present

## 2021-04-04 NOTE — Progress Notes (Signed)
[facility] Counselor/Therapist Progress Note  Patient ID: Andrea Burke, MRN: [id number],    Date: 04/04/2021  Time Spent: 60 minutes     Treatment Type: Individual Therapy  Reported Symptoms: anxiety, some depression and add that it has decreased some  Mental Status Exam:  Appearance:   Casual and Neat     Behavior:  Appropriate, Sharing, and "somewhat motivated"  Motor:  Normal  Speech/Language:   Clear and Coherent  Affect:  anxious  Mood:  anxious and some depression  Thought process:  goal directed  Thought content:    WNL  Sensory/Perceptual disturbances:    WNL  Orientation:  oriented to person, place, time/date, situation, day of week, month of year, year, and stated date of April 04, 2021  Attention:  Fair  Concentration:  Fair  Memory:  [place] of knowledge:   Good  Insight:    Good and Fair  Judgment:   Good  Impulse Control:  Good   Risk Assessment: Danger to Self:  No Self-injurious Behavior: No Danger to Others: No Duty to Warn:no Physical Aggression / Violence:No  Access to Firearms a concern: No  Gang Involvement:No   Subjective:  Patient in today reporting anxiety as her stronger symptom, and some depression which has decreased some. Reports sleep had gotten some better but more recently had more dreams again "however the ones previously were more nightmares and these current ones are not as scary nor disturbing but rather they're just dreams." Mood-wise, she reports not really getting out for anything special but noticing some "feeling up" or at peace at home.  Shares some thoughts and feelings about her still grieving some over the loss of her pet dog.  Is getting better but realizes she is still having occasional periods of grief that is brief and patient is able to re-engage with whatever she was doing previous and move forward. Is considering the possibility of another dog in the future at some point. Having some anxious/depressive thoughts  related to family, personal, and world violence.  Patient has limited her watching of world conflict on TV ore online and that is  helping some. Able to focus some on "positives" today which we identified in session.  Affect more full today, including more smiling.   Interventions: Cognitive Behavioral Therapy, Solution-Oriented/Positive Psychology, and Ego-Supportive  Diagnosis:   ICD-10-CM   1. Anxiety disorder, unspecified type  F41.9       Plan:  Patient today showing good motivation in working with treatment goals and processing of thoughts and feeling that have been difficult for her, but also sharing some progress as noted above.  Encouraged patient to use some behaviors that have been helpful between sessions previously including having a healthier balance of meaningful and fun time spent with family/friends, doing yard work, setting limits on watching distressing news on TV/online, refrain from assuming worst case scenarios, stay in the present focusing on what she can control or change, get outside daily, practice more consistent positive self talk, intentionally look for more positives daily, to have good contact with people who are supportive of her, and to feel good about the strength she is showing as she works with goal-directed behaviors in the midst of stressful circumstances in trying to move forward in a more positive direction towards improved emotional health.  Goal review and progress/challenges noted with patient.  Next appt within 3-4 weeks.   Shanon Ace, LCSW

## 2021-04-18 ENCOUNTER — Encounter: Payer: Self-pay | Admitting: Psychiatry

## 2021-04-18 ENCOUNTER — Other Ambulatory Visit: Payer: Self-pay

## 2021-04-18 ENCOUNTER — Ambulatory Visit (INDEPENDENT_AMBULATORY_CARE_PROVIDER_SITE_OTHER): Payer: Medicare Other | Admitting: Psychiatry

## 2021-04-18 DIAGNOSIS — F320 Major depressive disorder, single episode, mild: Secondary | ICD-10-CM | POA: Diagnosis not present

## 2021-04-18 DIAGNOSIS — F419 Anxiety disorder, unspecified: Secondary | ICD-10-CM

## 2021-04-18 MED ORDER — VILAZODONE HCL 40 MG PO TABS: ORAL_TABLET | ORAL | 0 refills | Status: DC

## 2021-04-18 NOTE — Progress Notes (Signed)
Andrea Burke [id number] March 12, 1946 75 y.o.  Subjective:   Patient ID:  Andrea Burke is a 75 y.o. (DOB 1945/11/30) female.  Chief Complaint:  Chief Complaint  Patient presents with   Anxiety   Depression     Anxiety    Depression        Past medical history includes anxiety.   Andrea Burke presents to the office today for follow-up of anxiety and depression. She reports that she has increased Viibryd to 40 mg daily. She reports that she has occasional trouble falling asleep some nights and then will take a nap. She reports that she has some dreams that are not "scary, just exhausting." She reports that these occur 3-4 nights out of the week. Describes mood as "not completely at peace... anxiety more than depression." She reports that she has some anxiety in response to family issues and world events. She reports that she is able to get her mind on something else and redirect anxious thoughts. Continues to grieve the loss of her dog who was her companion. She reports that she will occasionally experience sad mood. She reports that her energy and motivation is fair, ie. She will pull things out to start a project and then put them off. She is completing some new tasks. Concentration "comes and goes." She reports some weight gain over the last 3 years. Denies any change in appetite. Denies SI.   She reports that her husband is wanting to go on a cruise and has started making plans for a cruise in November. She reports that she has some anxiety about this. Husband has had multiple health issues and has fallen several times.   Past medication trials: Remeron- vivid dreams, increased appetite Lexapro-nightmares Sertraline-agitation, insomnia, impaired concentration Paxil-intolerable side effects Fluoxetine-intolerable side effects Viibryd- Partial improvement on 20 mg. Does not want to take doses above 20 mg. Wellbutrin- Severe  tremors Lithium Hydroxyzine Xanax [place] Office Visit from 11/18/2020 in [place] Office Visit from 04/25/2020 in [facility]  Total Score (max 30 points ) 29 29        Review of Systems:  Review of Systems  Gastrointestinal:  Positive for diarrhea.       Reports that she may have diarrhea once daily and then not have it for a few days.   Musculoskeletal:  Positive for arthralgias. Negative for gait problem.  Skin:        She reports that she will experience psoriasis if she spends time outside.  Neurological:        Denies any severe migraines  Psychiatric/Behavioral:  Positive for depression.        Please refer to HPI   Medications: I have reviewed the patient's current medications.  Current Outpatient Medications  Medication Sig Dispense Refill   BUTALBITAL-ACETAMINOPHEN PO Take by mouth.     Calcium Carbonate-Vitamin D (CALTRATE 600+D PO) Take by mouth.     finasteride (PROSCAR) 5 MG tablet Take 2.5 mg by mouth daily.     fish oil-omega-3 fatty acids 1000 MG capsule Take 2 g by mouth daily.     hydroxychloroquine (PLAQUENIL) 200 MG tablet Take by mouth daily.     Multiple Vitamins-Minerals (CENTRUM PO) Take by mouth.     Vilazodone HCl (VIIBRYD) 40 MG TABS Take 1/2-1 tablet daily 90 tablet 0   No current facility-administered medications for this visit.    Medication Side Effects: Other: Some increase in  diarrhea  Allergies:  Allergies  Allergen Reactions   Doxycycline Calcium    Penicillins     Past Medical History:  Diagnosis Date   Cancer ([place])    KIDNEY   Discoid lupus    Endometriosis    Headache(784.0)    Insomnia    Kidney disease    Lupus ([place])    Osteopenia    Urinary incontinence     Past Medical History, Surgical history, Social history, and Family history were reviewed and updated as appropriate.   Please see review of systems for further details on the patient's review  from today.   Objective:   Physical Exam:  Wt 145 lb (65.8 kg)   BMI 28.32 kg/m   Physical Exam Constitutional:      General: She is not in acute distress. Musculoskeletal:        General: No deformity.  Neurological:     Mental Status: She is alert and oriented to person, place, and time.     Coordination: Coordination normal.  Psychiatric:        Attention and Perception: Attention and perception normal. She does not perceive auditory or visual hallucinations.        Mood and Affect: Affect is not labile, blunt, angry or inappropriate.        Speech: Speech normal.        Behavior: Behavior normal.        Thought Content: Thought content normal. Thought content is not paranoid or delusional. Thought content does not include homicidal or suicidal ideation. Thought content does not include homicidal or suicidal plan.        Cognition and Memory: Cognition and memory normal.        Judgment: Judgment normal.     Comments: Insight intact Mood presents as mildly anxious and depressed    Lab Review:     Component Value Date/Time   NA 138 07/31/2014 1222   K 4.2 07/31/2014 1222   CL 107 07/31/2014 1222   CO2 24 07/31/2014 1222   GLUCOSE 88 07/31/2014 1222   BUN 11 07/31/2014 1222   CREATININE 0.72 07/31/2014 1222   CALCIUM 9.5 07/31/2014 1222   PROT 7.5 07/31/2014 1222   ALBUMIN 4.3 07/31/2014 1222   AST 21 07/31/2014 1222   ALT 17 07/31/2014 1222   ALKPHOS 54 07/31/2014 1222   BILITOT 0.6 07/31/2014 1222       Component Value Date/Time   WBC 3.7 (L) 09/29/2018 0951   RBC 4.86 09/29/2018 0951   HGB 14.1 09/29/2018 0951   HCT 41.8 09/29/2018 0951   PLT 183 09/29/2018 0951   MCV 86.0 09/29/2018 0951   MCV 86.6 07/31/2014 1222   MCH 29.0 09/29/2018 0951   MCHC 33.7 09/29/2018 0951   RDW 12.2 09/29/2018 0951   LYMPHSABS 1,887 09/29/2018 0951   EOSABS 122 09/29/2018 0951   BASOSABS 30 09/29/2018 0951    No results found for: POCLITH, LITHIUM   No results found  for: PHENYTOIN, PHENOBARB, VALPROATE, CBMZ   .res Assessment: Plan:   Pt seen for 30 minutes and time spent counseling pt regarding treatment options to include continuing Viibryd 30 mg po qd. She reports that she would prefer to decrease dose of Viibryd to 20 mg daily due to concerns about possible weight gain. Will decrease Viibryd to 20 mg po qd for mood and anxiety.  Recommend continuing therapy with Rinaldo Cloud, LCSW.  Pt to follow-up in 2 months or sooner if clinically indicated.  Patient advised to contact office with any questions, adverse effects, or acute worsening in signs and symptoms.  Andrea Burke was seen today for anxiety and depression.  Diagnoses and all orders for this visit:  Major depressive disorder, single episode, mild (HCC) -     Vilazodone HCl (VIIBRYD) 40 MG TABS; Take 1/2-1 tablet daily  Anxiety disorder, unspecified type -     Vilazodone HCl (VIIBRYD) 40 MG TABS; Take 1/2-1 tablet daily    Please see After Visit Summary for patient specific instructions.  Future Appointments  Date Time Provider [place]  05/11/2021  4:00 PM Shanon Ace, [place] [facility] None  06/13/2021  3:30 PM Thayer Headings, PMHNP CP-CP None    No orders of the defined types were placed in this encounter.   -------------------------------

## 2021-05-11 ENCOUNTER — Other Ambulatory Visit: Payer: Self-pay

## 2021-05-11 ENCOUNTER — Ambulatory Visit (INDEPENDENT_AMBULATORY_CARE_PROVIDER_SITE_OTHER): Payer: Medicare Other | Admitting: Psychiatry

## 2021-05-11 DIAGNOSIS — F320 Major depressive disorder, single episode, mild: Secondary | ICD-10-CM

## 2021-05-11 NOTE — Progress Notes (Signed)
[facility] Counselor/Therapist Progress Note  Patient ID: Andrea Burke, MRN: [id number],    Date: 05/11/2021  Time Spent: 58 minutes   Treatment Type: Individual Therapy  Reported Symptoms: anxiety and depression "and it has improved some"  Mental Status Exam:  Appearance:   Casual and Well Groomed     Behavior:  Appropriate, Sharing, and motivated some  Motor:  Normal  Speech/Language:   Clear and Coherent  Affect:  Anxious, depressed  Mood:  anxious and depressed  Thought process:  goal directed  Thought content:    Some obsessiveness and overthinking  Sensory/Perceptual disturbances:    WNL  Orientation:  oriented to person, place, time/date, situation, day of week, month of year, year, and stated date of Aug. 4, 2022  Attention:  Fair  Concentration:  Fair  Memory:  Reports forgetting names occasionally ; sometimes forgot to lock door at [employer] of knowledge:   Good  Insight:    Good and Fair  Judgment:   Good  Impulse Control:  Good   Risk Assessment: Danger to Self:  No Self-injurious Behavior: No Danger to Others: No Duty to Warn:no Physical Aggression / Violence:No  Access to Firearms a concern: No  Gang Involvement:No   Subjective:   Patient in today reporting depression this session as her stronger symptom, and some anxiety.  Patient reports both have decreased over past few years. Is sleeping ok but dreams still continue and patient feels it "sometimes leads to poorer quality sleep on particular nights".  She plans to mention this to her med provider at her upcoming appointment.. Other than that, patient states that she feels she's leveled-out some emotionally and handling some things better. 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 Spoke about how difficult the pandemic has been at ties and continues to be with so much negativity going  on in the world. Is refraining from watching much TV or online news of disturbing events. Despite some of her tearfulness today, she is also recognizing some of her growth and improvement. Working to not be as hard on herself and feel more self-accepting.   Interventions: Cognitive Behavioral Therapy and Ego-Supportive  Diagnosis:   ICD-10-CM   1. Major depressive disorder, single episode, mild ([place])  F32.0       Treatment goal plan:  Patient not signing tx plan on computer screen due to [place].  Treatment Goals:  Goals will remain on tx plan as patient works on strategies to achieve her goals.  Progress or lack of, will be documented each week in Progress section of Plan. Long-Term goal: (measurable)  Enhance the ability to handle effectively the full variety of life's anxieties. Patient will eventually rate herself as a "3 or under on a 1-10 scale for depression and for anxiety, for a period of at least 2 months."  Strategies:  Patient will work on re-framing thoughts that have been leading her to feel more anxious.  The re-framed thoughts will support more calmness and personal empowerment for patient.     Short Term goal:  Increase understanding of beliefs and messages that produce worry and anxiety. Strategies: Patient will work with therapist to gain more understanding of how anxiety and worry are supported by certain beliefs and messages.  She will be able to give at least 3 examples of such messages in an upcoming session.  Plan:  Patient today showing motivation which increased over the course of session and working with her treatment goals and her processing a lot of difficult emotions regarding family, self doubt, but also sensing some progress within herself.  She is a pleasant lady and very sensitive towards others.  Encouraged her to practice some behaviors that have been helpful for her previously including: Staying in the present focusing on what she can control, having a  healthier balance of meaningful and fun time to spend with family/friends, doing yard work which she says is therapeutic for her, setting limits on watching distressing news or TV/online, refrain from assuming worst case scenarios, stop self negating, practice consistent positive self talk, intentionally look for more positives than negatives daily, have good contact with people who are supportive of her, look for the positives within herself, and recognize the strength she is showing as she works with goal-directed behaviors in the midst of challenging circumstances as she tries to move forward in a more positive direction towards improved emotional health.  Goal review and progress/challenges noted with patient.  Next appointment within 3 to 4 weeks.   Shanon Ace, LCSW

## 2021-05-31 DIAGNOSIS — M5459 Other low back pain: Secondary | ICD-10-CM | POA: Diagnosis not present

## 2021-06-07 DIAGNOSIS — M5459 Other low back pain: Secondary | ICD-10-CM | POA: Diagnosis not present

## 2021-06-13 ENCOUNTER — Other Ambulatory Visit: Payer: Self-pay

## 2021-06-13 ENCOUNTER — Ambulatory Visit (INDEPENDENT_AMBULATORY_CARE_PROVIDER_SITE_OTHER): Payer: Medicare Other | Admitting: Psychiatry

## 2021-06-13 ENCOUNTER — Encounter: Payer: Self-pay | Admitting: Psychiatry

## 2021-06-13 DIAGNOSIS — F419 Anxiety disorder, unspecified: Secondary | ICD-10-CM | POA: Diagnosis not present

## 2021-06-13 DIAGNOSIS — F320 Major depressive disorder, single episode, mild: Secondary | ICD-10-CM | POA: Diagnosis not present

## 2021-06-13 MED ORDER — VILAZODONE HCL 40 MG PO TABS
ORAL_TABLET | ORAL | 0 refills | Status: DC
Start: 2021-06-13 — End: 2021-07-13

## 2021-06-13 NOTE — Progress Notes (Signed)
Andrea Burke:[id number] Feb 14, 1946 75 y.o.  Subjective:   Patient ID:  Andrea Burke is a 75 y.o. (DOB 1946-10-07) female.  Chief Complaint:  Chief Complaint  Patient presents with   Depression   Anxiety     HPI Andrea Burke presents to the office today for follow-up of anxiety, depression, and insomnia. She notices some weight gain and increased joint pain. She reports that she decided not to decrease Viibryd after last visit. She reports that she was having some distressing dreams at that time, to include people from her past and that are now deceased. She reports that distressing dreams have improved. She reports continued anxiety. She reports some worry and rumination. She reports that rumination is slightly less compared to the past. She reports a "little bit" of persistent depression. Very slight irritation. Energy and motivation have been low. Reports "putting things off." Denies diminished interest in things. She reports concentration is fair and difficult for things that she is not as interested in. She reports gaining 10-12 lbs and reports that food intake does not seem to be consistent with this. Denies any difficulty falling or staying asleep. Denies SI.   She reports that she would like to attempt dose reduction of Viibryd due to concerns about weight gain.   Has not been able to see grandchildren recently due to them having summer plans. They are going on a cruise in November. She reports that she has worries about her husband traveling with multiple medical conditions. She was hoping that they could get another dog but husband is wanting to travel. Has contemplated volunteering at an [employer].   Planning a cruise in mid-November.  Continues to grieve the loss of her dog.   She reports minimal psychosocial support. Friends have not been gathering since the start of the pandemic.   Past medication trials: Remeron- vivid dreams, increased  appetite Lexapro-nightmares Sertraline-agitation, insomnia, impaired concentration Paxil-intolerable side effects Fluoxetine-intolerable side effects Viibryd- Partial improvement on 20 mg. Does not want to take doses above 20 mg. Wellbutrin- Severe tremors Lithium Hydroxyzine Xanax [place] Office Visit from 11/18/2020 in [place] Office Visit from 04/25/2020 in [facility]  Total Score (max 30 points ) 29 29        Review of Systems:  Review of Systems  Musculoskeletal:  Positive for arthralgias and back pain. Negative for gait problem.  Neurological:  Negative for tremors.  Psychiatric/Behavioral:         Please refer to HPI   Has started PT for back pain  Medications: I have reviewed the patient's current medications.  Current Outpatient Medications  Medication Sig Dispense Refill   Calcium Carbonate-Vitamin D (CALTRATE 600+D PO) Take by mouth.     cyclobenzaprine (FLEXERIL) 5 MG tablet Take by mouth.     finasteride (PROSCAR) 5 MG tablet Take 2.5 mg by mouth daily.     fish oil-omega-3 fatty acids 1000 MG capsule Take 2 g by mouth daily.     hydroxychloroquine (PLAQUENIL) 200 MG tablet Take by mouth daily.     Multiple Vitamins-Minerals (CENTRUM PO) Take by mouth.     BUTALBITAL-ACETAMINOPHEN PO Take by mouth.     Vilazodone HCl (VIIBRYD) 40 MG TABS Take 10 mg daily 90 tablet 0   No current facility-administered medications for this visit.    Medication Side Effects: None  Allergies:  Allergies  Allergen Reactions   Doxycycline Calcium    Penicillins  Past Medical History:  Diagnosis Date   Cancer ([place])    KIDNEY   Discoid lupus    Endometriosis    Headache(784.0)    Insomnia    Kidney disease    Lupus ([place])    Osteopenia    Urinary incontinence     Past Medical History, Surgical history, Social history, and Family history were reviewed and updated as appropriate.   Please see  review of systems for further details on the patient's review from today.   Objective:   Physical Exam:  There were no vitals taken for this visit.  Physical Exam Constitutional:      General: She is not in acute distress. Musculoskeletal:        General: No deformity.  Neurological:     Mental Status: She is alert and oriented to person, place, and time.     Coordination: Coordination normal.  Psychiatric:        Attention and Perception: Attention and perception normal. She does not perceive auditory or visual hallucinations.        Mood and Affect: Mood is anxious and depressed. Affect is not labile, blunt, angry or inappropriate.        Speech: Speech normal.        Behavior: Behavior normal.        Thought Content: Thought content normal. Thought content is not paranoid or delusional. Thought content does not include homicidal or suicidal ideation. Thought content does not include homicidal or suicidal plan.        Cognition and Memory: Cognition and memory normal.        Judgment: Judgment normal.     Comments: Insight intact    Lab Review:     Component Value Date/Time   NA 138 07/31/2014 1222   K 4.2 07/31/2014 1222   CL 107 07/31/2014 1222   CO2 24 07/31/2014 1222   GLUCOSE 88 07/31/2014 1222   BUN 11 07/31/2014 1222   CREATININE 0.72 07/31/2014 1222   CALCIUM 9.5 07/31/2014 1222   PROT 7.5 07/31/2014 1222   ALBUMIN 4.3 07/31/2014 1222   AST 21 07/31/2014 1222   ALT 17 07/31/2014 1222   ALKPHOS 54 07/31/2014 1222   BILITOT 0.6 07/31/2014 1222       Component Value Date/Time   WBC 3.7 (L) 09/29/2018 0951   RBC 4.86 09/29/2018 0951   HGB 14.1 09/29/2018 0951   HCT 41.8 09/29/2018 0951   PLT 183 09/29/2018 0951   MCV 86.0 09/29/2018 0951   MCV 86.6 07/31/2014 1222   MCH 29.0 09/29/2018 0951   MCHC 33.7 09/29/2018 0951   RDW 12.2 09/29/2018 0951   LYMPHSABS 1,887 09/29/2018 0951   EOSABS 122 09/29/2018 0951   BASOSABS 30 09/29/2018 0951    No results  found for: POCLITH, LITHIUM   No results found for: PHENYTOIN, PHENOBARB, VALPROATE, CBMZ   .res Assessment: Plan:   Pt seen for 30 minutes and time seen discussing treatment options and her desire to reduce medication. Discussed alternaives to Viibryd. Discussed potential benefits, risks, and side effects of Cymbalta. Pt reports that she would like to decrease Viibryd to 10 mg po qd and consider Cymbalta in the future.  Will decrease Viibryd to 10 mg po qd for mood and anxiety.  Pt to follow up in 4 weeks or sooner if clinically indicated.  Recommend continuing therapy with Rinaldo Cloud, LCSW.  Patient advised to contact office with any questions, adverse effects, or acute worsening in signs  and symptoms.   Andrea Burke was seen today for depression and anxiety.  Diagnoses and all orders for this visit:  Major depressive disorder, single episode, mild (H[facility]) -     Vilazodone HCl (VIIBRYD) 40 MG TABS; Take 10 mg daily  Anxiety disorder, unspecified type -     Vilazodone HCl (VIIBRYD) 40 MG TABS; Take 10 mg daily    Please see After Visit Summary for patient specific instructions.  Future Appointments  Date Time Provider [place]  06/21/2021 11:00 AM Shanon Ace, LCSW [facility] None  07/13/2021  1:30 PM Thayer Headings, PMHNP [facility] None    No orders of the defined types were placed in this encounter.   -------------------------------

## 2021-06-14 DIAGNOSIS — M5384 Other specified dorsopathies, thoracic region: Secondary | ICD-10-CM | POA: Diagnosis not present

## 2021-06-14 DIAGNOSIS — M545 Low back pain, unspecified: Secondary | ICD-10-CM | POA: Diagnosis not present

## 2021-06-14 DIAGNOSIS — M546 Pain in thoracic spine: Secondary | ICD-10-CM | POA: Diagnosis not present

## 2021-06-14 DIAGNOSIS — M6281 Muscle weakness (generalized): Secondary | ICD-10-CM | POA: Diagnosis not present

## 2021-06-14 DIAGNOSIS — M5386 Other specified dorsopathies, lumbar region: Secondary | ICD-10-CM | POA: Diagnosis not present

## 2021-06-14 DIAGNOSIS — M8580 Other specified disorders of bone density and structure, unspecified site: Secondary | ICD-10-CM | POA: Diagnosis not present

## 2021-06-19 DIAGNOSIS — M8580 Other specified disorders of bone density and structure, unspecified site: Secondary | ICD-10-CM | POA: Diagnosis not present

## 2021-06-19 DIAGNOSIS — M546 Pain in thoracic spine: Secondary | ICD-10-CM | POA: Diagnosis not present

## 2021-06-19 DIAGNOSIS — M5384 Other specified dorsopathies, thoracic region: Secondary | ICD-10-CM | POA: Diagnosis not present

## 2021-06-19 DIAGNOSIS — M5386 Other specified dorsopathies, lumbar region: Secondary | ICD-10-CM | POA: Diagnosis not present

## 2021-06-19 DIAGNOSIS — M545 Low back pain, unspecified: Secondary | ICD-10-CM | POA: Diagnosis not present

## 2021-06-19 DIAGNOSIS — M6281 Muscle weakness (generalized): Secondary | ICD-10-CM | POA: Diagnosis not present

## 2021-06-20 DIAGNOSIS — Z1231 Encounter for screening mammogram for malignant neoplasm of breast: Secondary | ICD-10-CM | POA: Diagnosis not present

## 2021-06-21 ENCOUNTER — Other Ambulatory Visit: Payer: Self-pay

## 2021-06-21 ENCOUNTER — Ambulatory Visit (INDEPENDENT_AMBULATORY_CARE_PROVIDER_SITE_OTHER): Payer: Medicare Other | Admitting: Psychiatry

## 2021-06-21 DIAGNOSIS — F320 Major depressive disorder, single episode, mild: Secondary | ICD-10-CM | POA: Diagnosis not present

## 2021-06-21 NOTE — Progress Notes (Signed)
[facility] Counselor/Therapist Progress Note  Patient ID: Andrea Burke, MRN: [id number],    Date: 06/21/2021  Time Spent: 57 minutes   Treatment Type: Individual Therapy  Reported Symptoms: depression, anxiety, "but I've been some more leveled out more recently"  Mental Status Exam:  Appearance:   Neat     Behavior:  Appropriate, Sharing, and "moderately motivated"  Motor:  Normal  Speech/Language:   Clear and Coherent  Affect:  Appropriate  Mood:  Milder anxiety  Thought process:  goal directed  Thought content:    WNL  Sensory/Perceptual disturbances:    WNL  Orientation:  oriented to person, place, time/date, situation, day of week, month of year, year, and stated date of Sept 14, 2022  Attention:  Fair  Concentration:  Fair  Memory:  [place] of knowledge:   Good  Insight:    Good and Fair  Judgment:   Good and Fair  Impulse Control:  Good   Risk Assessment: Danger to Self:  No Self-injurious Behavior: No Danger to Others: No Duty to Warn:no Physical Aggression / Violence:No  Access to Firearms a concern: No  Gang Involvement:No   Subjective:  Patient in today reporting anxiety and depression but feels "they have both decreased some and leveled out since her last appt."  "Still get stressed and depressed and feels it's caused by not staying on top of my responsibilities at home as I tend to put them off."  Worked on making certain tasks more of a priority and doing them first in order to get those tasks off her list and then be able to not feel "burdened" and look forward to the rest of the day.  Also processed some sadness about some many changes in the country and how violence seems more prevalent.  "Doesn't help my anxiety and depression."  Feels that her staying in contact with family, getting up each morning and getting busy with her day helps her mood. Some tearfulness about "how much the world has changed" but states she tries more not to watch as much  of it on tv nor online, and that usually helps. Still doing some volunteer work at [employer] some of her growth but also some of her struggles as discussed today.   Interventions: Ego-Supportive and Insight-Oriented  Diagnosis:   ICD-10-CM   1. Major depressive disorder, single episode, mild ([place])  F32.0       Treatment goal plan:  Patient not signing tx plan on computer screen due to [place].  Treatment Goals:  Goals will remain on tx plan as patient works on strategies to achieve her goals.  Progress or lack of, will be documented each week in Progress section of Plan. Long-Term goal: (measurable)  Enhance the ability to handle effectively the full variety of life's anxieties. Patient will eventually rate herself as a "3 or under on a 1-10 scale for depression and for anxiety, for a period of at least 2 months."  Strategies:  Patient will work on re-framing thoughts that have been leading her to feel more anxious.  The re-framed thoughts will support more calmness and personal empowerment for patient.     Short Term goal:  Increase understanding of beliefs and messages that produce worry and anxiety. Strategies: Patient will work with therapist to gain more understanding of how anxiety and worry are supported by certain beliefs and messages.  She will be able to give at least 3 examples of such messages in an  upcoming session.     Plan:  Patient today showing what she referred to as "moderate" evaluation and feeling some better than when she was last in our office seeing her med provider and was struggling more with depression at that time per her report.  Today she reports that her depression and anxiety are about "even" and are both at a "a little bit lower level".  Finds that she struggles more and being motivated to do household tasks that need to be done and also in dealing with so many things that happen in the world that are disturbing or sad.  Talk through a different way  of her approaching household task that basically meant prioritizing them and completing them first so as to have the rest of the day to do whatever she wants and not be dreading the household chores.  Encouraged positive behaviors including:  Staying in the present focusing on what she can control.  Having a healthier balance of meaningful and fun time to spend with family/friends. Doing yard work which she says is therapeutic for her. Setting limits on watching distressing news on TV or online. Refrain from assuming worst case scenarios. Stop self negating. Practice consistent positive self talk. Intentionally look for more positives than negatives daily. Search for the positives within herself. Have good contact with people who are supportive of her. And recognize the strength she is showing in working with goal-directed behaviors trying to move forward in a positive direction towards improved emotional health.  Goal review and progress/challenges noted with patient.  Next appointment within approx 4 weeks, working around family travel plans coming up.   Shanon Ace, LCSW

## 2021-06-22 DIAGNOSIS — M5384 Other specified dorsopathies, thoracic region: Secondary | ICD-10-CM | POA: Diagnosis not present

## 2021-06-22 DIAGNOSIS — M546 Pain in thoracic spine: Secondary | ICD-10-CM | POA: Diagnosis not present

## 2021-06-22 DIAGNOSIS — M5386 Other specified dorsopathies, lumbar region: Secondary | ICD-10-CM | POA: Diagnosis not present

## 2021-06-22 DIAGNOSIS — M545 Low back pain, unspecified: Secondary | ICD-10-CM | POA: Diagnosis not present

## 2021-06-22 DIAGNOSIS — M6281 Muscle weakness (generalized): Secondary | ICD-10-CM | POA: Diagnosis not present

## 2021-06-22 DIAGNOSIS — M8580 Other specified disorders of bone density and structure, unspecified site: Secondary | ICD-10-CM | POA: Diagnosis not present

## 2021-06-26 DIAGNOSIS — M546 Pain in thoracic spine: Secondary | ICD-10-CM | POA: Diagnosis not present

## 2021-06-26 DIAGNOSIS — M545 Low back pain, unspecified: Secondary | ICD-10-CM | POA: Diagnosis not present

## 2021-06-26 DIAGNOSIS — M5386 Other specified dorsopathies, lumbar region: Secondary | ICD-10-CM | POA: Diagnosis not present

## 2021-06-26 DIAGNOSIS — M6281 Muscle weakness (generalized): Secondary | ICD-10-CM | POA: Diagnosis not present

## 2021-06-26 DIAGNOSIS — M5384 Other specified dorsopathies, thoracic region: Secondary | ICD-10-CM | POA: Diagnosis not present

## 2021-06-26 DIAGNOSIS — M8580 Other specified disorders of bone density and structure, unspecified site: Secondary | ICD-10-CM | POA: Diagnosis not present

## 2021-07-05 DIAGNOSIS — R531 Weakness: Secondary | ICD-10-CM | POA: Diagnosis not present

## 2021-07-05 DIAGNOSIS — M546 Pain in thoracic spine: Secondary | ICD-10-CM | POA: Diagnosis not present

## 2021-07-05 DIAGNOSIS — M545 Low back pain, unspecified: Secondary | ICD-10-CM | POA: Diagnosis not present

## 2021-07-10 DIAGNOSIS — M545 Low back pain, unspecified: Secondary | ICD-10-CM | POA: Diagnosis not present

## 2021-07-10 DIAGNOSIS — M546 Pain in thoracic spine: Secondary | ICD-10-CM | POA: Diagnosis not present

## 2021-07-10 DIAGNOSIS — R531 Weakness: Secondary | ICD-10-CM | POA: Diagnosis not present

## 2021-07-13 ENCOUNTER — Other Ambulatory Visit: Payer: Self-pay

## 2021-07-13 ENCOUNTER — Ambulatory Visit (INDEPENDENT_AMBULATORY_CARE_PROVIDER_SITE_OTHER): Payer: Medicare Other | Admitting: Psychiatry

## 2021-07-13 ENCOUNTER — Encounter: Payer: Self-pay | Admitting: Psychiatry

## 2021-07-13 DIAGNOSIS — Z79899 Other long term (current) drug therapy: Secondary | ICD-10-CM | POA: Diagnosis not present

## 2021-07-13 DIAGNOSIS — F419 Anxiety disorder, unspecified: Secondary | ICD-10-CM

## 2021-07-13 DIAGNOSIS — L932 Other local lupus erythematosus: Secondary | ICD-10-CM | POA: Diagnosis not present

## 2021-07-13 DIAGNOSIS — B351 Tinea unguium: Secondary | ICD-10-CM | POA: Diagnosis not present

## 2021-07-13 DIAGNOSIS — F320 Major depressive disorder, single episode, mild: Secondary | ICD-10-CM | POA: Diagnosis not present

## 2021-07-13 DIAGNOSIS — L299 Pruritus, unspecified: Secondary | ICD-10-CM | POA: Diagnosis not present

## 2021-07-13 DIAGNOSIS — L821 Other seborrheic keratosis: Secondary | ICD-10-CM | POA: Diagnosis not present

## 2021-07-13 NOTE — Progress Notes (Signed)
Andrea Burke [id number] 03/15/1946 75 y.o.  Subjective:   Patient ID:  Andrea Burke is a 75 y.o. (DOB 02/21/1946) female.  Chief Complaint:  Chief Complaint  Patient presents with   Depression   Anxiety    HPI Andrea Burke presents to the office today for follow-up of depression and anxiety. She reports that decrease in [place] has "gone ok and I would like to go off it." She reports that she continues to have some bad dreams. She reports that it can take an hour before she is able to fall asleep. She awakens around 4-6 am and sleep is fragmented after this. She reports the "bad dreams aren't too bad."   Denies any worsening depression. "I seem to have hit a plateau... I'm not hyper... I'm not happy." Motivation remains low. Energy comes in waves. Denies any worsening anxiety- "as long as family is doing ok" and there are no major stressors. No change in appetite or weight. She reports poor concentration. Denies SI.   Upcoming trip in November and possibly January. She continues to worry he may fall.   Past medication trials: Remeron- vivid dreams, increased appetite Lexapro-nightmares Sertraline-agitation, insomnia, impaired concentration Paxil-intolerable side effects Fluoxetine-intolerable side effects Viibryd- Partial improvement on 20 mg. Does not want to take doses above 20 mg. Wellbutrin- Severe tremors Lithium Hydroxyzine Xanax [place] Office Visit from 11/18/2020 in [place] Office Visit from 04/25/2020 in [facility]  Total Score (max 30 points ) 29 29        Review of Systems:  Review of Systems  Musculoskeletal:  Positive for arthralgias and myalgias. Negative for gait problem.  Neurological:  Positive for tremors.  Psychiatric/Behavioral:         Please refer to HPI   Medications: I have reviewed the patient's current medications.  Current Outpatient Medications  Medication  Sig Dispense Refill   hydroxychloroquine (PLAQUENIL) 200 MG tablet Take by mouth daily.     Multiple Vitamins-Minerals (CENTRUM PO) Take by mouth.     BUTALBITAL-ACETAMINOPHEN PO Take by mouth.     Calcium Carbonate-Vitamin D (CALTRATE 600+D PO) Take by mouth.     cyclobenzaprine (FLEXERIL) 5 MG tablet Take by mouth.     finasteride (PROSCAR) 5 MG tablet Take 2.5 mg by mouth daily.     fish oil-omega-3 fatty acids 1000 MG capsule Take 2 g by mouth daily.     No current facility-administered medications for this visit.    Medication Side Effects: None  Allergies:  Allergies  Allergen Reactions   Doxycycline Calcium    Penicillins     Past Medical History:  Diagnosis Date   Cancer ([place])    KIDNEY   Discoid lupus    Endometriosis    Headache(784.0)    Insomnia    Kidney disease    Lupus ([place])    Osteopenia    Urinary incontinence     Past Medical History, Surgical history, Social history, and Family history were reviewed and updated as appropriate.   Please see review of systems for further details on the patient's review from today.   Objective:   Physical Exam:  There were no vitals taken for this visit.  Physical Exam Constitutional:      General: She is not in acute distress. Musculoskeletal:        General: No deformity.  Neurological:     Mental Status: She is alert and oriented to person, place,  and time.     Coordination: Coordination normal.  Psychiatric:        Attention and Perception: Attention and perception normal. She does not perceive auditory or visual hallucinations.        Mood and Affect: Affect is not labile, blunt, angry or inappropriate.        Speech: Speech normal.        Behavior: Behavior normal.        Thought Content: Thought content normal. Thought content is not paranoid or delusional. Thought content does not include homicidal or suicidal ideation. Thought content does not include homicidal or suicidal plan.        Cognition and  Memory: Cognition and memory normal.        Judgment: Judgment normal.     Comments: Insight intact Mood: mild anxious and depression    Lab Review:     Component Value Date/Time   NA 138 07/31/2014 1222   K 4.2 07/31/2014 1222   CL 107 07/31/2014 1222   CO2 24 07/31/2014 1222   GLUCOSE 88 07/31/2014 1222   BUN 11 07/31/2014 1222   CREATININE 0.72 07/31/2014 1222   CALCIUM 9.5 07/31/2014 1222   PROT 7.5 07/31/2014 1222   ALBUMIN 4.3 07/31/2014 1222   AST 21 07/31/2014 1222   ALT 17 07/31/2014 1222   ALKPHOS 54 07/31/2014 1222   BILITOT 0.6 07/31/2014 1222       Component Value Date/Time   WBC 3.7 (L) 09/29/2018 0951   RBC 4.86 09/29/2018 0951   HGB 14.1 09/29/2018 0951   HCT 41.8 09/29/2018 0951   PLT 183 09/29/2018 0951   MCV 86.0 09/29/2018 0951   MCV 86.6 07/31/2014 1222   MCH 29.0 09/29/2018 0951   MCHC 33.7 09/29/2018 0951   RDW 12.2 09/29/2018 0951   LYMPHSABS 1,887 09/29/2018 0951   EOSABS 122 09/29/2018 0951   BASOSABS 30 09/29/2018 0951    No results found for: POCLITH, LITHIUM   No results found for: PHENYTOIN, PHENOBARB, VALPROATE, CBMZ   .res Assessment: Plan:   Will discontinue Viibryd since she has had limited improvement in mood and anxiety and would like to ideally stop medications if possible. She reports that she may wish to consider a different medication if s/s worsen.  Advised pt to contact office if she experiences worsening anxiety and depression.  Recommend continuing therapy with Rinaldo Cloud, LCSW. Pt to follow-up in 2 months or sooner if clinically indicated.  Patient advised to contact office with any questions, adverse effects, or acute worsening in signs and symptoms.   Andrea Burke was seen today for depression and anxiety.  Diagnoses and all orders for this visit:  Major depressive disorder, single episode, mild (HCC)  Anxiety disorder, unspecified type    Please see After Visit Summary for patient specific  instructions.  Future Appointments  Date Time Provider [place]  07/25/2021  3:00 PM Shanon Ace, LCSW [facility] None  09/12/2021  3:30 PM Thayer Headings, PMHNP [facility] None    No orders of the defined types were placed in this encounter.   -------------------------------

## 2021-07-25 ENCOUNTER — Ambulatory Visit (INDEPENDENT_AMBULATORY_CARE_PROVIDER_SITE_OTHER): Payer: Medicare Other | Admitting: Psychiatry

## 2021-07-25 ENCOUNTER — Other Ambulatory Visit: Payer: Self-pay

## 2021-07-25 DIAGNOSIS — F419 Anxiety disorder, unspecified: Secondary | ICD-10-CM

## 2021-07-25 NOTE — Progress Notes (Signed)
[facility] Counselor/Therapist Progress Note  Patient ID: Andrea Burke, MRN: [id number],    Date: 07/25/2021  Time Spent: 58 minutes   Treatment Type: Individual Therapy  Reported Symptoms:  anxiety, depression  Mental Status Exam:  Appearance:   Well Groomed     Behavior:  Appropriate, Sharing, and some motivation  Motor:  Normal  Speech/Language:   Clear and Coherent  Affect:  Anxious, depressed  Mood:  anxious and depressed  Thought process:  goal directed  Thought content:    Some obsessiveness  Sensory/Perceptual disturbances:    WNL  Orientation:  oriented to person, place, time/date, situation, day of week, month of year, year, and stated date of Oct. 18, 2022  Attention:  Fair  Concentration:  Good and Fair  Memory:  [place] of knowledge:   Good  Insight:    Good and Fair  Judgment:   Good  Impulse Control:  Good   Risk Assessment: Danger to Self:  No Self-injurious Behavior: No Danger to Others: No Duty to Warn:no Physical Aggression / Violence:No  Access to Firearms a concern: No  Gang Involvement:No    Subjective:  Patient in today reporting anxiety and some depression.  Has felt more recently that "I was in a depressed and anxious rut". Hard to not think negatively especially with so many bad things happening in the world. Feeling that she's like her mother in having more aches and pains but her PT recently told her she was actually some better. Processed some of her fears and anxieties which seemed helpful to patient who was more grounded and less teary and genuinely smiling some, feeling "it's relief to talk about all this" and not keep it to herself. Staying in contact with family helps, and doing things around the house helps her.  Trying to not be so self-critical and instead feel better about her efforts and some progress she has been able to make.   Interventions: Solution-Oriented/Positive Psychology, Ego-Supportive, and  Insight-Oriented  Diagnosis:   ICD-10-CM   1. Anxiety disorder, unspecified type  F41.9       Treatment goal plan:  Patient not signing tx plan on computer screen due to [place].  Treatment Goals:  Goals will remain on tx plan as patient works on strategies to achieve her goals.  Progress or lack of, will be documented each week in Progress section of Plan. Long-Term goal: (measurable)  Enhance the ability to handle effectively the full variety of life's anxieties. Patient will eventually rate herself as a "3 or under on a 1-10 scale for depression and for anxiety, for a period of at least 2 months."  Strategies:  Patient will work on re-framing thoughts that have been leading her to feel more anxious.  The re-framed thoughts will support more calmness and personal empowerment for patient.     Short Term goal:  Increase understanding of beliefs and messages that produce worry and anxiety. Strategies: Patient will work with therapist to gain more understanding of how anxiety and worry are supported by certain beliefs and messages.  She will be able to give at least 3 examples of such messages in an upcoming session.   Plan:  Patient today showing some motivation that did increase over the session and participated well in working on her anxiety and depression as noted above.  Not taking meds at this point as discussed with her med provider.  Encouraged patient in practicing positive behaviors that could be helpful to  her including: Refraining from assuming worst case scenarios. Setting limits on watching distressing news on TV or online. Doing yard work which she says is therapeutic for her. Having a healthier balance of meaningful and fun time to spend with family/friends. Staying in the present focusing on what she can control or change. Stop self negating. Intentionally look for more positives than negatives daily. Practice consistent positive self talk. Have good contact with people  who are supportive supportive of her. Search for the positives within herself. Recognize the strength she shows in working with goal-directed behaviors to move in a direction that supports improved emotional health.   Goal review and progress/challenges noted with patient.  Next appointment within 4 weeks.  This record has been created using [employer].  Chart creation errors have been sought, but may not always have been located and corrected.  Such creation errors do not reflect on the standard of medical care provided.   Shanon Ace, LCSW

## 2021-08-04 DIAGNOSIS — H04123 Dry eye syndrome of bilateral lacrimal glands: Secondary | ICD-10-CM | POA: Diagnosis not present

## 2021-08-04 DIAGNOSIS — H524 Presbyopia: Secondary | ICD-10-CM | POA: Diagnosis not present

## 2021-08-04 DIAGNOSIS — Z79899 Other long term (current) drug therapy: Secondary | ICD-10-CM | POA: Diagnosis not present

## 2021-08-04 DIAGNOSIS — H31002 Unspecified chorioretinal scars, left eye: Secondary | ICD-10-CM | POA: Diagnosis not present

## 2021-09-05 DIAGNOSIS — Z79899 Other long term (current) drug therapy: Secondary | ICD-10-CM | POA: Diagnosis not present

## 2021-09-05 DIAGNOSIS — Z780 Asymptomatic menopausal state: Secondary | ICD-10-CM | POA: Diagnosis not present

## 2021-09-05 DIAGNOSIS — Z0000 Encounter for general adult medical examination without abnormal findings: Secondary | ICD-10-CM | POA: Diagnosis not present

## 2021-09-05 DIAGNOSIS — M8580 Other specified disorders of bone density and structure, unspecified site: Secondary | ICD-10-CM | POA: Diagnosis not present

## 2021-09-05 DIAGNOSIS — Z85528 Personal history of other malignant neoplasm of kidney: Secondary | ICD-10-CM | POA: Diagnosis not present

## 2021-09-05 DIAGNOSIS — M62838 Other muscle spasm: Secondary | ICD-10-CM | POA: Diagnosis not present

## 2021-09-05 DIAGNOSIS — E7800 Pure hypercholesterolemia, unspecified: Secondary | ICD-10-CM | POA: Diagnosis not present

## 2021-09-05 DIAGNOSIS — F419 Anxiety disorder, unspecified: Secondary | ICD-10-CM | POA: Diagnosis not present

## 2021-09-05 DIAGNOSIS — M85851 Other specified disorders of bone density and structure, right thigh: Secondary | ICD-10-CM | POA: Diagnosis not present

## 2021-09-05 DIAGNOSIS — F321 Major depressive disorder, single episode, moderate: Secondary | ICD-10-CM | POA: Diagnosis not present

## 2021-09-05 DIAGNOSIS — L932 Other local lupus erythematosus: Secondary | ICD-10-CM | POA: Diagnosis not present

## 2021-09-05 DIAGNOSIS — Z23 Encounter for immunization: Secondary | ICD-10-CM | POA: Diagnosis not present

## 2021-09-05 DIAGNOSIS — M85852 Other specified disorders of bone density and structure, left thigh: Secondary | ICD-10-CM | POA: Diagnosis not present

## 2021-09-05 DIAGNOSIS — R32 Unspecified urinary incontinence: Secondary | ICD-10-CM | POA: Diagnosis not present

## 2021-09-11 ENCOUNTER — Encounter: Payer: Self-pay | Admitting: Psychiatry

## 2021-09-11 ENCOUNTER — Other Ambulatory Visit: Payer: Self-pay

## 2021-09-11 ENCOUNTER — Ambulatory Visit (INDEPENDENT_AMBULATORY_CARE_PROVIDER_SITE_OTHER): Payer: Medicare Other | Admitting: Psychiatry

## 2021-09-11 DIAGNOSIS — F332 Major depressive disorder, recurrent severe without psychotic features: Secondary | ICD-10-CM

## 2021-09-11 DIAGNOSIS — M533 Sacrococcygeal disorders, not elsewhere classified: Secondary | ICD-10-CM | POA: Diagnosis not present

## 2021-09-11 DIAGNOSIS — M47816 Spondylosis without myelopathy or radiculopathy, lumbar region: Secondary | ICD-10-CM | POA: Diagnosis not present

## 2021-09-11 DIAGNOSIS — M4316 Spondylolisthesis, lumbar region: Secondary | ICD-10-CM | POA: Diagnosis not present

## 2021-09-11 DIAGNOSIS — M438X6 Other specified deforming dorsopathies, lumbar region: Secondary | ICD-10-CM | POA: Diagnosis not present

## 2021-09-11 DIAGNOSIS — F419 Anxiety disorder, unspecified: Secondary | ICD-10-CM | POA: Diagnosis not present

## 2021-09-11 DIAGNOSIS — M47817 Spondylosis without myelopathy or radiculopathy, lumbosacral region: Secondary | ICD-10-CM | POA: Diagnosis not present

## 2021-09-11 MED ORDER — DULOXETINE HCL 20 MG PO CPEP: 20.0000 mg | ORAL_CAPSULE | Freq: Every day | ORAL | 1 refills | Status: DC

## 2021-09-11 NOTE — Progress Notes (Signed)
Andrea Burke [id number] March 25, 1946 75 y.o.  Subjective:   Patient ID:  Andrea Burke is a 75 y.o. (DOB [birth year]) female.  Chief Complaint:  Chief Complaint  Patient presents with   Depression   Obesity     HPI Andrea Burke presents to the office today for follow-up of depression and anxiety. She reports that she "thought I was doing ok easing off of it (Viibryd)... but the last 2-3 weeks have just been bummers." She reports that she has not been looking forward to Christmas- "just want the day to come and go." Energy and motivation are low. Reports anxiety is the same. She reports that her appetite has been low and has "no interest in it." Anhedonia. She reports poor concentration and focus. Concerned about memory. Reports difficulty thinking of words and at Thanksgiving had trouble recalling her nephew's name. Denies SI.   Sleep has been ok. She reports that she  continues to have "weird dreams... about people that have passed away." Describes dreams as "long and laborious," ie. Moving out of college and going through all the belongings with roommates.   Went on trip and husband fell on the next to the last day. Husband has planned another trip in January and she has concerns about this.   Past medication trials: Remeron- vivid dreams, increased appetite Lexapro-nightmares Sertraline-agitation, insomnia, impaired concentration Paxil-intolerable side effects Fluoxetine-intolerable side effects Viibryd- Partial improvement on 20 mg. Does not want to take doses above 20 mg. Wellbutrin- Severe tremors Lithium Hydroxyzine Xanax [place] Office Visit from 11/18/2020 in [place] Office Visit from 04/25/2020 in [facility]  Total Score (max 30 points ) 29 29        Review of Systems:  Review of Systems  Gastrointestinal:        Reports GI "upset"  Musculoskeletal:  Positive for arthralgias and  back pain. Negative for gait problem.  Neurological:  Positive for headaches.  Psychiatric/Behavioral:         Please refer to HPI   Medications: I have reviewed the patient's current medications.  Current Outpatient Medications  Medication Sig Dispense Refill   BUTALBITAL-ACETAMINOPHEN PO Take by mouth.     Calcium Carbonate-Vitamin D (CALTRATE 600+D PO) Take by mouth.     DULoxetine (CYMBALTA) 20 MG capsule Take 1 capsule (20 mg total) by mouth daily. 30 capsule 1   finasteride (PROSCAR) 5 MG tablet Take 2.5 mg by mouth daily.     fish oil-omega-3 fatty acids 1000 MG capsule Take 2 g by mouth daily.     hydroxychloroquine (PLAQUENIL) 200 MG tablet Take by mouth daily.     ibuprofen (ADVIL) 200 MG tablet Take 200 mg by mouth every 6 (six) hours as needed.     Multiple Vitamins-Minerals (CENTRUM PO) Take by mouth.     cyclobenzaprine (FLEXERIL) 5 MG tablet Take by mouth. (Patient not taking: Reported on 09/11/2021)     methocarbamol (ROBAXIN) 500 MG tablet Take 500 mg by mouth 3 (three) times daily.     No current facility-administered medications for this visit.    Medication Side Effects: Other: N/A  Allergies:  Allergies  Allergen Reactions   Doxycycline Calcium    Penicillins     Past Medical History:  Diagnosis Date   Cancer ([place])    KIDNEY   Discoid lupus    Endometriosis    Headache(784.0)    Insomnia    Kidney disease  Lupus ([place])    Osteopenia    Urinary incontinence     Past Medical History, Surgical history, Social history, and Family history were reviewed and updated as appropriate.   Please see review of systems for further details on the patient's review from today.   Objective:   Physical Exam:  There were no vitals taken for this visit.  Physical Exam Constitutional:      General: She is not in acute distress. Musculoskeletal:        General: No deformity.  Neurological:     Mental Status: She is alert and oriented to person, place, and  time.     Coordination: Coordination normal.  Psychiatric:        Attention and Perception: Attention and perception normal. She does not perceive auditory or visual hallucinations.        Mood and Affect: Mood is anxious and depressed. Affect is not labile, blunt, angry or inappropriate.        Speech: Speech normal.        Behavior: Behavior normal.        Thought Content: Thought content normal. Thought content is not paranoid or delusional. Thought content does not include homicidal or suicidal ideation. Thought content does not include homicidal or suicidal plan.        Cognition and Memory: Cognition and memory normal.        Judgment: Judgment normal.     Comments: Insight intact    Lab Review:     Component Value Date/Time   NA 138 07/31/2014 1222   K 4.2 07/31/2014 1222   CL 107 07/31/2014 1222   CO2 24 07/31/2014 1222   GLUCOSE 88 07/31/2014 1222   BUN 11 07/31/2014 1222   CREATININE 0.72 07/31/2014 1222   CALCIUM 9.5 07/31/2014 1222   PROT 7.5 07/31/2014 1222   ALBUMIN 4.3 07/31/2014 1222   AST 21 07/31/2014 1222   ALT 17 07/31/2014 1222   ALKPHOS 54 07/31/2014 1222   BILITOT 0.6 07/31/2014 1222       Component Value Date/Time   WBC 3.7 (L) 09/29/2018 0951   RBC 4.86 09/29/2018 0951   HGB 14.1 09/29/2018 0951   HCT 41.8 09/29/2018 0951   PLT 183 09/29/2018 0951   MCV 86.0 09/29/2018 0951   MCV 86.6 07/31/2014 1222   MCH 29.0 09/29/2018 0951   MCHC 33.7 09/29/2018 0951   RDW 12.2 09/29/2018 0951   LYMPHSABS 1,887 09/29/2018 0951   EOSABS 122 09/29/2018 0951   BASOSABS 30 09/29/2018 0951    No results found for: POCLITH, LITHIUM   No results found for: PHENYTOIN, PHENOBARB, VALPROATE, CBMZ   .res Assessment: Plan:    Pt seen for 30 minutes and time spent discussing that she has been experiencing worsening depression since stopping Viibryd. She reports reluctance to take medication. Encouraged pt to consider either re-starting Viibryd or considering  Cymbalta since this may help with her pain as well as her mood and anxiety. Discussed concerns about her depression since she is now having more severe s/s that are also affecting her appetite, enjoyment in things, etc.  She agrees to trial of Cymbalta 20 mg po qd for depression and anxiety.  Recommend continuing therapy with Rinaldo Cloud, LCSW.  Pt to follow-up in 2 months or sooner if clinically indicated. Provider will follow-up by phone in one month.  Patient advised to contact office with any questions, adverse effects, or acute worsening in signs and symptoms.    Andrea Burke  was seen today for depression and obesity.  Diagnoses and all orders for this visit:  Major depressive disorder, single episode, mild (HCC) -     DULoxetine (CYMBALTA) 20 MG capsule; Take 1 capsule (20 mg total) by mouth daily.  Anxiety disorder, unspecified type -     DULoxetine (CYMBALTA) 20 MG capsule; Take 1 capsule (20 mg total) by mouth daily.    Please see After Visit Summary for patient specific instructions.  Future Appointments  Date Time Provider [place]  09/18/2021 10:00 AM Shanon Ace, LCSW [facility] None  11/09/2021 11:30 AM Thayer Headings, PMHNP [facility] None    No orders of the defined types were placed in this encounter.   -------------------------------

## 2021-09-12 ENCOUNTER — Ambulatory Visit: Payer: Medicare Other | Admitting: Psychiatry

## 2021-09-14 DIAGNOSIS — M549 Dorsalgia, unspecified: Secondary | ICD-10-CM | POA: Diagnosis not present

## 2021-09-14 DIAGNOSIS — M4310 Spondylolisthesis, site unspecified: Secondary | ICD-10-CM | POA: Diagnosis not present

## 2021-09-14 DIAGNOSIS — D84821 Immunodeficiency due to drugs: Secondary | ICD-10-CM | POA: Diagnosis not present

## 2021-09-14 DIAGNOSIS — M47816 Spondylosis without myelopathy or radiculopathy, lumbar region: Secondary | ICD-10-CM | POA: Diagnosis not present

## 2021-09-14 DIAGNOSIS — Z79899 Other long term (current) drug therapy: Secondary | ICD-10-CM | POA: Diagnosis not present

## 2021-09-14 DIAGNOSIS — K5900 Constipation, unspecified: Secondary | ICD-10-CM | POA: Diagnosis not present

## 2021-09-18 ENCOUNTER — Ambulatory Visit (INDEPENDENT_AMBULATORY_CARE_PROVIDER_SITE_OTHER): Payer: Medicare Other | Admitting: Psychiatry

## 2021-09-18 ENCOUNTER — Other Ambulatory Visit: Payer: Self-pay

## 2021-09-18 DIAGNOSIS — F332 Major depressive disorder, recurrent severe without psychotic features: Secondary | ICD-10-CM | POA: Diagnosis not present

## 2021-09-18 NOTE — Progress Notes (Signed)
[facility] Counselor/Therapist Progress Note  Patient ID: Andrea Burke, MRN: [id number],    Date: 09/18/2021  Time Spent: 58 minutes   Treatment Type: Individual Therapy  Reported Symptoms: depression, some anxiety, "my depression is a little bit worse"  Mental Status Exam:  Appearance:   Neat     Behavior:  Appropriate, Sharing, and Motivated  Motor:  Normal and normal except back pain is causing her to be more careful in moving about.  Speech/Language:   Clear and Coherent  Affect:  Depressed and some anxiousness  Mood:  depressed  Thought process:  normal  Thought content:    WNL  Sensory/Perceptual disturbances:    WNL  Orientation:  oriented to person, place, time/date, situation, day of week, month of year, year, and stated date of Dec. 12, 2022  Attention:  Good  Concentration:  Fair  Memory:  Some short term memory issues  Fund of knowledge:   Good  Insight:    Good and Fair  Judgment:   Good  Impulse Control:  Good   Risk Assessment: Danger to Self:  No Self-injurious Behavior: No Danger to Others: No Duty to Warn:no Physical Aggression / Violence:No  Access to Firearms a concern: No  Gang Involvement:No   Subjective:  Patient in today reporting depression as main symptom and some anxiety. Admits later that "I'm still in a depressive rut"and haven't been taking the newer medication long enough to know if it's going to help." Does realize her back pain she's having is supporting her depression and some anxiety as she can't get about like she normally would without the back pain. Is careful in moving about and "hold onto things like rails etc" as needed. Hoping to take a week of vacation in January if she's able to travel with her back. Feelings of frustration, uncertainty, and some sadness right how because her back pain is limiting her some---processed the sadness she's feeling and that seemed to be helpful "as I don't talk to other people much about my  sadness." Denies any SI.  Interventions: Ego-Supportive and Insight-Oriented  Treatment goal plan:  Patient not signing tx plan on computer screen due to [place].  Treatment Goals:  Goals will remain on tx plan as patient works on strategies to achieve her goals.  Progress or lack of, will be documented each week in Progress section of Plan. Long-Term goal: (measurable)  Enhance the ability to handle effectively the full variety of life's anxieties. Patient will eventually rate herself as a "3 or under on a 1-10 scale for depression and for anxiety, for a period of at least 2 months."  Strategies:  Patient will work on re-framing thoughts that have been leading her to feel more anxious.  The re-framed thoughts will support more calmness and personal empowerment for patient.     Short Term goal:  Increase understanding of beliefs and messages that produce worry and anxiety. Strategies: Patient will work with therapist to gain more understanding of how anxiety and worry are supported by certain beliefs and messages.  She will be able to give at least 3 examples of such messages in an upcoming session.   Diagnosis:   ICD-10-CM   1. Severe episode of recurrent major depressive disorder, without psychotic features ([place])  F33.2       Plan:  Patient today showing some motivation and it did seem to improve a little as we talked throughout the session in which she participated well.  Worked  more on her depressive feelings, with some focus on her anxiety, but more so on the depression (per treatment goal plan), as it seemed to be stronger than when I had seen her previously.  Denies any SI.   She did respond well during session and shared that her back pain was aggravating some of her depressed feelings and is hoping they will still will be able to take a week of vacation next month.  Encouraged patient and her practice of positive behaviors including:  Believing more in herself and her ability to make  changes. Practice more consistent positive self talk. Refrain from assuming worst case scenarios. Staying in the present focusing on what she can control or change. Setting limits on watching distressing news on TV or online. Have a healthier balance of meaningful and fun time to spend with family/friends. Stop self negating. Intentionally look for more positives versus negatives daily. Search for the positives within herself. Maintain contact with people who are supportive of her. Stay on her prescribed medication. Feel good about the strength she shows in working with goal-directed behaviors to move in a direction that supports improved emotional health and overall wellbeing.   Goal review and progress/challenges noted with patient.  Next appointment within 3 to 4 weeks.  This record has been created using [employer].  Chart creation errors have been sought, but may not always have been located and corrected.  Such creation errors do not reflect on the standard of medical care provided.   Shanon Ace, LCSW

## 2021-09-19 DIAGNOSIS — M549 Dorsalgia, unspecified: Secondary | ICD-10-CM | POA: Diagnosis not present

## 2021-09-19 DIAGNOSIS — K5900 Constipation, unspecified: Secondary | ICD-10-CM | POA: Diagnosis not present

## 2021-09-26 DIAGNOSIS — M549 Dorsalgia, unspecified: Secondary | ICD-10-CM | POA: Diagnosis not present

## 2021-09-26 DIAGNOSIS — K5900 Constipation, unspecified: Secondary | ICD-10-CM | POA: Diagnosis not present

## 2021-10-12 ENCOUNTER — Telehealth: Payer: Self-pay | Admitting: Psychiatry

## 2021-10-12 NOTE — Telephone Encounter (Signed)
Called pt to follow-up re: response to Duloxetine. She reports, "I think it is going ok now." She reports that she was prescribed several meds from her PCP around the same time for her pain and may have had side effects with these. She reports that about a week later "the spasms stopped coming... and not down to aches and pains." Seeing PT next week before her trip. She reports, "I got through Christmas ok" and reports that her mood may be better. She would like to stay at this dose since she has seen some improvement. Will re-evaluate dose at next visit.

## 2021-10-12 NOTE — Telephone Encounter (Deleted)
-----   Message from [place] sent at 09/11/2021  2:15 PM EST ----- Check on response to Cymbalta 20 mg.

## 2021-10-17 DIAGNOSIS — R293 Abnormal posture: Secondary | ICD-10-CM | POA: Diagnosis not present

## 2021-10-17 DIAGNOSIS — R2689 Other abnormalities of gait and mobility: Secondary | ICD-10-CM | POA: Diagnosis not present

## 2021-10-17 DIAGNOSIS — M6281 Muscle weakness (generalized): Secondary | ICD-10-CM | POA: Diagnosis not present

## 2021-10-17 DIAGNOSIS — M549 Dorsalgia, unspecified: Secondary | ICD-10-CM | POA: Diagnosis not present

## 2021-10-17 DIAGNOSIS — M546 Pain in thoracic spine: Secondary | ICD-10-CM | POA: Diagnosis not present

## 2021-10-20 ENCOUNTER — Ambulatory Visit: Payer: Medicare Other | Admitting: Psychiatry

## 2021-11-03 ENCOUNTER — Ambulatory Visit: Payer: Medicare Other | Admitting: Psychiatry

## 2021-11-03 ENCOUNTER — Telehealth: Payer: Self-pay | Admitting: Psychiatry

## 2021-11-03 NOTE — Telephone Encounter (Signed)
Pt LVM asking if she can take Mucinex with the Duloxetine that she is on.   Next appt 2/2

## 2021-11-03 NOTE — Telephone Encounter (Signed)
Please review

## 2021-11-03 NOTE — Telephone Encounter (Signed)
Please let her know that she is fine to take Mucinex or any other cold medication with Cymbalta.

## 2021-11-03 NOTE — Telephone Encounter (Signed)
Pt informed

## 2021-11-07 DIAGNOSIS — M549 Dorsalgia, unspecified: Secondary | ICD-10-CM | POA: Diagnosis not present

## 2021-11-07 DIAGNOSIS — Z23 Encounter for immunization: Secondary | ICD-10-CM | POA: Diagnosis not present

## 2021-11-07 DIAGNOSIS — K5900 Constipation, unspecified: Secondary | ICD-10-CM | POA: Diagnosis not present

## 2021-11-08 DIAGNOSIS — Z79899 Other long term (current) drug therapy: Secondary | ICD-10-CM | POA: Diagnosis not present

## 2021-11-09 ENCOUNTER — Other Ambulatory Visit: Payer: Self-pay

## 2021-11-09 ENCOUNTER — Ambulatory Visit (INDEPENDENT_AMBULATORY_CARE_PROVIDER_SITE_OTHER): Payer: Medicare Other | Admitting: Psychiatry

## 2021-11-09 ENCOUNTER — Encounter: Payer: Self-pay | Admitting: Psychiatry

## 2021-11-09 DIAGNOSIS — F419 Anxiety disorder, unspecified: Secondary | ICD-10-CM | POA: Diagnosis not present

## 2021-11-09 DIAGNOSIS — F331 Major depressive disorder, recurrent, moderate: Secondary | ICD-10-CM | POA: Diagnosis not present

## 2021-11-09 MED ORDER — DULOXETINE HCL 30 MG PO CPEP: 30.0000 mg | ORAL_CAPSULE | Freq: Every day | ORAL | 1 refills | Status: DC

## 2021-11-09 NOTE — Progress Notes (Signed)
Andrea Burke [id number] Nov 02, 1945 76 y.o.  Subjective:   Patient ID:  Andrea Burke is a 76 y.o. (DOB [birth year]) female.  Chief Complaint:  Chief Complaint  Patient presents with   Depression   Anxiety    HPI Andrea Burke presents to the office today for follow-up of depression and anxiety. She reports, "I'm not in a funk each day." She reports "some days just can't get going at all." She has been trying to get things cleaned up and put away at home and is not making as much progress as she would like, but has been somewhat more productive. She reports that she has difficulty getting started with things. She reports that she continues to have anxiety. She reports that she was "on edge" during their trip and worried husband may fall or something go wrong. She reports that she is unsure of Duloxetine has helped anxiety. She reports that she is sleeping better with some occasional difficulty falling asleep. She reports that she has been having less "weird dreams" and less memories of the dreams. Appetite is low. She reports that her concentration "may be a step better." Feels she is able to accomplish things once she is able to get started. Some difficulty remembering things at times. Limited enjoyment in things. Denies SI.   She reports that her trip went well. Returned last week from her trip. Planning a short trip in August with grandchildren (10, 26, 65, and 75 yo).   Niece has brain cancer and is having surgery mid-February. Her mother is turning 43 and they are trying to organize a birthday party for her in late March. Mother wants to invite 100 people.   Past medication trials: Remeron- vivid dreams, increased appetite Lexapro-nightmares Sertraline-agitation, insomnia, impaired concentration Paxil-intolerable side effects Fluoxetine-intolerable side effects Viibryd- Partial improvement on 20 mg. Does not want to take doses above 20 mg. Wellbutrin- Severe  tremors Lithium Hydroxyzine Xanax Deplin Gabapentin- prescribed for pain    Mini-Mental    Flowsheet Row Office Visit from 11/18/2020 in [place] Visit from 04/25/2020 in [facility]  Total Score (max 30 points ) 29 29        Review of Systems:  Review of Systems  Musculoskeletal:  Positive for arthralgias. Negative for back pain and gait problem.       Some back stiffness  Neurological:  Positive for tremors.  Psychiatric/Behavioral:         Please refer to HPI   Medications: I have reviewed the patient's current medications.  Current Outpatient Medications  Medication Sig Dispense Refill   Calcium Carbonate-Vitamin D (CALTRATE 600+D PO) Take by mouth.     docusate sodium (COLACE) 100 MG capsule Take 100 mg by mouth daily as needed.     finasteride (PROSCAR) 5 MG tablet Take 2.5 mg by mouth daily.     fish oil-omega-3 fatty acids 1000 MG capsule Take 2 g by mouth daily.     hydroxychloroquine (PLAQUENIL) 200 MG tablet Take by mouth daily.     ibuprofen (ADVIL) 200 MG tablet Take 200 mg by mouth every 6 (six) hours as needed.     Multiple Vitamins-Minerals (CENTRUM PO) Take by mouth.     BUTALBITAL-ACETAMINOPHEN PO Take by mouth.     DULoxetine (CYMBALTA) 30 MG capsule Take 1 capsule (30 mg total) by mouth daily. 30 capsule 1   No current facility-administered medications for this visit.    Medication Side Effects: Other: Constipation  Allergies:  Allergies  Allergen Reactions   Doxycycline Calcium    Penicillins     Past Medical History:  Diagnosis Date   Cancer ([place])    KIDNEY   Discoid lupus    Endometriosis    Headache(784.0)    Insomnia    Kidney disease    Lupus ([place])    Osteopenia    Urinary incontinence     Past Medical History, Surgical history, Social history, and Family history were reviewed and updated as appropriate.   Please see review of systems for further details on the patient's review from  today.   Objective:   Physical Exam:  There were no vitals taken for this visit.  Physical Exam Constitutional:      General: She is not in acute distress. Musculoskeletal:        General: No deformity.  Neurological:     Mental Status: She is alert and oriented to person, place, and time.     Coordination: Coordination normal.  Psychiatric:        Attention and Perception: Attention and perception normal. She does not perceive auditory or visual hallucinations.        Mood and Affect: Mood is anxious and depressed. Affect is not labile, blunt, angry or inappropriate.        Speech: Speech normal.        Behavior: Behavior normal.        Thought Content: Thought content normal. Thought content is not paranoid or delusional. Thought content does not include homicidal or suicidal ideation. Thought content does not include homicidal or suicidal plan.        Cognition and Memory: Cognition and memory normal.        Judgment: Judgment normal.     Comments: Insight intact    Lab Review:     Component Value Date/Time   NA 138 07/31/2014 1222   K 4.2 07/31/2014 1222   CL 107 07/31/2014 1222   CO2 24 07/31/2014 1222   GLUCOSE 88 07/31/2014 1222   BUN 11 07/31/2014 1222   CREATININE 0.72 07/31/2014 1222   CALCIUM 9.5 07/31/2014 1222   PROT 7.5 07/31/2014 1222   ALBUMIN 4.3 07/31/2014 1222   AST 21 07/31/2014 1222   ALT 17 07/31/2014 1222   ALKPHOS 54 07/31/2014 1222   BILITOT 0.6 07/31/2014 1222       Component Value Date/Time   WBC 3.7 (L) 09/29/2018 0951   RBC 4.86 09/29/2018 0951   HGB 14.1 09/29/2018 0951   HCT 41.8 09/29/2018 0951   PLT 183 09/29/2018 0951   MCV 86.0 09/29/2018 0951   MCV 86.6 07/31/2014 1222   MCH 29.0 09/29/2018 0951   MCHC 33.7 09/29/2018 0951   RDW 12.2 09/29/2018 0951   LYMPHSABS 1,887 09/29/2018 0951   EOSABS 122 09/29/2018 0951   BASOSABS 30 09/29/2018 0951    No results found for: POCLITH, LITHIUM   No results found for: PHENYTOIN,  PHENOBARB, VALPROATE, CBMZ   .res Assessment: Plan:   Pt seen for 30 minutes and time spent discussing potential benefits, risks, and side effects of increasing Cymbalta to 30 mg po qd to further improve mood and anxiety since she has seen a partial improvement in depressive s/s with 20 mg dose. Pt agrees to increase in Cymbalta to 30 mg daily.  Recommend continuing therapy with Rinaldo Cloud, LCSW.  Pt to follow-up in 4-6 weeks or sooner if clinically indicated.  Patient advised to contact office with any questions, adverse effects,  or acute worsening in signs and symptoms.  Mekesha was seen today for depression and anxiety.  Diagnoses and all orders for this visit:  Moderate episode of recurrent major depressive disorder (HCC) -     DULoxetine (CYMBALTA) 30 MG capsule; Take 1 capsule (30 mg total) by mouth daily.  Anxiety disorder, unspecified type -     DULoxetine (CYMBALTA) 30 MG capsule; Take 1 capsule (30 mg total) by mouth daily.     Please see After Visit Summary for patient specific instructions.  Future Appointments  Date Time Provider [place]  11/10/2021 10:00 AM Shanon Ace, LCSW [facility] None  12/20/2021 11:00 AM Thayer Headings, PMHNP [facility] None    No orders of the defined types were placed in this encounter.   -------------------------------

## 2021-11-10 ENCOUNTER — Ambulatory Visit (INDEPENDENT_AMBULATORY_CARE_PROVIDER_SITE_OTHER): Payer: Medicare Other | Admitting: Psychiatry

## 2021-11-10 ENCOUNTER — Ambulatory Visit: Payer: Medicare Other | Admitting: Psychiatry

## 2021-11-10 DIAGNOSIS — F331 Major depressive disorder, recurrent, moderate: Secondary | ICD-10-CM | POA: Diagnosis not present

## 2021-11-10 NOTE — Progress Notes (Signed)
[facility] Counselor/Therapist Progress Note  Patient ID: Andrea Burke, MRN: [id number],    Date: 11/10/2021  Time Spent: 50 minutes   Treatment Type: Individual Therapy  Reported Symptoms: depression (some improvement), back pain has "almost gone and that has helped"  Mental Status Exam:  Appearance:   Neat     Behavior:  Appropriate, Sharing, and showing some motivation  Motor:  Normal  Speech/Language:   Clear and Coherent  Affect:  Some depression  Mood:  depressed (but improved some)  Thought process:  goal directed  Thought content:    WNL  Sensory/Perceptual disturbances:    WNL  Orientation:  oriented to person, place, time/date, situation, day of week, month of year, year, and stated date of Feb. 3, 2023  Attention:  Good  Concentration:  Good  Memory:  "Not significant but sometimes short term memory issues of not thinking of names or words.  Fund of knowledge:   Good  Insight:    Good  Judgment:   Good  Impulse Control:  Good   Risk Assessment: Danger to Self:  No Self-injurious Behavior: No Danger to Others: No Duty to Warn:no Physical Aggression / Violence:No  Access to Firearms a concern: No  Gang Involvement:No   Subjective:  Patient in today reporting depression, some tearfulness mostly regarding husband's health which he tends to not talk about. Also sharing her anxiety and some depressed feelings about other relatives particularly a niece and her health concerns including brain surgery within the next 2 weeks.  Was able to process her concerns about her niece and also her husband and their future together as they age.  Does have some family plans lined up soon to celebrate her mother's 92nd birthday and also some other family gatherings which she is looking forward to.  States that she is having some milder anxiety and depression but is definitely not "in a rut like I was before".  Does notice some minor physical changes in her husband and  sometimes in herself which she feels is normal aging but is also grateful that they can still get out and travel some and have some good times with family members that live out of town.  Not as "worried" today overall even though she did more freely share some emotions in reference to things in the future that can create some stress and anxiety.  Denies any SI.  Interventions: Solution-Oriented/Positive Psychology, Ego-Supportive, and Insight-Oriented  Treatment Goals:  Goals will remain on tx plan as patient works on strategies to achieve her goals.  Progress or lack of, will be documented each week in Progress section of Plan. Long-Term goal: (measurable)  Enhance the ability to handle effectively the full variety of life's anxieties. Patient will eventually rate herself as a "3 or under on a 1-10 scale for depression and for anxiety, for a period of at least 2 months."  Strategies:  Patient will work on re-framing thoughts that have been leading her to feel more anxious.  The re-framed thoughts will support more calmness and personal empowerment for patient.     Short Term goal:  Increase understanding of beliefs and messages that produce worry and anxiety. Strategies: Patient will work with therapist to gain more understanding of how anxiety and worry are supported by certain beliefs and messages.  She will be able to give at least 3 examples of such messages in an upcoming session.  Diagnosis:   ICD-10-CM   1. Moderate episode of recurrent  major depressive disorder ([place])  F33.1      Plan: Patient today showing motivation and good participation in session, sharing the anxiety she's had since last appt, "but nothing bad happened" so feeling relieved. State her depression and worrying have both decreased some, and "my anxiety isn't as bad either". Has worried some about the traveling and relieved they are back. Expressed some concerns about husband's physical and emotional health, and he has no  immediate family other than me. Husband does talk to her some about his concerns but it tends to be very limited. Denies any SI. Patient had been struggling recently with back pain and reports today that has much improved but still feels some pain when sitting for long periods of time or lifting with her back. Worries with some tearfulness in session about some "long term" about husband's health issues but husband doesn't talk about them.    Encouraged patient in her practice of positive behaviors including: Recognizing progress and reflecting on it more often, for every negative thought create 2 positives, believing more in herself and her ability to make changes, practice more consistent positive self talk, refrain from assuming worst case scenarios, staying in the present focusing on what she can control or change, setting limits on watching distressing news on TV or online, have a healthier balance of meaningful and fun time to spend with family/friends, stop self negating, intentionally look for more positives versus negatives daily, search for the positives within herself and be able to name them, maintain contact with people who are supportive of her, stay on her prescribed medication, and recognize the strength she shows working with goal-directed behaviors to move in a direction that supports improved emotional health.  Goal review and progress/challenges noted with patient.  Next appointment within 4 weeks.  This record has been created using [employer].  Chart creation errors have been sought, but may not always have been located and corrected.  Such creation errors do not reflect on the standard of medical care provided.  Shanon Ace, LCSW

## 2021-11-14 DIAGNOSIS — M546 Pain in thoracic spine: Secondary | ICD-10-CM | POA: Diagnosis not present

## 2021-11-14 DIAGNOSIS — M545 Low back pain, unspecified: Secondary | ICD-10-CM | POA: Diagnosis not present

## 2021-11-14 DIAGNOSIS — M6281 Muscle weakness (generalized): Secondary | ICD-10-CM | POA: Diagnosis not present

## 2021-11-14 DIAGNOSIS — R6889 Other general symptoms and signs: Secondary | ICD-10-CM | POA: Diagnosis not present

## 2021-12-06 ENCOUNTER — Ambulatory Visit (INDEPENDENT_AMBULATORY_CARE_PROVIDER_SITE_OTHER): Payer: Medicare Other | Admitting: Psychiatry

## 2021-12-06 ENCOUNTER — Other Ambulatory Visit: Payer: Self-pay

## 2021-12-06 DIAGNOSIS — F331 Major depressive disorder, recurrent, moderate: Secondary | ICD-10-CM | POA: Diagnosis not present

## 2021-12-06 NOTE — Progress Notes (Signed)
?    [facility] Counselor/Therapist Progress Note ? ?Patient ID: Andrea Burke, MRN: [id number],   ? ?Date: 12/06/2021 ? ?Time Spent: 55 minutes  ? ?Treatment Type: Individual Therapy ? ?Reported Symptoms: depression ("some better"), anxiety ? ?Mental Status Exam: ? ?Appearance:   Neat     ?Behavior:  Appropriate, Sharing, and Motivated  ?Motor:  Normal  ?Speech/Language:   Clear and Coherent  ?Affect:  Depressed and anxious  ?Mood:  anxious and depressed  ?Thought process:  goal directed  ?Thought content:    overthinking  ?Sensory/Perceptual disturbances:    WNL  ?Orientation:  oriented to person, place, time/date, situation, day of week, month of year, year, and stated date of December 06, 2021  ?Attention:  Fair  ?Concentration:  Good and Fair  ?Memory:  WNL  ?Fund of knowledge:   Good  ?Insight:    Good and Fair  ?Judgment:   Good  ?Impulse Control:  Good  ? ?Risk Assessment: ?Danger to Self:  No ?Self-injurious Behavior: No ?Danger to Others: No ?Duty to Warn:no ?Physical Aggression / Violence:No  ?Access to Firearms a concern: No  ?Gang Involvement:No  ? ?Subjective:  Patient in today reporting depression as main symptom "but not quite as bad", also some anxiety that has lessened some and "comes and goes". Processed "things I worry about" and talked some about the difference between worrying and being concerned, where "concerned" doesn't take as much of an emotional toll and also doesn't tent to assume worst case scenarios like worrying often does. Tearfulness decreased. States depression is related to husband's health and things going on in the lives of other family members. "Not sure what starts the depression but it's not worse and maybe some better." Notices that getting outside does help her outlook and talking on phone with friends and family, and also looking or being outside and seeing plants or flowers growing. Has a couple more family trips coming up that she is looking forward to "but always happy  to get home." Had been worried about niece's recent brain surgery and niece did well with that surgery which didn't turn out to be as negative as they had feared, and was managed well. To celebrate mom's 92nd birthday later this month with the family. On a 1-10 scale for depression she self rates as a "5".  On a 1-10 for anxiety, she self rates as a "4" right now but "it does change sometimes". ? ?Interventions: Solution-Oriented/Positive Psychology and Insight-Oriented ? ?Treatment Goals: ? Goals will remain on tx plan as patient works on strategies to achieve her goals.  Progress or lack of, will be documented each week in Progress section of Plan. ?Long-Term goal: (measurable) ? Enhance the ability to handle effectively the full variety of life's anxieties. Patient will eventually rate herself as a "3 or under on a 1-10 scale for depression and for anxiety, for a period of at least 2 months." ? Strategies: ? Patient will work on re-framing thoughts that have been leading her to feel more anxious.  The re-framed thoughts will support more calmness and personal empowerment for patient.     ?Short Term goal: ? Increase understanding of beliefs and messages that produce worry and anxiety. ?Strategies: ?Patient will work with therapist to gain more understanding of how anxiety and worry are supported by certain beliefs and messages.  She will be able to give at least 3 examples of such messages in an upcoming session. ? ?Diagnosis: ?  ICD-10-CM   ?1.  Moderate episode of recurrent major depressive disorder (HCC)  F33.1   ?  ? ?Plan:  Patient today showing motivation and good participation as she worked further on her depression and anxiety, and specifically about some of her worrying she has done recently but also states it is better than it was in the recent past.  Relieved about niece's surgery being over and not going in a negative direction.  Processed some concern about husband's physical and emotional health but  states that she has not been "quite as worried more recently" but still wonders about him at times.  Denies any SI.  States that her own back pain that she has recently experienced has improved and she is starting to get outside more which I supported.  Notices that she does tend to feel better when she has been outside "even if it is just to get a breath of fresh air". Encouraged patient in her practice of positive behaviors including: Believing more in herself and her ability to make changes, recognizing progress and reflecting on it daily, for every negative thought create 2 positives, practice more consistent positive self talk, refrain from assuming worst case scenarios, staying in the present focusing on what she can control or change, setting limits on watching distressing news on TV or online, have a healthier balance of meaningful and fun time to spend with family/friends, stop self negating, intentionally look for more positives versus negatives daily, search for the positives within herself and be able to name them, maintain contact with people who are supportive of her, stay on her prescribed medications, and realize the strengths she shows working with goal-directed behaviors to move in a direction that supports improved emotional health. ? ? ?Goal review and progress/challenges noted with patient. ? ?Next appointment within 3-4 weeks. ? ?This record has been created using [employer].  Chart creation errors have been sought, but may not always have been located and corrected.  Such creation errors do not reflect on the standard of medical care provided. ? ? ?Shanon Ace, LCSW ? ? ? ? ? ? ? ? ? ? ? ? ? ? ? ? ? ? ?

## 2021-12-20 ENCOUNTER — Encounter: Payer: Self-pay | Admitting: Psychiatry

## 2021-12-20 ENCOUNTER — Ambulatory Visit (INDEPENDENT_AMBULATORY_CARE_PROVIDER_SITE_OTHER): Payer: Medicare Other | Admitting: Psychiatry

## 2021-12-20 ENCOUNTER — Other Ambulatory Visit: Payer: Self-pay

## 2021-12-20 DIAGNOSIS — F331 Major depressive disorder, recurrent, moderate: Secondary | ICD-10-CM | POA: Diagnosis not present

## 2021-12-20 DIAGNOSIS — F419 Anxiety disorder, unspecified: Secondary | ICD-10-CM

## 2021-12-20 MED ORDER — DULOXETINE HCL 30 MG PO CPEP: 30.0000 mg | ORAL_CAPSULE | Freq: Every day | ORAL | 1 refills | Status: DC

## 2021-12-20 NOTE — Progress Notes (Signed)
Andrea Burke ?[id number] ?1945-10-31 ?76 y.o. ? ?Subjective:  ? ?Patient ID:  Andrea Burke is a 76 y.o. (DOB Mar 01, 1946) female. ? ?Chief Complaint:  ?Chief Complaint  ?Patient presents with  ? Depression  ? Anxiety  ? ? ?HPI ?Andrea Burke presents to the office today for follow-up of depression and anxiety. She reports, "I still feel better than before I started it." Started Cymbalta 30 mg dose about 2 weeks ago and reports that she continued the 20 mg. She reports that her mood is "generally ok." She reports that she has had some sad moods and that it does not last as long. She reports that she has some anxiety and that it is difficult to distinguish anxiety from depression at times. She reports some worry and "what-if's." She has some anxiety about large upcoming family gathering. She is also anxious about having to speak during event. She reports that she has always had anxiety about public speaking and speaking up in class. She reports that she will feel shaky with public speaking. Denies increased HR or sweating. She reports that her motivation and energy are lower than she would like. She reports that on certain days she notices some improvement in energy and motivation and "it doesn't last." She reports that her sleep is "better than it used to be." She had vivid dreams and yelling out in her sleep for 10 days after the increase in medication and then this resolved. She reports that dreams seem to last a long time and cause her to wake up tired. Concentration has been fair and has not seen a change. She has been trying to do some things around the house. Denies SI.  ? ?She reports that a few mornings she woke up and felt like she was in a "fog... dull headache."  ? ?Past medication trials: ?Remeron- vivid dreams, increased appetite ?Lexapro-nightmares ?Sertraline-agitation, insomnia, impaired concentration ?Paxil-intolerable side effects ?Fluoxetine-intolerable side effects ?Viibryd- Partial  improvement on 20 mg. Does not want to take doses above 20 mg. ?Cymbalta ?Wellbutrin- Severe tremors ?Lithium ?Hydroxyzine ?Xanax ?Deplin ?Gabapentin- prescribed for pain ?  ? ? ?Mini-Mental   ? ?[place] Office Visit from 11/18/2020 in [place] Office Visit from 04/25/2020 in [place]  ?Total Score (max 30 points ) 29 29  ? ?  ?  ? ?Review of Systems:  ?Review of Systems  ?Gastrointestinal:   ?     Had some constipation that has now resolved.  ?Musculoskeletal:  Negative for gait problem.  ?     She reports that her pain was improving and then has had more pain since doing some chores outside.   ?Neurological:  Negative for tremors.  ?Psychiatric/Behavioral:    ?     Please refer to HPI  ? ?Medications: I have reviewed the patient's current medications. ? ?Current Outpatient Medications  ?Medication Sig Dispense Refill  ? BUTALBITAL-ACETAMINOPHEN PO Take by mouth.    ? Calcium Carbonate-Vitamin D (CALTRATE 600+D PO) Take by mouth.    ? docusate sodium (COLACE) 100 MG capsule Take 100 mg by mouth daily as needed.    ? finasteride (PROSCAR) 5 MG tablet Take 2.5 mg by mouth daily.    ? fish oil-omega-3 fatty acids 1000 MG capsule Take 2 g by mouth daily.    ? hydroxychloroquine (PLAQUENIL) 200 MG tablet Take by mouth daily.    ? ibuprofen (ADVIL) 200 MG tablet Take 200 mg by mouth every 6 (six) hours as needed.    ?  Multiple Vitamins-Minerals (CENTRUM PO) Take by mouth.    ? DULoxetine (CYMBALTA) 30 MG capsule Take 1 capsule (30 mg total) by mouth daily. 30 capsule 1  ? ?No current facility-administered medications for this visit.  ? ? ?Medication Side Effects: Other: Possible brief period of vivid dreams. ? ?Allergies:  ?Allergies  ?Allergen Reactions  ? Doxycycline Calcium   ? Penicillins   ? ? ?Past Medical History:  ?Diagnosis Date  ? Cancer [facility])   ? KIDNEY  ? Discoid lupus   ? Endometriosis   ? Headache(784.0)   ? Insomnia   ? Kidney disease   ? Lupus ([place])   ?  Osteopenia   ? Urinary incontinence   ? ? ?Past Medical History, Surgical history, Social history, and Family history were reviewed and updated as appropriate.  ? ?Please see review of systems for further details on the patient's review from today.  ? ?Objective:  ? ?Physical Exam:  ?There were no vitals taken for this visit. ? ?Physical Exam ?Constitutional:   ?   General: She is not in acute distress. ?Musculoskeletal:     ?   General: No deformity.  ?Neurological:  ?   Mental Status: She is alert and oriented to person, place, and time.  ?   Coordination: Coordination normal.  ?Psychiatric:     ?   Attention and Perception: Attention and perception normal. She does not perceive auditory or visual hallucinations.     ?   Mood and Affect: Affect is not labile, blunt, angry or inappropriate.     ?   Speech: Speech normal.     ?   Behavior: Behavior normal.     ?   Thought Content: Thought content normal. Thought content is not paranoid or delusional. Thought content does not include homicidal or suicidal ideation. Thought content does not include homicidal or suicidal plan.     ?   Cognition and Memory: Cognition and memory normal.     ?   Judgment: Judgment normal.  ?   Comments: Insight intact ?Mood presents as less anxious and less depressed   ? ? ?Lab Review:  ?   ?Component Value Date/Time  ? NA 138 07/31/2014 1222  ? K 4.2 07/31/2014 1222  ? CL 107 07/31/2014 1222  ? CO2 24 07/31/2014 1222  ? GLUCOSE 88 07/31/2014 1222  ? BUN 11 07/31/2014 1222  ? CREATININE 0.72 07/31/2014 1222  ? CALCIUM 9.5 07/31/2014 1222  ? PROT 7.5 07/31/2014 1222  ? ALBUMIN 4.3 07/31/2014 1222  ? AST 21 07/31/2014 1222  ? ALT 17 07/31/2014 1222  ? ALKPHOS 54 07/31/2014 1222  ? BILITOT 0.6 07/31/2014 1222  ? ? ?   ?Component Value Date/Time  ? WBC 3.7 (L) 09/29/2018 0951  ? RBC 4.86 09/29/2018 0951  ? HGB 14.1 09/29/2018 0951  ? HCT 41.8 09/29/2018 0951  ? PLT 183 09/29/2018 0951  ? MCV 86.0 09/29/2018 0951  ? MCV 86.6 07/31/2014 1222  ?  MCH 29.0 09/29/2018 0951  ? MCHC 33.7 09/29/2018 0951  ? RDW 12.2 09/29/2018 0951  ? LYMPHSABS 1,887 09/29/2018 0951  ? EOSABS 122 09/29/2018 0951  ? BASOSABS 30 09/29/2018 0951  ? ? ?No results found for: POCLITH, LITHIUM  ? ?No results found for: PHENYTOIN, PHENOBARB, VALPROATE, CBMZ  ? ?.res ?Assessment: Plan:   ?Pt seen for 30 minutes and time spent discussing treatment plan. Discussed that she seems to be tolerating increased dose without difficulty and that she may continue  to experience some improvement from increased dose since she has only taken 30 mg for 2 weeks. She reports that she would like to continue Cymbalta 30 mg daily at this time for mood and anxiety since she reports some partial improvement in mood and anxiety s/s.  ?Recommend continuing therapy with Rinaldo Cloud, LCSW.  ?Pt to follow-up in 6 weeks or sooner if clinically indicated. ?Patient advised to contact office with any questions, adverse effects, or acute worsening in signs and symptoms. ? ? ?Andrea Burke was seen today for depression and anxiety. ? ?Diagnoses and all orders for this visit: ? ?Moderate episode of recurrent major depressive disorder ([place]) ?-     DULoxetine (CYMBALTA) 30 MG capsule; Take 1 capsule (30 mg total) by mouth daily. ? ?Anxiety disorder, unspecified type ?-     DULoxetine (CYMBALTA) 30 MG capsule; Take 1 capsule (30 mg total) by mouth daily. ? ?  ? ?Please see After Visit Summary for patient specific instructions. ? ?Future Appointments  ?Date Time Provider [place]  ?01/04/2022  1:00 PM Shanon Ace, LCSW [facility] None  ?01/31/2022  1:15 PM Thayer Headings, PMHNP [facility] None  ? ? ?No orders of the defined types were placed in this encounter. ? ? ?------------------------------- ?

## 2022-01-04 ENCOUNTER — Ambulatory Visit (INDEPENDENT_AMBULATORY_CARE_PROVIDER_SITE_OTHER): Payer: Medicare Other | Admitting: Psychiatry

## 2022-01-04 DIAGNOSIS — F331 Major depressive disorder, recurrent, moderate: Secondary | ICD-10-CM | POA: Diagnosis not present
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
## 2022-01-31 ENCOUNTER — Encounter: Payer: Self-pay | Admitting: Psychiatry

## 2022-01-31 ENCOUNTER — Ambulatory Visit (INDEPENDENT_AMBULATORY_CARE_PROVIDER_SITE_OTHER): Payer: Medicare Other | Admitting: Psychiatry

## 2022-01-31 DIAGNOSIS — F331 Major depressive disorder, recurrent, moderate: Secondary | ICD-10-CM

## 2022-01-31 DIAGNOSIS — F419 Anxiety disorder, unspecified: Secondary | ICD-10-CM | POA: Diagnosis not present

## 2022-01-31 MED ORDER — DULOXETINE HCL 40 MG PO CPEP: 40.0000 mg | ORAL_CAPSULE | Freq: Every day | ORAL | 2 refills | Status: DC

## 2022-01-31 NOTE — Progress Notes (Signed)
Andrea Burke ?[id number] ?1946-01-12 ?76 y.o. ? ?Subjective:  ? ?Patient ID:  Andrea Burke is a 76 y.o. (DOB 02-Oct-1946) female. ? ?Chief Complaint:  ?Chief Complaint  ?Patient presents with  ? Depression  ? Anxiety  ? ? ?Depression ?       Associated symptoms include myalgias.  Past medical history includes anxiety.   ?Anxiety ? ? ? ?Andrea Burke presents to the office today for follow-up of depression and anxiety. She reports that she  had some anxiety with speaking at her mother's birthday party. She has been thinking about different things since making her speech and that she has thought about things she wishes she had done differently. She reports some worry and denies dwelling on certain anxious thoughts. She reports anxiety is "there but it's not permanent right now." She reports that she will walk away from certain news stories. She reports that her mood is "ok, not exciting." She reports that her mood feels "more level right now."  Energy and motivation have been low. She has been trying to do some things outside. She reports that she will put off organizing papers or reading an article and has difficulty with concentration. Sleep has improved. She reports that she has had a couple of vivid dreams where her husband has told her she was screaming in her sleep. Appetite has been low. Denies eating 3 meals daily and may snack instead for lunch. Enjoys getting outside. Denies SI. She reports that she sometimes questions why she is here.  ? ?She and her husband will take their grandchildren on a short cruise in August.  ? ?Husband has frequent medical appointments and she helps him some with his care. ? ?Father died 12-13 years ago.  ? ?Past medication trials: ?Remeron- vivid dreams, increased appetite ?Lexapro-nightmares ?Sertraline-agitation, insomnia, impaired concentration ?Paxil-intolerable side effects ?Fluoxetine-intolerable side effects ?Viibryd- Partial improvement on 20 mg. Does not want  to take doses above 20 mg. ?Cymbalta ?Wellbutrin- Severe tremors ?Lithium ?Hydroxyzine ?Xanax ?Deplin ?Gabapentin- prescribed for pain ?  ? ?Mini-Mental   ? ?[place] Office Visit from 11/18/2020 in [place] Office Visit from 04/25/2020 in [place]  ?Total Score (max 30 points ) 29 29  ? ?  ?  ? ?Review of Systems:  ?Review of Systems  ?Musculoskeletal:  Positive for arthralgias, back pain and myalgias. Negative for gait problem.  ?Neurological:  Negative for tremors.  ?Psychiatric/Behavioral:  Positive for depression.   ?     Please refer to HPI  ? ?Medications: I have reviewed the patient's current medications. ? ?Current Outpatient Medications  ?Medication Sig Dispense Refill  ? BUTALBITAL-ACETAMINOPHEN PO Take by mouth.    ? Calcium Carbonate-Vitamin D (CALTRATE 600+D PO) Take by mouth.    ? docusate sodium (COLACE) 100 MG capsule Take 100 mg by mouth daily as needed.    ? DULoxetine 40 MG CPEP Take 40 mg by mouth daily. 30 capsule 2  ? finasteride (PROSCAR) 5 MG tablet Take 2.5 mg by mouth daily.    ? fish oil-omega-3 fatty acids 1000 MG capsule Take 2 g by mouth daily.    ? hydroxychloroquine (PLAQUENIL) 200 MG tablet Take by mouth daily.    ? ibuprofen (ADVIL) 200 MG tablet Take 200 mg by mouth every 6 (six) hours as needed.    ? Multiple Vitamins-Minerals (CENTRUM PO) Take by mouth.    ? ?No current facility-administered medications for this visit.  ? ? ?Medication Side Effects: None ? ?  Allergies:  ?Allergies  ?Allergen Reactions  ? Doxycycline Calcium   ? Penicillins   ? ? ?Past Medical History:  ?Diagnosis Date  ? Cancer [facility])   ? KIDNEY  ? Discoid lupus   ? Endometriosis   ? Headache(784.0)   ? Insomnia   ? Kidney disease   ? Lupus ([place])   ? Osteopenia   ? Urinary incontinence   ? ? ?Past Medical History, Surgical history, Social history, and Family history were reviewed and updated as appropriate.  ? ?Please see review of systems for further details on the  patient's review from today.  ? ?Objective:  ? ?Physical Exam:  ?There were no vitals taken for this visit. ? ?Physical Exam ?Constitutional:   ?   General: She is not in acute distress. ?Musculoskeletal:     ?   General: No deformity.  ?Neurological:  ?   Mental Status: She is alert and oriented to person, place, and time.  ?   Coordination: Coordination normal.  ?Psychiatric:     ?   Attention and Perception: Attention and perception normal. She does not perceive auditory or visual hallucinations.     ?   Mood and Affect: Mood is anxious and depressed. Affect is not labile, blunt, angry or inappropriate.     ?   Speech: Speech normal.     ?   Behavior: Behavior normal.     ?   Thought Content: Thought content normal. Thought content is not paranoid or delusional. Thought content does not include homicidal or suicidal ideation. Thought content does not include homicidal or suicidal plan.     ?   Cognition and Memory: Cognition and memory normal.     ?   Judgment: Judgment normal.  ?   Comments: Insight intact  ? ? ?Lab Review:  ?   ?Component Value Date/Time  ? NA 138 07/31/2014 1222  ? K 4.2 07/31/2014 1222  ? CL 107 07/31/2014 1222  ? CO2 24 07/31/2014 1222  ? GLUCOSE 88 07/31/2014 1222  ? BUN 11 07/31/2014 1222  ? CREATININE 0.72 07/31/2014 1222  ? CALCIUM 9.5 07/31/2014 1222  ? PROT 7.5 07/31/2014 1222  ? ALBUMIN 4.3 07/31/2014 1222  ? AST 21 07/31/2014 1222  ? ALT 17 07/31/2014 1222  ? ALKPHOS 54 07/31/2014 1222  ? BILITOT 0.6 07/31/2014 1222  ? ? ?   ?Component Value Date/Time  ? WBC 3.7 (L) 09/29/2018 0951  ? RBC 4.86 09/29/2018 0951  ? HGB 14.1 09/29/2018 0951  ? HCT 41.8 09/29/2018 0951  ? PLT 183 09/29/2018 0951  ? MCV 86.0 09/29/2018 0951  ? MCV 86.6 07/31/2014 1222  ? MCH 29.0 09/29/2018 0951  ? MCHC 33.7 09/29/2018 0951  ? RDW 12.2 09/29/2018 0951  ? LYMPHSABS 1,887 09/29/2018 0951  ? EOSABS 122 09/29/2018 0951  ? BASOSABS 30 09/29/2018 0951  ? ? ?No results found for: POCLITH, LITHIUM  ? ?No results  found for: PHENYTOIN, PHENOBARB, VALPROATE, CBMZ  ? ?.res ?Assessment: Plan:   ?Pt seen for 25 minutes and time spent discussing potential benefits, risks, and side effects of increasing Cymbalta to 40 mg daily to further improve mood and anxiety s/s since she has experienced a partial response with lower dose. Pt agrees to increase in Cymbalta to 40 mg daily for anxiety and depression.  ?Recommend continuing therapy with Rinaldo Cloud, LCSW.  ?Pt to follow-up in 2 months or sooner if clinically indicated.  ?Patient advised to contact office with any questions,  adverse effects, or acute worsening in signs and symptoms. ? ? ?Jet was seen today for depression and anxiety. ? ?Diagnoses and all orders for this visit: ? ?Moderate episode of recurrent major depressive disorder ([place]) ?-     DULoxetine 40 MG CPEP; Take 40 mg by mouth daily. ? ?Anxiety disorder, unspecified type ?-     DULoxetine 40 MG CPEP; Take 40 mg by mouth daily. ? ?  ? ?Please see After Visit Summary for patient specific instructions. ? ?Future Appointments  ?Date Time Provider [place]  ?02/06/2022  3:00 PM Shanon Ace, LCSW [facility] None  ?03/28/2022 11:30 AM Thayer Headings, PMHNP [facility] None  ? ? ?No orders of the defined types were placed in this encounter. ? ? ?------------------------------- ?

## 2022-02-06 ENCOUNTER — Ambulatory Visit: Payer: Medicare Other | Admitting: Psychiatry

## 2022-02-07 ENCOUNTER — Ambulatory Visit (INDEPENDENT_AMBULATORY_CARE_PROVIDER_SITE_OTHER): Payer: Medicare Other | Admitting: Psychiatry

## 2022-02-07 DIAGNOSIS — F331 Major depressive disorder, recurrent, moderate: Secondary | ICD-10-CM

## 2022-02-07 NOTE — Progress Notes (Signed)
?    [facility] Counselor/Therapist Progress Note ? ?Patient ID: Andrea Burke, MRN: [id number],   ? ?Date: 02/07/2022 ? ?Time Spent: 55 minutes  ? ?Treatment Type: Individual Therapy ? ?Reported Symptoms: depression and anxiety "both improving some in small increments" ? ?Mental Status Exam: ? ?Appearance:   Neat     ?Behavior:  Appropriate, Sharing, and Motivated  ?Motor:  Normal  ?Speech/Language:   Clear and Coherent  ?Affect:  Depressed and anxiety  ?Mood:  anxious and depressed  ?Thought process:  goal directed  ?Thought content:    overthinking  ?Sensory/Perceptual disturbances:    WNL  ?Orientation:  oriented to person, place, time/date, situation, day of week, month of year, year, and stated date of Feb 07, 2022  ?Attention:  Fair  ?Concentration:  Fair  ?Memory:  WNL, Later adds that she has occasional short term memory issues and Dr is aware  ?Fund of knowledge:   Good  ?Insight:    Good  ?Judgment:   Good  ?Impulse Control:  Good  ? ?Risk Assessment: ?Danger to Self:  No ?Self-injurious Behavior: No ?Danger to Others: No ?Duty to Warn:no ?Physical Aggression / Violence:No  ?Access to Firearms a concern: No  ?Gang Involvement:No  ? ?Subjective:  Patient in today reporting anxiety, depression and both she reports are improving some very gradually.  Still finds herself still concerned about husband's health and he doesn't talk about it. "When I ask questions to be helpful he either says No or doesn't respond. Still concerned about her" 9 yr old mother, husband's health concerns, world violence never ending" and processed more of her anxious/fearful thoughts and ways of counteracting them and on not getting stuck in them. Shares that she has followed through on suggestions from prior session in not staying so enmeshed in "bad news" and to practice looking more for the positives in each day. States "I think my worrying is not quite as bad as it used to be," and her behavior and demeanor supports that.  More comfortable today being more verbal in session. Not procrastinating quite as much. Smiling more. "Haven't completed any goals but am working on them and following through after first steps." No tearfulness today. Today rates self a "3" for depression and a "3" for anxiety, both reflecting progress.  ? ?Interventions: Solution-Oriented/Positive Psychology, Ego-Supportive, and Insight-Oriented ? ?Treatment Goals: ? Goals will remain on tx plan as patient works on strategies to achieve her goals.  Progress or lack of, will be documented each week in Progress section of Plan. ?Long-Term goal: (measurable) ? Enhance the ability to handle effectively the full variety of life's anxieties. Patient will eventually rate herself as a "3 or under on a 1-10 scale for depression and for anxiety, for a period of at least 2 months." ? Strategies: ? Patient will work on re-framing thoughts that have been leading her to feel more anxious.  The re-framed thoughts will support more calmness and personal empowerment for patient.     ?Short Term goal: ? Increase understanding of beliefs and messages that produce worry and anxiety. ?Strategies: ?Patient will work with therapist to gain more understanding of how anxiety and worry are supported by certain beliefs and messages.  She will be able to give at least 3 examples of such messages in an upcoming session. ?  ?Diagnosis: ?  ICD-10-CM   ?1. Moderate episode of recurrent major depressive disorder (HCC)  F33.1   ?  ? ? ?Plan:   Patient today actively  involved and motivated in session as she worked on her anxiety and depression, looking at what seems to be helping and those things that do not help.  Outlook is improving some gradually.  Following through on suggestions from previous session in regards to not over focusing on "bad news and world violence" and focusing more on daily positives.  Affect is more full and she is smiling more today more motivated and speaking up about  progress she is making including self rating as a "3" for both depression and anxiety reflecting some of her progress. Encouraged patient as she practices more positive behaviors including: Believing more in herself and her ability to make changes and live life more fully in the present, recognizing progress and reflecting on it daily, practice more consistent positive self talk, refraining from assuming worst case scenarios, stay in the present focusing on what she can control or change, setting limits on watching distressing news on TV or online, have a healthier balance of meaningful and fun time to spend with family and friends, stop self negating, intentionally look for more positives versus negatives daily, search for the positives within herself and be able to name them, maintain contact with people who are supportive, remain on her prescribed medications, and realize the strength she shows working with goal-directed behaviors to move in a direction that supports her improved emotional health and overall outlook. ? ?Review and progress/challenges noted with patient. ? ?Next appointment within 4 weeks. ? ?This record has been created using [employer].  Chart creation errors have been sought, but may not always have been located and corrected.  Such creation errors do not reflect on the standard of medical care provided. ? ? ?Shanon Ace, LCSW ? ? ? ? ? ? ? ? ? ? ? ? ? ? ? ? ? ? ?

## 2022-03-02 ENCOUNTER — Telehealth: Payer: Self-pay | Admitting: Psychiatry

## 2022-03-02 DIAGNOSIS — F331 Major depressive disorder, recurrent, moderate: Secondary | ICD-10-CM

## 2022-03-02 DIAGNOSIS — F419 Anxiety disorder, unspecified: Secondary | ICD-10-CM

## 2022-03-02 NOTE — Telephone Encounter (Signed)
Please see message from patient

## 2022-03-02 NOTE — Telephone Encounter (Signed)
Pt lvm at 12:30 pm stating that she wants to go down to 30 mg of the duloxetine. She already has some 30 mg at home and if it is ok to go down she will continue taking them. She said that the 40 mg was too strong,gave her a headache and made her hazy. Please call her and let her know if it is ok to decrease med. Phone number is [phone]

## 2022-03-03 MED ORDER — DULOXETINE HCL 30 MG PO CPEP: 30.0000 mg | ORAL_CAPSULE | Freq: Every day | ORAL | 1 refills | Status: DC

## 2022-03-03 NOTE — Telephone Encounter (Signed)
Please let her know it is ok to resume 30 mg dose. Will also send her a [employer].

## 2022-03-07 ENCOUNTER — Telehealth: Payer: Self-pay | Admitting: Psychiatry

## 2022-03-07 NOTE — Telephone Encounter (Signed)
LVM to rtc 

## 2022-03-07 NOTE — Telephone Encounter (Signed)
LVM with info

## 2022-03-07 NOTE — Telephone Encounter (Signed)
Yes, there was a previous message about this. She may resume 30 mg dose. Her other message indicated she still has 30 mg capsules at home. She may use those or a script can be sent for the 30 mg if needed.

## 2022-03-07 NOTE — Telephone Encounter (Signed)
Pt stated you increased to 40 mg at her last visit.She has been sleeping more and wakes up in a "haze" with a headache.She takes it in the afternoon after dinner

## 2022-03-07 NOTE — Telephone Encounter (Signed)
Pt LVM @ 10:27a.  She wants to know if she can get approval from [facility] to decrease her Duloxetine from '40mg'$  to 30 mg.  Next appt 6/21

## 2022-03-21 ENCOUNTER — Ambulatory Visit (INDEPENDENT_AMBULATORY_CARE_PROVIDER_SITE_OTHER): Payer: Medicare Other | Admitting: Psychiatry

## 2022-03-21 DIAGNOSIS — F331 Major depressive disorder, recurrent, moderate: Secondary | ICD-10-CM

## 2022-03-21 NOTE — Progress Notes (Signed)
[facility] Counselor/Therapist Progress Note  Patient ID: Andrea Burke, MRN: [id number],    Date: 03/21/2022  Time Spent: 55 minutes   Treatment Type: Individual Therapy  Reported Symptoms: depression, anxiety (improved some)  Mental Status Exam:  Appearance:   Well Groomed     Behavior:  Appropriate and Sharing  Motor:  Normal  Speech/Language:   Clear and Coherent  Affect:  Depressed  Mood:  depressed and some anxiety  Thought process:  goal directed  Thought content:    WNL  Sensory/Perceptual disturbances:    WNL  Orientation:  oriented to person, place, time/date, situation, day of week, month of year, year, and stated date of March 21, 2022  Attention:  Fair  Concentration:  Fair  Memory:  Some short term memory issues  Fund of knowledge:   Good  Insight:    Good  Judgment:   Good  Impulse Control:  Good   Risk Assessment: Danger to Self:  No Self-injurious Behavior: No Danger to Others: No Duty to Warn:no Physical Aggression / Violence:No  Access to Firearms a concern: No  Gang Involvement:No   Subjective:  Patient in today reporting anxiety is some better and depression is the stronger symptom "but not everwhelming." Shares that things stressful within the family are concerning to her including her sister's upcoming mastectomy due to cancer, and that sister's daughter is currently dx'd with cancer that has gone to brain. Multiple serious health issues going on within the family.Still concerned with "so much going on in the world", which she did process well in session today and seemed to feel less alone. Shares that her 72 yr mom is a bit stronger recently but it's up and down. States she does get outside and that helps in not getting stuck in her fears and anxiety. Trying to not overly focus on "bad news" that airs on TV or online. Spoke about how she sees herself as progressing some especially in better management of her anxiety versus it managing her.   Some procrastination with task at home.  Is still smiling more and very little tearfulness today.  Interventions: Solution-Oriented/Positive Psychology and Ego-Supportive  Long-Term goal: (measurable)  Enhance the ability to handle effectively the full variety of life's anxieties. Patient will eventually rate herself as a "3 or under on a 1-10 scale for depression and for anxiety, for a period of at least 2 months."  Strategies:  Patient will work on re-framing thoughts that have been leading her to feel more anxious.  The re-framed thoughts will support more calmness and personal empowerment for patient.     Short Term goal:  Increase understanding of beliefs and messages that produce worry and anxiety. Strategies: Patient will work with therapist to gain more understanding of how anxiety and worry are supported by certain beliefs and messages.  She will be able to give at least 3 examples of such messages in an upcoming session.  Diagnosis:   ICD-10-CM   1. Moderate episode of recurrent major depressive disorder ([place])  F33.1      Plan:  Patient today showing motivation and good participation in session as we focused on her depression which she reports has been her stronger symptom most recently although not as depressed as she has been on some occasions in the past.  Currently depression seems focused some on husband's health but also on her 53 year old mom and concerned about her future as well as some other family members who are facing  some serious health issues involving a lot of uncertainties, cancer, and upcoming surgery.  Noticing her progress and is to continue with goal-directed behaviors especially in continuing the work to decrease her worrying and anxiety and reframing her anxious thoughts.  Affect is still more full today and smiled frequently. Encouraged patient to practice more positive behaviors including: Trying to believe in herself more and her ability to make changes and live  life more fully in the present, recognize her progress and reflect on it often, practice more consistent positive self talk, refrain from assuming worst case scenarios, limit her watching of any distressing news on TV or online, have a healthier balance of meaningful and fun time to spend with family and friends, stop self negating, intentionally look for more positives versus negatives daily, search for the positives within herself and be able to name them, maintain contact with people who are supportive, stay on her prescribed medications, and recognize the strength she shows working with goal directed behaviors to move in a direction that supports her improved emotional health and overall wellbeing  Review and progress/challenges noted with patient.  Next appointment within 3 to 4 weeks.  This record has been created using [employer].  Chart creation errors have been sought, but may not always have been located and corrected.  Such creation errors do not reflect on the standard of medical care provided.   Shanon Ace, LCSW

## 2022-03-22 DIAGNOSIS — L932 Other local lupus erythematosus: Secondary | ICD-10-CM | POA: Diagnosis not present

## 2022-03-22 DIAGNOSIS — L658 Other specified nonscarring hair loss: Secondary | ICD-10-CM | POA: Diagnosis not present

## 2022-03-22 DIAGNOSIS — L299 Pruritus, unspecified: Secondary | ICD-10-CM | POA: Diagnosis not present

## 2022-03-22 DIAGNOSIS — L089 Local infection of the skin and subcutaneous tissue, unspecified: Secondary | ICD-10-CM | POA: Diagnosis not present

## 2022-03-22 DIAGNOSIS — L84 Corns and callosities: Secondary | ICD-10-CM | POA: Diagnosis not present

## 2022-03-22 DIAGNOSIS — W57XXXA Bitten or stung by nonvenomous insect and other nonvenomous arthropods, initial encounter: Secondary | ICD-10-CM | POA: Diagnosis not present

## 2022-03-22 DIAGNOSIS — Z79899 Other long term (current) drug therapy: Secondary | ICD-10-CM | POA: Diagnosis not present

## 2022-03-22 DIAGNOSIS — L649 Androgenic alopecia, unspecified: Secondary | ICD-10-CM | POA: Diagnosis not present

## 2022-03-22 DIAGNOSIS — S60861A Insect bite (nonvenomous) of right wrist, initial encounter: Secondary | ICD-10-CM | POA: Diagnosis not present

## 2022-03-28 ENCOUNTER — Encounter: Payer: Self-pay | Admitting: Psychiatry

## 2022-03-28 ENCOUNTER — Ambulatory Visit (INDEPENDENT_AMBULATORY_CARE_PROVIDER_SITE_OTHER): Payer: Medicare Other | Admitting: Psychiatry

## 2022-03-28 DIAGNOSIS — F331 Major depressive disorder, recurrent, moderate: Secondary | ICD-10-CM | POA: Diagnosis not present

## 2022-03-28 DIAGNOSIS — F419 Anxiety disorder, unspecified: Secondary | ICD-10-CM | POA: Diagnosis not present

## 2022-03-28 MED ORDER — DULOXETINE HCL 20 MG PO CPEP: 20.0000 mg | ORAL_CAPSULE | Freq: Every day | ORAL | 1 refills | Status: DC

## 2022-03-28 NOTE — Progress Notes (Signed)
[name] [id number] 1946/02/06 76 y.o.  Subjective:   Patient ID:  Andrea Burke is a 76 y.o. (DOB 10-02-1946) female.  Chief Complaint:  Chief Complaint  Patient presents with   Medication Problem   Depression   Anxiety    HPI Andrea Burke presents to the office today for follow-up of depression and anxiety. She reports that she had side effects on the 40 mg dose of Cymbalta. She had reported that she felt like she was in a haze, had a headache, and was sleeping more. She reports that she notices some relief in these side effects without them resolving entirely. Tremors are worsening. She reports that her naps were usually 20-30 minutes and been sleeping 1-2 hours and has missed appointments as a result. She reports that she has been sleeping deeper at night and not waking up when she normally would wake up, ie. Not hearing the phone ring. Dreams went away for a few weeks and then returned 2 weeks ago.  She has been more forgetful recently. Starts some days (about twice a week) with a headache and that gradually improves throughout the day.   Taking Cymbalta around 9-10 pm. Denies any correlation with headaches and timing of Cymbalta.   She reports that sadness is "still there... not so overwhelming. It's there underneath." She felt like her mood was a little better when she first start 40 mg and then "went back downhill." Anxiety has been "ok." No change in energy or motivation. Difficulty with concentration. Has not been able to read out of a magazine recently. Denies SI.   She reports that her husband recently had oral surgery.   Reports both parents had tremors  Past medication trials: Remeron- vivid dreams, increased appetite Lexapro-nightmares Sertraline-agitation, insomnia, impaired concentration Paxil-intolerable side effects Fluoxetine-intolerable side effects Viibryd- Partial improvement on 20 mg. Does not want to take doses above 20  mg. Cymbalta Wellbutrin- Severe tremors Lithium Hydroxyzine Xanax Deplin Gabapentin- prescribed for pain  Mini-Mental    Flowsheet Row Office Visit from 11/18/2020 in [facility] [place] Visit from 04/25/2020 in [facility]  Total Score (max 30 points ) 29 29        Review of Systems:  Review of Systems  Musculoskeletal:  Positive for arthralgias and back pain. Negative for gait problem.       Leg cramps  Neurological:  Positive for tremors and headaches.       Occ stumbling and dragging foot. She reports that her face is less expressive. She reports that her speech is softer now.   Psychiatric/Behavioral:         Please refer to HPI  Legs have occ felt weak and buckled  Medications: I have reviewed the patient's current medications.  Current Outpatient Medications  Medication Sig Dispense Refill   BUTALBITAL-ACETAMINOPHEN PO Take by mouth.     Calcium Carbonate-Vitamin D (CALTRATE 600+D PO) Take by mouth.     docusate sodium (COLACE) 100 MG capsule Take 100 mg by mouth daily as needed.     DULoxetine (CYMBALTA) 20 MG capsule Take 1 capsule (20 mg total) by mouth daily. 30 capsule 1   finasteride (PROSCAR) 5 MG tablet Take 2.5 mg by mouth daily.     fish oil-omega-3 fatty acids 1000 MG capsule Take 2 g by mouth daily.     hydroxychloroquine (PLAQUENIL) 200 MG tablet Take by mouth daily.     ibuprofen (ADVIL) 200 MG tablet Take 200 mg by mouth every  6 (six) hours as needed.     Multiple Vitamins-Minerals (CENTRUM PO) Take by mouth.     No current facility-administered medications for this visit.    Medication Side Effects: Other: Possible excessive somnolence  Allergies:  Allergies  Allergen Reactions   Doxycycline Calcium    Penicillins     Past Medical History:  Diagnosis Date   Cancer ([place])    KIDNEY   Discoid lupus    Endometriosis    Headache(784.0)    Insomnia    Kidney disease    Lupus ([place])    Osteopenia    Urinary  incontinence     Past Medical History, Surgical history, Social history, and Family history were reviewed and updated as appropriate.   Please see review of systems for further details on the patient's review from today.   Objective:   Physical Exam:  There were no vitals taken for this visit.  Physical Exam Constitutional:      General: She is not in acute distress. Musculoskeletal:        General: No deformity.  Neurological:     Mental Status: She is alert and oriented to person, place, and time.     Coordination: Coordination normal.  Psychiatric:        Attention and Perception: Attention and perception normal. She does not perceive auditory or visual hallucinations.        Mood and Affect: Mood is depressed. Mood is not anxious. Affect is flat and tearful. Affect is not labile, blunt, angry or inappropriate.        Behavior: Behavior normal.        Thought Content: Thought content normal. Thought content is not paranoid or delusional. Thought content does not include homicidal or suicidal ideation. Thought content does not include homicidal or suicidal plan.        Cognition and Memory: Cognition and memory normal.        Judgment: Judgment normal.     Comments: Insight intact Speech is soft     Lab Review:     Component Value Date/Time   NA 138 07/31/2014 1222   K 4.2 07/31/2014 1222   CL 107 07/31/2014 1222   CO2 24 07/31/2014 1222   GLUCOSE 88 07/31/2014 1222   BUN 11 07/31/2014 1222   CREATININE 0.72 07/31/2014 1222   CALCIUM 9.5 07/31/2014 1222   PROT 7.5 07/31/2014 1222   ALBUMIN 4.3 07/31/2014 1222   AST 21 07/31/2014 1222   ALT 17 07/31/2014 1222   ALKPHOS 54 07/31/2014 1222   BILITOT 0.6 07/31/2014 1222       Component Value Date/Time   WBC 3.7 (L) 09/29/2018 0951   RBC 4.86 09/29/2018 0951   HGB 14.1 09/29/2018 0951   HCT 41.8 09/29/2018 0951   PLT 183 09/29/2018 0951   MCV 86.0 09/29/2018 0951   MCV 86.6 07/31/2014 1222   MCH 29.0  09/29/2018 0951   MCHC 33.7 09/29/2018 0951   RDW 12.2 09/29/2018 0951   LYMPHSABS 1,887 09/29/2018 0951   EOSABS 122 09/29/2018 0951   BASOSABS 30 09/29/2018 0951    No results found for: "POCLITH", "LITHIUM"   No results found for: "PHENYTOIN", "PHENOBARB", "VALPROATE", "CBMZ"   .res Assessment: Plan:    Pt seen for 30 minutes and time spent discussing possible side effects of Cymbalta. She reports that she would like to reduce Cymbalta and possibly stop taking it. Recommended reducing dose to 20 mg and then considering discontinuation to minimize risk of  discontinuation s/s since she has had discontinuation symptoms with other antidepressants. She agrees that it may be best to continue Cymbalta with upcoming trip this summer and then consider discontinuation after her trip.  Discussed referral to neurology for worsening tremor and that neurology would likely be able to evaluate and treat tremor. She reports that both of her parents had tremor. Discussed that it may also be helpful to have neurology rule out familial/essential tremor versus Parkinsons since Parkinsons can also cause some other symptoms she has reported to include sleep disturbance, depression, anxiety, flat affect, memory changes, and diminished sense of smell. She reports that she would prefer to postpone referral to neurology until her next visit. Encouraged her to contact office if she would like for referral to be made prior to next appointment.  Recommend continuing therapy with Rinaldo Cloud, LCSW.  Pt to follow-up in 2 months or sooner if clinically indicated.  Patient advised to contact office with any questions, adverse effects, or acute worsening in signs and symptoms.   Tereza was seen today for medication problem, depression and anxiety.  Diagnoses and all orders for this visit:  Moderate episode of recurrent major depressive disorder (HCC) -     DULoxetine (CYMBALTA) 20 MG capsule; Take 1 capsule (20 mg total)  by mouth daily.  Anxiety disorder, unspecified type -     DULoxetine (CYMBALTA) 20 MG capsule; Take 1 capsule (20 mg total) by mouth daily.     Please see After Visit Summary for patient specific instructions.  Future Appointments  Date Time Provider [place]  05/02/2022  3:00 PM Shanon Ace, LCSW [facility] None  06/05/2022  3:30 PM Thayer Headings, PMHNP [facility] None    No orders of the defined types were placed in this encounter.   -------------------------------

## 2022-04-19 NOTE — Telephone Encounter (Signed)
Pt response.

## 2022-04-26 DIAGNOSIS — M4310 Spondylolisthesis, site unspecified: Secondary | ICD-10-CM | POA: Diagnosis not present

## 2022-04-26 DIAGNOSIS — R519 Headache, unspecified: Secondary | ICD-10-CM | POA: Diagnosis not present

## 2022-04-26 DIAGNOSIS — M62838 Other muscle spasm: Secondary | ICD-10-CM | POA: Diagnosis not present

## 2022-04-26 DIAGNOSIS — M47816 Spondylosis without myelopathy or radiculopathy, lumbar region: Secondary | ICD-10-CM | POA: Diagnosis not present

## 2022-04-26 DIAGNOSIS — R42 Dizziness and giddiness: Secondary | ICD-10-CM | POA: Diagnosis not present

## 2022-04-30 DIAGNOSIS — M9902 Segmental and somatic dysfunction of thoracic region: Secondary | ICD-10-CM | POA: Diagnosis not present

## 2022-04-30 DIAGNOSIS — M5137 Other intervertebral disc degeneration, lumbosacral region: Secondary | ICD-10-CM | POA: Diagnosis not present

## 2022-04-30 DIAGNOSIS — M9903 Segmental and somatic dysfunction of lumbar region: Secondary | ICD-10-CM | POA: Diagnosis not present

## 2022-04-30 DIAGNOSIS — M50322 Other cervical disc degeneration at C5-C6 level: Secondary | ICD-10-CM | POA: Diagnosis not present

## 2022-04-30 DIAGNOSIS — M9901 Segmental and somatic dysfunction of cervical region: Secondary | ICD-10-CM | POA: Diagnosis not present

## 2022-04-30 DIAGNOSIS — M50321 Other cervical disc degeneration at C4-C5 level: Secondary | ICD-10-CM | POA: Diagnosis not present

## 2022-04-30 DIAGNOSIS — R110 Nausea: Secondary | ICD-10-CM | POA: Diagnosis not present

## 2022-04-30 DIAGNOSIS — H8112 Benign paroxysmal vertigo, left ear: Secondary | ICD-10-CM | POA: Diagnosis not present

## 2022-05-01 DIAGNOSIS — L292 Pruritus vulvae: Secondary | ICD-10-CM | POA: Diagnosis not present

## 2022-05-01 DIAGNOSIS — Z803 Family history of malignant neoplasm of breast: Secondary | ICD-10-CM | POA: Diagnosis not present

## 2022-05-01 DIAGNOSIS — Z9189 Other specified personal risk factors, not elsewhere classified: Secondary | ICD-10-CM | POA: Diagnosis not present

## 2022-05-01 DIAGNOSIS — M8580 Other specified disorders of bone density and structure, unspecified site: Secondary | ICD-10-CM | POA: Diagnosis not present

## 2022-05-01 DIAGNOSIS — N3946 Mixed incontinence: Secondary | ICD-10-CM | POA: Diagnosis not present

## 2022-05-01 DIAGNOSIS — N6459 Other signs and symptoms in breast: Secondary | ICD-10-CM | POA: Diagnosis not present

## 2022-05-02 ENCOUNTER — Ambulatory Visit (INDEPENDENT_AMBULATORY_CARE_PROVIDER_SITE_OTHER): Payer: Medicare Other | Admitting: Psychiatry

## 2022-05-02 DIAGNOSIS — F331 Major depressive disorder, recurrent, moderate: Secondary | ICD-10-CM

## 2022-05-02 NOTE — Progress Notes (Signed)
[facility] Counselor/Therapist Progress Note  Patient ID: Andrea Burke, MRN: [id number],    Date: 05/02/2022  Time Spent:  55 minutes  Treatment Type: Individual Therapy  Reported Symptoms: depression, anxiety, some tearfulness, some fears about the future  Mental Status Exam:  Appearance:   Neat     Behavior:  Appropriate and Sharing  Motor:  Normal  Speech/Language:   Clear and Coherent  Affect:  Depressed and Flat  Mood:  anxious and depressed  Thought process:  goal directed  Thought content:    Some obsessive worrying about future  Sensory/Perceptual disturbances:    WNL  Orientation:  oriented to person, place, time/date, situation, day of week, month of year, year, and stated date of May 02, 2022  Attention:  Fair  Concentration:  Fair  Memory:  Some short term memory concerns  Fund of knowledge:   Good  Insight:    Good and Fair  Judgment:   Good  Impulse Control:  Good   Risk Assessment: Danger to Self:  No Self-injurious Behavior: No Danger to Others: No Duty to Warn:no Physical Aggression / Violence:No  Access to Firearms a concern: No  Gang Involvement:No   Subjective: Patient today showing good participation and some motivation (she reports "lower").  Feels her mood has lowered since last appt. Having some back pain "and head sometimes "heavy and sometimes having dizziness". Has talked with regular PCP and a couple "other doctors" and they felt it was vertigo and was given medicine. Feels the family stress "is not in the forefront right now as they are focusing on other things."Trying to not watch as much disturbing TV which adds to her depression and anxiety. States her anxiety does feel some better and "I'm not dwelling on it as much."  Still some procrastination with task at home.  Some tearfulness today in talking about family.  Showed more smiling towards the end of session and talking about upcoming trip with family.   Interventions: Cognitive  Behavioral Therapy and Ego-Supportive  Long-Term goal: (measurable)  Enhance the ability to handle effectively the full variety of life's anxieties. Patient will eventually rate herself as a "3 or under on a 1-10 scale for depression and for anxiety, for a period of at least 2 months."  Strategies:  Patient will work on re-framing thoughts that have been leading her to feel more anxious.  The re-framed thoughts will support more calmness and personal empowerment for patient.     Short Term goal:  Increase understanding of beliefs and messages that produce worry and anxiety. Strategies: Patient will work with therapist to gain more understanding of how anxiety and worry are supported by certain beliefs and messages.  She will be able to give at least 3 examples of such messages in an upcoming session.  Diagnosis:   ICD-10-CM   1. Moderate episode of recurrent major depressive disorder ([place])  F33.1      Plan: Patient today showing some motivation and active participation in session.  She reports feeling some lower but thinks it is because of her back pain and some vertigo issues as noted above.  Processes several family stressors that she feels have contributed to her depression and seemed to feel more upbeat and a little more motivated afterwards.  Noticeably smiling more later in the session.  Seems to have some mixed anxiety about upcoming family trip but is looking forward to it.  A lot of focus still on her and her husband's  health. Encouraged patient in her practice of more positive behaviors including: Trying to believe in herself more and her ability to make changes and live life more fully in the present, recognizing her progress, practice more consistent positive self talk, refrain from assuming worst case scenarios, limit her watching of distressing news on TV or online, have a healthier balance of meaningful and fun time to spend with family and friends, stop self negating, intentionally  look for more positives versus negatives daily, search for the positives within herself and be able to name them, maintain contact with people who are supportive, stay on her prescribed medications, and realize the strength she shows working with goal directed behaviors to move in a direction that supports her improved emotional health and overall wellbeing.  Goal review and progress/challenges noted with patient.  Next appointment within 1 month.  This record has been created using [employer].  Chart creation errors have been sought, but may not always have been located and corrected.  Such creation errors do not reflect on the standard of medical care provided.   Shanon Ace, LCSW

## 2022-05-11 DIAGNOSIS — M8589 Other specified disorders of bone density and structure, multiple sites: Secondary | ICD-10-CM | POA: Diagnosis not present

## 2022-05-11 DIAGNOSIS — Z780 Asymptomatic menopausal state: Secondary | ICD-10-CM | POA: Diagnosis not present

## 2022-06-01 DIAGNOSIS — M5451 Vertebrogenic low back pain: Secondary | ICD-10-CM | POA: Diagnosis not present

## 2022-06-01 DIAGNOSIS — M5416 Radiculopathy, lumbar region: Secondary | ICD-10-CM | POA: Diagnosis not present

## 2022-06-04 ENCOUNTER — Other Ambulatory Visit: Payer: Self-pay | Admitting: Orthopedic Surgery

## 2022-06-04 DIAGNOSIS — M545 Low back pain, unspecified: Secondary | ICD-10-CM

## 2022-06-05 ENCOUNTER — Encounter: Payer: Self-pay | Admitting: Psychiatry

## 2022-06-05 ENCOUNTER — Ambulatory Visit (INDEPENDENT_AMBULATORY_CARE_PROVIDER_SITE_OTHER): Payer: Medicare Other | Admitting: Psychiatry

## 2022-06-05 DIAGNOSIS — F419 Anxiety disorder, unspecified: Secondary | ICD-10-CM

## 2022-06-05 DIAGNOSIS — F331 Major depressive disorder, recurrent, moderate: Secondary | ICD-10-CM

## 2022-06-05 MED ORDER — DULOXETINE HCL 30 MG PO CPEP: 30.0000 mg | ORAL_CAPSULE | Freq: Every day | ORAL | 1 refills | Status: DC

## 2022-06-05 NOTE — Progress Notes (Signed)
CHARNICE Burke [id number] 1945-12-30 76 y.o.  Subjective:   Patient ID:  Andrea Burke is a 76 y.o. (DOB 02/22/1946) female.  Chief Complaint:  Chief Complaint  Patient presents with   Depression   Anxiety    HPI Andrea Burke presents to the office today for follow-up of depression, anxiety, and sleep disturbance.  She reports that she is "all over the place... feel fuzzy." She reports that she had noticed some difficulty with concentration and focus. She has been feeling sleepy "a lot of days" even after taking a nap. Has not been able to do things outside due to heat. Friend's husbnad recently died and this triggered thoughts about how she would be if she lost her husband. Reports feeling a "little jittery." Denies sad mood. Energy has been low. Motivation has been low some days. Appetite has been decreased. She reports that she snacks and is eating few meals.  She reports that she continues to have dreams and nightmares. Her husband tells her that she has been screaming out in her sleep and "it's getting worse." She reports waking up every 2-3 hours to go to the bathroom and is able to fall back to sleep. Denies SI.   She reports that she has been having other health issues that have been keeping her preoccupied- "I don't have time to think about where I am mentally."  Took a cruise for a week.   Past medication trials: Remeron- vivid dreams, increased appetite Lexapro-nightmares Sertraline-agitation, insomnia, impaired concentration Paxil-intolerable side effects Fluoxetine-intolerable side effects Viibryd- Partial improvement on 20 mg. Does not want to take doses above 20 mg. Cymbalta Wellbutrin- Severe tremors Lithium Hydroxyzine Xanax Deplin Gabapentin- prescribed for pain  Mini-Mental    Flowsheet Row Office Visit from 11/18/2020 in [place] Visit from 04/25/2020 in [facility]  Total Score (max 30 points ) 29 29         Review of Systems:  Review of Systems  Gastrointestinal:  Positive for nausea.  Genitourinary:  Positive for frequency. Negative for dysuria.  Musculoskeletal:  Positive for back pain. Negative for gait problem.  Neurological:        "My head isn't clear."   Psychiatric/Behavioral:         Please refer to HPI   Will have an MRI or the spine.   Medications: I have reviewed the patient's current medications.  Current Outpatient Medications  Medication Sig Dispense Refill   BUTALBITAL-ACETAMINOPHEN PO Take by mouth as needed.     Calcium Carbonate-Vitamin D (CALTRATE 600+D PO) Take by mouth.     docusate sodium (COLACE) 100 MG capsule Take 100 mg by mouth daily as needed.     finasteride (PROSCAR) 5 MG tablet Take 2.5 mg by mouth daily.     fish oil-omega-3 fatty acids 1000 MG capsule Take 2 g by mouth daily.     hydroxychloroquine (PLAQUENIL) 200 MG tablet Take by mouth daily.     ibuprofen (ADVIL) 200 MG tablet Take 200 mg by mouth every 6 (six) hours as needed.     Multiple Vitamins-Minerals (CENTRUM PO) Take by mouth.     ondansetron (ZOFRAN) 4 MG tablet Take 4 mg by mouth every 8 (eight) hours as needed.     DULoxetine (CYMBALTA) 30 MG capsule Take 1 capsule (30 mg total) by mouth daily. 30 capsule 1   No current facility-administered medications for this visit.    Medication Side Effects: Other: N/A  Allergies:  Allergies  Allergen Reactions   Doxycycline Calcium    Penicillins     Past Medical History:  Diagnosis Date   Cancer ([place])    KIDNEY   Discoid lupus    Endometriosis    Headache(784.0)    Insomnia    Kidney disease    Lupus ([place])    Osteopenia    Urinary incontinence     Past Medical History, Surgical history, Social history, and Family history were reviewed and updated as appropriate.   Please see review of systems for further details on the patient's review from today.   Objective:   Physical Exam:  There were no vitals taken for  this visit.  Physical Exam Constitutional:      General: She is not in acute distress. Musculoskeletal:        General: No deformity.  Neurological:     Mental Status: She is alert and oriented to person, place, and time.     Coordination: Coordination normal.  Psychiatric:        Attention and Perception: Attention and perception normal. She does not perceive auditory or visual hallucinations.        Mood and Affect: Mood is anxious and depressed. Affect is not labile, blunt, angry or inappropriate.        Speech: Speech normal.        Behavior: Behavior normal.        Thought Content: Thought content normal. Thought content is not paranoid or delusional. Thought content does not include homicidal or suicidal ideation. Thought content does not include homicidal or suicidal plan.        Cognition and Memory: Cognition and memory normal.        Judgment: Judgment normal.     Comments: Insight intact     Lab Review:     Component Value Date/Time   NA 138 07/31/2014 1222   K 4.2 07/31/2014 1222   CL 107 07/31/2014 1222   CO2 24 07/31/2014 1222   GLUCOSE 88 07/31/2014 1222   BUN 11 07/31/2014 1222   CREATININE 0.72 07/31/2014 1222   CALCIUM 9.5 07/31/2014 1222   PROT 7.5 07/31/2014 1222   ALBUMIN 4.3 07/31/2014 1222   AST 21 07/31/2014 1222   ALT 17 07/31/2014 1222   ALKPHOS 54 07/31/2014 1222   BILITOT 0.6 07/31/2014 1222       Component Value Date/Time   WBC 3.7 (L) 09/29/2018 0951   RBC 4.86 09/29/2018 0951   HGB 14.1 09/29/2018 0951   HCT 41.8 09/29/2018 0951   PLT 183 09/29/2018 0951   MCV 86.0 09/29/2018 0951   MCV 86.6 07/31/2014 1222   MCH 29.0 09/29/2018 0951   MCHC 33.7 09/29/2018 0951   RDW 12.2 09/29/2018 0951   LYMPHSABS 1,887 09/29/2018 0951   EOSABS 122 09/29/2018 0951   BASOSABS 30 09/29/2018 0951    No results found for: "POCLITH", "LITHIUM"   No results found for: "PHENYTOIN", "PHENOBARB", "VALPROATE", "CBMZ"   .res Assessment: Plan:     Pt seen for 30 minutes and time spent discussing treatment options since she reports that she is continuing to experience some symptoms off medication that she previously attributed to medication side effects. Discussed option of re-starting Cymbalta or starting a different medication. Will re-start Cymbalta 30 mg po qd since it was partially helpful for her mood and anxiety, and possibly pain as well.  Recommend continuing therapy with Rinaldo Cloud, LCSW.  Pt to follow-up with this provider in 6 weeks or sooner  if clinically indicated.  Patient advised to contact office with any questions, adverse effects, or acute worsening in signs and symptoms.  [place] was seen today for depression and anxiety.  Diagnoses and all orders for this visit:  Moderate episode of recurrent major depressive disorder (HCC) -     DULoxetine (CYMBALTA) 30 MG capsule; Take 1 capsule (30 mg total) by mouth daily.  Anxiety disorder, unspecified type -     DULoxetine (CYMBALTA) 30 MG capsule; Take 1 capsule (30 mg total) by mouth daily.     Please see After Visit Summary for patient specific instructions.  Future Appointments  Date Time Provider [place]  06/09/2022  6:30 PM [facility] MR 1 [facility]-315MRI [facility] [address] [facility]  06/14/2022  3:00 PM Shanon Ace, LCSW CP-CP None  07/17/2022  3:30 PM Thayer Headings, PMHNP CP-CP None    No orders of the defined types were placed in this encounter.   -------------------------------

## 2022-06-09 ENCOUNTER — Ambulatory Visit
Admission: RE | Admit: 2022-06-09 | Discharge: 2022-06-09 | Disposition: A | Discharge status: Home / Self Care | Payer: Medicare Other | Source: Ambulatory Visit | Attending: Orthopedic Surgery | Admitting: Orthopedic Surgery

## 2022-06-09 DIAGNOSIS — M545 Low back pain, unspecified: Secondary | ICD-10-CM | POA: Diagnosis not present

## 2022-06-09 DIAGNOSIS — M48061 Spinal stenosis, lumbar region without neurogenic claudication: Secondary | ICD-10-CM | POA: Diagnosis not present

## 2022-06-14 ENCOUNTER — Ambulatory Visit (INDEPENDENT_AMBULATORY_CARE_PROVIDER_SITE_OTHER): Payer: Medicare Other | Admitting: Psychiatry

## 2022-06-14 DIAGNOSIS — F419 Anxiety disorder, unspecified: Secondary | ICD-10-CM

## 2022-06-14 NOTE — Progress Notes (Signed)
[facility] Counselor/Therapist Progress Note  Patient ID: Andrea Burke, MRN: [id number],    Date: 06/14/2022  Time Spent: 55 minutes   Treatment Type: Individual Therapy  Reported Symptoms: anxiety, some depression, concerned about health isssues  Mental Status Exam:  Appearance:   Casual and Neat     Behavior:  Appropriate, Sharing, and Motivated  Motor:  Normal  Speech/Language:   Clear and Coherent  Affect:  anxious  Mood:  anxious and some depression  Thought process:  goal directed  Thought content:    WNL  Sensory/Perceptual disturbances:    WNL  Orientation:  oriented to person, place, time/date, situation, day of week, month of year, year, and stated date of Sept 7, 2023  Attention:  Fair  Concentration:  Fair  Memory:  "Occasional short term memory issues"  Fund of knowledge:   Good  Insight:    Good and Fair  Judgment:   Good  Impulse Control:  Good   Risk Assessment: Danger to Self:  No Self-injurious Behavior: No Danger to Others: No Duty to Warn:no Physical Aggression / Violence:No  Access to Firearms a concern: No  Gang Involvement:No   Subjective: Patient today reporting anxiety and some depression, self-doubt especially in decision-making.  Back pain has gotten some better although to see orthopedic doctor next week at [place]. States she feels "stuck" in some ways and that "I'm not accomplishing things". States "I should be getting more daily chores done, should be sorting through things to keep or throw out, I'm not looking ahead enough."  Discussed her tendency to "go home, put my pocketbook down, and sit in a chair" rather than attending to some chores she feels that she should be doing such as "folding laundry".  States this is a typical pattern for her even when she is not doing something that is normally tiring.  Stated her motivation is hard to get started but once she completes something she feels better "but it is the getting  started".  Per her request did talk about some motivational steps she might could take that would be helpful using an example of one of the chores she mentioned today that she needed to do when she got home.  Also discussed her having a short priority list when it comes to the chores she feels she needs to complete.  Reports her back pain is some better and that is encouraging.  No tearfulness today.  Interventions: Cognitive Behavioral Therapy and Ego-Supportive  Long-Term goal: (measurable)  Enhance the ability to handle effectively the full variety of life's anxieties. Patient will eventually rate herself as a "3 or under on a 1-10 scale for depression and for anxiety, for a period of at least 2 months."  Strategies:  Patient will work on re-framing thoughts that have been leading her to feel more anxious.  The re-framed thoughts will support more calmness and personal empowerment for patient.     Short Term goal:  Increase understanding of beliefs and messages that produce worry and anxiety. Strategies: Patient will work with therapist to gain more understanding of how anxiety and worry are supported by certain beliefs and messages.  She will be able to give at least 3 examples of such messages in an upcoming session.   Diagnosis:   ICD-10-CM   1. Anxiety disorder, unspecified type  F41.9      Plan: Patient in today showing some motivation and actively participated in session.  As noted above, her  back pain has gotten better although is to see orthopedic doctor next week as a follow-up.  Feeling stuck and getting things accomplished and worked on this in session as well as certain strategies she might be able to use to help get certain tasks accomplished but also taking the break she may need to feel rested.  Did encourage her being around other people more as she is able including family and others.  Added that things within the family are going pretty good right now with no significant  stressors.  They had a good vacation recently with the grandchildren.  Husband continues to have some health issues.  Patient is feeling that with her back being better, the orthopedic doctor will probably not do anything different but maybe encourage her to keep doing what she is doing now as long as it is better. Encouraged patient in practicing more positive behaviors including: Believing more in herself and her ability to make changes and live life more fully in the present, recognizing her progress, practice more consistent positive self talk, refrain from assuming worst case scenarios, limit her watching of distressing news on TV or online, have a healthier balance of meaningful and fun time to spend with family and friends, stop self negating, intentionally look for more positives versus negatives each day, search for the positives within herself and be able to name them, maintain contact with people who are supportive, stay on her prescribed medications, and recognize the strength she shows working with goal directed behaviors to move in a direction that supports her improved emotional health.  Goal review and progress/challenges noted with patient.  Next appointment within 4 to 5 weeks.  This record has been created using [employer].  Chart creation errors have been sought, but may not always have been located and corrected.  Such creation errors do not reflect on the standard of medical care provided.   Shanon Ace, LCSW

## 2022-06-22 DIAGNOSIS — M5416 Radiculopathy, lumbar region: Secondary | ICD-10-CM | POA: Diagnosis not present

## 2022-06-25 DIAGNOSIS — Z1231 Encounter for screening mammogram for malignant neoplasm of breast: Secondary | ICD-10-CM | POA: Diagnosis not present

## 2022-07-04 DIAGNOSIS — M5416 Radiculopathy, lumbar region: Secondary | ICD-10-CM | POA: Diagnosis not present

## 2022-07-04 DIAGNOSIS — M48061 Spinal stenosis, lumbar region without neurogenic claudication: Secondary | ICD-10-CM | POA: Diagnosis not present

## 2022-07-04 DIAGNOSIS — M6281 Muscle weakness (generalized): Secondary | ICD-10-CM | POA: Diagnosis not present

## 2022-07-11 DIAGNOSIS — M6281 Muscle weakness (generalized): Secondary | ICD-10-CM | POA: Diagnosis not present

## 2022-07-11 DIAGNOSIS — M48061 Spinal stenosis, lumbar region without neurogenic claudication: Secondary | ICD-10-CM | POA: Diagnosis not present

## 2022-07-11 DIAGNOSIS — M5416 Radiculopathy, lumbar region: Secondary | ICD-10-CM | POA: Diagnosis not present

## 2022-07-12 DIAGNOSIS — Z23 Encounter for immunization: Secondary | ICD-10-CM | POA: Diagnosis not present

## 2022-07-13 DIAGNOSIS — M5416 Radiculopathy, lumbar region: Secondary | ICD-10-CM | POA: Diagnosis not present

## 2022-07-13 DIAGNOSIS — M6281 Muscle weakness (generalized): Secondary | ICD-10-CM | POA: Diagnosis not present

## 2022-07-13 DIAGNOSIS — M48061 Spinal stenosis, lumbar region without neurogenic claudication: Secondary | ICD-10-CM | POA: Diagnosis not present

## 2022-07-16 DIAGNOSIS — M5416 Radiculopathy, lumbar region: Secondary | ICD-10-CM | POA: Diagnosis not present

## 2022-07-16 DIAGNOSIS — M48061 Spinal stenosis, lumbar region without neurogenic claudication: Secondary | ICD-10-CM | POA: Diagnosis not present

## 2022-07-16 DIAGNOSIS — M6281 Muscle weakness (generalized): Secondary | ICD-10-CM | POA: Diagnosis not present

## 2022-07-17 ENCOUNTER — Ambulatory Visit: Payer: Medicare Other | Admitting: Psychiatry

## 2022-07-18 DIAGNOSIS — M6281 Muscle weakness (generalized): Secondary | ICD-10-CM | POA: Diagnosis not present

## 2022-07-18 DIAGNOSIS — M5416 Radiculopathy, lumbar region: Secondary | ICD-10-CM | POA: Diagnosis not present

## 2022-07-18 DIAGNOSIS — M48061 Spinal stenosis, lumbar region without neurogenic claudication: Secondary | ICD-10-CM | POA: Diagnosis not present

## 2022-07-23 ENCOUNTER — Encounter: Payer: Self-pay | Admitting: Psychiatry

## 2022-07-23 ENCOUNTER — Ambulatory Visit (INDEPENDENT_AMBULATORY_CARE_PROVIDER_SITE_OTHER): Payer: Medicare Other | Admitting: Psychiatry

## 2022-07-23 DIAGNOSIS — M6281 Muscle weakness (generalized): Secondary | ICD-10-CM | POA: Diagnosis not present

## 2022-07-23 DIAGNOSIS — F419 Anxiety disorder, unspecified: Secondary | ICD-10-CM

## 2022-07-23 DIAGNOSIS — M5416 Radiculopathy, lumbar region: Secondary | ICD-10-CM | POA: Diagnosis not present

## 2022-07-23 DIAGNOSIS — M48061 Spinal stenosis, lumbar region without neurogenic claudication: Secondary | ICD-10-CM | POA: Diagnosis not present

## 2022-07-23 DIAGNOSIS — F330 Major depressive disorder, recurrent, mild: Secondary | ICD-10-CM | POA: Diagnosis not present

## 2022-07-23 MED ORDER — DULOXETINE HCL 30 MG PO CPEP: 30.0000 mg | ORAL_CAPSULE | Freq: Every day | ORAL | 1 refills | Status: DC

## 2022-07-23 NOTE — Progress Notes (Signed)
Andrea Burke [id number] 1945-12-28 76 y.o.  Subjective:   Patient ID:  Andrea Burke is a 76 y.o. (DOB 10-Jan-1946) female.  Chief Complaint:  Chief Complaint  Patient presents with   Follow-up    Depression and anxiety    HPI Andrea Burke presents to the office today for follow-up of depression and anxiety.  She reports "everything was going haywire there for awhile..hospital fuzzy head, body... everything has leveled out now." She reports that her mood is "kind of even." She describes some improvement in energy and motivation. She reports that she has been falling asleep without difficulty and staying asleep. She reports having occasional dreams. She was told she screamed during one dream and fell out of bed during another dream. Appetite is occasionally decreased. Concentration has been ok. Denies SI.   She has been working outside some in her yard and enjoying this. She enjoys trimming and planting.   She reports that her husband's health is "about the same."  Past medication trials: Remeron- vivid dreams, increased appetite Lexapro-nightmares Sertraline-agitation, insomnia, impaired concentration Paxil-intolerable side effects Fluoxetine-intolerable side effects Viibryd- Partial improvement on 20 mg. Does not want to take doses above 20 mg. Cymbalta Wellbutrin- Severe tremors Lithium Hydroxyzine Xanax Deplin Gabapentin- prescribed for pain  Mini-Mental    Flowsheet Row Office Visit from 11/18/2020 in [place] Visit from 04/25/2020 in [facility]  Total Score (max 30 points ) 29 29        Review of Systems:  Review of Systems  Gastrointestinal:  Negative for constipation.  Musculoskeletal:  Negative for gait problem.       She has been going to PT. She reports that her back pain is somewhat better.   Psychiatric/Behavioral:         Please refer to HPI    Medications: I have reviewed the patient's current  medications.  Current Outpatient Medications  Medication Sig Dispense Refill   BUTALBITAL-ACETAMINOPHEN PO Take by mouth as needed.     Calcium Carbonate-Vitamin D (CALTRATE 600+D PO) Take by mouth.     finasteride (PROSCAR) 5 MG tablet Take 2.5 mg by mouth daily.     fish oil-omega-3 fatty acids 1000 MG capsule Take 2 g by mouth daily.     hydroxychloroquine (PLAQUENIL) 200 MG tablet Take by mouth daily.     Multiple Vitamins-Minerals (CENTRUM PO) Take by mouth.     docusate sodium (COLACE) 100 MG capsule Take 100 mg by mouth daily as needed. (Patient not taking: Reported on 07/23/2022)     DULoxetine (CYMBALTA) 30 MG capsule Take 1 capsule (30 mg total) by mouth daily. 30 capsule 1   ibuprofen (ADVIL) 200 MG tablet Take 200 mg by mouth every 6 (six) hours as needed. (Patient not taking: Reported on 07/23/2022)     ondansetron (ZOFRAN) 4 MG tablet Take 4 mg by mouth every 8 (eight) hours as needed. (Patient not taking: Reported on 07/23/2022)     No current facility-administered medications for this visit.    Medication Side Effects: None  Allergies:  Allergies  Allergen Reactions   Doxycycline Calcium    Penicillins     Past Medical History:  Diagnosis Date   Cancer ([place])    KIDNEY   Discoid lupus    Endometriosis    Headache(784.0)    Insomnia    Kidney disease    Lupus ([place])    Osteopenia    Urinary incontinence     Past Medical History,  Surgical history, Social history, and Family history were reviewed and updated as appropriate.   Please see review of systems for further details on the patient's review from today.   Objective:   Physical Exam:  There were no vitals taken for this visit.  Physical Exam Constitutional:      General: She is not in acute distress. Musculoskeletal:        General: No deformity.  Neurological:     Mental Status: She is alert and oriented to person, place, and time.     Coordination: Coordination normal.  Psychiatric:         Attention and Perception: Attention and perception normal. She does not perceive auditory or visual hallucinations.        Mood and Affect: Affect is not labile, blunt, angry or inappropriate.        Speech: Speech normal.        Behavior: Behavior normal.        Thought Content: Thought content normal. Thought content is not paranoid or delusional. Thought content does not include homicidal or suicidal ideation. Thought content does not include homicidal or suicidal plan.        Cognition and Memory: Cognition and memory normal.        Judgment: Judgment normal.     Comments: Insight intact Mood presents as less anxious and less depressed compared to recent exams     Lab Review:     Component Value Date/Time   NA 138 07/31/2014 1222   K 4.2 07/31/2014 1222   CL 107 07/31/2014 1222   CO2 24 07/31/2014 1222   GLUCOSE 88 07/31/2014 1222   BUN 11 07/31/2014 1222   CREATININE 0.72 07/31/2014 1222   CALCIUM 9.5 07/31/2014 1222   PROT 7.5 07/31/2014 1222   ALBUMIN 4.3 07/31/2014 1222   AST 21 07/31/2014 1222   ALT 17 07/31/2014 1222   ALKPHOS 54 07/31/2014 1222   BILITOT 0.6 07/31/2014 1222       Component Value Date/Time   WBC 3.7 (L) 09/29/2018 0951   RBC 4.86 09/29/2018 0951   HGB 14.1 09/29/2018 0951   HCT 41.8 09/29/2018 0951   PLT 183 09/29/2018 0951   MCV 86.0 09/29/2018 0951   MCV 86.6 07/31/2014 1222   MCH 29.0 09/29/2018 0951   MCHC 33.7 09/29/2018 0951   RDW 12.2 09/29/2018 0951   LYMPHSABS 1,887 09/29/2018 0951   EOSABS 122 09/29/2018 0951   BASOSABS 30 09/29/2018 0951    No results found for: "POCLITH", "LITHIUM"   No results found for: "PHENYTOIN", "PHENOBARB", "VALPROATE", "CBMZ"   .res Assessment: Plan:   Pt seen for 30 minutes and time spent discussing response to re-start of Cymbalta 30 mg po qd and also responding to her questions about depressive symptoms and what are realistic goals for treatment. Discussed that she is describing some improvement  in mood and anxiety symptoms with increased enjoyment in things and improved energy and motivation. Recommend continuing Cymbalta 30 mg po qd for anxiety and depression.  Encouraged pt to further discuss setting appropriate boundaries with some family members and also gauging when she may be experiencing worsening depressive symptoms.  Recommend continuing therapy with Rinaldo Cloud, LCSW.  Pt to follow-up in 3 months or sooner if clinically indicated.  Patient advised to contact office with any questions, adverse effects, or acute worsening in signs and symptoms.    Andrea Burke was seen today for follow-up.  Diagnoses and all orders for this visit:  Mild  episode of recurrent major depressive disorder (HCC) -     DULoxetine (CYMBALTA) 30 MG capsule; Take 1 capsule (30 mg total) by mouth daily.  Anxiety disorder, unspecified type -     DULoxetine (CYMBALTA) 30 MG capsule; Take 1 capsule (30 mg total) by mouth daily.     Please see After Visit Summary for patient specific instructions.  Future Appointments  Date Time Provider [place]  07/31/2022  3:00 PM Shanon Ace, LCSW CP[facility] None  10/22/2022  3:00 PM Thayer Headings, PMHNP CP[facility] None    No orders of the defined types were placed in this encounter.   -------------------------------

## 2022-07-25 DIAGNOSIS — M6281 Muscle weakness (generalized): Secondary | ICD-10-CM | POA: Diagnosis not present

## 2022-07-25 DIAGNOSIS — M48061 Spinal stenosis, lumbar region without neurogenic claudication: Secondary | ICD-10-CM | POA: Diagnosis not present

## 2022-07-25 DIAGNOSIS — M5416 Radiculopathy, lumbar region: Secondary | ICD-10-CM | POA: Diagnosis not present

## 2022-07-30 DIAGNOSIS — M48061 Spinal stenosis, lumbar region without neurogenic claudication: Secondary | ICD-10-CM | POA: Diagnosis not present

## 2022-07-30 DIAGNOSIS — M5416 Radiculopathy, lumbar region: Secondary | ICD-10-CM | POA: Diagnosis not present

## 2022-07-30 DIAGNOSIS — M6281 Muscle weakness (generalized): Secondary | ICD-10-CM | POA: Diagnosis not present

## 2022-07-31 ENCOUNTER — Ambulatory Visit (INDEPENDENT_AMBULATORY_CARE_PROVIDER_SITE_OTHER): Payer: Medicare Other | Admitting: Psychiatry

## 2022-07-31 DIAGNOSIS — F419 Anxiety disorder, unspecified: Secondary | ICD-10-CM

## 2022-07-31 NOTE — Progress Notes (Signed)
[facility] Counselor/Therapist Progress Note  Patient ID: Andrea Burke, MRN: [id number],    Date: 07/31/2022  Time Spent: 55 minutes   Treatment Type: Individual Therapy  Reported Symptoms: anxiety, depression (improved)  Mental Status Exam:  Appearance:   Neat and Well Groomed     Behavior:  Appropriate, Sharing, and Motivated  Motor:  Normal  Speech/Language:   Clear and Coherent  Affect:  Anxious, some flatness  Mood:  anxious and some depression  Thought process:  goal directed  Thought content:    Prior obsessive thoughts but reports "much better lately"  Sensory/Perceptual disturbances:    WNL  Orientation:  oriented to person, place, time/date, situation, day of week, month of year, year, and stated date of Oct. 24, 2023  Attention:  Fair  Concentration:  Fair  Memory:  Short term memory issues occasionally  Fund of knowledge:   Good  Insight:    Good and Fair  Judgment:   Good  Impulse Control:  Good   Risk Assessment: Danger to Self:  No Self-injurious Behavior: No Danger to Others: No Duty to Warn:no Physical Aggression / Violence:No  Access to Firearms a concern: No  Gang Involvement:No   Subjective:  Patient in today reporting anxiety and some depression (improved). Motivated but states it can be up and down. Had shared with med provider last wk about having dreams a couple of nights recently and falling out of her bed; states that has not occurred anymore. Self-doubt has improved since last session. "Have not had anymore major issues with that."  Her back pain has improved some but hasn't been doing any lifting nor yard work, and is close to finishing her PT which has helped some. Not feeling as "stuck" as she was last session. Not getting as much done as her back has not totally healed yet and she didn't  want to re-hurt it. To be taking breaks and walking more and trying to better focus on that. Some better at following through on chores at home and  not putting them off. Still working to elevate her motivation by being a "little more encouraging of myself". No sadness reported, and no tearfulness in session. Smiled more.   Interventions: Cognitive Behavioral Therapy and Ego-Supportive   Long-Term goal: (measurable)  Enhance the ability to handle effectively the full variety of life's anxieties. Patient will eventually rate herself as a "3 or under on a 1-10 scale for depression and for anxiety, for a period of at least 2 months."  Strategies:  Patient will work on re-framing thoughts that have been leading her to feel more anxious.  The re-framed thoughts will support more calmness and personal empowerment for patient.     Short Term goal:  Increase understanding of beliefs and messages that produce worry and anxiety. Strategies: Patient will work with therapist to gain more understanding of how anxiety and worry are supported by certain beliefs and messages.  She will be able to give at least 3 examples of such messages in an upcoming session.   Diagnosis:   ICD-10-CM   1. Anxiety disorder, unspecified type  F41.9       Plan: Patient noticing improvement in her depression, and is still working with goal directed behaviors and more positive self-talk in working to further decrease her anxiety. Worrying "some less lately". Used diagram in office related to "excessive worrying" and ways she can interrupt worrying cycles.  Related very well to the way worrying was demonstrated  on the diagram and took a copy with her to work on in between sessions.  Good to not feel as "stuck" as noted above.  Is limited some in her moving about due to healing from a back injury that was determined to not be related to "her bones but more of a muscle or something else".  Does continue PT for this and is nearing the end of that treatment.  Encouraged her and not isolating and continuing to connect with family members out of town even though she does not drive  that distance much anymore by herself.  Husband continues to have some health issues.  More smiling today. Encourage patient in practicing positive behaviors including: Having more faith in herself and her ability to make changes and live life more fully in the present rather than excessive worrying about the future, recognize her progress that she has already made, practice more consistent positive self talk, refrain from assuming worst-case scenarios, limit her watching of distressing news on TV or on line, have a healthier balance of meaningful and find time to spin with family and friends, refrain from self negating, intentionally look for more positives versus negatives each day, search for the positives within herself, maintain contact with people who are supportive, continue her prescribed medications, and realize the strength she shows working with goal-directed behaviors to move in a direction that supports her improved emotional health and overall outlook.  Goal review and progress/challenges noted with patient.  Next appointment within approximately 5 weeks.  This record has been created using [employer].  Chart creation errors have been sought, but may not always have been located and corrected.  Such creation errors do not reflect on the standard of medical care provided.    Shanon Ace, LCSW

## 2022-08-02 DIAGNOSIS — M48061 Spinal stenosis, lumbar region without neurogenic claudication: Secondary | ICD-10-CM | POA: Diagnosis not present

## 2022-08-02 DIAGNOSIS — M5416 Radiculopathy, lumbar region: Secondary | ICD-10-CM | POA: Diagnosis not present

## 2022-08-02 DIAGNOSIS — M6281 Muscle weakness (generalized): Secondary | ICD-10-CM | POA: Diagnosis not present

## 2022-08-23 DIAGNOSIS — N3941 Urge incontinence: Secondary | ICD-10-CM | POA: Diagnosis not present

## 2022-08-23 DIAGNOSIS — R413 Other amnesia: Secondary | ICD-10-CM | POA: Diagnosis not present

## 2022-08-23 DIAGNOSIS — R251 Tremor, unspecified: Secondary | ICD-10-CM | POA: Diagnosis not present

## 2022-08-23 DIAGNOSIS — N393 Stress incontinence (female) (male): Secondary | ICD-10-CM | POA: Diagnosis not present

## 2022-08-23 DIAGNOSIS — R2689 Other abnormalities of gait and mobility: Secondary | ICD-10-CM | POA: Diagnosis not present

## 2022-08-23 DIAGNOSIS — Z0000 Encounter for general adult medical examination without abnormal findings: Secondary | ICD-10-CM | POA: Diagnosis not present

## 2022-08-23 DIAGNOSIS — L299 Pruritus, unspecified: Secondary | ICD-10-CM | POA: Diagnosis not present

## 2022-08-23 DIAGNOSIS — K649 Unspecified hemorrhoids: Secondary | ICD-10-CM | POA: Diagnosis not present

## 2022-09-13 DIAGNOSIS — H52203 Unspecified astigmatism, bilateral: Secondary | ICD-10-CM | POA: Diagnosis not present

## 2022-09-13 DIAGNOSIS — Z961 Presence of intraocular lens: Secondary | ICD-10-CM | POA: Diagnosis not present

## 2022-09-13 DIAGNOSIS — Z79899 Other long term (current) drug therapy: Secondary | ICD-10-CM | POA: Diagnosis not present

## 2022-09-18 ENCOUNTER — Ambulatory Visit (INDEPENDENT_AMBULATORY_CARE_PROVIDER_SITE_OTHER): Payer: Medicare Other | Admitting: Psychiatry

## 2022-09-18 DIAGNOSIS — F419 Anxiety disorder, unspecified: Secondary | ICD-10-CM | POA: Diagnosis not present

## 2022-09-18 NOTE — Progress Notes (Signed)
[facility] Counselor/Therapist Progress Note  Patient ID: Andrea Burke, MRN: [id number],    Date: 09/18/2022  Time Spent: 55 minutes  Treatment Type: Individual Therapy  Reported Symptoms:  anxiety "main symptom", depression ("thinking end of the year and what have I done with myself") Later states "it's been an ok year".  Mental Status Exam:  Appearance:   Casual and Neat     Behavior:  Appropriate, Sharing, and Motivated  Motor:  Normal  Speech/Language:   Clear and Coherent  Affect:  Depressed and anxious  Mood:  anxious and depressed  Thought process:  goal directed  Thought content:    WNL  Sensory/Perceptual disturbances:    WNL  Orientation:  oriented to person, place, time/date, situation, day of week, month of year, year, and stated date of Dec. 12, 2023  Attention:  Fair  Concentration:  Good and Fair  Memory:  Some occasional short term memory issues, and Dr. Is aw  Fund of knowledge:   Good  Insight:    Good and Fair  Judgment:   Good  Impulse Control:  Good   Risk Assessment: Danger to Self:  No Self-injurious Behavior: No Danger to Others: No Duty to Warn:no Physical Aggression / Violence:No  Access to Firearms a concern: No  Gang Involvement:No   Subjective: Patient today reporting anxiety as her main symtom, and depression. Shares that end of year creates some depression for herself and she questions herself "what have I done with myself this year"? Did share later that the yr was ok but "I didn't complete several projects I had been working on." "Trying to become more proactive in getting chores and other things done" without putting things off. Discussed this in more detail as well as other strategies she can use to motivate herself. Sometimes "uses a reward for herself after completing tasks she normally tends to put off."  Trying to focus more on what I do get done versus what I do not get done. Reports motivation is still up and down and "is  affected by the seasons changing". Back pain better, not constant. Did not have any surgery but did PT. Smiling and laughing some which she notes doesn't always happen in colder weather when her mood is sometimes more down.  Interventions: Cognitive Behavioral Therapy, Solution-Oriented/Positive Psychology, and Ego-Supportive  Long-Term goal: (measurable)  Enhance the ability to handle effectively the full variety of life's anxieties. Patient will eventually rate herself as a "3 or under on a 1-10 scale for depression and for anxiety, for a period of at least 2 months."  Strategies:  Patient will work on re-framing thoughts that have been leading her to feel more anxious.  The re-framed thoughts will support more calmness and personal empowerment for patient.     Short Term goal:  Increase understanding of beliefs and messages that produce worry and anxiety. Strategies: Patient will work with therapist to gain more understanding of how anxiety and worry are supported by certain beliefs and messages.  She will be able to give at least 3 examples of such messages in an upcoming session.   Diagnosis:   ICD-10-CM   1. Anxiety disorder, unspecified type  F41.9      Plan: Patient in today experiencing some anxiety and depression that she does feel can be related to "end of year and dark winter months".  I did recall patient having this previously and we talked about this at length today, looking at what helps  somewhat does not help.  Discussed different activities and things of interest that she might do at home, and also her being able to get out some during the daytime when she feels more safe and secure.  Patient seems to be more proactive currently and recognizing her previous tendency to struggle with some seasonal depression, and looking at ways to take better care of herself, be involved with others, and have activities that she enjoys doing at home, in addition to getting out of the house some  during the day time.  As noted above, frequent smiling today and no tearfulness. Encouraged patient in her practice of positive behaviors as noted in session including: Believing more in herself and her ability to make changes and live life more fully in the present rather than excessively worrying about the future, recognize her progress that she has already made, practice more consistent positive self talk, refrain from assuming worst-case scenarios, limit her watching of distressing news on TV or on line, have a healthier balance of meaningful and fun time to spend with family and friends, refrain from self negating, intentionally look for more positives versus negatives daily, see the positives within herself, maintain contact with people who are supportive, continue her prescribed medications, and recognize the strength she shows when working with goal-directed behaviors to move in a direction that supports her improved emotional health.  Goal review and progress/challenges noted with patient.  Next appointment within 4 to 6 weeks.  This record has been created using [employer].  Chart creation errors have been sought, but may not always have been located and corrected.  Such creation errors do not reflect on the standard of medical care provided.   Shanon Ace, LCSW

## 2022-10-22 ENCOUNTER — Ambulatory Visit: Payer: Medicare Other | Admitting: Psychiatry

## 2022-10-25 ENCOUNTER — Ambulatory Visit (INDEPENDENT_AMBULATORY_CARE_PROVIDER_SITE_OTHER): Payer: Medicare Other | Admitting: Psychiatry

## 2022-10-25 ENCOUNTER — Encounter: Payer: Self-pay | Admitting: Psychiatry

## 2022-10-25 DIAGNOSIS — F330 Major depressive disorder, recurrent, mild: Secondary | ICD-10-CM | POA: Diagnosis not present

## 2022-10-25 DIAGNOSIS — F419 Anxiety disorder, unspecified: Secondary | ICD-10-CM | POA: Diagnosis not present

## 2022-10-25 MED ORDER — DULOXETINE HCL 30 MG PO CPEP: 30.0000 mg | ORAL_CAPSULE | Freq: Every day | ORAL | 1 refills | Status: DC

## 2022-10-25 MED ORDER — BUSPIRONE HCL 15 MG PO TABS: ORAL_TABLET | ORAL | 2 refills | Status: DC

## 2022-10-25 NOTE — Progress Notes (Signed)
FLOREAN Burke [id number] Dec 06, 1945 77 y.o.  Subjective:   Patient ID:  Andrea Burke is a 77 y.o. (DOB 1946/06/19) female.  Chief Complaint:  Chief Complaint  Patient presents with   Depression   Anxiety    HPI Andrea Burke presents to the office today for follow-up of depression and anxiety. She reports that her depression is "there, but it's not making me sad all the time." Denies persistent sad mood. Denies anhedonia. Depression occ interferes with activities and interest.  She reports that some days she has to push herself to get things done. She estimates experiencing anxiety about 40% of the time. Some worry. She reports over-thinking some situations and catastrophizing. She reports that she had some anxiety over the holidays. She is falling asleep without difficulty. She reports that she is usually waking up once a night. She reports that she continues to have dreams- "but they're not scary." She has had dreams about people she knew in the past that have been deceased. She reports that sometimes she does not always feel rested based on the amount of sleep she has had. She reports that her concentration "comes in spurts." She has had difficulty accomplishing New Year's resolution of de-cluttering. Reports snacking without eating meals. Denies SI.   Husband has had a recent cold and has had some minor falls.   Leaving in 2 weeks for a 2 week cruise.   Past medication trials: Remeron- vivid dreams, increased appetite Lexapro-nightmares Sertraline-agitation, insomnia, impaired concentration Paxil-intolerable side effects Fluoxetine-intolerable side effects Viibryd- Partial improvement on 20 mg. Does not want to take doses above 20 mg. Cymbalta Wellbutrin- Severe tremors Lithium Hydroxyzine- has taken for itching.  Xanax Deplin Gabapentin- prescribed for pain  Mini-Mental    Flowsheet Row Office Visit from 11/18/2020 in [place]  Office Visit from 04/25/2020 in [facility]  Total Score (max 30 points ) 29 29        Review of Systems:  Review of Systems  HENT:  Positive for congestion.   Musculoskeletal:  Positive for arthralgias and back pain. Negative for gait problem.  Psychiatric/Behavioral:         Please refer to HPI    Medications: I have reviewed the patient's current medications.  Current Outpatient Medications  Medication Sig Dispense Refill   busPIRone (BUSPAR) 15 MG tablet Take 1/3 tablet p.o. twice daily for 1 week, then take 2/3 tablet p.o. twice daily for 1 week, then take 1 tablet p.o. twice daily 60 tablet 2   Calcium Carbonate-Vitamin D (CALTRATE 600+D PO) Take by mouth.     finasteride (PROSCAR) 5 MG tablet Take 2.5 mg by mouth daily.     fish oil-omega-3 fatty acids 1000 MG capsule Take 2 g by mouth daily.     hydroxychloroquine (PLAQUENIL) 200 MG tablet Take by mouth daily.     Multiple Vitamins-Minerals (CENTRUM PO) Take by mouth.     BUTALBITAL-ACETAMINOPHEN PO Take by mouth as needed.     docusate sodium (COLACE) 100 MG capsule Take 100 mg by mouth daily as needed. (Patient not taking: Reported on 07/23/2022)     DULoxetine (CYMBALTA) 30 MG capsule Take 1 capsule (30 mg total) by mouth daily. 90 capsule 1   ibuprofen (ADVIL) 200 MG tablet Take 200 mg by mouth every 6 (six) hours as needed. (Patient not taking: Reported on 07/23/2022)     ondansetron (ZOFRAN) 4 MG tablet Take 4 mg by mouth every 8 (  eight) hours as needed. (Patient not taking: Reported on 07/23/2022)     No current facility-administered medications for this visit.    Medication Side Effects: Other: Possible dreams, wt gain  Allergies:  Allergies  Allergen Reactions   Doxycycline Calcium    Penicillins     Past Medical History:  Diagnosis Date   Cancer ([place])    KIDNEY   Discoid lupus    Endometriosis    Headache(784.0)    Insomnia    Kidney disease    Lupus ([place])    Osteopenia     Urinary incontinence     Past Medical History, Surgical history, Social history, and Family history were reviewed and updated as appropriate.   Please see review of systems for further details on the patient's review from today.   Objective:   Physical Exam:  There were no vitals taken for this visit.  Physical Exam Constitutional:      General: She is not in acute distress. Musculoskeletal:        General: No deformity.  Neurological:     Mental Status: She is alert and oriented to person, place, and time.     Coordination: Coordination normal.  Psychiatric:        Attention and Perception: Attention and perception normal. She does not perceive auditory or visual hallucinations.        Mood and Affect: Mood is anxious. Affect is not labile, blunt, angry or inappropriate.        Speech: Speech normal.        Behavior: Behavior normal.        Thought Content: Thought content normal. Thought content is not paranoid or delusional. Thought content does not include homicidal or suicidal ideation. Thought content does not include homicidal or suicidal plan.        Cognition and Memory: Cognition and memory normal.        Judgment: Judgment normal.     Comments: Insight intact Dysthymic mood     Lab Review:     Component Value Date/Time   NA 138 07/31/2014 1222   K 4.2 07/31/2014 1222   CL 107 07/31/2014 1222   CO2 24 07/31/2014 1222   GLUCOSE 88 07/31/2014 1222   BUN 11 07/31/2014 1222   CREATININE 0.72 07/31/2014 1222   CALCIUM 9.5 07/31/2014 1222   PROT 7.5 07/31/2014 1222   ALBUMIN 4.3 07/31/2014 1222   AST 21 07/31/2014 1222   ALT 17 07/31/2014 1222   ALKPHOS 54 07/31/2014 1222   BILITOT 0.6 07/31/2014 1222       Component Value Date/Time   WBC 3.7 (L) 09/29/2018 0951   RBC 4.86 09/29/2018 0951   HGB 14.1 09/29/2018 0951   HCT 41.8 09/29/2018 0951   PLT 183 09/29/2018 0951   MCV 86.0 09/29/2018 0951   MCV 86.6 07/31/2014 1222   MCH 29.0 09/29/2018 0951    MCHC 33.7 09/29/2018 0951   RDW 12.2 09/29/2018 0951   LYMPHSABS 1,887 09/29/2018 0951   EOSABS 122 09/29/2018 0951   BASOSABS 30 09/29/2018 0951    No results found for: "POCLITH", "LITHIUM"   No results found for: "PHENYTOIN", "PHENOBARB", "VALPROATE", "CBMZ"   .res Assessment: Plan:   Pt seen for 30 minutes and time spent discussing potential benefits, risks, and side effects of BuSpar for anxiety.  Patient agrees to trial of BuSpar.  Will start BuSpar 15 mg 1/3 tablet twice daily for 1 week, then increase to 2/3 tablet twice daily for 1  week, then increase to 1 tablet twice daily for anxiety. Pt reports that she will wait several weeks to start Buspar since she will be leaving in a couple of weeks for a 2-week cruise and that she usually has some motion sickness for almost a week after returning from a cruise.  Discussed continuing Cymbalta 30 mg daily for anxiety and depression.  Recommend continuing therapy with Rinaldo Cloud, LCSW.  Pt to follow-up in 12 weeks or sooner if clinically indicated.  Patient advised to contact office with any questions, adverse effects, or acute worsening in signs and symptoms.  Andrea Burke was seen today for depression and anxiety.  Diagnoses and all orders for this visit:  Anxiety disorder, unspecified type -     busPIRone (BUSPAR) 15 MG tablet; Take 1/3 tablet p.o. twice daily for 1 week, then take 2/3 tablet p.o. twice daily for 1 week, then take 1 tablet p.o. twice daily -     DULoxetine (CYMBALTA) 30 MG capsule; Take 1 capsule (30 mg total) by mouth daily.  Mild episode of recurrent major depressive disorder (HCC) -     DULoxetine (CYMBALTA) 30 MG capsule; Take 1 capsule (30 mg total) by mouth daily.     Please see After Visit Summary for patient specific instructions.  Future Appointments  Date Time Provider Retreat  10/30/2022  3:00 PM Shanon Ace, LCSW [facility] None  01/14/2023 10:00 AM Thayer Headings, PMHNP [facility] None    No  orders of the defined types were placed in this encounter.   -------------------------------

## 2022-10-30 ENCOUNTER — Ambulatory Visit (INDEPENDENT_AMBULATORY_CARE_PROVIDER_SITE_OTHER): Payer: Medicare Other | Admitting: Psychiatry

## 2022-10-30 DIAGNOSIS — F419 Anxiety disorder, unspecified: Secondary | ICD-10-CM

## 2022-10-30 NOTE — Progress Notes (Signed)
[facility] Counselor/Therapist Progress Note  Patient ID: Andrea Burke, MRN: [id number],    Date: 10/30/2022  Time Spent: 55 minutes   Treatment Type: Individual Therapy  Reported Symptoms:  anxiety, some depression, hard to follow through on tasks sometimes as I get older  Mental Status Exam:  Appearance:   Neat     Behavior:  Appropriate, Sharing, and Motivated  Motor:  Normal  Speech/Language:   Clear and Coherent  Affect:  Anxiety, depression  Mood:  anxious and depressed  Thought process:  goal directed  Thought content:    Rumination  Sensory/Perceptual disturbances:    WNL  Orientation:  oriented to person, place, time/date, situation, day of week, month of year, year, and stated date of Jan. 23, 2024  Attention:  Fair  Concentration:  Fair  Memory:  Some short term memory issues  Fund of knowledge:   Good  Insight:    Good and Fair  Judgment:   Good  Impulse Control:  Good   Risk Assessment: Danger to Self:  No Self-injurious Behavior: No Danger to Others: No Duty to Warn:no Physical Aggression / Violence:No  Access to Firearms a concern: No  Gang Involvement:No   Subjective: Patient in today reporting  anxiety and depression with anxiety being the main symptom. Planning to take a trip next week to celebrate their 50th wedding anniversary. End of past year 2023 she had more depression and struggle which she adds is a difficult time of year usually. "Got through that" and still struggles some with depression and "the cold and wet weather didn't help."Taking naps more often during daytime "but it doesn't seem to affect my nighttime sleep." Less motivated in doing chores at home but I do get them done. Reports her motivation level is about the same as a year ago.Encouraged her in doing more activities including reading, sudoku, and trivia, as well as activities such as walking, some light yard work as she is able and she seems to enjoy being outside when  weather permits. Back pain has decreased some. Reports her mood can be affected by darker and colder weather and discussed ways of accentuating lights in her home and doing more activities and getting out in the daytime.   Interventions: Cognitive Behavioral Therapy and Solution-Oriented/Positive Psychology  Long-Term goal: (measurable)  Enhance the ability to handle effectively the full variety of life's anxieties. Patient will eventually rate herself as a "3 or under on a 1-10 scale for depression and for anxiety, for a period of at least 2 months."  Strategies:  Patient will work on re-framing thoughts that have been leading her to feel more anxious.  The re-framed thoughts will support more calmness and personal empowerment for patient.     Short Term goal:  Increase understanding of beliefs and messages that produce worry and anxiety. Strategies: Patient will work with therapist to gain more understanding of how anxiety and worry are supported by certain beliefs and messages.  She will be able to give at least 3 examples of such messages in an upcoming session.  Diagnosis:   ICD-10-CM   1. Anxiety disorder, unspecified type  F41.9      Plan: Patient today showing active participation and good motivation as she worked on her anxiety and depression, looking at things that contribute to her anxiety and depression as well as things that might help.  The darker in colder weather does impact her mood per patient report so is planning to try  some strategies that can help alleviate the impact of the weather on her mood.  Planning a trip away to celebrate their 50th wedding anniversary over the next couple of weeks and seems excited about that, smiling often.  Encourage patient also to have access to some other things at home that help her work on continuing good memory such as puzzles, reading, and trivia.  Not getting out quite as much in the darker colder months but does try to take advantage of  getting out some when a pretty day comes along with the temperatures being not so cold.  Husband was sick recently but states he is getting much better and expects to be able to go on their anniversary trip.  Review of more self-care strategies for her self including continued contact with family members, being in touch with friends and neighbors, not isolating herself, healthy nutrition, taking meds as prescribed, and seeing some good and every day. Encouraged patient in practicing positive behaviors as noted in session including: Believing in herself more and her ability to make changes and live life more fully in the present rather than excessively worrying about the future, recognize the progress she has already made, rectus more consistent positive self talk, refrain from assuming worst-case scenarios, limit her watching of distressing news on TV or on line, have a healthier balance of meaningful and find time to spend with family and friends, refrain from self negating, intentionally look for more positives versus negatives daily, see the positives within herself, maintain contact with people who are supportive, continue her prescribed medications, and realize the strength she shows when working with goal-directed behaviors to move in a direction that supports her improved emotional health and overall wellbeing.  Goal review and progress/challenges noted with patient.  Next appointment within 5 to 6 weeks.  This record has been created using [employer].  Chart creation errors have been sought, but may not always have been located and corrected.  Such creation errors do not reflect on the standard of medical care provided.    Shanon Ace, LCSW

## 2022-10-30 NOTE — Progress Notes (Deleted)
      [facility] Counselor/Therapist Progress Note  Patient ID: Andrea Burke, MRN: [id number],    Date: 10/30/2022  Time Spent: ***   Treatment Type: {CHL AMB THERAPY TYPES:[id number]}  Reported Symptoms: ***  Mental Status Exam:  Appearance:   {PSY:22683}     Behavior:  {PSY:21022743}  Motor:  {PSY:22302}  Speech/Language:   {PSY:22685}  Affect:  {PSY:22687}  Mood:  {PSY:31886}  Thought process:  {PSY:31888}  Thought content:    {PSY:[phone]}  Sensory/Perceptual disturbances:    {PSY:[phone]}  Orientation:  {PSY:30297}  Attention:  {PSY:22877}  Concentration:  {PSY:(724) 780-2152}  Memory:  {PSY:(979) 003-4706}  Fund of knowledge:   {PSY:(724) 780-2152}  Insight:    {PSY:(724) 780-2152}  Judgment:   {PSY:(724) 780-2152}  Impulse Control:  {PSY:(724) 780-2152}   Risk Assessment: Danger to Self:  {PSY:22692} Self-injurious Behavior: {PSY:22692} Danger to Others: {PSY:22692} Duty to Warn:{PSY:311194} Physical Aggression / Violence:{PSY:21197} Access to Firearms a concern: {PSY:21197} Gang Involvement:{PSY:21197}  Subjective: ***        Interventions: {PSY:949-509-1263}       Diagnosis:No diagnosis found.  Plan: ***       Shanon Ace, LCSW

## 2022-12-19 ENCOUNTER — Ambulatory Visit (INDEPENDENT_AMBULATORY_CARE_PROVIDER_SITE_OTHER): Payer: Medicare Other | Admitting: Psychiatry

## 2022-12-19 DIAGNOSIS — F419 Anxiety disorder, unspecified: Secondary | ICD-10-CM

## 2022-12-19 NOTE — Progress Notes (Unsigned)
[facility] Counselor/Therapist Progress Note  Patient ID: Andrea Burke, MRN: [id number],    Date: 12/19/2022  Time Spent: 45 minutes   Treatment Type: Individual Therapy  Reported Symptoms: anxiety, fears, depression  Mental Status Exam:  Appearance:   Casual and Neat     Behavior:  Appropriate, Sharing, and Motivated  Motor:  Normal  Speech/Language:   Clear and Coherent  Affect:  Depressed and anxiety, fearfulness  Mood:  anxious and depressed  Thought process:  goal directed  Thought content:    WNL  Sensory/Perceptual disturbances:    WNL  Orientation:  oriented to person, place, time/date, situation, day of week, month of year, year, and stated date of December 19, 2011  Attention:  Fair  Concentration:  Fair  Memory:  Some short term memory issues and Dr. Is aware  Fund of knowledge:   Good  Insight:    Fair  Judgment:   Good  Impulse Control:  Good   Risk Assessment: Danger to Self:  No Self-injurious Behavior: No Danger to Others: No Duty to Warn:no Physical Aggression / Violence:No  Access to Firearms a concern: No  Gang Involvement:No   Subjective:  Patient in session today reporting anxiety, depression, some fearfulness re: health and the future. Tearful during earlier part of session as she stated and showed me that she's had more "shakiness and tremors in lower right arm". Experiences more "aches and pains as she ages" and worries about that also. Some short term forgetting at times. Shared her father had to go to nursing home due to health issues that couldn't be managed at home. Reports the fear of her possibly (eventually) having to go to nursing home.  Not seen patient in a while and she needed most of the session today to talk about some of her increased turns especially some of her physical challenges. States that she occasionally takes a nap during the day, but is usually able to sleep that night as well. Still able to do household tasks but some  things are not as easy as they used to be.  Encourage patient in her ongoing family contacts and friends who are supportive, sharing changes that she notices in her health and physical wellbeing with her doctor, and keep staying as active as she can and especially emotionally connected with other people.  She and her husband had scheduled a trip away several months ago and are planning to still take it if possible within a couple of months.  Is looking forward to the spring time of the year when there is more sunlight and warmer temperatures allowing for more enjoyable time outside.  Interventions: Cognitive Behavioral Therapy, Solution-Oriented/Positive Psychology, and Ego-Supportive  Long-Term goal: (measurable)  Enhance the ability to handle effectively the full variety of life's anxieties. Patient will eventually rate herself as a "3 or under on a 1-10 scale for depression and for anxiety, for a period of at least 2 months."  Strategies:  Patient will work on re-framing thoughts that have been leading her to feel more anxious.  The re-framed thoughts will support more calmness and personal empowerment for patient.     Short Term goal:  Increase understanding of beliefs and messages that produce worry and anxiety. Strategies: Patient will work with therapist to gain more understanding of how anxiety and worry are supported by certain beliefs and messages.  She will be able to give at least 3 examples of such messages in an upcoming session.  Diagnosis:   ICD-10-CM   1. Anxiety disorder, unspecified type  F41.9      Plan:   Patient today participating well in session and showing motivation as she worked on her anxiety, fearfulness, depression, all mostly related to her health in the future.  She actually did quite well and talking through her concerns, one of her bigger ones being noticing more shakiness in her right arm, adding that her dad had the same type symptoms in his later years.  States  that she does stay in good contact with her doctor and is good about sharing with him any concerns she has physically and she shared that she has an upcoming appointment with him within a month.  Has progressed over time however is seeing more physical challenges which can certainly impact her mood.  She also enjoys doing puzzles, trivia, and reading which she feels like contributes to staying mentally sharp as possible.  To return in approximately 3 to 4 weeks after another doctor's appointment and her med provider here.Encouraged patient in her practice of positive and self affirming behaviors as noted in session including: Trying to believe more in her ability to make changes and live life more fully in the present rather than excessively worrying about the future, recognize the progress she has already made, use more consistent positive self talk, refrain from assuming worst-case scenarios, limit her watching of distressing news on TV or online, have a healthier balance of meaningful time to spend with family and friends, refrain from self negating, see the positives within herself and each day, maintain contact with people who are supportive, continue her prescribed medications, and recognize the strengths she shows when working with goal-directed behaviors to move in a direction that supports her improved emotional health and outlook.  Self rating scales: 1-10 depression scale-  5 (6?) 1-10 anxiety scale- 5  Goal review and progress/challenges noted with patient.  Next appointment within 6 weeks.  This record has been created using [employer].  Chart creation errors have been sought, but may not always have been located and corrected.  Such creation errors do not reflect on the standard of medical care provided.   Shanon Ace, LCSW

## 2022-12-27 DIAGNOSIS — L932 Other local lupus erythematosus: Secondary | ICD-10-CM | POA: Diagnosis not present

## 2022-12-27 DIAGNOSIS — L299 Pruritus, unspecified: Secondary | ICD-10-CM | POA: Diagnosis not present

## 2022-12-27 DIAGNOSIS — L658 Other specified nonscarring hair loss: Secondary | ICD-10-CM | POA: Diagnosis not present

## 2022-12-27 DIAGNOSIS — L300 Nummular dermatitis: Secondary | ICD-10-CM | POA: Diagnosis not present

## 2022-12-27 DIAGNOSIS — Z79899 Other long term (current) drug therapy: Secondary | ICD-10-CM | POA: Diagnosis not present

## 2023-01-14 ENCOUNTER — Ambulatory Visit (INDEPENDENT_AMBULATORY_CARE_PROVIDER_SITE_OTHER): Payer: Medicare Other | Admitting: Psychiatry

## 2023-01-14 ENCOUNTER — Encounter: Payer: Self-pay | Admitting: Psychiatry

## 2023-01-14 DIAGNOSIS — R413 Other amnesia: Secondary | ICD-10-CM | POA: Diagnosis not present

## 2023-01-14 DIAGNOSIS — F330 Major depressive disorder, recurrent, mild: Secondary | ICD-10-CM | POA: Diagnosis not present

## 2023-01-14 DIAGNOSIS — R251 Tremor, unspecified: Secondary | ICD-10-CM | POA: Diagnosis not present

## 2023-01-14 DIAGNOSIS — F419 Anxiety disorder, unspecified: Secondary | ICD-10-CM | POA: Diagnosis not present

## 2023-01-14 DIAGNOSIS — Z905 Acquired absence of kidney: Secondary | ICD-10-CM | POA: Diagnosis not present

## 2023-01-14 MED ORDER — BUSPIRONE HCL 15 MG PO TABS
ORAL_TABLET | ORAL | 1 refills | Status: DC
Start: 2023-01-14 — End: 2023-02-27

## 2023-01-14 NOTE — Progress Notes (Signed)
Andrea AngelicaGlenda T Harrel [id number] 1946/07/04 77 y.o.  Subjective:   Patient ID:  Andrea Burke is a 77 y.o. (DOB 1946/07/04) female.  Chief Complaint:  Chief Complaint  Patient presents with   Follow-up    Depression, anxiety    Other    Tremor    HPI Andrea Burke presents to the office today for follow-up of depression and anxiety.   She reports worsening tremor. Tremor is worse when she is anxious. She reports that tremor occurs when she is laying in bed and not anxious. She reports, "it starts with a quiver." She reports that tremors primarily affect her right side. She reports a reduced facial expression. She notices she has to be intentional about smiling. She reports that she is talking slower than she used to. She reports that she is spilling her food and drink at times. Has not noticed trouble swallowing. She reports some stiffness and is unsure if this is related to arthritis. She has a diminished sense of smell. Difficulty with concentration and memory. She reports that her tremor seems to have recently worsened. Has apt to see PCP today to address tremors.   She reports that she is "fair" overall. She notices she has been feeling sleepier- "I fall asleep at the drop of a hat." She reports that she is taking brief naps. She reports that some nights she does not stay asleep. She has difficulty returning to sleep sometimes after awakening to use the bathroom. Denies fatigue. She reports that her motivation is low and "it is harder to get started... it's harder to get moving now." She reports sad mood in response to physical issues. She reports a slight increase in anxiety. She reports worry about upcoming trip and wondering how husband will do during upcoming trip. She reports that she initially thought she was experiencing some improvement in anxiety with Buspar and is no longer sure of this. She reports difficulty with concentration and memory. She reports that her weight is not  changing despite weight loss efforts. Denies SI.    Past medication trials: Remeron- vivid dreams, increased appetite Lexapro-nightmares Sertraline-agitation, insomnia, impaired concentration Paxil-intolerable side effects Fluoxetine-intolerable side effects Viibryd- Partial improvement on 20 mg. Does not want to take doses above 20 mg. Cymbalta Wellbutrin- Severe tremors Lithium Hydroxyzine- has taken for itching.  Xanax Deplin Gabapentin- prescribed for pain  Mini-Mental    Flowsheet Row [facility]  Total Score (max 30 points ) 29 29        Review of Systems:  Review of Systems  Musculoskeletal:  Positive for arthralgias and back pain. Negative for gait problem.  Neurological:  Positive for tremors.  Psychiatric/Behavioral:         Please refer to HPI    Medications: I have reviewed the patient's current medications.  Current Outpatient Medications  Medication Sig Dispense Refill   Calcium Carbonate-Vitamin D (CALTRATE 600+D PO) Take by mouth.     Clobetasol Propionate (TEMOVATE) 0.05 % external spray Apply to scalp twice daily x 2 wk, then daily x 2 wk, then every other day x 2 wk. Not to face.     DULoxetine (CYMBALTA) 30 MG capsule Take 1 capsule (30 mg total) by mouth daily. 90 capsule 1   finasteride (PROSCAR) 5 MG tablet Take 2.5 mg by mouth daily.     fish oil-omega-3 fatty acids 1000 MG capsule Take 2  g by mouth daily.     hydroxychloroquine (PLAQUENIL) 200 MG tablet Take by mouth daily.     Multiple Vitamins-Minerals (CENTRUM PO) Take by mouth.     busPIRone (BUSPAR) 15 MG tablet Take 1/2 tablet in the morning and 1 tablet at night 45 tablet 1   BUTALBITAL-ACETAMINOPHEN PO Take by mouth as needed.     clobetasol cream (TEMOVATE) 0.05 % Apply topically.     docusate sodium (COLACE) 100 MG capsule Take 100 mg by mouth daily as  needed. (Patient not taking: Reported on 07/23/2022)     ibuprofen (ADVIL) 200 MG tablet Take 200 mg by mouth every 6 (six) hours as needed. (Patient not taking: Reported on 07/23/2022)     ondansetron (ZOFRAN) 4 MG tablet Take 4 mg by mouth every 8 (eight) hours as needed. (Patient not taking: Reported on 07/23/2022)     No current facility-administered medications for this visit.    Medication Side Effects: Other: Drowsiness  Allergies:  Allergies  Allergen Reactions   Doxycycline Calcium    Penicillins     Past Medical History:  Diagnosis Date   Cancer    KIDNEY   Discoid lupus    Endometriosis    Headache(784.0)    Insomnia    Kidney disease    Lupus    Osteopenia    Urinary incontinence     Past Medical History, Surgical history, Social history, and Family history were reviewed and updated as appropriate.   Please see review of systems for further details on the patient's review from today.   Objective:   Physical Exam:  There were no vitals taken for this visit.  Physical Exam Constitutional:      General: She is not in acute distress. Musculoskeletal:        General: No deformity.  Neurological:     Mental Status: She is alert and oriented to person, place, and time.     Coordination: Coordination normal.  Psychiatric:        Attention and Perception: Attention and perception normal. She does not perceive auditory or visual hallucinations.        Mood and Affect: Affect is not labile, blunt, angry or inappropriate.        Speech: Speech normal.        Behavior: Behavior normal.        Thought Content: Thought content normal. Thought content is not paranoid or delusional. Thought content does not include homicidal or suicidal ideation. Thought content does not include homicidal or suicidal plan.        Cognition and Memory: Cognition and memory normal.        Judgment: Judgment normal.     Comments: Insight intact Mood is anxious and depressed in response  to tremors and evaluation with PCP later today     Lab Review:     Component Value Date/Time   NA 138 07/31/2014 1222   K 4.2 07/31/2014 1222   CL 107 07/31/2014 1222   CO2 24 07/31/2014 1222   GLUCOSE 88 07/31/2014 1222   BUN 11 07/31/2014 1222   CREATININE 0.72 07/31/2014 1222   CALCIUM 9.5 07/31/2014 1222   PROT 7.5 07/31/2014 1222   ALBUMIN 4.3 07/31/2014 1222   AST 21 07/31/2014 1222   ALT 17 07/31/2014 1222   ALKPHOS 54 07/31/2014 1222   BILITOT 0.6 07/31/2014 1222       Component Value Date/Time   WBC 3.7 (L) 09/29/2018 0951   RBC 4.86  09/29/2018 0951   HGB 14.1 09/29/2018 0951   HCT 41.8 09/29/2018 0951   PLT 183 09/29/2018 0951   MCV 86.0 09/29/2018 0951   MCV 86.6 07/31/2014 1222   MCH 29.0 09/29/2018 0951   MCHC 33.7 09/29/2018 0951   RDW 12.2 09/29/2018 0951   LYMPHSABS 1,887 09/29/2018 0951   EOSABS 122 09/29/2018 0951   BASOSABS 30 09/29/2018 0951    No results found for: "POCLITH", "LITHIUM"   No results found for: "PHENYTOIN", "PHENOBARB", "VALPROATE", "CBMZ"   .res Assessment: Plan:    Pt seen for 30 minutes and time spent discussing her concerns about tremor. Discussed that tremor does not seem to be medication related since tremor has been a consistent complaint, both on and off medication, and with different antidepressants. Agreed with plan to follow-up with medical providers regarding evaluation and treatment of tremor as recommended several times in the past. Discussed that this provider would be willing to assist with referrals if needed.  Will decrease Buspar 15 mg to 1/2 tablet (7.5 mg) in the morning to possibly minimize drowsiness and will continue one tablet (15 mg) in the evening for anxiety. Discussed that she could reduce morning dose of Buspar to 5 mg or 1/3 of a 15 mg tab if needed due to drowsiness.  Will continue Cymbalta 30 mg po qd for depression and anxiety since she has experienced worsening mood and anxiety with discontinuation  of Cymbalta in the past.  Recommend continuing therapy with Rockne Menghini, LCSW.   Pt to follow-up with this provider in 6 weeks or sooner if clinically indicated.  Patient advised to contact office with any questions, adverse effects, or acute worsening in signs and symptoms.   Serene was seen today for follow-up and other.  Diagnoses and all orders for this visit:  Anxiety disorder, unspecified type -     busPIRone (BUSPAR) 15 MG tablet; Take 1/2 tablet in the morning and 1 tablet at night  Mild episode of recurrent major depressive disorder     Please see After Visit Summary for patient specific instructions.  Future Appointments  Date Time Provider Department Center  01/22/2023  4:00 PM Mathis Fare, LCSW [facility] None  02/27/2023  1:45 PM Corie Chiquito, PMHNP [facility] None    No orders of the defined types were placed in this encounter.   -------------------------------

## 2023-01-22 ENCOUNTER — Ambulatory Visit (INDEPENDENT_AMBULATORY_CARE_PROVIDER_SITE_OTHER): Payer: Medicare Other | Admitting: Psychiatry

## 2023-01-22 DIAGNOSIS — F419 Anxiety disorder, unspecified: Secondary | ICD-10-CM | POA: Diagnosis not present

## 2023-01-22 NOTE — Progress Notes (Signed)
[facility] Counselor/Therapist Progress Note  Patient ID: Andrea Burke, MRN: [id number],    Date: 01/22/2023  Time Spent: 55 minutes   Treatment Type: Individual Therapy  Reported Symptoms: anxiety, some depression, tearfulness, worries about her tremor and whether she "might have Alzheimers"  Mental Status Exam:  Appearance:   Casual and Neat     Behavior:  Appropriate and Sharing  Motor:  Affected some by a tremor  Speech/Language:   Clear and Coherent  Affect:  Depressed and anxious  Mood:  Anxious and some depression  Thought process:  goal directed  Thought content:    Rumination  Sensory/Perceptual disturbances:    WNL  Orientation:  oriented to person, place, time/date, situation, day of week, month of year, year, and stated date of January 22, 2023  Attention:  Good  Concentration:  Good and Fair  Memory:  Some short term memory issues and her Dr is aware and scheduled her for testing  Fund of knowledge:   Good and Fair  Insight:    Good and Fair  Judgment:   Good  Impulse Control:  Good   Risk Assessment: Danger to Self:  No Self-injurious Behavior: No Danger to Others: No Duty to Warn:no Physical Aggression / Violence:No  Access to Firearms a concern: No  Gang Involvement:No   Subjective:   Patient in session today reporting anxiety, depression, and feeling some aches and pains from some yard and house work. Reports being concerned about tremor in her right hand/arm and is to get some testing done in Sept. 2024. Admits she has been assuming the worst and tearfully processed this more thoroughly in session today. Seemed to gain some insight from this and acknowledged at session end that she needs to "tell myself that this is just first steps right now in getting checked out and not be worrying and thinking the worst". Worked on this more with patient today and also shared a couple of helpful handouts that patient particularly related to in reference to  self-care and coping with challenges. Smiling some by end of session and was able to think of  activities she can be involved in that can help her in coping right now, including being out in her yard some and chatting with close neighbors. Will see again within  Interventions: Cognitive Behavioral Therapy, Solution-Oriented/Positive Psychology, and Ego-Supportive  Long-Term goal: (measurable)  Enhance the ability to handle effectively the full variety of life's anxieties. Patient will eventually rate herself as a "3 or under on a 1-10 scale for depression and for anxiety, for a period of at least 2 months."  Strategies:  Patient will work on re-framing thoughts that have been leading her to feel more anxious.  The re-framed thoughts will support more calmness and personal empowerment for patient.     Short Term goal:  Increase understanding of beliefs and messages that produce worry and anxiety. Strategies: Patient will work with therapist to gain more understanding of how anxiety and worry are supported by certain beliefs and messages.  She will be able to give at least 3 examples of such messages in an upcoming session.  Diagnosis:   ICD-10-CM   1. Anxiety disorder, unspecified type  F41.9      Plan:   Patient motivated and participating well in session as she worked further today on her anxiety, depression, stress, and excessive worrying "and assuming the worst" in regards to health issue re: tremor as stated above. Denies any SI. Patient  having difficult time trying to not jump ahead with negative assumptions but did make some gains in session in regards to her coping with uncertainties. Needs to continue working with goal-directed behaviors to move in a forward direction, and is committed to doing this between sessions. Does have a family trip planned within 1-2 weeks and hopes to be able to go and enjoy it. Continues other activities such as puzzles, reading, and trivia which she feels help  her in mental sharpness. Encouraged patient and practicing more positive and self affirming behaviors as noted in session including: Trying to believe more in her ability to make changes and live life more fully in the present rather than excessively worrying about the future, recognize the progress she has already made, use more consistent positive self talk, refrain from assuming worst-case scenarios, limit her watching of distressing news on TV or online, have a healthier balance of meaningful time to spend with family and friends, refrain from self and again being, see the positives within herself and each day, maintain contact with people who are supportive, continue her prescribed medications, and realize the strength she shows working with goal-directed behaviors to move in a direction that supports her improved emotional health and overall wellbeing.  Self rating scales: 1-10 depression scale- 6/7 1-10 anxiety scale- 6/7  Goal review and progress/challenges noted with patient.  Next appointment within 5 weeks.  This record has been created using [employer].  Chart creation errors have been sought, but may not always have been located and corrected.  Such creation errors do not reflect on the standard of medical care provided.   Mathis Fare, LCSW

## 2023-02-21 DIAGNOSIS — R7989 Other specified abnormal findings of blood chemistry: Secondary | ICD-10-CM | POA: Diagnosis not present

## 2023-02-21 DIAGNOSIS — F321 Major depressive disorder, single episode, moderate: Secondary | ICD-10-CM | POA: Diagnosis not present

## 2023-02-21 DIAGNOSIS — M85851 Other specified disorders of bone density and structure, right thigh: Secondary | ICD-10-CM | POA: Diagnosis not present

## 2023-02-21 DIAGNOSIS — R29818 Other symptoms and signs involving the nervous system: Secondary | ICD-10-CM | POA: Diagnosis not present

## 2023-02-21 DIAGNOSIS — Z0000 Encounter for general adult medical examination without abnormal findings: Secondary | ICD-10-CM | POA: Diagnosis not present

## 2023-02-21 DIAGNOSIS — Z79899 Other long term (current) drug therapy: Secondary | ICD-10-CM | POA: Diagnosis not present

## 2023-02-21 DIAGNOSIS — R2689 Other abnormalities of gait and mobility: Secondary | ICD-10-CM | POA: Diagnosis not present

## 2023-02-21 DIAGNOSIS — M85852 Other specified disorders of bone density and structure, left thigh: Secondary | ICD-10-CM | POA: Diagnosis not present

## 2023-02-21 DIAGNOSIS — E785 Hyperlipidemia, unspecified: Secondary | ICD-10-CM | POA: Diagnosis not present

## 2023-02-21 DIAGNOSIS — D84821 Immunodeficiency due to drugs: Secondary | ICD-10-CM | POA: Diagnosis not present

## 2023-02-26 ENCOUNTER — Emergency Department (HOSPITAL_COMMUNITY): Payer: Medicare Other

## 2023-02-26 ENCOUNTER — Emergency Department (HOSPITAL_COMMUNITY)
Admission: EM | Admit: 2023-02-26 | Discharge: 2023-02-26 | Disposition: A | Discharge status: Home / Self Care | Payer: Medicare Other | Attending: Emergency Medicine | Admitting: Emergency Medicine

## 2023-02-26 ENCOUNTER — Other Ambulatory Visit: Payer: Self-pay

## 2023-02-26 ENCOUNTER — Encounter (HOSPITAL_COMMUNITY): Payer: Self-pay | Admitting: Emergency Medicine

## 2023-02-26 DIAGNOSIS — I10 Essential (primary) hypertension: Secondary | ICD-10-CM | POA: Diagnosis not present

## 2023-02-26 DIAGNOSIS — R519 Headache, unspecified: Secondary | ICD-10-CM

## 2023-02-26 DIAGNOSIS — Z85520 Personal history of malignant carcinoid tumor of kidney: Secondary | ICD-10-CM | POA: Insufficient documentation

## 2023-02-26 DIAGNOSIS — G4489 Other headache syndrome: Secondary | ICD-10-CM | POA: Diagnosis not present

## 2023-02-26 DIAGNOSIS — R42 Dizziness and giddiness: Secondary | ICD-10-CM

## 2023-02-26 DIAGNOSIS — R410 Disorientation, unspecified: Secondary | ICD-10-CM | POA: Diagnosis not present

## 2023-02-26 DIAGNOSIS — R110 Nausea: Secondary | ICD-10-CM | POA: Diagnosis not present

## 2023-02-26 LAB — CBC
HCT: 39.9 % (ref 36.0–46.0)
Hemoglobin: 13.2 g/dL (ref 12.0–15.0)
MCH: 28.3 pg (ref 26.0–34.0)
MCHC: 33.1 g/dL (ref 30.0–36.0)
MCV: 85.6 fL (ref 80.0–100.0)
Platelets: 169 10*3/uL (ref 150–400)
RBC: 4.66 MIL/uL (ref 3.87–5.11)
RDW: 12.7 % (ref 11.5–15.5)
WBC: 3.6 10*3/uL — ABNORMAL LOW (ref 4.0–10.5)
nRBC: 0 % (ref 0.0–0.2)

## 2023-02-26 LAB — BASIC METABOLIC PANEL
Anion gap: 7 (ref 5–15)
BUN: 14 mg/dL (ref 8–23)
CO2: 22 mmol/L (ref 22–32)
Calcium: 9.1 mg/dL (ref 8.9–10.3)
Chloride: 103 mmol/L (ref 98–111)
Creatinine, Ser: 0.81 mg/dL (ref 0.44–1.00)
GFR, Estimated: >60 mL/min (ref >60)
Glucose, Bld: 127 mg/dL — ABNORMAL HIGH (ref 70–99)
Potassium: 4 mmol/L (ref 3.5–5.1)
Sodium: 132 mmol/L — ABNORMAL LOW (ref 135–145)

## 2023-02-26 MED ORDER — ONDANSETRON 4 MG PO TBDP: 4.0000 mg | ORAL_TABLET | Freq: Three times a day (TID) | ORAL | 0 refills | Status: AC | PRN

## 2023-02-26 MED ORDER — MECLIZINE HCL 12.5 MG PO TABS: 12.5000 mg | ORAL_TABLET | Freq: Three times a day (TID) | ORAL | 0 refills | Status: DC | PRN

## 2023-02-26 MED ORDER — PROCHLORPERAZINE EDISYLATE 10 MG/2ML IJ SOLN
10.0000 mg | Freq: Once | INTRAMUSCULAR | Status: AC
  Administered 2023-02-26: 10 mg via INTRAVENOUS
  Filled 2023-02-26: qty 2

## 2023-02-26 MED ORDER — SODIUM CHLORIDE 0.9 % IV BOLUS
1000.0000 mL | Freq: Once | INTRAVENOUS | Status: AC
  Administered 2023-02-26: 1000 mL via INTRAVENOUS

## 2023-02-26 NOTE — ED Triage Notes (Addendum)
Pt in via [facility] with headache, dizziness and nausea that began when she went to bed around 10pm. Pt states she had to go to the bathroom at 0400, woke up with worsened HA, and the room was spinning when she got up. +nausea and emesis x 1 PTA. Given 4mg  Zofran, has 18G LAC. Hx of migraines, states this is worse than her normal, has taken no meds for pain. Has tremor to R arm, being followed by PCP, but states tonight this is also worse. MAE's equally, speech clear, a&ox4 VS en route:  142/90 87HR 18 98%RA CBG 132

## 2023-02-26 NOTE — ED Provider Notes (Signed)
[place] EMERGENCY DEPARTMENT AT [facility] Provider Note  CSN: [id number] Arrival date & time: 02/26/23 0456  Chief Complaint(s) Dizziness and Headache ED Triage Notes Lucinda Dell, RN (Registered Nurse)  Emergency Medicine  Date of Service: 02/26/2023  4:57 AM  Addendum Pt in via [facility] with headache, dizziness and nausea that began when she went to bed around 10pm. Pt states she had to go to the bathroom at 0400, woke up with worsened HA, and the room was spinning when she got up. +nausea and emesis x 1 PTA. Given 4mg  Zofran, has 18G LAC. Hx of migraines, states this is worse than her normal, has taken no meds for pain. Has tremor to R arm, being followed by PCP, but states tonight this is also worse. MAE's equally, speech clear, a&ox4     HPI Andrea Burke is a 77 y.o. female with a past medical history listed below who presents to the emergency department with an episode of dizziness and vertigo after getting up from bed around 4 AM this morning.  She does not remember all the events.  Patient reports feeling extremely dizzy with the room spinning.  She reports feeling flushed and extremely nauseated and having episode of emesis.  She has associated frontal headache.  Reports prior history of migraines but has not had any significant headaches recently.  She denies any focal deficits, visual changes.  She does report several months of right arm tremor which is being worked up by the PCP.  She has been referred to a neurologist for evaluation.  Patient also has a history of motion sickness and was previously prescribed scopolamine.  Reports that this feels like a seasickness episode.  The history is provided by the patient.    Past Medical History Past Medical History:  Diagnosis Date   Cancer (HCC)    KIDNEY   Discoid lupus    Endometriosis    Headache(784.0)    Insomnia    Kidney disease    Lupus (HCC)    Osteopenia    Urinary incontinence    Patient Active  Problem List   Diagnosis Date Noted   Major depressive disorder, single episode, moderate (HCC) 08/06/2018   Moderate major depression (HCC) 06/23/2018   Anxiety disorder 06/23/2018   Pruritus 04/01/2017   Onychomycosis 05/12/2012   Lentigines 05/12/2012   Discoid lupus    Cancer (HCC)    Headache(784.0)    Insomnia    Endometriosis    Osteopenia    Home Medication(s) Prior to Admission medications   Medication Sig Start Date End Date Taking? Authorizing Provider  meclizine (ANTIVERT) 12.5 MG tablet Take 1 tablet (12.5 mg total) by mouth 3 (three) times daily as needed for dizziness. 02/26/23  Yes Madissen Wyse, Amadeo Garnet, MD  ondansetron (ZOFRAN-ODT) 4 MG disintegrating tablet Take 1 tablet (4 mg total) by mouth every 8 (eight) hours as needed for up to 3 days for nausea or vomiting. 02/26/23 03/01/23 Yes Kelvis Berger, Amadeo Garnet, MD  busPIRone (BUSPAR) 15 MG tablet Take 1/2 tablet in the morning and 1 tablet at night 01/14/23   Corie Chiquito, PMHNP  BUTALBITAL-ACETAMINOPHEN PO Take by mouth as needed.    [provider]  Calcium Carbonate-Vitamin D (CALTRATE 600+D PO) Take by mouth.    [provider]  clobetasol cream (TEMOVATE) 0.05 % Apply topically.    [provider]  Clobetasol Propionate (TEMOVATE) 0.05 % external spray Apply to scalp twice daily x 2 wk, then daily x 2  wk, then every other day x 2 wk. Not to face. 04/13/19   [provider]  docusate sodium (COLACE) 100 MG capsule Take 100 mg by mouth daily as needed. Patient not taking: Reported on 07/23/2022    [provider]  DULoxetine (CYMBALTA) 30 MG capsule Take 1 capsule (30 mg total) by mouth daily. 10/25/22   Corie Chiquito, PMHNP  finasteride (PROSCAR) 5 MG tablet Take 2.5 mg by mouth daily.    [provider]  fish oil-omega-3 fatty acids 1000 MG capsule Take 2 g by mouth daily.    [provider]  hydroxychloroquine (PLAQUENIL) 200 MG tablet Take by mouth  daily.    [provider]  ibuprofen (ADVIL) 200 MG tablet Take 200 mg by mouth every 6 (six) hours as needed. Patient not taking: Reported on 07/23/2022    [provider]  Multiple Vitamins-Minerals (CENTRUM PO) Take by mouth.    [provider]  ondansetron (ZOFRAN) 4 MG tablet Take 4 mg by mouth every 8 (eight) hours as needed. Patient not taking: Reported on 07/23/2022 05/01/22   [provider]                                                                                                                                    Allergies Doxycycline calcium and Penicillins  Review of Systems Review of Systems As noted in HPI  Physical Exam Vital Signs  I have reviewed the triage vital signs BP 138/72   Pulse 67   Temp 98.3 F (36.8 C) (Oral)   Resp 15   Wt 65.8 kg   SpO2 100%   BMI 28.32 kg/m   Physical Exam Vitals reviewed.  Constitutional:      General: She is not in acute distress.    Appearance: She is well-developed. She is not diaphoretic.  HENT:     Head: Normocephalic and atraumatic.     Right Ear: External ear normal.     Left Ear: External ear normal.     Nose: Nose normal.  Eyes:     General: No scleral icterus.    Conjunctiva/sclera: Conjunctivae normal.  Neck:     Trachea: Phonation normal.  Cardiovascular:     Rate and Rhythm: Normal rate and regular rhythm.  Pulmonary:     Effort: Pulmonary effort is normal. No respiratory distress.     Breath sounds: No stridor.  Abdominal:     General: There is no distension.  Musculoskeletal:        General: Normal range of motion.     Cervical back: Normal range of motion.  Neurological:     Mental Status: She is alert and oriented to person, place, and time.     Comments: Mental Status:  Alert and oriented to person, place, and time.  Attention and concentration normal.  Speech clear.   Cranial Nerves:  II Visual Fields: Intact to confrontation. Visual  fields  intact. III, IV, VI: Pupils equal and reactive to light and near. Full eye movement without nystagmus  V Facial Sensation: Normal. No weakness of masticatory muscles  VII: No facial weakness or asymmetry  VIII Auditory Acuity: Grossly normal  IX/X: The uvula is midline; the palate elevates symmetrically  XI: Normal sternocleidomastoid and trapezius strength  XII: The tongue is midline. No atrophy or fasciculations.   Motor System: Muscle Strength: 5/5 and symmetric in the upper and lower extremities. No pronation or drift.  Muscle Tone: Tone and muscle bulk are normal in the upper and lower extremities.  Reflexes: No Clonus Coordination: Intact finger-to-nose. RUE tremor at rest, intermittent, ceases when distracted.  Sensation: Intact to light touch. Gait: deferred   Psychiatric:        Behavior: Behavior normal.     ED Results and Treatments Labs (all labs ordered are listed, but only abnormal results are displayed) Labs Reviewed  BASIC METABOLIC PANEL - Abnormal; Notable for the following components:      Result Value   Sodium 132 (*)    Glucose, Bld 127 (*)    All other components within normal limits  CBC - Abnormal; Notable for the following components:   WBC 3.6 (*)    All other components within normal limits  CBG MONITORING, ED                                                                                                                         EKG  EKG Interpretation  Date/Time:  Tuesday Feb 26 2023 06:44:22 EDT Ventricular Rate:  72 PR Interval:  190 QRS Duration: 110 QT Interval:  420 QTC Calculation: 460 R Axis:   47 Text Interpretation: Sinus rhythm Consider left atrial enlargement Inferior infarct, old No significant change was found Confirmed by Drema Pry [phone]) on 02/26/2023 7:10:12 AM       Radiology CT Head Wo Contrast  Result Date: 02/26/2023 CLINICAL DATA:  Headache with increasing frequency or severity. EXAM: CT HEAD WITHOUT CONTRAST  TECHNIQUE: Contiguous axial images were obtained from the base of the skull through the vertex without intravenous contrast. RADIATION DOSE REDUCTION: This exam was performed according to the departmental dose-optimization program which includes automated exposure control, adjustment of the mA and/or kV according to patient size and/or use of iterative reconstruction technique. COMPARISON:  None Available. FINDINGS: Brain: No evidence of acute infarction, hemorrhage, hydrocephalus, extra-axial collection or mass lesion/mass effect. Mild for age cerebral volume loss Vascular: No hyperdense vessel or unexpected calcification. Skull: Normal. Negative for fracture or focal lesion. Sinuses/Orbits: No acute finding. IMPRESSION: No acute or reversible finding. Electronically Signed   By: Tiburcio Pea M.D.   On: 02/26/2023 06:47    Medications Ordered in ED Medications  prochlorperazine (COMPAZINE) injection 10 mg (10 mg Intravenous Given 02/26/23 0616)  sodium chloride 0.9 % bolus 1,000 mL (0 mLs Intravenous Stopped 02/26/23 0736)   Procedures Procedures  (including critical care time) Medical Decision Making /  ED Course  Click here for ABCD2, HEART and other calculators  Medical Decision Making Amount and/or Complexity of Data Reviewed Labs: ordered. Radiology: ordered.  Risk Prescription drug management.    The patient presents to the ED for the following: Headache with vertigo  This involves an extensive number of treatment options, and is a complaint that carries with it a high risk of complications and morbidity. The differential diagnosis includes but not limited to:  Migraine equivalent, vertigo, CVA, dysrhythmia, electrolyte derangement, anemia, orthostasis  Initial intervention:  Compazine IVF  Work up Interpretation and Management:  Cardiac Monitoring/EKG: EKG without acute ischemic changes, dysrhythmias or blocks  Laboratory Tests ordered listed below with my independent  interpretation: CBC without leukocytosis or anemia Metabolic panel with mild hyponatremia.  No other significant electrolyte derangements or renal sufficiency.  Mild hyperglycemia without evidence of DKA.    Imaging Studies ordered listed below with my independent interpretation: CT head negative for ICH, mass effect or old strokes.  ED Course: Significant relief after compazine. No longer feeling vertiginous. Ambulated well. Presentation favored to be migraine vs vertigo. Given improvement following treatment, CVA less likely.      Final Clinical Impression(s) / ED Diagnoses Final diagnoses:  Dizziness  Headache disorder   The patient appears reasonably screened and/or stabilized for discharge and I doubt any other medical condition or other [facility] requiring further screening, evaluation, or treatment in the ED at this time. I have discussed the findings, Dx and Tx plan with the patient/family who expressed understanding and agree(s) with the plan. Discharge instructions discussed at length. The patient/family was given strict return precautions who verbalized understanding of the instructions. No further questions at time of discharge.  Disposition: Discharge  Condition: Good  ED Discharge Orders          Ordered    ondansetron (ZOFRAN-ODT) 4 MG disintegrating tablet  Every 8 hours PRN        02/26/23 0729    meclizine (ANTIVERT) 12.5 MG tablet  3 times daily PRN        02/26/23 1610             Follow Up: Catha Gosselin, MD [address] [place] [address] [phone]  Call  to schedule an appointment for close follow up           This chart was dictated using voice recognition software.  Despite best efforts to proofread,  errors can occur which can change the documentation meaning.    Nira Conn, MD 02/26/23 2105

## 2023-02-26 NOTE — ED Notes (Signed)
Patient Alert and oriented to baseline. Stable and ambulatory to baseline. Patient verbalized understanding of the discharge instructions.  Patient belongings were taken by the patient.   

## 2023-02-27 ENCOUNTER — Ambulatory Visit (INDEPENDENT_AMBULATORY_CARE_PROVIDER_SITE_OTHER): Payer: Medicare Other | Admitting: Psychiatry

## 2023-02-27 ENCOUNTER — Encounter: Payer: Self-pay | Admitting: Psychiatry

## 2023-02-27 DIAGNOSIS — F330 Major depressive disorder, recurrent, mild: Secondary | ICD-10-CM

## 2023-02-27 DIAGNOSIS — F419 Anxiety disorder, unspecified: Secondary | ICD-10-CM

## 2023-02-27 MED ORDER — DULOXETINE HCL 30 MG PO CPEP: 30.0000 mg | ORAL_CAPSULE | Freq: Every day | ORAL | 1 refills | Status: DC

## 2023-02-27 NOTE — Progress Notes (Signed)
Andrea Burke [id number] 1945-11-01 77 y.o.  Subjective:   Patient ID:  Andrea Burke is a 77 y.o. (DOB Nov 15, 1945) female.  Chief Complaint:  Chief Complaint  Patient presents with   Depression   Anxiety    HPI Andrea Burke presents to the office today for follow-up of depression and anxiety.   She reports she is, "not good." Has had a cough and cold symptoms since last Monday. She reports that her symptoms worsened last week. She reports, "my shaking also got worse." She reports feeling "achy and hurting all over" this Monday after doing some yard work. She reports that she then got hot, "my mind was racing... didn't know where I was." She reports that, "I couldn't control my urine" and had incontinence. Reports urinary frequency. She reports that she felt, "out of my body." She reports that she then had severe dizziness and she awakened her husband, who called EMS. She has an apt tomorrow with her PCP. She has an apt with neurologist in September. She reports that her tremor "is now more constant during the day instead of once and awhile... it goes from small amounts to really jerky."   She reports that she feels "jittery." She reports feeling anxious and worried. She reports that her husband commented she was "hovering over" him during their cruise. She reports that she had fears of him getting hurt, falling, or having a health issue while they were traveling (husband has fallen during trips in the past). She reports having some catastrophic thoughts. She reports rumination. She reports that tremor is exacerbated by anxiety, however tremor can happen "out of no where" when she is not anxious, such as resting at home. She reports increased difficulty with memory. She reports that her mood was ok until PCP mentioned the possibility of Parkinson's disease- "just the thought of more things happening." She reports fearing that she may not be able to care for husband if her health  declines- "it's just the 2 of [place]" and reports limited support.   She reports that she has been sleeping more since not feeling well. Low energy and motivation. Appetite has been "not good." She reports that food on cruise was "not enticing." She reports difficulty with concentration. She repots some passive death wishes. Denies SI.   Father had Parkinson's disease. Mother is 18 yo.   She reports that she continues to feel "real weak and jittery" today. She reports that she did not take any of her medications today.   She reports that she has not seen a significant improvement with Buspar and has not been taking it consistently. She last took it 02/24/23.   Past medication trials: Remeron- vivid dreams, increased appetite Lexapro-nightmares Sertraline-agitation, insomnia, impaired concentration Paxil-intolerable side effects Fluoxetine-intolerable side effects Viibryd- Partial improvement on 20 mg. Does not want to take doses above 20 mg. Cymbalta Wellbutrin- Severe tremors Lithium Hydroxyzine- has taken for itching.  Xanax Deplin Gabapentin- prescribed for pain   Mini-Mental    Flowsheet Row Office Visit from 11/18/2020 in [facility] Office Visit from 04/25/2020 in [facility]  Total Score (max 30 points ) 29 29      Flowsheet Row ED from 02/26/2023 in [facility] Emergency Department at [facility]  C-SSRS RISK CATEGORY No Risk        Review of Systems:  Review of Systems  Respiratory:  Positive for cough.   Musculoskeletal:  Negative for gait problem.  Neurological:  Positive for tremors.  Psychiatric/Behavioral:         Please refer to HPI    Medications: I have reviewed the patient's current medications.  Current Outpatient Medications  Medication Sig Dispense Refill   Calcium Carbonate-Vitamin D (CALTRATE 600+D PO) Take by mouth.     clobetasol cream (TEMOVATE) 0.05 % Apply topically.      Clobetasol Propionate (TEMOVATE) 0.05 % external spray Apply to scalp twice daily x 2 wk, then daily x 2 wk, then every other day x 2 wk. Not to face.     dextromethorphan-guaiFENesin (MUCINEX DM) 30-600 MG 12hr tablet Take 1 tablet by mouth 2 (two) times daily.     finasteride (PROSCAR) 5 MG tablet Take 2.5 mg by mouth daily.     fish oil-omega-3 fatty acids 1000 MG capsule Take 2 g by mouth daily.     hydroxychloroquine (PLAQUENIL) 200 MG tablet Take by mouth daily.     BUTALBITAL-ACETAMINOPHEN PO Take by mouth as needed.     docusate sodium (COLACE) 100 MG capsule Take 100 mg by mouth daily as needed. (Patient not taking: Reported on 07/23/2022)     DULoxetine (CYMBALTA) 30 MG capsule Take 1 capsule (30 mg total) by mouth daily. 90 capsule 1   ibuprofen (ADVIL) 200 MG tablet Take 200 mg by mouth every 6 (six) hours as needed. (Patient not taking: Reported on 07/23/2022)     meclizine (ANTIVERT) 12.5 MG tablet Take 1 tablet (12.5 mg total) by mouth 3 (three) times daily as needed for dizziness. (Patient not taking: Reported on 02/27/2023) 30 tablet 0   Multiple Vitamins-Minerals (CENTRUM PO) Take by mouth. (Patient not taking: Reported on 02/27/2023)     ondansetron (ZOFRAN) 4 MG tablet Take 4 mg by mouth every 8 (eight) hours as needed. (Patient not taking: Reported on 07/23/2022)     ondansetron (ZOFRAN-ODT) 4 MG disintegrating tablet Take 1 tablet (4 mg total) by mouth every 8 (eight) hours as needed for up to 3 days for nausea or vomiting. (Patient not taking: Reported on 02/27/2023) 15 tablet 0   No current facility-administered medications for this visit.    Medication Side Effects: None  Allergies:  Allergies  Allergen Reactions   Doxycycline Calcium    Penicillins     Past Medical History:  Diagnosis Date   Cancer (HCC)    KIDNEY   Discoid lupus    Endometriosis    Headache(784.0)    Insomnia    Kidney disease    Lupus (HCC)    Osteopenia    Urinary incontinence      Past Medical History, Surgical history, Social history, and Family history were reviewed and updated as appropriate.   Please see review of systems for further details on the patient's review from today.   Objective:   Physical Exam:  There were no vitals taken for this visit.  Physical Exam Constitutional:      General: She is not in acute distress. Musculoskeletal:        General: No deformity.  Neurological:     Mental Status: She is alert and oriented to person, place, and time.     Motor: Tremor present.     Coordination: Coordination normal.  Psychiatric:        Attention and Perception: Attention and perception normal. She does not perceive auditory or visual hallucinations.        Mood and Affect: Mood is anxious and depressed. Affect is tearful. Affect is not labile,  blunt, angry or inappropriate.        Speech: Speech normal.        Behavior: Behavior normal.        Thought Content: Thought content normal. Thought content is not paranoid or delusional. Thought content does not include homicidal or suicidal ideation. Thought content does not include homicidal or suicidal plan.        Cognition and Memory: Cognition and memory normal.        Judgment: Judgment normal.     Comments: Insight intact     Lab Review:     Component Value Date/Time   NA 132 (L) 02/26/2023 0515   K 4.0 02/26/2023 0515   CL 103 02/26/2023 0515   CO2 22 02/26/2023 0515   GLUCOSE 127 (H) 02/26/2023 0515   BUN 14 02/26/2023 0515   CREATININE 0.81 02/26/2023 0515   CREATININE 0.72 07/31/2014 1222   CALCIUM 9.1 02/26/2023 0515   PROT 7.5 07/31/2014 1222   ALBUMIN 4.3 07/31/2014 1222   AST 21 07/31/2014 1222   ALT 17 07/31/2014 1222   ALKPHOS 54 07/31/2014 1222   BILITOT 0.6 07/31/2014 1222   GFRNONAA >60 02/26/2023 0515       Component Value Date/Time   WBC 3.6 (L) 02/26/2023 0515   RBC 4.66 02/26/2023 0515   HGB 13.2 02/26/2023 0515   HCT 39.9 02/26/2023 0515   PLT 169  02/26/2023 0515   MCV 85.6 02/26/2023 0515   MCV 86.6 07/31/2014 1222   MCH 28.3 02/26/2023 0515   MCHC 33.1 02/26/2023 0515   RDW 12.7 02/26/2023 0515   LYMPHSABS 1,887 09/29/2018 0951   EOSABS 122 09/29/2018 0951   BASOSABS 30 09/29/2018 0951    No results found for: "POCLITH", "LITHIUM"   No results found for: "PHENYTOIN", "PHENOBARB", "VALPROATE", "CBMZ"   .res Assessment: Plan:   Recommended resuming Cymbalta to prevent discontinuation s/s after not taking Cymbalta yesterday.  Will discontinue Buspar since benefit seems to be limited. Discussed that dizziness can be a possible side effect of Buspar, however she did not take Buspar within a couple of days of episode of dizziness and therefore recent dizziness is likely not related to Buspar. Discussed that she does not need to taper Buspar since she has not taken it in several days.  Agree with plan to follow-up with PCP tomorrow regarding recent medical issues. Agree with plan to consult neurology. Discussed that this provider could also initiate or assist with a referral to [facility] neurology if needed.  Recommend continuing therapy with Rockne Menghini, LCSW.  Pt to follow-up with this provider in 4 weeks or sooner if clinically indicated.  Patient advised to contact office with any questions, adverse effects, or acute worsening in signs and symptoms.   I spent 35 minutes dedicated to the care of this patient on the date of this  encounter to include pre-visit review of records, face-to-face time with the patient discussing how medical issues may be affecting psychiatric symptoms, ordering of medication, and post visit documentation.    Cherub was seen today for depression and anxiety.  Diagnoses and all orders for this visit:  Mild episode of recurrent major depressive disorder (HCC) -     DULoxetine (CYMBALTA) 30 MG capsule; Take 1 capsule (30 mg total) by mouth daily.  Anxiety disorder, unspecified type -     DULoxetine  (CYMBALTA) 30 MG capsule; Take 1 capsule (30 mg total) by mouth daily.     Please see After Visit Summary for  patient specific instructions.  Future Appointments  Date Time Provider Department Center  03/05/2023  4:00 PM Mathis Fare, [place] CP-CP None  04/04/2023  2:00 PM Corie Chiquito, PMHNP CP-CP None    No orders of the defined types were placed in this encounter.   -------------------------------

## 2023-02-28 DIAGNOSIS — R7989 Other specified abnormal findings of blood chemistry: Secondary | ICD-10-CM | POA: Diagnosis not present

## 2023-02-28 DIAGNOSIS — R251 Tremor, unspecified: Secondary | ICD-10-CM | POA: Diagnosis not present

## 2023-02-28 DIAGNOSIS — R29818 Other symptoms and signs involving the nervous system: Secondary | ICD-10-CM | POA: Diagnosis not present

## 2023-03-05 ENCOUNTER — Encounter: Payer: Self-pay | Admitting: Neurology

## 2023-03-05 ENCOUNTER — Ambulatory Visit (INDEPENDENT_AMBULATORY_CARE_PROVIDER_SITE_OTHER): Payer: Medicare Other | Admitting: Psychiatry

## 2023-03-05 DIAGNOSIS — F330 Major depressive disorder, recurrent, mild: Secondary | ICD-10-CM

## 2023-03-05 NOTE — Progress Notes (Signed)
[facility] Counselor/Therapist Progress Note  Patient ID: Andrea Burke, MRN: [id number],    Date: 03/05/2023  Time Spent: 55 minutes   Treatment Type: Individual Therapy  Reported Symptoms: anxiety, depression, some fearfulness about possible Parkinson's diagnosis (has Dr appt 04/17/23)  Mental Status Exam:  Appearance:   Casual and Neat     Behavior:  Appropriate, Sharing, and Motivated  Motor:  Some effects of P  Speech/Language:   Clear and Coherent  Affect:  Depressed, Tearful, and anxious  Mood:  anxious, depressed, and sad  Thought process:  goal directed  Thought content:    Some obsessive thoughts re: health concerns  Sensory/Perceptual disturbances:    WNL  Orientation:  oriented to person, place, time/date, situation, day of week, month of year, year, and stated date of Mar 05, 2023  Attention:  Good later added "good at times and not so good at times"  Concentration:  Fair  Memory:  Some recent memory issues  Fund of knowledge:   Good  Insight:    Good and Fair  Judgment:   Good  Impulse Control:  Good   Risk Assessment: Danger to Self:  No Self-injurious Behavior: No Danger to Others: No Duty to Warn:no Physical Aggression / Violence:No  Access to Firearms a concern: No  Gang Involvement:No   Subjective:  Patient in session today and reporting depression, anxiety, fearfulness regarding whether some of her symptoms might be Parkinson's.  Does have appt with Dr. Arbutus Leas, at [facility] on April 17, 2023. Today needed session for processing and support as she shared her concerns at length.  Was able to engage patient some in not making too many assumptions about her future, and trying to stay more in the present, but also being careful with some symptoms she has started to notice that affect her walking.  Is not currently driving and her husband transported her to appointment today.  Denies any thoughts of self-harm, and reports that her husband is  supportive and also 1 other family member that they have talked to about this issue.  Does not want to tell other relatives until they know more from her evaluation at [facility] neurology in July.    Interventions: Cognitive Behavioral Therapy and Ego-Supportive Long-Term goal: (measurable)  Enhance the ability to handle effectively the full variety of life's anxieties. Patient will eventually rate herself as a "3 or under on a 1-10 scale for depression and for anxiety, for a period of at least 2 months."  Strategies:  Patient will work on re-framing thoughts that have been leading her to feel more anxious.  The re-framed thoughts will support more calmness and personal empowerment for patient.     Short Term goal:  Increase understanding of beliefs and messages that produce worry and anxiety. Strategies: Patient will work with therapist to gain more understanding of how anxiety and worry are supported by certain beliefs and messages.  She will be able to give at least 3 examples of such messages in an upcoming session.  Diagnosis:   ICD-10-CM   1. Mild episode of recurrent major depressive disorder (HCC)  F33.0      Plan:  Patient showing good participation today in session as she focused on her anxiety, stress, depression, and worrying..  Was able to recognize some of her excessive worrying and try to counteract it with what information she knows at this point about her condition versus what she does not know.  Does feel encouraged having  an appointment as early as July for further evaluation.  Patient making progress and needs to continue working with goal-directed behaviors especially in regards to her anxiety, depression, and trying to manage current stressors and uncertainty. Encouraged patient in her practice of more positive and self affirming behaviors as noted in session including: Believing more in her ability to make changes and live life more fully in the present, positive self talk,  trying to refrain from assuming worst-case scenarios, limit her watching of distressing news on TV or online, spending meaningful time with her family and friends, seeing the positives within herself each day, maintain contact with people who are supportive, continue on her prescribed medications, and recognize the strength she shows working with goal-directed behaviors to move in a direction that supports her improved emotional health and outlook.  Self rating scales: 1-10 depression scale-7/8 1-10 anxiety scale-7 1-10 self-esteem scale-6 1-10 motivation scale-6 1-10 hopefulness scale-5  Goal review and progress/challenges noted with patient.  Next appointment within 3 to 4 weeks.   Mathis Fare, LCSW

## 2023-03-26 ENCOUNTER — Ambulatory Visit (INDEPENDENT_AMBULATORY_CARE_PROVIDER_SITE_OTHER): Payer: Medicare Other | Admitting: Psychiatry

## 2023-03-26 DIAGNOSIS — F330 Major depressive disorder, recurrent, mild: Secondary | ICD-10-CM | POA: Diagnosis not present

## 2023-03-26 NOTE — Progress Notes (Signed)
[facility] Counselor/Therapist Progress Note  Patient ID: WRYNN GOBRECHT, MRN: [id number],    Date: 03/26/2023  Time Spent: 52 minutes   Treatment Type: Individual Therapy  Reported Symptoms: depression, anxiety, worrying (but not quite as bad); sees Neurologist on July 10th for Eval.  Mental Status Exam:  Appearance:   Casual, Neat, and Well Groomed     Behavior:  Appropriate, Sharing, and Motivated  Motor:  Normal, does have tremor in right hand and to see Dr. 04/17/23  Speech/Language:   Normal Rate  Affect:  Flat  Mood:  anxious and depressed  Thought process:  goal directed  Thought content:    WNL  Sensory/Perceptual disturbances:    WNL  Orientation:  oriented to person, place, time/date, situation, day of week, month of year, year, and stated date of March 26, 2023  Attention:  Fair  Concentration:  Fair  Memory:  Some memory issues and has appt with Neurologist for eval 04/17/2023  Fund of knowledge:   Good and Fair (self-reported)  Insight:    Good and Fair  (self-reported)  Judgment:   Good and Fair(self-reported)  Impulse Control:  Good and Fair(self-reported)   Risk Assessment: Danger to Self:  No Self-injurious Behavior: No Danger to Others: No Duty to Warn:no Physical Aggression / Violence:No  Access to Firearms a concern: No  Gang Involvement:No   Subjective: Patient in today and reports anxiety and depression along with some fearfulness related to a tremor in right arm/hand and fears "it may be Parkinson's Disease". Is to have evaluation by Dr. Arbutus Leas, Neurologist, on 04/17/2023. Talked through her fears/concerns about a possible PD diagnosis and adds she is trying to stay in the present and not worry or make assumptions prior to her seeing the neurologist soon. Processing more of her thoughts/concerns about her symptoms especially the tremor, and trying not to make any bad assumptions based on her fears.  No tearfulness today.  Did seem to have more  strength today as she spoke about her health concerns and upcoming appointment.  Encouraged patient to stay in the present and trying not to be imagining worst-case scenarios.  She knows that she is seeing a well-respected doctor for her evaluation and that feels encouraging to her.  Did report that she has had some symptoms that affect her walking stability "a little bit" and encouraged her to be careful as she moves about, including picking up her feet better as she walks when she described how her toe can catch the carpet and almost triple.  She does seem to be very aware and being more careful in her walking and in her moving about inside her home.  Will see patient next appointment after she has had her evaluation by Dr. Arbutus Leas.  Interventions: Cognitive Behavioral Therapy and Ego-Supportive  Long-Term goal: (measurable)  Enhance the ability to handle effectively the full variety of life's anxieties. Patient will eventually rate herself as a "3 or under on a 1-10 scale for depression and for anxiety, for a period of at least 2 months."  Strategies:  Patient will work on re-framing thoughts that have been leading her to feel more anxious.  The re-framed thoughts will support more calmness and personal empowerment for patient.     Short Term goal:  Increase understanding of beliefs and messages that produce worry and anxiety. Strategies: Patient will work with therapist to gain more understanding of how anxiety and worry are supported by certain beliefs and messages.  She  will be able to give at least 3 examples of such messages in an upcoming session.  Diagnosis:   ICD-10-CM   1. Mild episode of recurrent major depressive disorder (HCC)  F33.0      Plan: Patient in session today working further on her anxiety and depression as well as some fearfulness mostly related to the tremor that is developed in her right arm/hand.  As noted above she has an evaluation on this on April 17, 2023.  Patient did  quite well today and talking through her concerns and fears.  She was also more calm today and there was no tearfulness.  Smiled some appropriately and mood seem to be better today. Encouraged patient in her practice of more positive and self affirming behaviors as noted in session including: Believing more in her ability to make changes and live life more fully in the present, positive self talk, trying to refrain from assuming worst-case scenarios, limit her watching of distressing news on TV or online, spending meaningful time with her family and friends, seeing the positives within herself each day, maintain contact with people who are supportive, continue on her prescribed medications, and realize the strength she shows working with goal-directed behaviors to move in a direction that supports her improved emotional health and overall wellbeing.  Self rating scales: 1-10 depression scale-6/7 1-10 anxiety scale-7 1-10 self-esteem scale-6 1-10 motivation scale-6 1-10 hopefulness scale-5/6  Goal review and progress/challenges noted with patient.  Next appointment within 3 weeks.  Mathis Fare, LCSW

## 2023-04-04 ENCOUNTER — Encounter: Payer: Self-pay | Admitting: Psychiatry

## 2023-04-04 ENCOUNTER — Ambulatory Visit (INDEPENDENT_AMBULATORY_CARE_PROVIDER_SITE_OTHER): Payer: Medicare Other | Admitting: Psychiatry

## 2023-04-04 DIAGNOSIS — F330 Major depressive disorder, recurrent, mild: Secondary | ICD-10-CM

## 2023-04-04 DIAGNOSIS — F419 Anxiety disorder, unspecified: Secondary | ICD-10-CM

## 2023-04-04 NOTE — Progress Notes (Signed)
Andrea Burke [id number] 11-May-1946 77 y.o.  Subjective:   Patient ID:  Andrea Burke is a 77 y.o. (DOB 01/28/1946) female.  Chief Complaint:  Chief Complaint  Patient presents with   Follow-up    Anxiety, depression    HPI AMEILIA Burke presents to the office today for follow-up of anxiety and depression.   She reports that she has an apt with Dr. Arbutus Leas, neurologist, in July to evaluate for possible Parkinson's disease. She reports, "I've heard so many different things and decided I'm just going to set it aside" in terms of worry and concern about Parkinson's. She reports, "it's harder to do daily things now" due to stiffness and tremor. She reports that she has been tripping over her feet when she walks and steadying herself more. She reports, "I don't have the stamina I used to." She reports that she feels "tired."   She reports that she has been trying to stay hydrated and drink Gatorade while doing work outside.   She reports that her anxiety has been less. She reports that her mood is "fair, it's ok." She reports that her anxiety improved once she knew she has an appointment with neurology. She reports that she is falling asleep without difficulty. She reports that she usually needs a nap in the afternoons. "It went from trouble (sleeping), but now I just drop off." She reports that she has had some recent vivid dreams.  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   Past medication trials: Remeron- vivid dreams, increased appetite Lexapro-nightmares Sertraline-agitation, insomnia, impaired concentration Paxil-intolerable side effects Fluoxetine-intolerable side effects Viibryd- Partial improvement on 20 mg. Does not want to take doses above 20 mg. Cymbalta Wellbutrin- Severe tremors Lithium Hydroxyzine- has taken for itching.  Xanax Deplin Gabapentin- prescribed for pain   Mini-Mental    Flowsheet Row Office Visit from 11/18/2020 in [facility]  Psychiatric Group Office Visit from 04/25/2020 in [facility]  Total Score (max 30 points ) 29 29      Flowsheet Row ED from 02/26/2023 in [facility] Emergency Department at [facility]  C-SSRS RISK CATEGORY No Risk        Review of Systems:  Review of Systems  Musculoskeletal:  Positive for gait problem.  Neurological:  Positive for tremors.       Reports that she has not had a migraine for several years  Psychiatric/Behavioral:         Please refer to HPI    Medications: I have reviewed the patient's current medications.  Current Outpatient Medications  Medication Sig Dispense Refill   Calcium Carbonate-Vitamin D (CALTRATE 600+D PO) Take by mouth.     clobetasol cream (TEMOVATE) 0.05 % Apply topically.     Clobetasol Propionate (TEMOVATE) 0.05 % external spray Apply to scalp twice daily x 2 wk, then daily x 2 wk, then every other day x 2 wk. Not to face.     DULoxetine (CYMBALTA) 30 MG capsule Take 1 capsule (30 mg total) by mouth daily. 90 capsule 1   finasteride (PROSCAR) 5 MG tablet Take 5 mg by mouth daily.     fish oil-omega-3 fatty acids 1000 MG capsule Take 2 g by mouth daily.     hydroxychloroquine (PLAQUENIL) 200 MG tablet Take by mouth daily.     meclizine (ANTIVERT) 12.5 MG tablet Take 1 tablet (12.5 mg total) by mouth 3 (three) times daily as needed for dizziness. (Patient not taking: Reported on  02/27/2023) 30 tablet 0   No current facility-administered medications for this visit.    Medication Side Effects: None  Allergies:  Allergies  Allergen Reactions   Doxycycline Calcium    Penicillins     Past Medical History:  Diagnosis Date   Cancer (HCC)    KIDNEY   Discoid lupus    Endometriosis    Headache(784.0)    Insomnia    Kidney disease    Lupus (HCC)    Osteopenia    Urinary incontinence     Past Medical History, Surgical history, Social history, and Family history were reviewed and updated as appropriate.    Please see review of systems for further details on the patient's review from today.   Objective:   Physical Exam:  There were no vitals taken for this visit.  Physical Exam Constitutional:      General: She is not in acute distress. Musculoskeletal:        General: No deformity.  Neurological:     Mental Status: She is alert and oriented to person, place, and time.     Coordination: Coordination normal.  Psychiatric:        Attention and Perception: Attention and perception normal. She does not perceive auditory or visual hallucinations.        Mood and Affect: Affect is not labile, blunt, angry or inappropriate.        Speech: Speech normal.        Behavior: Behavior normal.        Thought Content: Thought content normal. Thought content is not paranoid or delusional. Thought content does not include homicidal or suicidal ideation. Thought content does not include homicidal or suicidal plan.        Cognition and Memory: Cognition and memory normal.        Judgment: Judgment normal.     Comments: Insight intact Mood presents as less anxious and less depressed compared to last exam. She continues to have anxiety around tremors and stiffness     Lab Review:     Component Value Date/Time   NA 132 (L) 02/26/2023 0515   K 4.0 02/26/2023 0515   CL 103 02/26/2023 0515   CO2 22 02/26/2023 0515   GLUCOSE 127 (H) 02/26/2023 0515   BUN 14 02/26/2023 0515   CREATININE 0.81 02/26/2023 0515   CREATININE 0.72 07/31/2014 1222   CALCIUM 9.1 02/26/2023 0515   PROT 7.5 07/31/2014 1222   ALBUMIN 4.3 07/31/2014 1222   AST 21 07/31/2014 1222   ALT 17 07/31/2014 1222   ALKPHOS 54 07/31/2014 1222   BILITOT 0.6 07/31/2014 1222   GFRNONAA >60 02/26/2023 0515       Component Value Date/Time   WBC 3.6 (L) 02/26/2023 0515   RBC 4.66 02/26/2023 0515   HGB 13.2 02/26/2023 0515   HCT 39.9 02/26/2023 0515   PLT 169 02/26/2023 0515   MCV 85.6 02/26/2023 0515   MCV 86.6 07/31/2014 1222    MCH 28.3 02/26/2023 0515   MCHC 33.1 02/26/2023 0515   RDW 12.7 02/26/2023 0515   LYMPHSABS 1,887 09/29/2018 0951   EOSABS 122 09/29/2018 0951   BASOSABS 30 09/29/2018 0951    No results found for: "POCLITH", "LITHIUM"   No results found for: "PHENYTOIN", "PHENOBARB", "VALPROATE", "CBMZ"   .res Assessment: Plan:    I spent 30 minutes dedicated to the care of this patient on the date of this  encounter to include pre-visit review of records, face-to-face time with the patient  discussing upcoming evaluation with neurologist and that if she has Parkinson's disease that her function and quality of life may significantly improve with treatment, ordering of medication, and post visit documentation. Will continue Cymbalta 30 mg daily for anxiety and depression since this dose has been well tolerated and pt has experienced worsening mood and anxiety symptoms when Cymbalta has been discontinued.  Recommend continuing therapy with Rockne Menghini, LCSW.  Pt to follow-up with this provider in 6 weeks or sooner if clinically indicated.  Patient advised to contact office with any questions, adverse effects, or acute worsening in signs and symptoms.    Jaynelle was seen today for follow-up.  Diagnoses and all orders for this visit:  Anxiety disorder, unspecified type  Mild episode of recurrent major depressive disorder (HCC)     Please see After Visit Summary for patient specific instructions.  Future Appointments  Date Time Provider Department Center  04/17/2023 10:15 AM Tat, Octaviano Batty, DO [facility] None  04/19/2023 11:00 AM Mathis Fare, LCSW [facility] None  05/09/2023  4:00 PM Corie Chiquito, PMHNP [facility] None    No orders of the defined types were placed in this encounter.   -------------------------------

## 2023-04-09 ENCOUNTER — Other Ambulatory Visit: Payer: Self-pay | Admitting: Psychiatry

## 2023-04-09 DIAGNOSIS — F419 Anxiety disorder, unspecified: Secondary | ICD-10-CM

## 2023-04-09 DIAGNOSIS — F330 Major depressive disorder, recurrent, mild: Secondary | ICD-10-CM

## 2023-04-15 NOTE — Progress Notes (Unsigned)
Assessment/Plan:   1.  Parkinsonism.  I suspect that this does represent idiopathic Parkinson's disease.  The patient has tremor, bradykinesia, rigidity and mild postural instability.  -We discussed the diagnosis as well as pathophysiology of the disease.  We discussed treatment options as well as prognostic indicators.  Patient education was provided.  -We discussed that it used to be thought that levodopa would increase risk of melanoma but now it is believed that Parkinsons itself likely increases risk of melanoma. she is to get regular skin checks.  -Greater than 50% of the 60 minute visit was spent in counseling answering questions and talking about what to expect now as well as in the future.  We talked about medication options as well as potential future surgical options.  We talked about safety in the home.  -We decided to add carbidopa/levodopa 25/100.  1/2 tab tid x 1 wk, then 1/2 in am & noon & 1 at night for a week, then 1/2 in am &1 at noon &night for a week, then 1 po tid at 10 AM/2 PM/6 PM.  Risks, benefits, side effects and alternative therapies were discussed.  The opportunity to ask questions was given and they were answered to the best of my ability.  The patient expressed understanding and willingness to follow the outlined treatment protocols.  -I will refer the patient [facility], for PT  -discussed Parkinsons Disease GeneRation study.  Father with Parkinsons Disease   -Discussed importance of regular daily schedule.  She is really sleeping in pretty late.  I told her I really want her up by 10 AM.  Discussed that I do not want any naps beyond 2 PM.  -We discussed community resources in the area including patient support groups and community exercise programs for PD and pt education was provided to the patient.   2.  Memory loss, per patient  -I do not suspect neurodegenerative.  -I suspect pseudodementia from underlying  depression.  3.  Depression  -Currently seeing psychiatry PA at [facility] and on Cymbalta.  Has tried and failed numerous antidepressants.  Subjective:   Andrea Burke was seen today in the movement disorders clinic for neurologic consultation at the request of Weber, Dema Severin, PA-C.  The consultation is for the evaluation of tremor, gait instability and to rule out Parkinson's disease.  Husband supplements hx.  Prior medical goods made available to me are reviewed.  Tremor: Yes.     How long has it been going on? 9 months; R hand only per pt but husband notes "whole body" with stress; she is R hand dominant;   At rest or with activation?  both  Fam hx of tremor?  Yes.  , father with Parkinsons Disease and dementia; mother with ET   Affected by caffeine:  Yes.   (Doesn't drink a lot)  Affected by alcohol:  doesn't drink any  Affected by stress:  Yes.    Affected by fatigue:  Yes.    Spills soup if on spoon:  Yes.    Spills glass of liquid if full:  Yes.    Prior exposure to antipsychotics: No (has been on numerous antidepressants per psychiatry NP, but I do not see antipsychotics)  Other Specific Symptoms:  Voice: pt thinks softer but pts husband doesn't think so Sleep:   Vivid Dreams:  Yes.    Acting out dreams:  Yes.   (Screams; yells) Wet Pillows: Yes.   Postural symptoms:  a little off balance but not too bad  Falls?  Rare - last was several months ago Bradykinesia symptoms: slow movements and difficulty getting out of a chair; occ dragging of the feet Loss of smell:  Yes.   Loss of taste:  Yes.   Urinary Incontinence:  Yes.  , (some urgency and has urdergarment for that) Difficulty Swallowing:  No. Handwriting, micrographia: Yes.   Trouble with ADL's:  No.  Trouble buttoning clothing: Yes.   Memory changes:  Yes.  , mostly short term problems; does own meds; drives Hallucinations:  No.  visual distortions: Yes.   N/V:  No. Lightheaded:  No.  Syncope: No. Diplopia:   No.    She had a CT brain Feb 26, 2023 that was nonacute.    Prior medications from psychiatry:  Mirtazapine (increased appetite, possibly dreaming);  Lexapro (nightmares); Sertraline-agitation, insomnia, impaired concentration Paxil-intolerable side effects Fluoxetine-intolerable side effects Viibryd- Partial improvement on 20 mg. Does not want to take doses above 20 mg. Cymbalta Wellbutrin- Severe tremors Lithium Hydroxyzine- has taken for itching.  Xanax Deplin Gabapentin- prescribed for pain  ALLERGIES:   Allergies  Allergen Reactions   Doxycycline Calcium    Penicillins     CURRENT MEDICATIONS:  Current Outpatient Medications  Medication Instructions   Calcium Carbonate-Vitamin D (CALTRATE 600+D PO) Take by mouth.   clobetasol cream (TEMOVATE) 0.05 % Topical   Clobetasol Propionate (TEMOVATE) 0.05 % external spray Apply to scalp twice daily x 2 wk, then daily x 2 wk, then every other day x 2 wk. Not to face.   DULoxetine (CYMBALTA) 30 mg, Oral, Daily   finasteride (PROSCAR) 5 mg, Oral, Daily   fish oil-omega-3 fatty acids 2 g, Daily   hydroxychloroquine (PLAQUENIL) 200 MG tablet Daily    Objective:   PHYSICAL EXAMINATION:    VITALS:   Vitals:   04/17/23 0959  BP: (!) 137/90  Pulse: 64  SpO2: 98%  Weight: 142 lb 3.2 oz (64.5 kg)  Height: 5' (1.524 m)    GEN:  The patient appears stated age and is in NAD. HEENT:  Normocephalic, atraumatic.  The mucous membranes are moist. The superficial temporal arteries are without ropiness or tenderness. CV:  RRR Lungs:  CTAB Neck/HEME:  There are no carotid bruits bilaterally.  Neurological examination:  Orientation: The patient is alert and oriented x3.  Cranial nerves: There is good facial symmetry.  Extraocular muscles are intact. The visual fields are full to confrontational testing. The speech is fluent and clear. Soft palate rises symmetrically and there is no tongue deviation. Hearing is intact to  conversational tone. Sensation: Sensation is intact to light touch throughout (facial, trunk, extremities). Vibration is intact at the bilateral big toe. There is no extinction with double simultaneous stimulation.  Motor: Strength is 5/5 in the bilateral upper and lower extremities.   Shoulder shrug is equal and symmetric.  There is no pronator drift. Deep tendon reflexes: Deep tendon reflexes are 2/4 at the bilateral biceps, triceps, brachioradialis, patella and achilles. Plantar responses are downgoing bilaterally.  Movement examination: Tone: There is mod increased tone in the RUE/RLE and mod in the LLE and mild to mod in the LUE Abnormal movements: there is RUE rest tremor; there is oral tremor Coordination:  There is  decremation with RAM's, with any form of RAMS, including alternating supination and pronation of the forearm, hand opening and closing, finger taps, heel taps and toe taps, R>L Gait and Station: The patient has no difficulty arising  out of a deep-seated chair without the use of the hands. The patient's stride length is good.   I have reviewed and interpreted the following labs independently   Chemistry      Component Value Date/Time   NA 132 (L) 02/26/2023 0515   K 4.0 02/26/2023 0515   CL 103 02/26/2023 0515   CO2 22 02/26/2023 0515   BUN 14 02/26/2023 0515   CREATININE 0.81 02/26/2023 0515   CREATININE 0.72 07/31/2014 1222      Component Value Date/Time   CALCIUM 9.1 02/26/2023 0515   ALKPHOS 54 07/31/2014 1222   AST 21 07/31/2014 1222   ALT 17 07/31/2014 1222   BILITOT 0.6 07/31/2014 1222      Lab Results  Component Value Date   TSH 1.45 09/29/2018   Lab Results  Component Value Date   WBC 3.6 (L) 02/26/2023   HGB 13.2 02/26/2023   HCT 39.9 02/26/2023   MCV 85.6 02/26/2023   PLT 169 02/26/2023      Total time spent on today's visit was 60 minutes, including both face-to-face time and nonface-to-face time.  Time included that spent on review of  records (prior notes available to me/labs/imaging if pertinent), discussing treatment and goals, answering patient's questions and coordinating care.  Cc:  Catha Gosselin, MD

## 2023-04-17 ENCOUNTER — Encounter: Payer: Self-pay | Admitting: Neurology

## 2023-04-17 ENCOUNTER — Ambulatory Visit: Payer: Medicare Other | Admitting: Neurology

## 2023-04-17 VITALS — BP 137/90 | HR 64 | Ht 60.0 in | Wt 142.2 lb

## 2023-04-17 DIAGNOSIS — F321 Major depressive disorder, single episode, moderate: Secondary | ICD-10-CM | POA: Diagnosis not present

## 2023-04-17 DIAGNOSIS — R413 Other amnesia: Secondary | ICD-10-CM

## 2023-04-17 DIAGNOSIS — G20A1 Parkinson's disease without dyskinesia, without mention of fluctuations: Secondary | ICD-10-CM

## 2023-04-17 MED ORDER — CARBIDOPA-LEVODOPA 25-100 MG PO TABS
1.0000 | ORAL_TABLET | Freq: Three times a day (TID) | ORAL | 1 refills | Status: DC
Start: 2023-04-17 — End: 2023-07-25

## 2023-04-17 NOTE — Patient Instructions (Addendum)
Start Carbidopa Levodopa as follows: Take 1/2 tablet three times daily, at least 30 minutes before meals (approximately 10am/2pm/6pm), for one week Then take 1/2 tablet in the morning, 1/2 tablet in the afternoon, 1 tablet in the evening, at least 30 minutes before meals, for one week Then take 1/2 tablet in the morning, 1 tablet in the afternoon, 1 tablet in the evening, at least 30 minutes before meals, for one week Then take 1 tablet three times daily at 10am/2pm/6pm, at least 30 minutes before meals   As a reminder, carbidopa/levodopa can be taken at the same time as a carbohydrate, but we like to have you take your pill either 30 minutes before a protein source or 1 hour after as protein can interfere with carbidopa/levodopa absorption.   Local and Online Resources for Power over Parkinson's Group  June 2024   LOCAL [place] PARKINSON'S GROUPS   Power over Parkinson's Group:    Power Over Parkinson's Patient Education Group will be Wednesday, JULY 10th-*PLEASE NOTE:  We will not be having a June meeting.  Our next meeting will be in July.  We apologize for the inconvenience.   Power over [employer] and Care Partner Groups will meet together, with plans for separate break out session for caregivers, depending on topic/speaker Upcoming Power over Parkinson's Meetings/Care Partner Support:  2nd Wednesdays of the month at 2 pm:   No regular June meeting, July 10th  Contact Amy Marriott at [email or web address] or Lynwood Dawley at [email or web address] if interested in participating in this group    LOCAL EVENTS AND NEW OFFERINGS  PLEASE NOTE:  There will be no June Power over [employer] meeting [facility] June 12th) Picnic Party for [employer].   [facility] Movement Disorders Program Celebration.  June 19th 1-3 pm.  Contact Lynwood Dawley at [email or web address] Parkinson's Social Game Night.  First Thursday of each month, 2:00-4:00 pm.  *Next date  is June 6th*.  Rossie Muskrat [employer], [employer].  Contact [email or web address] if interested. Parkinson's CarePartner Group for Men is in the works, if interested email [email or web address] ACT FITNESS Chair Yoga classes "Train and Gain", Fridays 10 am, [employer].  Contact Gina at [phone].  Health visitor Classes offering at [employer]!  TUESDAYS (Chair Yoga)  and Wednesdays (PWR! Moves)  1:00 pm.   Contact Synetta Shadow at  [email or web address]  or [phone]  Antoine Poche! Moves classes.  Thursdays at 11:45, [facility].  Free to participate through [employer].  Contact Lynwood Dawley at [email or web address] or [phone] to register Drumming for Parkinson's will be held on 2nd and 4th Mondays at 11:00 am.   Located at the [employer] of the [employer] ([address]. [place]  Contact Albertina Parr at [email or web address] or [phone]  [facility] Parkinson's Tai Chi Class, Mondays at 11 am.  Call [phone] for details   [facility] EDUCATION AND SUPPORT  Parkinson Foundation:  www.parkinson.org  PD Health at Home continues:  Mindfulness Mondays, Wellness Wednesdays, Fitness Fridays  (PWR! Moves as part of Fitness Fridays March 22nd, 1-1:45 pm) Upcoming Education:   Current and Emerging Methods to Aid Parkinson's Diagnosis.  Wednesday, May 29th, 1-2 pm Recognize and Respond to Parkinson's Psychosis.  Wednesday, June 5th, 1-2 pm Parkinsonisms.  Wednesday, June 12th, 1-2 pm Expert Briefing:  Addressing the Challenge of Apathy in Parkinson's.  Wednesday, September 11th, 1-2 pm. Register for virtual education and Photographer (webinars) at [email or web address].gl Please check out their website to sign up  for emails and see their full online offerings     [employer]:  www.[email or web address]    Third Thursday Webinars:  On the third Thursday of every month at 12 p.m. ET, join our free live webinars to learn about various aspects of living with Parkinson's disease and our work to speed medical breakthroughs.  Upcoming Webinar:  You Want to [employer] for Parkinson's Research: Now What?  Thursday, June 20th at 12 noon. Check out additional information on their website to see their full online offerings    [facility]:  [email or web address]  Upcoming Webinar:  Living Safely with Parkinson's Inside and Outside of your Home.  Thursday, June 13th , 3 pm Series:  Living with Parkinson's Meetup.   Third Thursdays each month, 3 pm  Care Partner Monthly Meetup.  With Jillene Bucks [place].  First Tuesday of each month, 2 pm  Check out additional information to Live Well Today on their website    Parkinson and Movement Disorders (PMD) Alliance:  www.pmdalliance.org  [employer] Online:  Online Education Events  Sign up for emails, which are sent weekly to give you updates on programming and online offerings    Parkinson's Association of the [place]:  www.parkinsonassociation.org  Information on online support groups, education events, and online exercises including Yoga, Parkinson's exercises and more-LOTS of information on links to PD resources and online events  Virtual Support Group through Parkinson's Association of the [place]; next one is scheduled for Wednesday, June 5th   MOVEMENT AND EXERCISE OPPORTUNITIES  Parkinson's Exercise Class offerings at [employer]. *TUESDAYS* (Chair yoga) and Wednesdays (PWR! Moves)  1:00 pm.   *Please note that PWR! Moves Pulcifer class has ended.  Please consider trying PWR! Moves on Wednesdays at 1 pm at [facility].  Contact Synetta Shadow at [email or web address]    Parkinson's Wellness Recovery (PWR! Moves)  www.[email or web address]  Info on the PWR! Virtual Experience:  You will have access to our expertise?through  self-assessment, guided plans that start with the PD-specific fundamentals, educational content, tips, Q&A with an expert, and a growing Engineering geologist of PD-specific pre-recorded and live exercise classes of varying types and intensity - both physical and cognitive! If that is not enough, we offer 1:1 wellness consultations (in-person or virtual) to personalize your PWR! Dance movement psychotherapist.   Parkinson [employer] Fridays:   As part of the PD Health @ Home program, this free video series focuses each week on one aspect of fitness designed to support people living with Parkinson's.? These weekly videos highlight the Parkinson Foundation fitness guidelines for people with Parkinson's disease.  [email or web address].com.[email or web address]  Dance for PD website is offering free, live-stream classes throughout the week, as well as links to [employer] of classes:  [email or web address]  Virtual dance and Pilates for Parkinson's classes: Click on the Community Tab> Parkinson's Movement Initiative Tab.  To register for classes and for more information, visit www.[email or web address].co.za and click the "community" tab.   [employer] Parkinson's Cycling Classes   [facility] [employer]:  Thursdays @ Noon-Live classes at [employer] ([employer] at [email or web address]?or [phone])  [facility]: Classes Tuesday, Wednesday and Thursday (contact [place] at [email or web address] ?or [phone])  [facility] [employer]  Varied levels of classes are offered Tuesdays and Thursdays at [employer].   Stretching with Byrd Hesselbach weekly class is also offered for people with Parkinson's  To observe a class or for more information, call [phone] or email Patricia Nettle at [email or web address]   ADDITIONAL SUPPORT AND RESOURCES  [employer]:  Online  Caregiver Education Opportunities:   www.well-springsolutions.org/caregiver-education/caregiver-support-group.  You may also contact Loleta Chance at [place] [employer] -spring.org or [phone].     Well-Spring Navigator:  Just1Navigator program, a?free service to help individuals and families through the journey of determining care for older adults.  The "Navigator" is a Child psychotherapist, Sidney Ace, who will speak with a prospective client and/or loved ones to provide an assessment of the situation and a set of recommendations for a personalized care plan -- all free of charge, and whether?Well-Spring [employer] offers the needed service or not. If the need is not a service we provide, we are well-connected with reputable programs in town that we can refer you to.  www.well-springsolutions.org or to speak with the Navigator, call [phone].

## 2023-04-19 ENCOUNTER — Ambulatory Visit (INDEPENDENT_AMBULATORY_CARE_PROVIDER_SITE_OTHER): Payer: Medicare Other | Admitting: Psychiatry

## 2023-04-19 DIAGNOSIS — F419 Anxiety disorder, unspecified: Secondary | ICD-10-CM

## 2023-04-19 NOTE — Progress Notes (Signed)
[facility] Counselor/Therapist Progress Note  Patient ID: Andrea Burke, MRN: [id number],    Date: 04/19/2023  Time Spent:    50 minutes   Treatment Type: Individual Therapy  Reported Symptoms: anxiety, worrying about health issues and recent diagnosis of Parkinson's disease without dyskinesia or fluctuating manifestations.  Mental Status Exam:  Appearance:   Casual and Neat     Behavior:  Appropriate, Sharing, and Motivated  Motor:  Tremor  Speech/Language:   Clear and Coherent  Affect:  Depressed, Tearful, and anxious  Mood:  anxious and depressed  Thought process:  goal directed  Thought content:    WNL  Sensory/Perceptual disturbances:    WNL  Orientation:  oriented to person, place, time/date, situation, day of week, month of year, year, and stated date of April 19, 2023  Attention:  Good  Concentration:  Good and Fair  Memory:  Some memory issues and states Dr is aware  Fund of knowledge:   Good and Fair  Insight:    Good and Fair  Judgment:   Good  Impulse Control:  Good   Risk Assessment: Danger to Self:  No Self-injurious Behavior: No Danger to Others: No Duty to Warn:no Physical Aggression / Violence:No  Access to Firearms a concern: No  Gang Involvement:No   Subjective:  Patient in for appointment today reporting anxiety, fearfulness, as she just recently saw Dr. Arbutus Leas for evaluation re: Parkinson's  symptoms and was positively diagnosed with Parkinson's disease as noted above. Patient tearful and fearful today in session and needed our time to share and talk through her recent appt at  Dr. Don Perking office (Neurologist) where patient was diagnosed. Talked openly about her fears and concerns and seemed to feel better by end of session. Tends to be a private person so it was a positive thing for her to vent more freely including her tearfulness and fearfulness and be able to receive support.  Trying to stay in the present and not assume worst-case scenarios.   Sees her neurologist again in early August.  Will see patient at our office here again within 2 weeks.  Interventions: Cognitive Behavioral Therapy and Ego-Supportive  Long-Term goal: (measurable)  Enhance the ability to handle effectively the full variety of life's anxieties. Patient will eventually rate herself as a "3 or under on a 1-10 scale for depression and for anxiety, for a period of at least 2 months."  Strategies:  Patient will work on re-framing thoughts that have been leading her to feel more anxious.  The re-framed thoughts will support more calmness and personal empowerment for patient.     Short Term goal:  Increase understanding of beliefs and messages that produce worry and anxiety. Strategies: Patient will work with therapist to gain more understanding of how anxiety and worry are supported by certain beliefs and messages.  She will be able to give at least 3 examples of such messages in an upcoming session.  Diagnosis:   ICD-10-CM   1. Anxiety disorder, unspecified type  F41.9      Plan:  Patient in for session today focusing more on her anxiety, fearfulness, and some depression related to recently being diagnosed with Parkinson's disease, which she had feared.  Did well and venting today and receiving support.  She has made progress overall with her treatment goals and needs to continue working with goal-directed behaviors to move in a forward direction. Encouraged patient in practicing more positive and self affirming behaviors as noted in session  including: Believing more in her ability to make changes and live life more fully in the present, trust her ability to follow through on recommendations per her neurologist regarding coping with parkinsonian symptoms, positive self talk, trying to refrain from assuming worst-case scenarios, limit her watching of distressing news on TV or online, spending meaningful time with her family and friends, maintain contact with people who  are supportive, continue on her prescribed medications, and recognize the strengths she shows working with goal-directed behaviors to move in a direction that supports her improved emotional health and outlook into the future.   Self rating scales:(April 19, 2023) 1-10 depression scale-6 1-10 anxiety scale-7/8 1-10 self-esteem scale-6 1-10 motivation scale-6 1-10 hopefulness scale-5/6  Goal review and progress/challenges noted with patient.  Next appointment within 2 to 3 weeks.   Mathis Fare, LCSW

## 2023-04-22 ENCOUNTER — Other Ambulatory Visit: Payer: Self-pay

## 2023-04-22 ENCOUNTER — Encounter: Payer: Self-pay | Admitting: Physical Therapy

## 2023-04-22 ENCOUNTER — Ambulatory Visit: Payer: Medicare Other | Attending: Neurology | Admitting: Physical Therapy

## 2023-04-22 DIAGNOSIS — G20A1 Parkinson's disease without dyskinesia, without mention of fluctuations: Secondary | ICD-10-CM | POA: Diagnosis not present

## 2023-04-22 DIAGNOSIS — R278 Other lack of coordination: Secondary | ICD-10-CM | POA: Diagnosis not present

## 2023-04-22 DIAGNOSIS — M6281 Muscle weakness (generalized): Secondary | ICD-10-CM | POA: Insufficient documentation

## 2023-04-22 DIAGNOSIS — R293 Abnormal posture: Secondary | ICD-10-CM | POA: Diagnosis not present

## 2023-04-22 DIAGNOSIS — R2689 Other abnormalities of gait and mobility: Secondary | ICD-10-CM | POA: Diagnosis not present

## 2023-04-22 DIAGNOSIS — R2681 Unsteadiness on feet: Secondary | ICD-10-CM | POA: Insufficient documentation

## 2023-04-22 DIAGNOSIS — M25611 Stiffness of right shoulder, not elsewhere classified: Secondary | ICD-10-CM | POA: Insufficient documentation

## 2023-04-22 DIAGNOSIS — R29818 Other symptoms and signs involving the nervous system: Secondary | ICD-10-CM | POA: Diagnosis not present

## 2023-04-22 NOTE — Therapy (Signed)
OUTPATIENT PHYSICAL THERAPY NEURO EVALUATION   Patient Name: Andrea Burke MRN: [id number] DOB:1946/08/24, 77 y.o., female Today's Date: 04/22/2023   PCP: Catha Gosselin, MD REFERRING PROVIDER: Vladimir Faster, DO   END OF SESSION:  PT End of Session - 04/22/23 1450     Visit Number 1    Number of Visits 16    Date for PT Re-Evaluation 06/14/23    Authorization Type Medicare/BCBS    Progress Note Due on Visit 10    PT Start Time 1447    PT Stop Time 1531    PT Time Calculation (min) 44 min    Activity Tolerance Patient tolerated treatment well    Behavior During Therapy WFL for tasks assessed/performed             Past Medical History:  Diagnosis Date   Cancer (HCC)    KIDNEY   Discoid lupus    Endometriosis    Headache(784.0)    Insomnia    Kidney disease    Lupus (HCC)    Osteopenia    Urinary incontinence    Past Surgical History:  Procedure Laterality Date   CATARACT EXTRACTION, BILATERAL     PELVIC LAPAROSCOPY     DIAG LASER LAP-ENDOMETRIOSIS   REMOVAL OF LEFT KIDNEY  10/08/2006   Patient Active Problem List   Diagnosis Date Noted   Major depressive disorder, single episode, moderate (HCC) 08/06/2018   Moderate major depression (HCC) 06/23/2018   Anxiety disorder 06/23/2018   Pruritus 04/01/2017   Onychomycosis 05/12/2012   Lentigines 05/12/2012   Discoid lupus    Cancer (HCC)    Headache(784.0)    Insomnia    Endometriosis    Osteopenia     ONSET DATE: 04/17/2023 (MD referral)  REFERRING DIAG: G20.A1 (ICD-10-CM) - Parkinson's disease without dyskinesia or fluctuating manifestations   THERAPY DIAG:  Other abnormalities of gait and mobility  Unsteadiness on feet  Abnormal posture  Other symptoms and signs involving the nervous system  Muscle weakness (generalized)  Rationale for Evaluation and Treatment: Rehabilitation  SUBJECTIVE:                                                                                                                                                                                              SUBJECTIVE STATEMENT: Had tremors for about a year and they were slight.  About a month ago, I began feeling not as comfortable with walking and steps. Pt accompanied by: self and significant other  PERTINENT HISTORY: recent dx of PD, memory loss, depression  PAIN:  Are you having pain? No  PRECAUTIONS: Fall  RED  FLAGS: None   WEIGHT BEARING RESTRICTIONS: No  FALLS: Has patient fallen in last 6 months? No  LIVING ENVIRONMENT: Lives with: lives with their spouse Lives in: House/apartment Stairs: Yes: External: 2 steps; on left going up Has following equipment at home: None  PLOF: Independent  Enjoys volunteering at [employer]; enjoys travelling.  PATIENT GOALS: Want to do my activities without falling and continue to travel  OBJECTIVE:   DIAGNOSTIC FINDINGS: NA for this episode  COGNITION: Overall cognitive status: Within functional limits for tasks assessed and reports fear and embarrassment in regards to tremors   SENSATION: WFL  MUSCLE TONE: RLE: Mild   POSTURE: rounded shoulders, forward head, and holds RUE in shoulder internal rotation, elbow flexion, fisted hand (able to extend and open up with cues)  LOWER EXTREMITY ROM:   WFL BLEs in sitting   LOWER EXTREMITY MMT:    MMT Right Eval Left Eval  Hip flexion 4 4  Hip extension    Hip abduction    Hip adduction    Hip internal rotation    Hip external rotation    Knee flexion 4 4  Knee extension 4+ 4  Ankle dorsiflexion 4+ 4  Ankle plantarflexion    Ankle inversion    Ankle eversion    (Blank rows = not tested)   TRANSFERS: Assistive device utilized: None  Sit to stand: SBA Stand to sit: SBA STAIRS: Level of Assistance: Modified independence Stair Negotiation Technique: Alternating Pattern  with Single Rail on Right Number of Stairs: 2-3  Height of Stairs: 4-6"    GAIT: Gait pattern:  holds RUE  with LUE to prevent increased tremors, step through pattern, decreased arm swing- Left, decreased step length- Right, decreased trunk rotation, and narrow BOS Distance walked: 50 ft x 4 reps Assistive device utilized: None Level of assistance: SBA   FUNCTIONAL TESTS:  5 times sit to stand: 20.78 sec with arms crossed at ches Timed up and go (TUG): 12.31 sec Dynamic Gait Index: 14/24 TUG cognitive:11.32 sec (slowed counting) 70M walk: 10.12 sec (3.24 ft/sec)   [facility] PT Assessment - 04/22/23 0001       Standardized Balance Assessment   Standardized Balance Assessment Dynamic Gait Index      Dynamic Gait Index   Level Surface Mild Impairment   6.35 sec   Change in Gait Speed Mild Impairment    Gait with Horizontal Head Turns Mild Impairment    Gait with Vertical Head Turns Mild Impairment    Gait and Pivot Turn Moderate Impairment    Step Over Obstacle Moderate Impairment    Step Around Obstacles Mild Impairment    Steps Mild Impairment    Total Score 14              TODAY'S TREATMENT:  DATE: 04/22/2023    PATIENT EDUCATION: Education details: Eval results, POC, rationale for OT to address RUE tremor and bradykinesia; seated PWR! UP initiated for HEP Person educated: Patient and Spouse Education method: Explanation, Demonstration, and Handouts Education comprehension: verbalized understanding, returned demonstration, and needs further education  HOME EXERCISE PROGRAM: Initiated seated PWR! Up with handouts-perform 1-2x/daily, 5-10 reps  GOALS: Goals reviewed with patient? Yes  SHORT TERM GOALS: Target date: 05/17/2023  Pt will be independent with HEP for improved balance, strength, gait. Baseline: Goal status: INITIAL  2.  Pt will improve 5x sit<>stand to less than or equal to 15 sec to demonstrate improved functional strength and transfer  efficiency. Baseline: 20.78 sec Goal status: INITIAL  3.  Pt will verbalize understanding of local Parkinson's disease community resources.  Baseline: newly dx with PD, July 2024 Goal status: INITIAL   LONG TERM GOALS: Target date: 06/14/2023  Pt will be independent with HEP for improved balance, strength, gait. Baseline:  Goal status: INITIAL  2.  Pt will improve 5x sit<>stand to less than or equal to 12.5 sec to demonstrate improved functional strength and transfer efficiency. Baseline: 20.78 sec Goal status: INITIAL  3.  Pt will improve DGI score to at least 19/24 to decrease fall risk. Baseline: 14/24 Goal status: INITIAL  4.  Pt will verbalize plans for continued community fitness upon d/c from PT to maximize gains made in PT. Baseline:  Goal status: INITIAL  ASSESSMENT:  CLINICAL IMPRESSION: Patient is a 76 y.o. female who was seen today for physical therapy evaluation and treatment for Parkinson's disease.   She is newly diagnosed 04/17/2023, and just began on Sinemet 1-2 days ago.  She presents with RUE tremor, abnormal posture (holding RUE close to body to keep it from tremor with increased activity), bradykinesia, decreased functional strength, decreased balance, decreased timing and coordination with gait.  She is at fall risk per DGI and FTSTS scores.   She is active with volunteering and with traveling.  She would benefit from skilled PT towards goals to address above stated deficits to decrease fall risk and to improve overall functional mobility. *She would benefit from OT to address RUE tremor and slowed mobility/fine motor control with RUE.  OBJECTIVE IMPAIRMENTS: Abnormal gait, decreased balance, decreased mobility, difficulty walking, decreased strength, impaired tone, and postural dysfunction.   ACTIVITY LIMITATIONS: carrying, bending, standing, transfers, reach over head, and locomotion level  PARTICIPATION LIMITATIONS: meal prep, cleaning, laundry, driving,  shopping, and community activity  PERSONAL FACTORS: 3+ comorbidities: see above; pt newly diagnosed with PD < 1 week ago  are also affecting patient's functional outcome.   REHAB POTENTIAL: Good  CLINICAL DECISION MAKING: Evolving/moderate complexity  EVALUATION COMPLEXITY: Moderate  PLAN:  PT FREQUENCY: 2x/week  PT DURATION: 8 weeks, including eval week  PLANNED INTERVENTIONS: Therapeutic exercises, Therapeutic activity, Neuromuscular re-education, Balance training, Gait training, Patient/Family education, Self Care, and DME instructions  PLAN FOR NEXT SESSION: Initiate formal HEP with seated PWR! Moves; work on standing PWR! Moves, gait and balance activities to incorporate RUE arm swing, sit<>Stand   Laporshia Hogen W., PT 04/22/2023, 5:33 PM  [facility] at [facility] [address] [place], [address] [place], [place] [address] Phone # [phone] Fax # [phone]

## 2023-04-23 ENCOUNTER — Telehealth: Payer: Self-pay | Admitting: Neurology

## 2023-04-23 ENCOUNTER — Other Ambulatory Visit: Payer: Self-pay

## 2023-04-23 DIAGNOSIS — G20A1 Parkinson's disease without dyskinesia, without mention of fluctuations: Secondary | ICD-10-CM

## 2023-04-23 MED ORDER — CENTRUM SILVER 50+WOMEN PO TABS: ORAL_TABLET | ORAL | Status: DC

## 2023-04-23 NOTE — Telephone Encounter (Signed)
Called and gave patient advice

## 2023-04-23 NOTE — Telephone Encounter (Signed)
Pt is calling in stating that she is a little confused about taking her carbidopa-levodopa (SINEMET IR) 25-100 MG taking it with Calcium Carbonate-Vitamin D (CALTRATE 600+D PO) and her Centrum Silver multi vitamin (she would like to know if she should just get a different kind of vitamin).  She is also concerned about what she should and shouldn't be eating since she was asked to watch her protein and carbohydrates.  Pt would like to have a call back.

## 2023-04-24 NOTE — Therapy (Signed)
OUTPATIENT PHYSICAL THERAPY NEURO TREATMENT   Patient Name: Andrea Burke MRN: [id number] DOB:Jan 27, 1946, 77 y.o., female Today's Date: 04/25/2023   PCP: Catha Gosselin, MD REFERRING PROVIDER: Vladimir Faster, DO   END OF SESSION:  PT End of Session - 04/25/23 1616     Visit Number 2    Number of Visits 16    Date for PT Re-Evaluation 06/14/23    Authorization Type Medicare/BCBS    Progress Note Due on Visit 10    PT Start Time 1534    PT Stop Time 1617    PT Time Calculation (min) 43 min    Activity Tolerance Patient tolerated treatment well    Behavior During Therapy WFL for tasks assessed/performed              Past Medical History:  Diagnosis Date   Cancer (HCC)    KIDNEY   Discoid lupus    Endometriosis    Headache(784.0)    Insomnia    Kidney disease    Lupus (HCC)    Osteopenia    Urinary incontinence    Past Surgical History:  Procedure Laterality Date   CATARACT EXTRACTION, BILATERAL     PELVIC LAPAROSCOPY     DIAG LASER LAP-ENDOMETRIOSIS   REMOVAL OF LEFT KIDNEY  10/08/2006   Patient Active Problem List   Diagnosis Date Noted   Major depressive disorder, single episode, moderate (HCC) 08/06/2018   Moderate major depression (HCC) 06/23/2018   Anxiety disorder 06/23/2018   Pruritus 04/01/2017   Onychomycosis 05/12/2012   Lentigines 05/12/2012   Discoid lupus    Cancer (HCC)    Headache(784.0)    Insomnia    Endometriosis    Osteopenia     ONSET DATE: 04/17/2023 (MD referral)  REFERRING DIAG: G20.A1 (ICD-10-CM) - Parkinson's disease without dyskinesia or fluctuating manifestations   THERAPY DIAG:  Other abnormalities of gait and mobility  Unsteadiness on feet  Abnormal posture  Other symptoms and signs involving the nervous system  Muscle weakness (generalized)  Rationale for Evaluation and Treatment: Rehabilitation  SUBJECTIVE:                                                                                                                                                                                              SUBJECTIVE STATEMENT: Doing okay. Nothing new.  Pt accompanied by: self and significant other  PERTINENT HISTORY: recent dx of PD, memory loss, depression  PAIN:  Are you having pain? No  PRECAUTIONS: Fall  RED FLAGS: None   WEIGHT BEARING RESTRICTIONS: No  FALLS: Has patient fallen in last 6 months? No  LIVING ENVIRONMENT: Lives with: lives with their spouse Lives in: House/apartment Stairs: Yes: External: 2 steps; on left going up Has following equipment at home: None  PLOF: Independent  Enjoys volunteering at [employer]; enjoys travelling.  PATIENT GOALS: Want to do my activities without falling and continue to travel  OBJECTIVE:     TODAY'S TREATMENT: 04/25/23 Activity Comments  seated PWR! Moves   PWR! Up 10x  PWR! Rock 10x   PWR! Twist 10x  PWR! Step 10x  Cueing for proper position/alignment. Good ability to mirror and set switch. Worked on separating components of PWR Twist to elicit intended hip rotation   sit<>Stand 10x Cueing for set up and promoting anterior trunk lean/"nose over toes"  Sitting prayer stretch with green pball, then prayer stretch with additional STS 5x, then without ball 3x Priming for anterior trunk lean. Improved success after drill     HOME EXERCISE PROGRAM Last updated: 04/25/23 Access Code: [email or web address] URL: [email or web address] Date: 04/25/2023 Prepared by: [facility] - Outpatient  Rehab - [facility]  Exercises - Sit to Stand Without Arm Support  - 1 x daily - 5 x weekly - 2 sets - 10 reps  ALSO: seated PWR! Up with handouts-perform 1-2x/daily, 5-10 reps  PATIENT EDUCATION: Education details: provided another handout of seated PWR! Moves (previous one did not include PWR step), discussed patient/husband's request for OT referral Person educated: Patient and Spouse Education method: Explanation, Demonstration,  Tactile cues, Verbal cues, and Handouts Education comprehension: verbalized understanding and returned demonstration   Below measures were taken at time of initial evaluation unless otherwise specified:   DIAGNOSTIC FINDINGS: NA for this episode  COGNITION: Overall cognitive status: Within functional limits for tasks assessed and reports fear and embarrassment in regards to tremors   SENSATION: WFL  MUSCLE TONE: RLE: Mild   POSTURE: rounded shoulders, forward head, and holds RUE in shoulder internal rotation, elbow flexion, fisted hand (able to extend and open up with cues)  LOWER EXTREMITY ROM:   WFL BLEs in sitting   LOWER EXTREMITY MMT:    MMT Right Eval Left Eval  Hip flexion 4 4  Hip extension    Hip abduction    Hip adduction    Hip internal rotation    Hip external rotation    Knee flexion 4 4  Knee extension 4+ 4  Ankle dorsiflexion 4+ 4  Ankle plantarflexion    Ankle inversion    Ankle eversion    (Blank rows = not tested)   TRANSFERS: Assistive device utilized: None  Sit to stand: SBA Stand to sit: SBA STAIRS: Level of Assistance: Modified independence Stair Negotiation Technique: Alternating Pattern  with Single Rail on Right Number of Stairs: 2-3  Height of Stairs: 4-6"    GAIT: Gait pattern:  holds RUE with LUE to prevent increased tremors, step through pattern, decreased arm swing- Left, decreased step length- Right, decreased trunk rotation, and narrow BOS Distance walked: 50 ft x 4 reps Assistive device utilized: None Level of assistance: SBA   FUNCTIONAL TESTS:  5 times sit to stand: 20.78 sec with arms crossed at ches Timed up and go (TUG): 12.31 sec Dynamic Gait Index: 14/24 TUG cognitive:11.32 sec (slowed counting) 62M walk: 10.12 sec (3.24 ft/sec)      TODAY'S TREATMENT:  DATE: 04/22/2023    PATIENT  EDUCATION: Education details: Eval results, POC, rationale for OT to address RUE tremor and bradykinesia; seated PWR! UP initiated for HEP Person educated: Patient and Spouse Education method: Explanation, Demonstration, and Handouts Education comprehension: verbalized understanding, returned demonstration, and needs further education  HOME EXERCISE PROGRAM: Initiated seated PWR! Up with handouts-perform 1-2x/daily, 5-10 reps  GOALS: Goals reviewed with patient? Yes  SHORT TERM GOALS: Target date: 05/17/2023  Pt will be independent with HEP for improved balance, strength, gait. Baseline: Goal status: IN PROGRESS  2.  Pt will improve 5x sit<>stand to less than or equal to 15 sec to demonstrate improved functional strength and transfer efficiency. Baseline: 20.78 sec Goal status: IN PROGRESS  3.  Pt will verbalize understanding of local Parkinson's disease community resources.  Baseline: newly dx with PD, July 2024 Goal status: IN PROGRESS   LONG TERM GOALS: Target date: 06/14/2023  Pt will be independent with HEP for improved balance, strength, gait. Baseline:  Goal status: IN PROGRESS  2.  Pt will improve 5x sit<>stand to less than or equal to 12.5 sec to demonstrate improved functional strength and transfer efficiency. Baseline: 20.78 sec Goal status: IN PROGRESS  3.  Pt will improve DGI score to at least 19/24 to decrease fall risk. Baseline: 14/24 Goal status: IN PROGRESS  4.  Pt will verbalize plans for continued community fitness upon d/c from PT to maximize gains made in PT. Baseline:  Goal status:IN PROGRESS  ASSESSMENT:  CLINICAL IMPRESSION: Patient arrived to session without complaints. Reviewed seated PWR! Moves while providing cueing for movement pattern, coordination. Patient performed these well but struggled somewhat with coordination and set switching. STS transfers required drills to promote anterior trunk lean. Patient requesting OT referral d/t tremor and  trouble with hand dexterity- will request from referring provider. No complaints at end of session.   OBJECTIVE IMPAIRMENTS: Abnormal gait, decreased balance, decreased mobility, difficulty walking, decreased strength, impaired tone, and postural dysfunction.   ACTIVITY LIMITATIONS: carrying, bending, standing, transfers, reach over head, and locomotion level  PARTICIPATION LIMITATIONS: meal prep, cleaning, laundry, driving, shopping, and community activity  PERSONAL FACTORS: 3+ comorbidities: see above; pt newly diagnosed with PD < 1 week ago  are also affecting patient's functional outcome.   REHAB POTENTIAL: Good  CLINICAL DECISION MAKING: Evolving/moderate complexity  EVALUATION COMPLEXITY: Moderate  PLAN:  PT FREQUENCY: 2x/week  PT DURATION: 8 weeks, including eval week  PLANNED INTERVENTIONS: Therapeutic exercises, Therapeutic activity, Neuromuscular re-education, Balance training, Gait training, Patient/Family education, Self Care, and DME instructions  PLAN FOR NEXT SESSION: reviewHEP; work on standing PWR! Moves, gait and balance activities to incorporate RUE arm swing, sit<>Stand    Anette Guarneri, PT, DPT 04/25/23 4:19 PM  [facility] at [facility] [address] [place], [address] [place], [place] [address] Phone # [phone] Fax # [phone]

## 2023-04-25 ENCOUNTER — Encounter: Payer: Self-pay | Admitting: Physical Therapy

## 2023-04-25 ENCOUNTER — Ambulatory Visit: Payer: Medicare Other | Admitting: Physical Therapy

## 2023-04-25 DIAGNOSIS — R29818 Other symptoms and signs involving the nervous system: Secondary | ICD-10-CM | POA: Diagnosis not present

## 2023-04-25 DIAGNOSIS — M6281 Muscle weakness (generalized): Secondary | ICD-10-CM

## 2023-04-25 DIAGNOSIS — R2689 Other abnormalities of gait and mobility: Secondary | ICD-10-CM | POA: Diagnosis not present

## 2023-04-25 DIAGNOSIS — R293 Abnormal posture: Secondary | ICD-10-CM

## 2023-04-25 DIAGNOSIS — R278 Other lack of coordination: Secondary | ICD-10-CM | POA: Diagnosis not present

## 2023-04-25 DIAGNOSIS — R2681 Unsteadiness on feet: Secondary | ICD-10-CM

## 2023-04-25 NOTE — Patient Instructions (Signed)
Sit to Stand Transfers:  Scoot out to the edge of the chair Place your feet flat on the floor, shoulder width apart.  Make sure your feet are tucked just under your knees. Lean forward (nose over toes) with momentum, and stand up tall with your best posture.  If you need to use your arms, use them as a quick boost up to stand. If you are in a low or soft chair, you can lean back and then forward up to stand, in order to get more momentum. Once you are standing, make sure you are looking ahead and standing tall.  To sit down:  Back up until you feel the chair behind your legs. Bend at your hips, reaching  Back for you chair, if needed, then slowly squat to sit down on your chair.  

## 2023-04-26 ENCOUNTER — Telehealth: Payer: Self-pay | Admitting: Physical Therapy

## 2023-04-26 ENCOUNTER — Other Ambulatory Visit: Payer: Self-pay | Admitting: Neurology

## 2023-04-26 DIAGNOSIS — G20A1 Parkinson's disease without dyskinesia, without mention of fluctuations: Secondary | ICD-10-CM

## 2023-04-26 NOTE — Telephone Encounter (Signed)
Dr. Arbutus Leas,  Ms. Pogorzelski is being seen in OPPT for treatment of Parkinson's. The patient would benefit from OT evaluation for tremor and difficulty with fine motor control.    If you agree, please place an order in [facility] in [facility] or fax the order to [phone].  Thank you,  Anette Guarneri, PT, DPT 04/26/23 1:03 PM  [facility] at [facility] [address] [place], [address] [place], [place] [address] Phone # [phone] Fax # [phone]

## 2023-04-29 NOTE — Therapy (Signed)
OUTPATIENT PHYSICAL THERAPY NEURO TREATMENT   Patient Name: Andrea Burke MRN: [id number] DOB:07-31-1946, 77 y.o., female Today's Date: 04/30/2023   PCP: Catha Gosselin, MD REFERRING PROVIDER: Vladimir Faster, DO   END OF SESSION:  PT End of Session - 04/30/23 1531     Visit Number 3    Number of Visits 16    Date for PT Re-Evaluation 06/14/23    Authorization Type Medicare/BCBS    Progress Note Due on Visit 10    PT Start Time 1447    PT Stop Time 1531    PT Time Calculation (min) 44 min    Equipment Utilized During Treatment Gait belt    Activity Tolerance Patient tolerated treatment well    Behavior During Therapy WFL for tasks assessed/performed               Past Medical History:  Diagnosis Date   Cancer (HCC)    KIDNEY   Discoid lupus    Endometriosis    Headache(784.0)    Insomnia    Kidney disease    Lupus (HCC)    Osteopenia    Urinary incontinence    Past Surgical History:  Procedure Laterality Date   CATARACT EXTRACTION, BILATERAL     PELVIC LAPAROSCOPY     DIAG LASER LAP-ENDOMETRIOSIS   REMOVAL OF LEFT KIDNEY  10/08/2006   Patient Active Problem List   Diagnosis Date Noted   Major depressive disorder, single episode, moderate (HCC) 08/06/2018   Moderate major depression (HCC) 06/23/2018   Anxiety disorder 06/23/2018   Pruritus 04/01/2017   Onychomycosis 05/12/2012   Lentigines 05/12/2012   Discoid lupus    Cancer (HCC)    Headache(784.0)    Insomnia    Endometriosis    Osteopenia     ONSET DATE: 04/17/2023 (MD referral)  REFERRING DIAG: G20.A1 (ICD-10-CM) - Parkinson's disease without dyskinesia or fluctuating manifestations   THERAPY DIAG:  Other abnormalities of gait and mobility  Unsteadiness on feet  Abnormal posture  Other symptoms and signs involving the nervous system  Muscle weakness (generalized)  Rationale for Evaluation and Treatment: Rehabilitation  SUBJECTIVE:                                                                                                                                                                                              SUBJECTIVE STATEMENT: Started feeling a couple twinges in her back (recalls getting back PT last year) while performing HEP but not sure if she was doing it correctly.   Pt accompanied by: self and significant other  PERTINENT HISTORY: recent dx of PD, memory loss,  depression  PAIN:  Are you having pain? No  PRECAUTIONS: Fall  RED FLAGS: None   WEIGHT BEARING RESTRICTIONS: No  FALLS: Has patient fallen in last 6 months? No  LIVING ENVIRONMENT: Lives with: lives with their spouse Lives in: House/apartment Stairs: Yes: External: 2 steps; on left going up Has following equipment at home: None  PLOF: Independent  Enjoys volunteering at [employer]; enjoys travelling.  PATIENT GOALS: Want to do my activities without falling and continue to travel  OBJECTIVE:    TODAY'S TREATMENT: 04/30/23 Activity Comments  reviewed sitting PWR moves  PWR! Up 10x  PWR! Rock 10x  PWR! Twist 10x (require isolation of hip rotation first before adding in UEs)  PWR! Step 5x Pt reports forward trunk lean causes pulling in the LB- encouraged smaller trunk flexion. Reported some pulling in LB with rock.  C/o medial L knee when performing PWR! Step with R leg, thus practiced L PWR! Step only    review STS 10x Review of set up; performed with good form  standing PWR moves  PWR! Up 10x  PWR! Rock 10x   PWR! Twist 10x   PWR! Step 10x  C/o L medial knee pain with PWR  up but otherwise reported improved tolerance compared to sitting version ; in II bars for safety     PATIENT EDUCATION: Education details: answered pt's questions on PT vs OT and PD related sx, exercise importance for dopamine production, and typical course of treatment for PD in PT; provided cues/tips on HEP and recommended ice application to L medial knee 10 min; HEP updated  Person  educated: Patient Education method: Explanation, Demonstration, Tactile cues, Verbal cues, and Handouts Education comprehension: verbalized understanding and returned demonstration    HOME EXERCISE PROGRAM Last updated: 04/25/23 Access Code: [email or web address] URL: [email or web address] Date: 04/25/2023 Prepared by: [facility] - Outpatient  Rehab - [facility]  Exercises - Sit to Stand Without Arm Support  - 1 x daily - 5 x weekly - 2 sets - 10 reps  ALSO: seated PWR! Up with handouts-perform 1-2x/daily, 5-10 reps ALSO: standing PWR! Up with handouts-perform 1-2x/daily, 5-10 reps    Below measures were taken at time of initial evaluation unless otherwise specified:   DIAGNOSTIC FINDINGS: NA for this episode  COGNITION: Overall cognitive status: Within functional limits for tasks assessed and reports fear and embarrassment in regards to tremors   SENSATION: WFL  MUSCLE TONE: RLE: Mild   POSTURE: rounded shoulders, forward head, and holds RUE in shoulder internal rotation, elbow flexion, fisted hand (able to extend and open up with cues)  LOWER EXTREMITY ROM:   WFL BLEs in sitting   LOWER EXTREMITY MMT:    MMT Right Eval Left Eval  Hip flexion 4 4  Hip extension    Hip abduction    Hip adduction    Hip internal rotation    Hip external rotation    Knee flexion 4 4  Knee extension 4+ 4  Ankle dorsiflexion 4+ 4  Ankle plantarflexion    Ankle inversion    Ankle eversion    (Blank rows = not tested)   TRANSFERS: Assistive device utilized: None  Sit to stand: SBA Stand to sit: SBA STAIRS: Level of Assistance: Modified independence Stair Negotiation Technique: Alternating Pattern  with Single Rail on Right Number of Stairs: 2-3  Height of Stairs: 4-6"    GAIT: Gait pattern:  holds RUE with LUE to prevent increased tremors, step through pattern, decreased arm swing- Left, decreased  step length- Right, decreased trunk rotation, and narrow  BOS Distance walked: 50 ft x 4 reps Assistive device utilized: None Level of assistance: SBA   FUNCTIONAL TESTS:  5 times sit to stand: 20.78 sec with arms crossed at ches Timed up and go (TUG): 12.31 sec Dynamic Gait Index: 14/24 TUG cognitive:11.32 sec (slowed counting) 79M walk: 10.12 sec (3.24 ft/sec)      TODAY'S TREATMENT:                                                                                                                              DATE: 04/22/2023    PATIENT EDUCATION: Education details: Eval results, POC, rationale for OT to address RUE tremor and bradykinesia; seated PWR! UP initiated for HEP Person educated: Patient and Spouse Education method: Explanation, Demonstration, and Handouts Education comprehension: verbalized understanding, returned demonstration, and needs further education  HOME EXERCISE PROGRAM: Initiated seated PWR! Up with handouts-perform 1-2x/daily, 5-10 reps  GOALS: Goals reviewed with patient? Yes  SHORT TERM GOALS: Target date: 05/17/2023  Pt will be independent with HEP for improved balance, strength, gait. Baseline: Goal status: IN PROGRESS  2.  Pt will improve 5x sit<>stand to less than or equal to 15 sec to demonstrate improved functional strength and transfer efficiency. Baseline: 20.78 sec Goal status: IN PROGRESS  3.  Pt will verbalize understanding of local Parkinson's disease community resources.  Baseline: newly dx with PD, July 2024 Goal status: IN PROGRESS   LONG TERM GOALS: Target date: 06/14/2023  Pt will be independent with HEP for improved balance, strength, gait. Baseline:  Goal status: IN PROGRESS  2.  Pt will improve 5x sit<>stand to less than or equal to 12.5 sec to demonstrate improved functional strength and transfer efficiency. Baseline: 20.78 sec Goal status: IN PROGRESS  3.  Pt will improve DGI score to at least 19/24 to decrease fall risk. Baseline: 14/24 Goal status: IN PROGRESS  4.  Pt  will verbalize plans for continued community fitness upon d/c from PT to maximize gains made in PT. Baseline:  Goal status:IN PROGRESS  ASSESSMENT:  CLINICAL IMPRESSION: Patient arrived to session with c/o some mild LBP from [facility]! Moves performed last session. Provided some modifications to HEP to reduced LB and L knee pain during activities. Patient with trouble coordinating activities and set switching, requiring repeated review and demo. Standing PWR moves were initiated and were tolerated with less c/o pain, thus updated these into HEP. Patient reported understanding and without complaints upon leaving.   OBJECTIVE IMPAIRMENTS: Abnormal gait, decreased balance, decreased mobility, difficulty walking, decreased strength, impaired tone, and postural dysfunction.   ACTIVITY LIMITATIONS: carrying, bending, standing, transfers, reach over head, and locomotion level  PARTICIPATION LIMITATIONS: meal prep, cleaning, laundry, driving, shopping, and community activity  PERSONAL FACTORS: 3+ comorbidities: see above; pt newly diagnosed with PD < 1 week ago  are also affecting patient's functional outcome.   REHAB POTENTIAL: Good  CLINICAL DECISION MAKING: Evolving/moderate  complexity  EVALUATION COMPLEXITY: Moderate  PLAN:  PT FREQUENCY: 2x/week  PT DURATION: 8 weeks, including eval week  PLANNED INTERVENTIONS: Therapeutic exercises, Therapeutic activity, Neuromuscular re-education, Balance training, Gait training, Patient/Family education, Self Care, and DME instructions  PLAN FOR NEXT SESSION: reviewHEP; work on standing PWR! Moves, gait and balance activities to incorporate RUE arm swing, sit<>Stand    Anette Guarneri, PT, DPT 04/30/23 3:31 PM  [facility] at [facility] [address] [place], [address] [place], [place] [address] Phone # [phone] Fax # [phone]

## 2023-04-30 ENCOUNTER — Encounter: Payer: Self-pay | Admitting: Physical Therapy

## 2023-04-30 ENCOUNTER — Ambulatory Visit: Payer: Medicare Other | Admitting: Physical Therapy

## 2023-04-30 DIAGNOSIS — R293 Abnormal posture: Secondary | ICD-10-CM

## 2023-04-30 DIAGNOSIS — R2689 Other abnormalities of gait and mobility: Secondary | ICD-10-CM

## 2023-04-30 DIAGNOSIS — R29818 Other symptoms and signs involving the nervous system: Secondary | ICD-10-CM

## 2023-04-30 DIAGNOSIS — M6281 Muscle weakness (generalized): Secondary | ICD-10-CM | POA: Diagnosis not present

## 2023-04-30 DIAGNOSIS — R2681 Unsteadiness on feet: Secondary | ICD-10-CM | POA: Diagnosis not present

## 2023-04-30 DIAGNOSIS — R278 Other lack of coordination: Secondary | ICD-10-CM | POA: Diagnosis not present

## 2023-05-01 ENCOUNTER — Ambulatory Visit (INDEPENDENT_AMBULATORY_CARE_PROVIDER_SITE_OTHER): Payer: Medicare Other | Admitting: Psychiatry

## 2023-05-01 DIAGNOSIS — F419 Anxiety disorder, unspecified: Secondary | ICD-10-CM | POA: Diagnosis not present

## 2023-05-01 NOTE — Progress Notes (Signed)
[facility] Counselor/Therapist Progress Note  Patient ID: BRIAN KOCOUREK, MRN: [id number],    Date: 05/01/2023  Time Spent: 48 minutes   Treatment Type: Individual Therapy  Reported Symptoms: anxiety, depression, states she feels she is trying to accept more about her recent dx of Parkinson's, and trying to follow through on recommendations from her neurologist Dr. Arbutus Leas  Mental Status Exam:  Appearance:   Casual     Behavior:  Appropriate, Sharing, and Motivated  Motor:  Tremor  Speech/Language:   Clear and Coherent  Affect:  Depressed and anxious, tearful  Mood:  anxious, depressed, and sad  Thought process:  goal directed  Thought content:    WNL  Sensory/Perceptual disturbances:    WNL  Orientation:  oriented to person, place, time/date, situation, day of week, month of year, year, and stated date of May 01, 2023  Attention:  Good  Concentration:  Good and Fair  Memory:  Some short term forgetting and Dr is aware  Fund of knowledge:   Good and Fair  Insight:    Good  Judgment:   Good  Impulse Control:  Good   Risk Assessment: Danger to Self:  No Self-injurious Behavior: No Danger to Others: No Duty to Warn:no Physical Aggression / Violence:No  Access to Firearms a concern: No  Gang Involvement:No   Subjective: Patient today in session and reporting anxiety and depression both related to her diagnosis of Parkinson's. States she still is having some fears about this but they are not as over-whelming currently. Reports her mood fluctuates depending on the day and what's going on--"some ok days and some not so ok days." Is learning not to seek a lot of medical information online and that just feeds her worrying and anxiety/depression. Also focusing on enjoying time with her " flowers/yard outside if it's not too hot."  Patient was able to talk more openly today about some of the fears with her Parkinson's and relationships within the family which seem to be very  helpful to her.  Working on Research scientist (life sciences)" as discussed in session today and will follow-up within 2 to 3 weeks.  Encouraged her to continue not assuming worst-case scenarios and encouraging her not to search online so much about her diagnosis and instead ask her neurologist when she has questions.  Interventions: Cognitive Behavioral Therapy and Ego-Supportive  Long-Term goal: (measurable)  Enhance the ability to handle effectively the full variety of life's anxieties. Patient will eventually rate herself as a "3 or under on a 1-10 scale for depression and for anxiety, for a period of at least 2 months."  Strategies:  Patient will work on re-framing thoughts that have been leading her to feel more anxious.  The re-framed thoughts will support more calmness and personal empowerment for patient.     Short Term goal:  Increase understanding of beliefs and messages that produce worry and anxiety. Strategies: Patient will work with therapist to gain more understanding of how anxiety and worry are supported by certain beliefs and messages.  She will be able to give at least 3 examples of such messages in an upcoming session.  Diagnosis:   ICD-10-CM   1. Anxiety disorder, unspecified type  F41.9      Plan:  Patient in session today working further on her fears, anxiety, depression, all related to her recently being diagnosed with Parkinson's disease which she had been fearful earlier that this would be her diagnosis.  Last session she was able to  talk through some of her fears and anxieties and receive support and we continue with some of this today as she is experiencing understandable fears although trying to help her stay in the present and not jump too far ahead.  She has learned of her diagnosis which is confirmed by her neurologist recently.  Processed more of her thoughts and feelings today in session and really seem to appreciate the support and encouragement for her to vent what all she is  feeling and experiencing as a result of her diagnosis.  States her husband is not very expressive in his support and we discussed how he may be trying to manage some of his own feelings about her diagnosis.  Did encourage her not to "assume worst-case scenarios" and try to stay more in the present, following through on all doctor recommendations for her, and she seemed to be comforted by our conversation.  She has been making progress in her treatment goals and needs to continue with goal-directed behaviors to keep the progress she has made and keep moving in a forward direction is much as possible. Encouraged patient in her practice of more positive and self affirming behaviors as noted in session including: Believing more in her ability to make changes and live life more fully in the present, trust her ability to follow through on recommendations per her neurologist regarding coping with Parkinson symptoms, positive self talk, trying to refrain from assuming worst-case scenarios, limit her watching of distressing news on TV or online, spending meaningful time with her family and friends, maintain contact with people who are supportive, continue on her prescribed medications, and recognize the strengths she shows working with goal-directed behaviors to move in a direction that supports her improved emotional health and outlook into the future.  Self rating scales:(May 01, 2023) 1-10 depression scale-6/7 1-10 anxiety scale-8 1-10 self-esteem scale-5/6 1-10 motivation scale-5/6 1-10 hopefulness scale-5/6  Goal review and progress/challenges noted with patient.  Next appointment within 2 to 3 weeks.   Mathis Fare, LCSW

## 2023-05-01 NOTE — Therapy (Signed)
OUTPATIENT PHYSICAL THERAPY NEURO TREATMENT   Patient Name: Andrea Burke MRN: [id number] DOB:[birth year], 77 y.o., female Today's Date: 05/02/2023   PCP: Catha Gosselin, MD REFERRING PROVIDER: Vladimir Faster, DO   END OF SESSION:  PT End of Session - 05/02/23 1013     Visit Number 4    Number of Visits 16    Date for PT Re-Evaluation 06/14/23    Authorization Type Medicare/BCBS    Progress Note Due on Visit 10    PT Start Time 0928    PT Stop Time 1014    PT Time Calculation (min) 46 min    Equipment Utilized During Treatment Gait belt    Activity Tolerance Patient tolerated treatment well    Behavior During Therapy WFL for tasks assessed/performed                Past Medical History:  Diagnosis Date   Cancer (HCC)    KIDNEY   Discoid lupus    Endometriosis    Headache(784.0)    Insomnia    Kidney disease    Lupus (HCC)    Osteopenia    Urinary incontinence    Past Surgical History:  Procedure Laterality Date   CATARACT EXTRACTION, BILATERAL     PELVIC LAPAROSCOPY     DIAG LASER LAP-ENDOMETRIOSIS   REMOVAL OF LEFT KIDNEY  10/08/2006   Patient Active Problem List   Diagnosis Date Noted   Major depressive disorder, single episode, moderate (HCC) 08/06/2018   Moderate major depression (HCC) 06/23/2018   Anxiety disorder 06/23/2018   Pruritus 04/01/2017   Onychomycosis 05/12/2012   Lentigines 05/12/2012   Discoid lupus    Cancer (HCC)    Headache(784.0)    Insomnia    Endometriosis    Osteopenia     ONSET DATE: 04/17/2023 (MD referral)  REFERRING DIAG: G20.A1 (ICD-10-CM) - Parkinson's disease without dyskinesia or fluctuating manifestations   THERAPY DIAG:  Other abnormalities of gait and mobility  Unsteadiness on feet  Abnormal posture  Other symptoms and signs involving the nervous system  Muscle weakness (generalized)  Rationale for Evaluation and Treatment: Rehabilitation  SUBJECTIVE:                                                                                                                                                                                              SUBJECTIVE STATEMENT: Pretty good. Nothing new.   Pt accompanied by: self and significant other  PERTINENT HISTORY: recent dx of PD, memory loss, depression  PAIN:  Are you having pain? No  PRECAUTIONS: Fall  RED FLAGS: None   WEIGHT BEARING RESTRICTIONS:  No  FALLS: Has patient fallen in last 6 months? No  LIVING ENVIRONMENT: Lives with: lives with their spouse Lives in: House/apartment Stairs: Yes: External: 2 steps; on left going up Has following equipment at home: None  PLOF: Independent  Enjoys volunteering at [employer]; enjoys travelling.  PATIENT GOALS: Want to do my activities without falling and continue to travel  OBJECTIVE:     TODAY'S TREATMENT: 05/02/23 Activity Comments  Nustep L5 x 6 min  Cueing to maintain at least 75 SPM  review standing PWR moves  PWR! Up 10x   PWR! Rock 10x    PWR! Twist 10x    PWR! Step 10x  In II bars. Provided demo and cueing to maintain gaze on hand, hands outstretched. Tolerated without pain.   staggered ant/pos wt shift C/o "wobbly" but able to perform without UE support   backwards walk 4x60ft CGA; cueing for large diagonal steps to widen BOS, pause after each step to control acceleration   gait training with 7# in L hand x148ft To promote arm swing; pt performing same arm/same leg   gait + intentional arm swing with ski poles x150 ft Min A from PT to promote alt reciprocal arm swing     PATIENT EDUCATION: Education details: edu on PD community resources  Person educated: Patient Education method: Programmer, multimedia, Demonstration, Actor cues, Verbal cues, and Handouts Education comprehension: verbalized understanding and returned demonstration    HOME EXERCISE PROGRAM Last updated: 04/25/23 Access Code: [email or web address] URL: [email or web address] Date:  04/25/2023 Prepared by: [facility] - Outpatient  Rehab - [facility]  Exercises - Sit to Stand Without Arm Support  - 1 x daily - 5 x weekly - 2 sets - 10 reps  ALSO: seated PWR! Up with handouts-perform 1-2x/daily, 5-10 reps ALSO: standing PWR! Up with handouts-perform 1-2x/daily, 5-10 reps    Below measures were taken at time of initial evaluation unless otherwise specified:   DIAGNOSTIC FINDINGS: NA for this episode  COGNITION: Overall cognitive status: Within functional limits for tasks assessed and reports fear and embarrassment in regards to tremors   SENSATION: WFL  MUSCLE TONE: RLE: Mild   POSTURE: rounded shoulders, forward head, and holds RUE in shoulder internal rotation, elbow flexion, fisted hand (able to extend and open up with cues)  LOWER EXTREMITY ROM:   WFL BLEs in sitting   LOWER EXTREMITY MMT:    MMT Right Eval Left Eval  Hip flexion 4 4  Hip extension    Hip abduction    Hip adduction    Hip internal rotation    Hip external rotation    Knee flexion 4 4  Knee extension 4+ 4  Ankle dorsiflexion 4+ 4  Ankle plantarflexion    Ankle inversion    Ankle eversion    (Blank rows = not tested)   TRANSFERS: Assistive device utilized: None  Sit to stand: SBA Stand to sit: SBA STAIRS: Level of Assistance: Modified independence Stair Negotiation Technique: Alternating Pattern  with Single Rail on Right Number of Stairs: 2-3  Height of Stairs: 4-6"    GAIT: Gait pattern:  holds RUE with LUE to prevent increased tremors, step through pattern, decreased arm swing- Left, decreased step length- Right, decreased trunk rotation, and narrow BOS Distance walked: 50 ft x 4 reps Assistive device utilized: None Level of assistance: SBA   FUNCTIONAL TESTS:  5 times sit to stand: 20.78 sec with arms crossed at ches Timed up and go (TUG): 12.31 sec Dynamic Gait Index: 14/24  TUG cognitive:11.32 sec (slowed counting) 48M walk: 10.12 sec (3.24  ft/sec)      TODAY'S TREATMENT:                                                                                                                              DATE: 04/22/2023    PATIENT EDUCATION: Education details: Eval results, POC, rationale for OT to address RUE tremor and bradykinesia; seated PWR! UP initiated for HEP Person educated: Patient and Spouse Education method: Explanation, Demonstration, and Handouts Education comprehension: verbalized understanding, returned demonstration, and needs further education  HOME EXERCISE PROGRAM: Initiated seated PWR! Up with handouts-perform 1-2x/daily, 5-10 reps  GOALS: Goals reviewed with patient? Yes  SHORT TERM GOALS: Target date: 05/17/2023  Pt will be independent with HEP for improved balance, strength, gait. Baseline: Goal status: IN PROGRESS  2.  Pt will improve 5x sit<>stand to less than or equal to 15 sec to demonstrate improved functional strength and transfer efficiency. Baseline: 20.78 sec Goal status: IN PROGRESS  3.  Pt will verbalize understanding of local Parkinson's disease community resources.  Baseline: newly dx with PD, July 2024 Goal status: IN PROGRESS   LONG TERM GOALS: Target date: 06/14/2023  Pt will be independent with HEP for improved balance, strength, gait. Baseline:  Goal status: IN PROGRESS  2.  Pt will improve 5x sit<>stand to less than or equal to 12.5 sec to demonstrate improved functional strength and transfer efficiency. Baseline: 20.78 sec Goal status: IN PROGRESS  3.  Pt will improve DGI score to at least 19/24 to decrease fall risk. Baseline: 14/24 Goal status: IN PROGRESS  4.  Pt will verbalize plans for continued community fitness upon d/c from PT to maximize gains made in PT. Baseline:  Goal status:IN PROGRESS  ASSESSMENT:  CLINICAL IMPRESSION: Patient arrived to session without complaints. Reviewed standing PWR! Moves for max carryover and comfort. Patient demonstrated good  stability and tolerance for these activities. Proceeded with standing balance activities, focusing on weight shifting, multidirectional stepping, and arm swing with gait. Patient hesitant with backwards walking today and required cues to widen BOS and step length. Difficulty initiating alternating reciprocal arm swing but appeared to improve with some manual cueing. Patient tolerated session well and without complaints upon leaving.   OBJECTIVE IMPAIRMENTS: Abnormal gait, decreased balance, decreased mobility, difficulty walking, decreased strength, impaired tone, and postural dysfunction.   ACTIVITY LIMITATIONS: carrying, bending, standing, transfers, reach over head, and locomotion level  PARTICIPATION LIMITATIONS: meal prep, cleaning, laundry, driving, shopping, and community activity  PERSONAL FACTORS: 3+ comorbidities: see above; pt newly diagnosed with PD < 1 week ago  are also affecting patient's functional outcome.   REHAB POTENTIAL: Good  CLINICAL DECISION MAKING: Evolving/moderate complexity  EVALUATION COMPLEXITY: Moderate  PLAN:  PT FREQUENCY: 2x/week  PT DURATION: 8 weeks, including eval week  PLANNED INTERVENTIONS: Therapeutic exercises, Therapeutic activity, Neuromuscular re-education, Balance training, Gait training, Patient/Family education, Self Care, and DME instructions  PLAN  FOR NEXT SESSION: work on standing PWR! Moves, gait and balance activities to incorporate RUE arm swing, sit<>Stand    Anette Guarneri, PT, DPT 05/02/23 10:15 AM  [facility] at [facility] [address] [place], [address] [place], [place] [address] Phone # [phone] Fax # [phone]

## 2023-05-02 ENCOUNTER — Ambulatory Visit: Payer: Medicare Other | Admitting: Occupational Therapy

## 2023-05-02 ENCOUNTER — Encounter: Payer: Self-pay | Admitting: Physical Therapy

## 2023-05-02 ENCOUNTER — Ambulatory Visit: Payer: Medicare Other | Admitting: Physical Therapy

## 2023-05-02 ENCOUNTER — Other Ambulatory Visit: Payer: Self-pay

## 2023-05-02 DIAGNOSIS — R2689 Other abnormalities of gait and mobility: Secondary | ICD-10-CM | POA: Diagnosis not present

## 2023-05-02 DIAGNOSIS — M6281 Muscle weakness (generalized): Secondary | ICD-10-CM

## 2023-05-02 DIAGNOSIS — R2681 Unsteadiness on feet: Secondary | ICD-10-CM | POA: Diagnosis not present

## 2023-05-02 DIAGNOSIS — R278 Other lack of coordination: Secondary | ICD-10-CM | POA: Diagnosis not present

## 2023-05-02 DIAGNOSIS — R29818 Other symptoms and signs involving the nervous system: Secondary | ICD-10-CM

## 2023-05-02 DIAGNOSIS — R293 Abnormal posture: Secondary | ICD-10-CM

## 2023-05-02 DIAGNOSIS — M25611 Stiffness of right shoulder, not elsewhere classified: Secondary | ICD-10-CM

## 2023-05-02 NOTE — Therapy (Signed)
OUTPATIENT PHYSICAL THERAPY NEURO TREATMENT   Patient Name: Andrea Burke MRN: [id number] DOB:[birth year], 77 y.o., female Today's Date: 05/06/2023   PCP: Catha Gosselin, MD REFERRING PROVIDER: Vladimir Faster, DO   END OF SESSION:  PT End of Session - 05/06/23 1101     Visit Number 5    Number of Visits 16    Date for PT Re-Evaluation 06/14/23    Authorization Type Medicare/BCBS    Progress Note Due on Visit 10    PT Start Time 1018    PT Stop Time 1059    PT Time Calculation (min) 41 min    Equipment Utilized During Treatment Gait belt    Activity Tolerance Patient tolerated treatment well    Behavior During Therapy WFL for tasks assessed/performed                 Past Medical History:  Diagnosis Date   Cancer (HCC)    KIDNEY   Discoid lupus    Endometriosis    Headache(784.0)    Insomnia    Kidney disease    Lupus (HCC)    Osteopenia    Urinary incontinence    Past Surgical History:  Procedure Laterality Date   CATARACT EXTRACTION, BILATERAL     PELVIC LAPAROSCOPY     DIAG LASER LAP-ENDOMETRIOSIS   REMOVAL OF LEFT KIDNEY  10/08/2006   Patient Active Problem List   Diagnosis Date Noted   Major depressive disorder, single episode, moderate (HCC) 08/06/2018   Moderate major depression (HCC) 06/23/2018   Anxiety disorder 06/23/2018   Pruritus 04/01/2017   Onychomycosis 05/12/2012   Lentigines 05/12/2012   Discoid lupus    Cancer (HCC)    Headache(784.0)    Insomnia    Endometriosis    Osteopenia     ONSET DATE: 04/17/2023 (MD referral)  REFERRING DIAG: G20.A1 (ICD-10-CM) - Parkinson's disease without dyskinesia or fluctuating manifestations   THERAPY DIAG:  Other symptoms and signs involving the nervous system  Muscle weakness (generalized)  Abnormal posture  Unsteadiness on feet  Other abnormalities of gait and mobility  Rationale for Evaluation and Treatment: Rehabilitation  SUBJECTIVE:                                                                                                                                                                                              SUBJECTIVE STATEMENT: Nothing new. No questions on HEP.   Pt accompanied by: self and significant other  PERTINENT HISTORY: recent dx of PD, memory loss, depression  PAIN:  Are you having pain? No  PRECAUTIONS: Fall  RED FLAGS: None  WEIGHT BEARING RESTRICTIONS: No  FALLS: Has patient fallen in last 6 months? No  LIVING ENVIRONMENT: Lives with: lives with their spouse Lives in: House/apartment Stairs: Yes: External: 2 steps; on left going up Has following equipment at home: None  PLOF: Independent  Enjoys volunteering at [employer]; enjoys travelling.  PATIENT GOALS: Want to do my activities without falling and continue to travel  OBJECTIVE:     TODAY'S TREATMENT: 05/06/23 Activity Comments  Nustep L5 x 6 min UEs/LEs  Cueing to maintain at least 75 SPM  staggered ant/pos wt shift + arm swing In front of mirror; chair support occasionally and CGA-min A occasionally d/t imbalance; cueing for wider BOS  standing EC incline/decline 2x30" each More difficulty on incline d/t posterior sway  Rockerboard ant/pos static and dynamic as well as with perturbations  Heavy focus on use of ankle and hip strategy to recover posterior LOB; improved stability and recovery after practice   sidestepping over hurdles In II bars; cueing for safe foot placement and wt shifting onto stabilizing LE    PATIENT EDUCATION: Education details: edu on Movement Disorders social gathering on Aug 8th Person educated: Patient Education method: Explanation and Handouts Education comprehension: verbalized understanding   HOME EXERCISE PROGRAM Last updated: 04/25/23 Access Code: [email or web address] URL: [email or web address] Date: 04/25/2023 Prepared by: [facility] - Outpatient  Rehab - [facility]  Exercises - Sit to Stand Without Arm Support   - 1 x daily - 5 x weekly - 2 sets - 10 reps  ALSO: seated PWR! Up with handouts-perform 1-2x/daily, 5-10 reps ALSO: standing PWR! Up with handouts-perform 1-2x/daily, 5-10 reps    Below measures were taken at time of initial evaluation unless otherwise specified:   DIAGNOSTIC FINDINGS: NA for this episode  COGNITION: Overall cognitive status: Within functional limits for tasks assessed and reports fear and embarrassment in regards to tremors   SENSATION: WFL  MUSCLE TONE: RLE: Mild   POSTURE: rounded shoulders, forward head, and holds RUE in shoulder internal rotation, elbow flexion, fisted hand (able to extend and open up with cues)  LOWER EXTREMITY ROM:   WFL BLEs in sitting   LOWER EXTREMITY MMT:    MMT Right Eval Left Eval  Hip flexion 4 4  Hip extension    Hip abduction    Hip adduction    Hip internal rotation    Hip external rotation    Knee flexion 4 4  Knee extension 4+ 4  Ankle dorsiflexion 4+ 4  Ankle plantarflexion    Ankle inversion    Ankle eversion    (Blank rows = not tested)   TRANSFERS: Assistive device utilized: None  Sit to stand: SBA Stand to sit: SBA STAIRS: Level of Assistance: Modified independence Stair Negotiation Technique: Alternating Pattern  with Single Rail on Right Number of Stairs: 2-3  Height of Stairs: 4-6"    GAIT: Gait pattern:  holds RUE with LUE to prevent increased tremors, step through pattern, decreased arm swing- Left, decreased step length- Right, decreased trunk rotation, and narrow BOS Distance walked: 50 ft x 4 reps Assistive device utilized: None Level of assistance: SBA   FUNCTIONAL TESTS:  5 times sit to stand: 20.78 sec with arms crossed at ches Timed up and go (TUG): 12.31 sec Dynamic Gait Index: 14/24 TUG cognitive:11.32 sec (slowed counting) 19M walk: 10.12 sec (3.24 ft/sec)      TODAY'S TREATMENT:  DATE: 04/22/2023    PATIENT EDUCATION: Education details: Eval results, POC, rationale for OT to address RUE tremor and bradykinesia; seated PWR! UP initiated for HEP Person educated: Patient and Spouse Education method: Explanation, Demonstration, and Handouts Education comprehension: verbalized understanding, returned demonstration, and needs further education  HOME EXERCISE PROGRAM: Initiated seated PWR! Up with handouts-perform 1-2x/daily, 5-10 reps  GOALS: Goals reviewed with patient? Yes  SHORT TERM GOALS: Target date: 05/17/2023  Pt will be independent with HEP for improved balance, strength, gait. Baseline: Goal status: MET 05/06/23  2.  Pt will improve 5x sit<>stand to less than or equal to 15 sec to demonstrate improved functional strength and transfer efficiency. Baseline: 20.78 sec Goal status: IN PROGRESS  3.  Pt will verbalize understanding of local Parkinson's disease community resources.  Baseline: newly dx with PD, July 2024; provided 05/02/23 Goal status: MET 05/02/23   LONG TERM GOALS: Target date: 06/14/2023  Pt will be independent with HEP for improved balance, strength, gait. Baseline:  Goal status: IN PROGRESS  2.  Pt will improve 5x sit<>stand to less than or equal to 12.5 sec to demonstrate improved functional strength and transfer efficiency. Baseline: 20.78 sec Goal status: IN PROGRESS  3.  Pt will improve DGI score to at least 19/24 to decrease fall risk. Baseline: 14/24 Goal status: IN PROGRESS  4.  Pt will verbalize plans for continued community fitness upon d/c from PT to maximize gains made in PT. Baseline:  Goal status:IN PROGRESS  ASSESSMENT:  CLINICAL IMPRESSION: Patient arrived to session without complaints. Worked on dynamic balance activity including alternating weight shifts, uneven surfaces, and stepping over obstacles. Patient with tendency to demonstrate posterior LOB on inclined surface. Worked on  eliciting ankle and hip strategy to address this with improved ability to recovery balance after rockerboard practice. Good demonstration of adequate wt shift was performed with step overs. No complaints at end of session. Patient is progressing well.   OBJECTIVE IMPAIRMENTS: Abnormal gait, decreased balance, decreased mobility, difficulty walking, decreased strength, impaired tone, and postural dysfunction.   ACTIVITY LIMITATIONS: carrying, bending, standing, transfers, reach over head, and locomotion level  PARTICIPATION LIMITATIONS: meal prep, cleaning, laundry, driving, shopping, and community activity  PERSONAL FACTORS: 3+ comorbidities: see above; pt newly diagnosed with PD < 1 week ago  are also affecting patient's functional outcome.   REHAB POTENTIAL: Good  CLINICAL DECISION MAKING: Evolving/moderate complexity  EVALUATION COMPLEXITY: Moderate  PLAN:  PT FREQUENCY: 2x/week  PT DURATION: 8 weeks, including eval week  PLANNED INTERVENTIONS: Therapeutic exercises, Therapeutic activity, Neuromuscular re-education, Balance training, Gait training, Patient/Family education, Self Care, and DME instructions  PLAN FOR NEXT SESSION: work on standing PWR! Moves, gait and balance activities to incorporate RUE arm swing, sit<>Stand    Anette Guarneri, PT, DPT 05/06/23 11:01 AM  [place] at [facility] [address] [place], [address] [place], [place] [address] Phone # [phone] Fax # [phone]

## 2023-05-02 NOTE — Therapy (Signed)
OUTPATIENT OCCUPATIONAL THERAPY [facility] EVALUATION  Patient Name: Andrea Burke MRN: [id number] DOB:11-Dec-1945, 77 y.o., female Today's Date: 05/02/2023  PCP: Catha Gosselin, MD REFERRING PROVIDER: Vladimir Faster, DO  END OF SESSION:  OT End of Session - 05/02/23 0927     Visit Number 1    Number of Visits 17    Date for OT Re-Evaluation 06/28/23    Authorization Type Medicare A&B    OT Start Time 0848    OT Stop Time 0928    OT Time Calculation (min) 40 min             Past Medical History:  Diagnosis Date   Cancer (HCC)    KIDNEY   Discoid lupus    Endometriosis    Headache(784.0)    Insomnia    Kidney disease    Lupus (HCC)    Osteopenia    Urinary incontinence    Past Surgical History:  Procedure Laterality Date   CATARACT EXTRACTION, BILATERAL     PELVIC LAPAROSCOPY     DIAG LASER LAP-ENDOMETRIOSIS   REMOVAL OF LEFT KIDNEY  10/08/2006   Patient Active Problem List   Diagnosis Date Noted   Major depressive disorder, single episode, moderate (HCC) 08/06/2018   Moderate major depression (HCC) 06/23/2018   Anxiety disorder 06/23/2018   Pruritus 04/01/2017   Onychomycosis 05/12/2012   Lentigines 05/12/2012   Discoid lupus    Cancer (HCC)    Headache(784.0)    Insomnia    Endometriosis    Osteopenia     ONSET DATE: referral 04/26/23 (recent PD diagnosis)  REFERRING DIAG: G20.A1 (ICD-10-CM) - Parkinson's disease without dyskinesia or fluctuating manifestations  THERAPY DIAG:  Other symptoms and signs involving the nervous system  Muscle weakness (generalized)  Abnormal posture  Other lack of coordination  Stiffness of right shoulder, not elsewhere classified  Rationale for Evaluation and Treatment: Rehabilitation  SUBJECTIVE:   SUBJECTIVE STATEMENT: Pt reports PD diagnosis in May.  It started out with just slight tremors, mid-May (after episode of increased confusion and dehydration) pt started to experience increased jerking  episodes.Pt reports difficulty with handwriting, computer use, holding a cup of coffee, getting the key in the door. Pt accompanied by: self  PERTINENT HISTORY: recent dx of PD, memory loss, depression  PRECAUTIONS: Fall, back pain/muscle spasms  WEIGHT BEARING RESTRICTIONS: No  PAIN:  Are you having pain? No  FALLS: Has patient fallen in last 6 months? No  LIVING ENVIRONMENT: Lives with: lives with their spouse Lives in: House/apartment Stairs: Yes: External: 2-3 steps; on left going up Has following equipment at home: Grab bars  PLOF: Independent  Enjoys volunteering at [employer]; enjoys travelling.  PATIENT GOALS: control the arm/hand tremors with hand use  OBJECTIVE:   HAND DOMINANCE: Right  ADLs: Overall ADLs: Increased time, difficulty with clothing fasteners Transfers/ambulation related to ADLs: Independent Eating: increased spilling and dropping of foods UB Dressing: difficulty with buttons and other clothing fasteners LB Dressing: increased time and difficulty with clothing fasteners Toileting: occasional need to pull self up Bathing: Independent Tub Shower transfers: Mod I, holding on to door and grab bar when stepping in/out of shower  IADLs: Light housekeeping: increased tremors impacting washing dishes, folding laundry Meal Prep: completes with increased time/effort Community mobility: driving Medication management: difficulty with coordination, dropping pills occasionally.  Does keep a log to ensure recall of when taking medications Handwriting: 100% legible and PPT #1 (whales live in [place]): 14.72 sec, reports handwriting used  to be more rounded and occasionally "arm jerks" when writing.  MOBILITY STATUS: Independent  POSTURE COMMENTS:  rounded shoulders and forward head  ACTIVITY TOLERANCE: Activity tolerance: WFL for tasks assessed on eval  FUNCTIONAL OUTCOME MEASURES: Standing functional reach: Right: 3 inches; Left: 3  inches Fastening/unfastening 3 buttons: 37.40 sec Physical performance test: PPT#2 (simulated eating) 14.12 sec & PPT#4 (donning/doffing jacket): 13.25 sec  COORDINATION: 9 Hole Peg test: Right: 24.0 sec; Left: 26.62 sec Box and Blocks:  Right 52 blocks, Left 50 blocks Tremors: Resting, action, and Right  UE ROM:   decreased shoulder flexion bilaterally, however WFL;  hold RUE in shoulder internal rotation, decreased internal rotation bilaterally, limited to reaching to top of hips not able to reach mid back.  SENSATION: Reports intermittent light tingling in R hand  MUSCLE TONE: RUE: Mild and Hypertonic  COGNITION: Overall cognitive status: Within functional limits for tasks assessed   TODAY'S TREATMENT:                                                                                                                              DATE:  05/02/23 Eval only    PATIENT EDUCATION: Education details: Educated on role and purpose of OT as well as potential interventions and goals for therapy based on initial evaluation findings. Person educated: Patient Education method: Explanation Education comprehension: verbalized understanding and needs further education  HOME EXERCISE PROGRAM: TBD  GOALS: Goals reviewed with patient? Yes  SHORT TERM GOALS: Target date: 05/31/23  Pt will be independent with PD specific HEP. Baseline: Goal status: INITIAL  2.  Pt will verbalize understanding of task modifications/adaptive strategies and/or potential AE needs to increase ease, safety, and independence w/ ADLs/IADLs (as needed for medication management) Baseline:  Goal status: INITIAL  3.  Pt will verbalize understanding of ways to prevent future PD related complications and PD community resources. Baseline:  Goal status: INITIAL   LONG TERM GOALS: Target date: 06/28/23  Pt will write a short paragraph with 100% legibility and no significant decrease in letter size. Baseline:  Goal  status: INITIAL  2.  Pt will demonstrate ability to retrieve a lightweight object from moderate height self with increased trunk flexion, shoulder flexion, and elbow extension. Baseline: Standing functional reach: Right: 3 inches; Left: 3 inches Goal status: INITIAL  3.  Pt will demonstrate understanding of memory compensations and ways to keep thinking skills sharp Baseline:  Goal status: INITIAL  4.  Pt will demonstrate improved UE functional use for ADLs as evidenced by increasing box/ blocks score by 4 blocks with RUE/LUE Baseline: R: 52, L: 50 Goal status: INITIAL  5.  Pt will demonstrate improved ease with fastening buttons as evidenced by decreasing 3 button/ unbutton time to <30 seconds Baseline: 37.4 sec Goal status: INITIAL   ASSESSMENT:  CLINICAL IMPRESSION: Patient is a 77 y.o. female who was seen today for occupational therapy evaluation for bradykinesia, FMC, and tremor in RUE with  recent diagnosis of PD. Pt demonstrates tremor in dominant RUE impacting coordination as needed for ADLs and IADLs as well as handwriting/typing and functional reach.  Discussed purpose of therapy to address areas of concern as well as educate on compensatory strategies to aide in safety and independence.  Pt will benefit from skilled occupational therapy services to address strength and coordination, GM/FM control, introduction of compensatory strategies/AE prn, and implementation of an HEP to improve participation and safety during ADLs and IADLs and improved quality of life.    PERFORMANCE DEFICITS: in functional skills including ADLs, IADLs, coordination, tone, ROM, strength, flexibility, Fine motor control, Gross motor control, balance, body mechanics, endurance, decreased knowledge of precautions, decreased knowledge of use of DME, and UE functional use, cognitive skills including memory, problem solving, and safety awareness, and psychosocial skills including coping strategies and environmental  adaptation.   IMPAIRMENTS: are limiting patient from ADLs and IADLs.   COMORBIDITIES:  may have co-morbidities  that affects occupational performance. Patient will benefit from skilled OT to address above impairments and improve overall function.  MODIFICATION OR ASSISTANCE TO COMPLETE EVALUATION: No modification of tasks or assist necessary to complete an evaluation.  OT OCCUPATIONAL PROFILE AND HISTORY: Detailed assessment: Review of records and additional review of physical, cognitive, psychosocial history related to current functional performance.  CLINICAL DECISION MAKING: LOW - limited treatment options, no task modification necessary  REHAB POTENTIAL: Good  EVALUATION COMPLEXITY: Moderate    PLAN:  OT FREQUENCY: 2x/week  OT DURATION: 8 weeks  PLANNED INTERVENTIONS: self care/ADL training, therapeutic exercise, therapeutic activity, neuromuscular re-education, passive range of motion, functional mobility training, ultrasound, compression bandaging, moist heat, cryotherapy, patient/family education, cognitive remediation/compensation, psychosocial skills training, energy conservation, coping strategies training, and DME and/or AE instructions  RECOMMENDED OTHER SERVICES: NA  CONSULTED AND AGREED WITH PLAN OF CARE: Patient  PLAN FOR NEXT SESSION: Initiate large amplitude UB, initiate coordination HEP, begin to problem solve compensations for medication (memory, coordination)   Francetta Ilg, OTR/L 05/02/2023, 9:31 AM

## 2023-05-06 ENCOUNTER — Encounter: Payer: Self-pay | Admitting: Physical Therapy

## 2023-05-06 ENCOUNTER — Ambulatory Visit: Payer: Medicare Other | Admitting: Physical Therapy

## 2023-05-06 DIAGNOSIS — M6281 Muscle weakness (generalized): Secondary | ICD-10-CM | POA: Diagnosis not present

## 2023-05-06 DIAGNOSIS — R2681 Unsteadiness on feet: Secondary | ICD-10-CM

## 2023-05-06 DIAGNOSIS — R29818 Other symptoms and signs involving the nervous system: Secondary | ICD-10-CM

## 2023-05-06 DIAGNOSIS — R293 Abnormal posture: Secondary | ICD-10-CM | POA: Diagnosis not present

## 2023-05-06 DIAGNOSIS — R2689 Other abnormalities of gait and mobility: Secondary | ICD-10-CM

## 2023-05-06 DIAGNOSIS — R278 Other lack of coordination: Secondary | ICD-10-CM | POA: Diagnosis not present

## 2023-05-08 ENCOUNTER — Ambulatory Visit: Payer: Medicare Other | Admitting: Physical Therapy

## 2023-05-09 ENCOUNTER — Ambulatory Visit: Payer: Medicare Other | Attending: Neurology | Admitting: Occupational Therapy

## 2023-05-09 ENCOUNTER — Encounter: Payer: Self-pay | Admitting: Psychiatry

## 2023-05-09 ENCOUNTER — Ambulatory Visit (INDEPENDENT_AMBULATORY_CARE_PROVIDER_SITE_OTHER): Payer: Medicare Other | Admitting: Psychiatry

## 2023-05-09 DIAGNOSIS — R278 Other lack of coordination: Secondary | ICD-10-CM | POA: Diagnosis not present

## 2023-05-09 DIAGNOSIS — R2681 Unsteadiness on feet: Secondary | ICD-10-CM | POA: Insufficient documentation

## 2023-05-09 DIAGNOSIS — F330 Major depressive disorder, recurrent, mild: Secondary | ICD-10-CM | POA: Diagnosis not present

## 2023-05-09 DIAGNOSIS — R29818 Other symptoms and signs involving the nervous system: Secondary | ICD-10-CM | POA: Insufficient documentation

## 2023-05-09 DIAGNOSIS — M6281 Muscle weakness (generalized): Secondary | ICD-10-CM | POA: Insufficient documentation

## 2023-05-09 DIAGNOSIS — M25611 Stiffness of right shoulder, not elsewhere classified: Secondary | ICD-10-CM | POA: Insufficient documentation

## 2023-05-09 DIAGNOSIS — F419 Anxiety disorder, unspecified: Secondary | ICD-10-CM

## 2023-05-09 DIAGNOSIS — R2689 Other abnormalities of gait and mobility: Secondary | ICD-10-CM | POA: Insufficient documentation

## 2023-05-09 NOTE — Patient Instructions (Signed)
PWR! Hands  With arms stretched out in front of you (elbows straight), perform the following: PWR! Up: Close hands and flick fingers open and apart BIG PWR! Rock: Move wrists up and down [employer]! Twist: Twist palms up and down BIG PWR! Step: Touch index finger to thumb while keeping other fingers straight. Flick fingers out BIG (thumb out/straighten fingers). Repeat with other fingers. (Step your thumb to each finger).  Then, start with elbows bent and hands closed. PWR! Up: Close hands and flick fingers open and apart BIG PWR! Rock: Move wrists up and down [employer]! Twist: Twist palms up and down BIG PWR! Step: Touch index finger to thumb while keeping other fingers straight. Flick fingers out BIG (thumb out/straighten fingers). Repeat with other fingers. (Step your thumb to each finger).   PWR! Hands: Push hands out BIG. Elbows straight, wrists up, fingers open and spread apart BIG. (Can also perform by pushing down on table, chair, knees. Push above head, out to the side, behind you, in front of you.)   ** Make each movement big and deliberate so that you feel the movement.  Perform at least 10 repetitions 1x/day, but perform PWR! hands throughout the day when you are having trouble using your hands (picking up/manipulating small objects, writing, eating, typing, sewing, buttoning, etc.).

## 2023-05-09 NOTE — Therapy (Signed)
OUTPATIENT OCCUPATIONAL THERAPY [facility]  Treatment Note  Patient Name: SEINI VOLLMAN MRN: [id number] DOB:1945/12/31, 77 y.o., female Today's Date: 05/09/2023  PCP: Catha Gosselin, MD REFERRING PROVIDER: Vladimir Faster, DO  END OF SESSION:  OT End of Session - 05/09/23 1030     Visit Number 2    Number of Visits 17    Date for OT Re-Evaluation 06/28/23    Authorization Type Medicare A&B    OT Start Time 1018    OT Stop Time 1100    OT Time Calculation (min) 42 min              Past Medical History:  Diagnosis Date   Cancer (HCC)    KIDNEY   Discoid lupus    Endometriosis    Headache(784.0)    Insomnia    Kidney disease    Lupus (HCC)    Osteopenia    Urinary incontinence    Past Surgical History:  Procedure Laterality Date   CATARACT EXTRACTION, BILATERAL     PELVIC LAPAROSCOPY     DIAG LASER LAP-ENDOMETRIOSIS   REMOVAL OF LEFT KIDNEY  10/08/2006   Patient Active Problem List   Diagnosis Date Noted   Major depressive disorder, single episode, moderate (HCC) 08/06/2018   Moderate major depression (HCC) 06/23/2018   Anxiety disorder 06/23/2018   Pruritus 04/01/2017   Onychomycosis 05/12/2012   Lentigines 05/12/2012   Discoid lupus    Cancer (HCC)    Headache(784.0)    Insomnia    Endometriosis    Osteopenia     ONSET DATE: referral 04/26/23 (recent PD diagnosis)  REFERRING DIAG: G20.A1 (ICD-10-CM) - Parkinson's disease without dyskinesia or fluctuating manifestations  THERAPY DIAG:  Other symptoms and signs involving the nervous system  Muscle weakness (generalized)  Other lack of coordination  Stiffness of right shoulder, not elsewhere classified  Rationale for Evaluation and Treatment: Rehabilitation  SUBJECTIVE:   SUBJECTIVE STATEMENT: Pt reports "a little bit on the sluggish side." Pt accompanied by: self  PERTINENT HISTORY: recent dx of PD, memory loss, depression  PRECAUTIONS: Fall, back pain/muscle spasms  WEIGHT  BEARING RESTRICTIONS: No  PAIN:  Are you having pain? No  FALLS: Has patient fallen in last 6 months? No  LIVING ENVIRONMENT: Lives with: lives with their spouse Lives in: House/apartment Stairs: Yes: External: 2-3 steps; on left going up Has following equipment at home: Grab bars  PLOF: Independent  Enjoys volunteering at [employer]; enjoys travelling.  PATIENT GOALS: control the arm/hand tremors with hand use  OBJECTIVE:   HAND DOMINANCE: Right  ADLs: Overall ADLs: Increased time, difficulty with clothing fasteners Transfers/ambulation related to ADLs: Independent Eating: increased spilling and dropping of foods UB Dressing: difficulty with buttons and other clothing fasteners LB Dressing: increased time and difficulty with clothing fasteners Toileting: occasional need to pull self up Bathing: Independent Tub Shower transfers: Mod I, holding on to door and grab bar when stepping in/out of shower  IADLs: Light housekeeping: increased tremors impacting washing dishes, folding laundry Meal Prep: completes with increased time/effort Community mobility: driving Medication management: difficulty with coordination, dropping pills occasionally.  Does keep a log to ensure recall of when taking medications Handwriting: 100% legible and PPT #1 (whales live in a blue ocean): 14.72 sec, reports handwriting used to be more rounded and occasionally "arm jerks" when writing.  MOBILITY STATUS: Independent  POSTURE COMMENTS:  rounded shoulders and forward head  ACTIVITY TOLERANCE: Activity tolerance: WFL for tasks assessed on eval  FUNCTIONAL OUTCOME  MEASURES: Standing functional reach: Right: 3 inches; Left: 3 inches Fastening/unfastening 3 buttons: 37.40 sec Physical performance test: PPT#2 (simulated eating) 14.12 sec & PPT#4 (donning/doffing jacket): 13.25 sec  COORDINATION: 9 Hole Peg test: Right: 24.0 sec; Left: 26.62 sec Box and Blocks:  Right 52 blocks, Left 50  blocks Tremors: Resting, action, and Right  UE ROM:   decreased shoulder flexion bilaterally, however WFL;  hold RUE in shoulder internal rotation, decreased internal rotation bilaterally, limited to reaching to top of hips not able to reach mid back.  SENSATION: Reports intermittent light tingling in R hand  MUSCLE TONE: RUE: Mild and Hypertonic  COGNITION: Overall cognitive status: Within functional limits for tasks assessed   TODAY'S TREATMENT:                                                                      DATE:  05/09/23 Large amplitude UB: OT instructed pt in large amplitude hands opening/closing, wrist flexion/extension, forearm supination/pronation, thumb opposition with elbows bent and extended.  OT providing demonstration and min cues for amplitude, noting decreased wrist flexion/extension.  Forearm extension with elbows straight, wrists extended, and fingers open.  OT providing cues for seated posture for increased anterior weight shift during forearm extension.  Transitioned to modified LSVT big seated exercise with focus on reaching forward, towards floor, up towards ceiling, BUE out to side, and back to middle. OT providing box on floor to allow for improved reach towards floor with increased amplitude. UE ROM: engaged in prayer stretch for increased wrist extension.  OT encouraged pt to complete 2-3x/day as well as to complete prior to typing/handwriting to provide proprioceptive input as well. Coordination: engaged in card flipping with focus on carryover of large amplitude forearm rotation and big hand opening to pick up and release cards.  OT educated on functional carryover with forearm rotation and hand opening into other household tasks and encouraged pt to observe movements during household tasks.   05/02/23 Eval only    PATIENT EDUCATION: Education details: ongoing condition specific education Person educated: Patient Education method: Explanation Education  comprehension: verbalized understanding and needs further education  HOME EXERCISE PROGRAM: Large amplitude exercises - see pt instructions  Access Code: [email or web address] URL: [email or web address] Date: 05/09/2023 Prepared by: [facility] - Outpatient  Rehab - [facility]  Exercises - Seated Wrist Prayer Stretch  - 2-3 x daily - 5 reps - 10-15 sec hold  GOALS: Goals reviewed with patient? Yes  SHORT TERM GOALS: Target date: 05/31/23  Pt will be independent with PD specific HEP. Baseline: Goal status: IN PROGRESS  2.  Pt will verbalize understanding of task modifications/adaptive strategies and/or potential AE needs to increase ease, safety, and independence w/ ADLs/IADLs (as needed for medication management) Baseline:  Goal status: IN PROGRESS  3.  Pt will verbalize understanding of ways to prevent future PD related complications and PD community resources. Baseline:  Goal status: IN PROGRESS   LONG TERM GOALS: Target date: 06/28/23  Pt will write a short paragraph with 100% legibility and no significant decrease in letter size. Baseline:  Goal status: IN PROGRESS  2.  Pt will demonstrate ability to retrieve a lightweight object from moderate height self with increased trunk flexion, shoulder flexion, and elbow extension.  Baseline: Standing functional reach: Right: 3 inches; Left: 3 inches Goal status: IN PROGRESS  3.  Pt will demonstrate understanding of memory compensations and ways to keep thinking skills sharp Baseline:  Goal status: IN PROGRESS  4.  Pt will demonstrate improved UE functional use for ADLs as evidenced by increasing box/ blocks score by 4 blocks with RUE/LUE Baseline: R: 52, L: 50 Goal status: IN PROGRESS  5.  Pt will demonstrate improved ease with fastening buttons as evidenced by decreasing 3 button/ unbutton time to <30 seconds Baseline: 37.4 sec Goal status: IN PROGRESS   ASSESSMENT:  CLINICAL IMPRESSION: Pt demonstrating  decreased wrist extension and reports of mild pain in R upper arm with supination with elbows extended.  Pt tolerating each movement and demonstrating understanding with use of massed practice.  PERFORMANCE DEFICITS: in functional skills including ADLs, IADLs, coordination, tone, ROM, strength, flexibility, Fine motor control, Gross motor control, balance, body mechanics, endurance, decreased knowledge of precautions, decreased knowledge of use of DME, and UE functional use, cognitive skills including memory, problem solving, and safety awareness, and psychosocial skills including coping strategies and environmental adaptation.   IMPAIRMENTS: are limiting patient from ADLs and IADLs.      PLAN:  OT FREQUENCY: 2x/week  OT DURATION: 8 weeks  PLANNED INTERVENTIONS: self care/ADL training, therapeutic exercise, therapeutic activity, neuromuscular re-education, passive range of motion, functional mobility training, ultrasound, compression bandaging, moist heat, cryotherapy, patient/family education, cognitive remediation/compensation, psychosocial skills training, energy conservation, coping strategies training, and DME and/or AE instructions  RECOMMENDED OTHER SERVICES: NA  CONSULTED AND AGREED WITH PLAN OF CARE: Patient  PLAN FOR NEXT SESSION: Initiate coordination HEP, review wrist extension and add flexion if needed, edu on grip/release pattern for motor control, begin to problem solve compensations for medication (memory, coordination)   Yesika Rispoli, OTR/L 05/09/2023, 10:30 AM

## 2023-05-09 NOTE — Progress Notes (Signed)
Andrea Burke [id number] December 18, 1945 77 y.o.  Subjective:   Patient ID:  Andrea Burke is a 77 y.o. (DOB 03/05/1946) female.  Chief Complaint:  Chief Complaint  Patient presents with   Follow-up    Depression, Anxiety    HPI Andrea Burke presents to the office today for follow-up of depression, anxiety, and insomnia.   She saw Dr. Arbutus Leas for evaluation and was diagnosed with Parkinson's. She has started treatment with Sinemet and started PT today. She reports that she has "fewer of the really jerky motions" and her mouth is not quivering as much. She reports, "I still have some dreadful thoughts about it, but working on it. That's all I can do." She reports that her mood has been "ok." She reports sad mood on a few occasions in response to negative thoughts. She reports that she is falling asleep without difficulty and "I could sleep for hours, actually." She reports that she has sometimes taken up to 3 naps a day and still slept well at night. She reports excessive daytime somnolence. She reports sleeping 7-8 hours a night with some interruptions. She reports, "most days, sluggish." She reports that her motivation, "comes in spurts." Appetite has been "not too good." She reports, "I want to get things done, but... " has some difficulty with concentration. Denies SI.   She has moved Cymbalta to around 6 pm.  She continues to volunteer.   Past medication trials: Remeron- vivid dreams, increased appetite Lexapro-nightmares Sertraline-agitation, insomnia, impaired concentration Paxil-intolerable side effects Fluoxetine-intolerable side effects Viibryd- Partial improvement on 20 mg. Does not want to take doses above 20 mg. Cymbalta Wellbutrin- Severe tremors Lithium Hydroxyzine- has taken for itching.  Xanax Deplin Gabapentin- prescribed for pain  Mini-Mental    Flowsheet Row Office Visit from 11/18/2020 in [facility] Office Visit from  04/25/2020 in [facility]  Total Score (max 30 points ) 29 29      Flowsheet Row ED from 02/26/2023 in [facility] at [facility]  C-SSRS RISK CATEGORY No Risk        Review of Systems:  Review of Systems  Musculoskeletal:  Negative for gait problem.  Neurological:        She reports some improvement in tremor since starting Carbidopa/Levodopa  Psychiatric/Behavioral:         Please refer to HPI    Medications: I have reviewed the patient's current medications.  Current Outpatient Medications  Medication Sig Dispense Refill   carbidopa-levodopa (SINEMET IR) 25-100 MG tablet Take 1 tablet by mouth 3 (three) times daily. 10am/2pm/6pm 270 tablet 1   Multiple Vitamins-Minerals (CENTRUM SILVER 50+WOMEN) TABS Take Daily     Calcium Carbonate-Vitamin D (CALTRATE 600+D PO) Take by mouth.     clobetasol cream (TEMOVATE) 0.05 % Apply topically.     Clobetasol Propionate (TEMOVATE) 0.05 % external spray Apply to scalp twice daily x 2 wk, then daily x 2 wk, then every other day x 2 wk. Not to face.     DULoxetine (CYMBALTA) 30 MG capsule TAKE 1 CAPSULE DAILY 90 capsule 1   finasteride (PROSCAR) 5 MG tablet Take 5 mg by mouth daily.     fish oil-omega-3 fatty acids 1000 MG capsule Take 2 g by mouth daily.     hydroxychloroquine (PLAQUENIL) 200 MG tablet Take by mouth daily.     No current facility-administered medications for this visit.    Medication Side Effects: Sedation  Allergies:  Allergies  Allergen Reactions   Doxycycline Calcium    Penicillins     Past Medical History:  Diagnosis Date   Cancer (HCC)    KIDNEY   Discoid lupus    Endometriosis    Headache(784.0)    Insomnia    Kidney disease    Lupus (HCC)    Osteopenia    Urinary incontinence     Past Medical History, Surgical history, Social history, and Family history were reviewed and updated as appropriate.   Please see review of systems for further  details on the patient's review from today.   Objective:   Physical Exam:  There were no vitals taken for this visit.  Physical Exam Constitutional:      General: She is not in acute distress. Musculoskeletal:        General: No deformity.  Neurological:     Mental Status: She is alert and oriented to person, place, and time.     Coordination: Coordination normal.  Psychiatric:        Attention and Perception: Attention and perception normal. She does not perceive auditory or visual hallucinations.        Mood and Affect: Affect is not labile, blunt, angry or inappropriate.        Speech: Speech normal.        Behavior: Behavior normal.        Thought Content: Thought content normal. Thought content is not paranoid or delusional. Thought content does not include homicidal or suicidal ideation. Thought content does not include homicidal or suicidal plan.        Cognition and Memory: Cognition and memory normal.        Judgment: Judgment normal.     Comments: Insight intact Affect is brighter compared to recent exams. Mood presents as less anxious and less depressed     Lab Review:     Component Value Date/Time   NA 132 (L) 02/26/2023 0515   K 4.0 02/26/2023 0515   CL 103 02/26/2023 0515   CO2 22 02/26/2023 0515   GLUCOSE 127 (H) 02/26/2023 0515   BUN 14 02/26/2023 0515   CREATININE 0.81 02/26/2023 0515   CREATININE 0.72 07/31/2014 1222   CALCIUM 9.1 02/26/2023 0515   PROT 7.5 07/31/2014 1222   ALBUMIN 4.3 07/31/2014 1222   AST 21 07/31/2014 1222   ALT 17 07/31/2014 1222   ALKPHOS 54 07/31/2014 1222   BILITOT 0.6 07/31/2014 1222   GFRNONAA >60 02/26/2023 0515       Component Value Date/Time   WBC 3.6 (L) 02/26/2023 0515   RBC 4.66 02/26/2023 0515   HGB 13.2 02/26/2023 0515   HCT 39.9 02/26/2023 0515   PLT 169 02/26/2023 0515   MCV 85.6 02/26/2023 0515   MCV 86.6 07/31/2014 1222   MCH 28.3 02/26/2023 0515   MCHC 33.1 02/26/2023 0515   RDW 12.7 02/26/2023 0515    LYMPHSABS 1,887 09/29/2018 0951   EOSABS 122 09/29/2018 0951   BASOSABS 30 09/29/2018 0951    No results found for: "POCLITH", "LITHIUM"   No results found for: "PHENYTOIN", "PHENOBARB", "VALPROATE", "CBMZ"   .res Assessment: Plan:    48 minutes spent dedicated to the care of this patient on the date of this encounter to include pre-visit review of records, ordering of medication, post visit documentation, and face-to-face time with the patient discussing new diagnosis of Parkinsons and answering questions related to diagnosis and treatment that have been causing her anxiety and recommending that she contact  neurologist's office about excessive daytime somnolence that she has been experiencing since starting Carbidopa-levodopa. Recommended moving administration time of Duloxetine back to in the morning. Discussed that Duloxetine may help increase energy during the daytime and taking it later in the day could be contributing to sleep disturbance.  Encouraged pt to consider connecting with other people that have Parkinson's disease for support and to share their experiences. She reports that she has some information about support groups and community resources that were printed off during apt with neurologist.  Recommend continuing therapy with Rockne Menghini, LCSW.  Pt to follow-up with this provider in 4 weeks or sooner if clinically indicated.  Patient advised to contact office with any questions, adverse effects, or acute worsening in signs and symptoms.   Ethelreda was seen today for follow-up.  Diagnoses and all orders for this visit:  Anxiety disorder, unspecified type  Mild episode of recurrent major depressive disorder (HCC)     Please see After Visit Summary for patient specific instructions.  Future Appointments  Date Time Provider Department Center  05/13/2023  2:00 PM Gean Maidens, [place] [facility]  05/13/2023  2:45 PM Kathline Magic Brand Males, OT [facility]  05/15/2023  1:15 PM  Anette Guarneri D, PT [facility]  05/22/2023  8:45 AM Gean Maidens, PT [facility]  05/22/2023  9:30 AM Dillard Essex, OT [facility]  05/22/2023  4:00 PM Mathis Fare, LCSW [facility] None  05/24/2023 11:00 AM Gean Maidens, PT [facility]  06/06/2023  4:00 PM Corie Chiquito, PMHNP [facility] None  07/25/2023  3:30 PM Tat, Octaviano Batty, DO [facility] None    No orders of the defined types were placed in this encounter.   -------------------------------

## 2023-05-13 ENCOUNTER — Encounter: Payer: Self-pay | Admitting: Physical Therapy

## 2023-05-13 ENCOUNTER — Ambulatory Visit: Payer: Medicare Other | Admitting: Occupational Therapy

## 2023-05-13 ENCOUNTER — Ambulatory Visit: Payer: Medicare Other | Admitting: Physical Therapy

## 2023-05-13 DIAGNOSIS — R29818 Other symptoms and signs involving the nervous system: Secondary | ICD-10-CM | POA: Diagnosis not present

## 2023-05-13 DIAGNOSIS — M6281 Muscle weakness (generalized): Secondary | ICD-10-CM | POA: Diagnosis not present

## 2023-05-13 DIAGNOSIS — R278 Other lack of coordination: Secondary | ICD-10-CM

## 2023-05-13 DIAGNOSIS — M25611 Stiffness of right shoulder, not elsewhere classified: Secondary | ICD-10-CM | POA: Diagnosis not present

## 2023-05-13 DIAGNOSIS — R2689 Other abnormalities of gait and mobility: Secondary | ICD-10-CM

## 2023-05-13 DIAGNOSIS — R2681 Unsteadiness on feet: Secondary | ICD-10-CM | POA: Diagnosis not present

## 2023-05-13 NOTE — Therapy (Signed)
OUTPATIENT PHYSICAL THERAPY NEURO TREATMENT   Patient Name: Andrea Burke MRN: [id number] DOB:02/04/1946, 77 y.o., female Today's Date: 05/13/2023   PCP: Catha Gosselin, MD REFERRING PROVIDER: Vladimir Faster, DO   END OF SESSION:  PT End of Session - 05/13/23 1405     Visit Number 6    Number of Visits 16    Date for PT Re-Evaluation 06/14/23    Authorization Type Medicare/BCBS    Progress Note Due on Visit 10    PT Start Time 1405    PT Stop Time 1445    PT Time Calculation (min) 40 min    Equipment Utilized During Treatment Gait belt    Activity Tolerance Patient tolerated treatment well    Behavior During Therapy WFL for tasks assessed/performed                  Past Medical History:  Diagnosis Date   Cancer (HCC)    KIDNEY   Discoid lupus    Endometriosis    Headache(784.0)    Insomnia    Kidney disease    Lupus (HCC)    Osteopenia    Urinary incontinence    Past Surgical History:  Procedure Laterality Date   CATARACT EXTRACTION, BILATERAL     PELVIC LAPAROSCOPY     DIAG LASER LAP-ENDOMETRIOSIS   REMOVAL OF LEFT KIDNEY  10/08/2006   Patient Active Problem List   Diagnosis Date Noted   Major depressive disorder, single episode, moderate (HCC) 08/06/2018   Moderate major depression (HCC) 06/23/2018   Anxiety disorder 06/23/2018   Pruritus 04/01/2017   Onychomycosis 05/12/2012   Lentigines 05/12/2012   Discoid lupus    Cancer (HCC)    Headache(784.0)    Insomnia    Endometriosis    Osteopenia     ONSET DATE: 04/17/2023 (MD referral)  REFERRING DIAG: G20.A1 (ICD-10-CM) - Parkinson's disease without dyskinesia or fluctuating manifestations   THERAPY DIAG:  Unsteadiness on feet  Other abnormalities of gait and mobility  Muscle weakness (generalized)  Other symptoms and signs involving the nervous system  Rationale for Evaluation and Treatment: Rehabilitation  SUBJECTIVE:                                                                                                                                                                                              SUBJECTIVE STATEMENT: Getting used to the medications; feel the tremors are slightly less.   Pt accompanied by: self and significant other  PERTINENT HISTORY: recent dx of PD, memory loss, depression  PAIN:  Are you having pain? No A little soreness in my back  from excess yardwork  PRECAUTIONS: Fall  RED FLAGS: None   WEIGHT BEARING RESTRICTIONS: No  FALLS: Has patient fallen in last 6 months? No  LIVING ENVIRONMENT: Lives with: lives with their spouse Lives in: House/apartment Stairs: Yes: External: 2 steps; on left going up Has following equipment at home: None  PLOF: Independent  Enjoys volunteering at [employer]; enjoys travelling.  PATIENT GOALS: Want to do my activities without falling and continue to travel  OBJECTIVE:    TODAY'S TREATMENT: 05/13/2023 Activity Comments  Nustep L5 x 8 min 4 extremities Cues to keep SPM >70  Sit to stand with PWR! Up x 10 reps   Standing PWR! Rock x 10 reps Standing PWR! Twist x 10 reps Standing PWR! Step x 10 reps Good form, cues for big, deliberate pace; cues for wider BOS upon return to midline with step  Forward/back walking 20-25 ft  Cues for UE/reciprocal arm coordination (does best with cues to focus on large step length and push of arms into posterior direction)  Standing on Airex:  side step and weightshift x 10 reps; forward step and weightshift x 10, forward>back step and weightshift x 10 reps 1 UE support; cues for foot clearance  5x sit<>stand 17.41 sec Arms crossed at chest      HOME EXERCISE PROGRAM Last updated: 04/25/23 Access Code: [email or web address] URL: [email or web address] Date: 04/25/2023 Prepared by: [facility] - Outpatient  Rehab - [facility]  Exercises - Sit to Stand Without Arm Support  - 1 x daily - 5 x weekly - 2 sets - 10 reps  ALSO: seated PWR! Up with  handouts-perform 1-2x/daily, 5-10 reps ALSO: standing PWR! Up with handouts-perform 1-2x/daily, 5-10 reps    Below measures were taken at time of initial evaluation unless otherwise specified:   DIAGNOSTIC FINDINGS: NA for this episode  COGNITION: Overall cognitive status: Within functional limits for tasks assessed and reports fear and embarrassment in regards to tremors   SENSATION: WFL  MUSCLE TONE: RLE: Mild   POSTURE: rounded shoulders, forward head, and holds RUE in shoulder internal rotation, elbow flexion, fisted hand (able to extend and open up with cues)  LOWER EXTREMITY ROM:   WFL BLEs in sitting   LOWER EXTREMITY MMT:    MMT Right Eval Left Eval  Hip flexion 4 4  Hip extension    Hip abduction    Hip adduction    Hip internal rotation    Hip external rotation    Knee flexion 4 4  Knee extension 4+ 4  Ankle dorsiflexion 4+ 4  Ankle plantarflexion    Ankle inversion    Ankle eversion    (Blank rows = not tested)   TRANSFERS: Assistive device utilized: None  Sit to stand: SBA Stand to sit: SBA STAIRS: Level of Assistance: Modified independence Stair Negotiation Technique: Alternating Pattern  with Single Rail on Right Number of Stairs: 2-3  Height of Stairs: 4-6"    GAIT: Gait pattern:  holds RUE with LUE to prevent increased tremors, step through pattern, decreased arm swing- Left, decreased step length- Right, decreased trunk rotation, and narrow BOS Distance walked: 50 ft x 4 reps Assistive device utilized: None Level of assistance: SBA   FUNCTIONAL TESTS:  5 times sit to stand: 20.78 sec with arms crossed at ches Timed up and go (TUG): 12.31 sec Dynamic Gait Index: 14/24 TUG cognitive:11.32 sec (slowed counting) 15M walk: 10.12 sec (3.24 ft/sec)      TODAY'S TREATMENT:  DATE: 04/22/2023    PATIENT  EDUCATION: Education details: Eval results, POC, rationale for OT to address RUE tremor and bradykinesia; seated PWR! UP initiated for HEP Person educated: Patient and Spouse Education method: Explanation, Demonstration, and Handouts Education comprehension: verbalized understanding, returned demonstration, and needs further education  HOME EXERCISE PROGRAM: Initiated seated PWR! Up with handouts-perform 1-2x/daily, 5-10 reps  GOALS: Goals reviewed with patient? Yes  SHORT TERM GOALS: Target date: 05/17/2023  Pt will be independent with HEP for improved balance, strength, gait. Baseline: Goal status: MET 05/06/23  2.  Pt will improve 5x sit<>stand to less than or equal to 15 sec to demonstrate improved functional strength and transfer efficiency. Baseline: 20.78 sec Goal status: IN PROGRESS  3.  Pt will verbalize understanding of local Parkinson's disease community resources.  Baseline: newly dx with PD, July 2024; provided 05/02/23 Goal status: MET 05/02/23   LONG TERM GOALS: Target date: 06/14/2023  Pt will be independent with HEP for improved balance, strength, gait. Baseline:  Goal status: IN PROGRESS  2.  Pt will improve 5x sit<>stand to less than or equal to 12.5 sec to demonstrate improved functional strength and transfer efficiency. Baseline: 20.78 sec Goal status: IN PROGRESS  3.  Pt will improve DGI score to at least 19/24 to decrease fall risk. Baseline: 14/24 Goal status: IN PROGRESS  4.  Pt will verbalize plans for continued community fitness upon d/c from PT to maximize gains made in PT. Baseline:  Goal status:IN PROGRESS  ASSESSMENT:  CLINICAL IMPRESSION: Skilled PT session continued to focus on large amplitude, whole body movement patterns for posture, balance, weightshifting, and stepping.  She responds well to cues for improved foot clearance and step length on Airex.  She continues to have difficulty with reciprocal arm swing with gait and does best with  this with cues for focus on large step length and focus on posterior swing of arms to relax them to swing forward.  She will continue to benefit from skilled PT towards goals for improved functional mobility.  OBJECTIVE IMPAIRMENTS: Abnormal gait, decreased balance, decreased mobility, difficulty walking, decreased strength, impaired tone, and postural dysfunction.   ACTIVITY LIMITATIONS: carrying, bending, standing, transfers, reach over head, and locomotion level  PARTICIPATION LIMITATIONS: meal prep, cleaning, laundry, driving, shopping, and community activity  PERSONAL FACTORS: 3+ comorbidities: see above; pt newly diagnosed with PD < 1 week ago  are also affecting patient's functional outcome.   REHAB POTENTIAL: Good  CLINICAL DECISION MAKING: Evolving/moderate complexity  EVALUATION COMPLEXITY: Moderate  PLAN:  PT FREQUENCY: 2x/week  PT DURATION: 8 weeks, including eval week  PLANNED INTERVENTIONS: Therapeutic exercises, Therapeutic activity, Neuromuscular re-education, Balance training, Gait training, Patient/Family education, Self Care, and DME instructions  PLAN FOR NEXT SESSION: Continue towards LTGs.  work on standing PWR! Moves, gait and balance activities to incorporate RUE arm swing, sit<>Stand.  Provide patient with most updated POP resource information sheet    Lonia Blood, PT 05/13/23 5:11 PM Phone: [phone] Fax: [phone]   [facility] at [facility] [address] [place], [place] [address] Phone # [phone] Fax # [phone]

## 2023-05-13 NOTE — Patient Instructions (Signed)
Coordination Exercises  Perform the following exercises for 10-15 minutes 1 time(s) per day. Perform with right hand(s). Perform using big movements.  Flipping Cards: Place deck of cards on the table. Flip cards over by opening your hand big to grasp and then turn your palm up big. Deal cards: Hold 1/2 or whole deck in your hand. Use thumb to push card off top of deck with one big push. Flip card between each finger.  Pick up coins and place in coin bank or container: Pick up with big, intentional movements. Do not drag coin to the edge. Pick up coins and stack/unstack one at a time: Pick up with big, intentional movements. Do not drag coin to the edge. (5-10 in a stack) Pick up 5-10 coins one at a time and hold in palm. Then, move coins from palm to fingertips one at time and place in coin bank/container. Pick up various small objects that are different shapes/sizes and place in container. (paperclips, buttons, keys, dried beans/pasta, coins, screws, nuts/bolts, washers, board game pieces, etc.) Fasten nuts/bolts or put on bottle caps: Turn as much/as big as you can with each turn.

## 2023-05-13 NOTE — Therapy (Signed)
OUTPATIENT OCCUPATIONAL THERAPY [facility]  Treatment Note  Patient Name: Andrea Burke MRN: [id number] DOB:1946-05-15, 77 y.o., female Today's Date: 05/13/2023  PCP: Catha Gosselin, MD REFERRING PROVIDER: Vladimir Faster, DO  END OF SESSION:  OT End of Session - 05/13/23 1452     Visit Number 3    Number of Visits 17    Date for OT Re-Evaluation 06/28/23    Authorization Type Medicare A&B    OT Start Time 1449    OT Stop Time 1531    OT Time Calculation (min) 42 min               Past Medical History:  Diagnosis Date   Cancer (HCC)    KIDNEY   Discoid lupus    Endometriosis    Headache(784.0)    Insomnia    Kidney disease    Lupus (HCC)    Osteopenia    Urinary incontinence    Past Surgical History:  Procedure Laterality Date   CATARACT EXTRACTION, BILATERAL     PELVIC LAPAROSCOPY     DIAG LASER LAP-ENDOMETRIOSIS   REMOVAL OF LEFT KIDNEY  10/08/2006   Patient Active Problem List   Diagnosis Date Noted   Major depressive disorder, single episode, moderate (HCC) 08/06/2018   Moderate major depression (HCC) 06/23/2018   Anxiety disorder 06/23/2018   Pruritus 04/01/2017   Onychomycosis 05/12/2012   Lentigines 05/12/2012   Discoid lupus    Cancer (HCC)    Headache(784.0)    Insomnia    Endometriosis    Osteopenia     ONSET DATE: referral 04/26/23 (recent PD diagnosis)  REFERRING DIAG: G20.A1 (ICD-10-CM) - Parkinson's disease without dyskinesia or fluctuating manifestations  THERAPY DIAG:  Other symptoms and signs involving the nervous system  Other lack of coordination  Stiffness of right shoulder, not elsewhere classified  Muscle weakness (generalized)  Rationale for Evaluation and Treatment: Rehabilitation  SUBJECTIVE:   SUBJECTIVE STATEMENT: Pt reports "she (PT) warmed me up." Pt accompanied by: self  PERTINENT HISTORY: recent dx of PD, memory loss, depression  PRECAUTIONS: Fall, back pain/muscle spasms  WEIGHT BEARING  RESTRICTIONS: No  PAIN:  Are you having pain? Yes, reports "little bit" of tightness in back, unrated.  FALLS: Has patient fallen in last 6 months? No  LIVING ENVIRONMENT: Lives with: lives with their spouse Lives in: House/apartment Stairs: Yes: External: 2-3 steps; on left going up Has following equipment at home: Grab bars  PLOF: Independent  Enjoys volunteering at [employer]; enjoys travelling.  PATIENT GOALS: control the arm/hand tremors with hand use  OBJECTIVE:   HAND DOMINANCE: Right  ADLs: Overall ADLs: Increased time, difficulty with clothing fasteners Transfers/ambulation related to ADLs: Independent Eating: increased spilling and dropping of foods UB Dressing: difficulty with buttons and other clothing fasteners LB Dressing: increased time and difficulty with clothing fasteners Toileting: occasional need to pull self up Bathing: Independent Tub Shower transfers: Mod I, holding on to door and grab bar when stepping in/out of shower  IADLs: Light housekeeping: increased tremors impacting washing dishes, folding laundry Meal Prep: completes with increased time/effort Community mobility: driving Medication management: difficulty with coordination, dropping pills occasionally.  Does keep a log to ensure recall of when taking medications Handwriting: 100% legible and PPT #1 (whales live in [place]): 14.72 sec, reports handwriting used to be more rounded and occasionally "arm jerks" when writing.  MOBILITY STATUS: Independent  POSTURE COMMENTS:  rounded shoulders and forward head  ACTIVITY TOLERANCE: Activity tolerance: WFL for  tasks assessed on eval  FUNCTIONAL OUTCOME MEASURES: Standing functional reach: Right: 3 inches; Left: 3 inches Fastening/unfastening 3 buttons: 37.40 sec Physical performance test: PPT#2 (simulated eating) 14.12 sec & PPT#4 (donning/doffing jacket): 13.25 sec  COORDINATION: 9 Hole Peg test: Right: 24.0 sec; Left: 26.62 sec Box  and Blocks:  Right 52 blocks, Left 50 blocks Tremors: Resting, action, and Right  UE ROM:   decreased shoulder flexion bilaterally, however WFL;  hold RUE in shoulder internal rotation, decreased internal rotation bilaterally, limited to reaching to top of hips not able to reach mid back.  SENSATION: Reports intermittent light tingling in R hand  MUSCLE TONE: RUE: Mild and Hypertonic  COGNITION: Overall cognitive status: Within functional limits for tasks assessed   TODAY'S TREATMENT:                                                                      DATE:  05/13/23 Reviewed large amplitude HEP as pt with questions about PWR! Hands.  Pt demonstrating good amplitude with all exercises with elbows bent and straightened.  Pt demonstrating good carryover for PWR! Hands with full body anterior weight shift with elbows and wrists extended and hands open. Large amplitude: engaged in large amplitude movements while standing incorporating purposeful opening of hand, supination, and trunk rotation.  Incorporated weight shifting and reaching outside BOS and across midline with RUE while picking up and placing rings from lower surface to vertical target.   Coordination: w/ RUE including: rotating cards in-hand/turning cards end-over end, pushing cards off deck held in-hand using thumb only, picking up various small objects/coins and placing into container, picking up coins and stacking/unstacking them, and picking up 5-10 coins 1 at a time and translating palm to fingertips to place in coin slot.  OT encouraged pt to focus on purposeful, large amplitude movements during each activity.    05/09/23 Large amplitude UB: OT instructed pt in large amplitude hands opening/closing, wrist flexion/extension, forearm supination/pronation, thumb opposition with elbows bent and extended.  OT providing demonstration and min cues for amplitude, noting decreased wrist flexion/extension.  Forearm extension with elbows  straight, wrists extended, and fingers open.  OT providing cues for seated posture for increased anterior weight shift during forearm extension.  Transitioned to modified LSVT big seated exercise with focus on reaching forward, towards floor, up towards ceiling, BUE out to side, and back to middle. OT providing box on floor to allow for improved reach towards floor with increased amplitude. UE ROM: engaged in prayer stretch for increased wrist extension.  OT encouraged pt to complete 2-3x/day as well as to complete prior to typing/handwriting to provide proprioceptive input as well. Coordination: engaged in card flipping with focus on carryover of large amplitude forearm rotation and big hand opening to pick up and release cards.  OT educated on functional carryover with forearm rotation and hand opening into other household tasks and encouraged pt to observe movements during household tasks.   05/02/23 Eval only    PATIENT EDUCATION: Education details: ongoing condition specific education Person educated: Patient Education method: Explanation Education comprehension: verbalized understanding and needs further education  HOME EXERCISE PROGRAM: Large amplitude exercises - see pt instructions  Access Code: [email or web address] URL: [email or web address] Date: 05/09/2023 Prepared by: [facility] -  Outpatient  Rehab - [facility]  Exercises - Seated Wrist Prayer Stretch  - 2-3 x daily - 5 reps - 10-15 sec hold  GOALS: Goals reviewed with patient? Yes  SHORT TERM GOALS: Target date: 05/31/23  Pt will be independent with PD specific HEP. Baseline: Goal status: IN PROGRESS  2.  Pt will verbalize understanding of task modifications/adaptive strategies and/or potential AE needs to increase ease, safety, and independence w/ ADLs/IADLs (as needed for medication management) Baseline:  Goal status: IN PROGRESS  3.  Pt will verbalize understanding of ways to prevent future PD related  complications and PD community resources. Baseline:  Goal status: IN PROGRESS   LONG TERM GOALS: Target date: 06/28/23  Pt will write a short paragraph with 100% legibility and no significant decrease in letter size. Baseline:  Goal status: IN PROGRESS  2.  Pt will demonstrate ability to retrieve a lightweight object from moderate height self with increased trunk flexion, shoulder flexion, and elbow extension. Baseline: Standing functional reach: Right: 3 inches; Left: 3 inches Goal status: IN PROGRESS  3.  Pt will demonstrate understanding of memory compensations and ways to keep thinking skills sharp Baseline:  Goal status: IN PROGRESS  4.  Pt will demonstrate improved UE functional use for ADLs as evidenced by increasing box/ blocks score by 4 blocks with RUE/LUE Baseline: R: 52, L: 50 Goal status: IN PROGRESS  5.  Pt will demonstrate improved ease with fastening buttons as evidenced by decreasing 3 button/ unbutton time to <30 seconds Baseline: 37.4 sec Goal status: IN PROGRESS   ASSESSMENT:  CLINICAL IMPRESSION: Pt verbalizing and demonstrating good understanding of large amplitude/PWR moves with good carryover after review.  Pt demonstrating difficulty with sliding cards off deck with thumb and attempts at rotation of cards between finger tips.  Pt reporting understanding of functional carryover of tasks.  PERFORMANCE DEFICITS: in functional skills including ADLs, IADLs, coordination, tone, ROM, strength, flexibility, Fine motor control, Gross motor control, balance, body mechanics, endurance, decreased knowledge of precautions, decreased knowledge of use of DME, and UE functional use, cognitive skills including memory, problem solving, and safety awareness, and psychosocial skills including coping strategies and environmental adaptation.   IMPAIRMENTS: are limiting patient from ADLs and IADLs.      PLAN:  OT FREQUENCY: 2x/week  OT DURATION: 8 weeks  PLANNED  INTERVENTIONS: self care/ADL training, therapeutic exercise, therapeutic activity, neuromuscular re-education, passive range of motion, functional mobility training, ultrasound, compression bandaging, moist heat, cryotherapy, patient/family education, cognitive remediation/compensation, psychosocial skills training, energy conservation, coping strategies training, and DME and/or AE instructions  RECOMMENDED OTHER SERVICES: NA  CONSULTED AND AGREED WITH PLAN OF CARE: Patient  PLAN FOR NEXT SESSION: review coordination HEP, attempt ball toss/rotation, review wrist extension and add flexion if needed, edu on grip/release pattern for motor control, begin to problem solve compensations for medication (memory, coordination)   , , OTR/L 05/13/2023, 2:52 PM

## 2023-05-14 NOTE — Therapy (Signed)
OUTPATIENT PHYSICAL THERAPY NEURO TREATMENT   Patient Name: Andrea Burke MRN: [id number] DOB:[birth year], 77 y.o., female Today's Date: 05/15/2023   PCP: Catha Gosselin, MD REFERRING PROVIDER: Vladimir Faster, DO   END OF SESSION:  PT End of Session - 05/15/23 1401     Visit Number 7    Number of Visits 16    Date for PT Re-Evaluation 06/14/23    Authorization Type Medicare/BCBS    Progress Note Due on Visit 10    PT Start Time 1312    PT Stop Time 1400    PT Time Calculation (min) 48 min    Equipment Utilized During Treatment Gait belt    Activity Tolerance Patient tolerated treatment well    Behavior During Therapy WFL for tasks assessed/performed                   Past Medical History:  Diagnosis Date   Cancer (HCC)    KIDNEY   Discoid lupus    Endometriosis    Headache(784.0)    Insomnia    Kidney disease    Lupus (HCC)    Osteopenia    Urinary incontinence    Past Surgical History:  Procedure Laterality Date   CATARACT EXTRACTION, BILATERAL     PELVIC LAPAROSCOPY     DIAG LASER LAP-ENDOMETRIOSIS   REMOVAL OF LEFT KIDNEY  10/08/2006   Patient Active Problem List   Diagnosis Date Noted   Major depressive disorder, single episode, moderate (HCC) 08/06/2018   Moderate major depression (HCC) 06/23/2018   Anxiety disorder 06/23/2018   Pruritus 04/01/2017   Onychomycosis 05/12/2012   Lentigines 05/12/2012   Discoid lupus    Cancer (HCC)    Headache(784.0)    Insomnia    Endometriosis    Osteopenia     ONSET DATE: 04/17/2023 (MD referral)  REFERRING DIAG: G20.A1 (ICD-10-CM) - Parkinson's disease without dyskinesia or fluctuating manifestations   THERAPY DIAG:  Unsteadiness on feet  Other symptoms and signs involving the nervous system  Muscle weakness (generalized)  Other abnormalities of gait and mobility  Rationale for Evaluation and Treatment: Rehabilitation  SUBJECTIVE:                                                                                                                                                                                              SUBJECTIVE STATEMENT: Nothing new.   Pt accompanied by: self and significant other  PERTINENT HISTORY: recent dx of PD, memory loss, depression  PAIN:  Are you having pain? No   PRECAUTIONS: Fall  RED FLAGS: None   WEIGHT BEARING RESTRICTIONS: No  FALLS: Has patient fallen in last 6 months? No  LIVING ENVIRONMENT: Lives with: lives with their spouse Lives in: House/apartment Stairs: Yes: External: 2 steps; on left going up Has following equipment at home: None  PLOF: Independent  Enjoys volunteering at [employer]; enjoys travelling.  PATIENT GOALS: Want to do my activities without falling and continue to travel  OBJECTIVE:     TODAY'S TREATMENT: 05/15/23 Activity Comments  Nustep L5 x 7 min UEs/LEs  Maintaining ~88 SPM   standing on foam EO/EC wall bumps ; hip and shoulder strategy   Verbal cueing for sequencing; most difficulty with shoulder strategy d/t limited anterior wt shift  alt posterior step off of foam 2x10 each side; 2nd set with dual task keeping tennis ball balanced on flat surface  More limited balance with L foot leading step ; 1 LOB with min-mod A  sidestepping over hurdles Additional mental task: PT calling out direction of step  4 square step Most challenge with posterior step, requiring cues to shift wt fully onto stabilizing LE; CGA  resisted walking in pulley 15# 4x back/forward, 2x forward/back  Cueing to shift further anteriorly with forward stepping and maintain wider BOS     PATIENT EDUCATION: Education details: answering pt's questions about PD progression  Person educated: Patient Education method: Explanation Education comprehension: verbalized understanding    HOME EXERCISE PROGRAM Last updated: 04/25/23 Access Code: [email or web address] URL: [email or web address] Date: 04/25/2023 Prepared by: [facility] -  Outpatient  Rehab - [facility]  Exercises - Sit to Stand Without Arm Support  - 1 x daily - 5 x weekly - 2 sets - 10 reps  ALSO: seated PWR! Up with handouts-perform 1-2x/daily, 5-10 reps ALSO: standing PWR! Up with handouts-perform 1-2x/daily, 5-10 reps    Below measures were taken at time of initial evaluation unless otherwise specified:   DIAGNOSTIC FINDINGS: NA for this episode  COGNITION: Overall cognitive status: Within functional limits for tasks assessed and reports fear and embarrassment in regards to tremors   SENSATION: WFL  MUSCLE TONE: RLE: Mild   POSTURE: rounded shoulders, forward head, and holds RUE in shoulder internal rotation, elbow flexion, fisted hand (able to extend and open up with cues)  LOWER EXTREMITY ROM:   WFL BLEs in sitting   LOWER EXTREMITY MMT:    MMT Right Eval Left Eval  Hip flexion 4 4  Hip extension    Hip abduction    Hip adduction    Hip internal rotation    Hip external rotation    Knee flexion 4 4  Knee extension 4+ 4  Ankle dorsiflexion 4+ 4  Ankle plantarflexion    Ankle inversion    Ankle eversion    (Blank rows = not tested)   TRANSFERS: Assistive device utilized: None  Sit to stand: SBA Stand to sit: SBA STAIRS: Level of Assistance: Modified independence Stair Negotiation Technique: Alternating Pattern  with Single Rail on Right Number of Stairs: 2-3  Height of Stairs: 4-6"    GAIT: Gait pattern:  holds RUE with LUE to prevent increased tremors, step through pattern, decreased arm swing- Left, decreased step length- Right, decreased trunk rotation, and narrow BOS Distance walked: 50 ft x 4 reps Assistive device utilized: None Level of assistance: SBA   FUNCTIONAL TESTS:  5 times sit to stand: 20.78 sec with arms crossed at ches Timed up and go (TUG): 12.31 sec Dynamic Gait Index: 14/24 TUG cognitive:11.32 sec (slowed counting) 72M walk: 10.12 sec (3.24 ft/sec)  TODAY'S  TREATMENT:                                                                                                                              DATE: 04/22/2023    PATIENT EDUCATION: Education details: Eval results, POC, rationale for OT to address RUE tremor and bradykinesia; seated PWR! UP initiated for HEP Person educated: Patient and Spouse Education method: Explanation, Demonstration, and Handouts Education comprehension: verbalized understanding, returned demonstration, and needs further education  HOME EXERCISE PROGRAM: Initiated seated PWR! Up with handouts-perform 1-2x/daily, 5-10 reps  GOALS: Goals reviewed with patient? Yes  SHORT TERM GOALS: Target date: 05/17/2023  Pt will be independent with HEP for improved balance, strength, gait. Baseline: Goal status: MET 05/06/23  2.  Pt will improve 5x sit<>stand to less than or equal to 15 sec to demonstrate improved functional strength and transfer efficiency. Baseline: 20.78 sec Goal status: IN PROGRESS  3.  Pt will verbalize understanding of local Parkinson's disease community resources.  Baseline: newly dx with PD, July 2024; provided 05/02/23 Goal status: MET 05/02/23   LONG TERM GOALS: Target date: 06/14/2023  Pt will be independent with HEP for improved balance, strength, gait. Baseline:  Goal status: IN PROGRESS  2.  Pt will improve 5x sit<>stand to less than or equal to 12.5 sec to demonstrate improved functional strength and transfer efficiency. Baseline: 20.78 sec Goal status: IN PROGRESS  3.  Pt will improve DGI score to at least 19/24 to decrease fall risk. Baseline: 14/24 Goal status: IN PROGRESS  4.  Pt will verbalize plans for continued community fitness upon d/c from PT to maximize gains made in PT. Baseline:  Goal status:IN PROGRESS  ASSESSMENT:  CLINICAL IMPRESSION: Patient arrived to session without complaints. Worked on Teacher, music activities with patient tolerating EC activities well, most  limitation was evident in ability to transfer wt anteriorly. More instability was evident with stepping activities leading with L foot, requiring cues to step taller. Addition on dual task revealed increased tremor and instability. Patient performed well with stepping over obstacles today in different directions. Encouraged return to her gym routine as able and to report back on success/difficulties next week.   OBJECTIVE IMPAIRMENTS: Abnormal gait, decreased balance, decreased mobility, difficulty walking, decreased strength, impaired tone, and postural dysfunction.   ACTIVITY LIMITATIONS: carrying, bending, standing, transfers, reach over head, and locomotion level  PARTICIPATION LIMITATIONS: meal prep, cleaning, laundry, driving, shopping, and community activity  PERSONAL FACTORS: 3+ comorbidities: see above; pt newly diagnosed with PD < 1 week ago  are also affecting patient's functional outcome.   REHAB POTENTIAL: Good  CLINICAL DECISION MAKING: Evolving/moderate complexity  EVALUATION COMPLEXITY: Moderate  PLAN:  PT FREQUENCY: 2x/week  PT DURATION: 8 weeks, including eval week  PLANNED INTERVENTIONS: Therapeutic exercises, Therapeutic activity, Neuromuscular re-education, Balance training, Gait training, Patient/Family education, Self Care, and DME instructions  PLAN FOR NEXT SESSION: how did she do with going to [employer]? Continue  towards LTGs.  work on standing PWR! Moves, gait and balance activities to incorporate RUE arm swing, sit<>Stand.  Provide patient with most updated POP resource information sheet   Anette Guarneri, PT, DPT 05/15/23 2:05 PM  [facility] Rehab at [facility] [address] [place], [address] [place], [place] [address] Phone # [phone] Fax # [phone]

## 2023-05-15 ENCOUNTER — Ambulatory Visit: Payer: Medicare Other | Admitting: Physical Therapy

## 2023-05-15 ENCOUNTER — Encounter: Payer: Self-pay | Admitting: Physical Therapy

## 2023-05-15 DIAGNOSIS — R2689 Other abnormalities of gait and mobility: Secondary | ICD-10-CM

## 2023-05-15 DIAGNOSIS — R29818 Other symptoms and signs involving the nervous system: Secondary | ICD-10-CM | POA: Diagnosis not present

## 2023-05-15 DIAGNOSIS — R278 Other lack of coordination: Secondary | ICD-10-CM | POA: Diagnosis not present

## 2023-05-15 DIAGNOSIS — M25611 Stiffness of right shoulder, not elsewhere classified: Secondary | ICD-10-CM | POA: Diagnosis not present

## 2023-05-15 DIAGNOSIS — M6281 Muscle weakness (generalized): Secondary | ICD-10-CM | POA: Diagnosis not present

## 2023-05-15 DIAGNOSIS — R2681 Unsteadiness on feet: Secondary | ICD-10-CM | POA: Diagnosis not present

## 2023-05-22 ENCOUNTER — Ambulatory Visit: Payer: Medicare Other | Admitting: Physical Therapy

## 2023-05-22 ENCOUNTER — Ambulatory Visit: Payer: Medicare Other | Admitting: Occupational Therapy

## 2023-05-22 ENCOUNTER — Encounter: Payer: Self-pay | Admitting: Physical Therapy

## 2023-05-22 ENCOUNTER — Ambulatory Visit (INDEPENDENT_AMBULATORY_CARE_PROVIDER_SITE_OTHER): Payer: Medicare Other | Admitting: Psychiatry

## 2023-05-22 DIAGNOSIS — R278 Other lack of coordination: Secondary | ICD-10-CM

## 2023-05-22 DIAGNOSIS — R2689 Other abnormalities of gait and mobility: Secondary | ICD-10-CM | POA: Diagnosis not present

## 2023-05-22 DIAGNOSIS — R2681 Unsteadiness on feet: Secondary | ICD-10-CM

## 2023-05-22 DIAGNOSIS — M6281 Muscle weakness (generalized): Secondary | ICD-10-CM | POA: Diagnosis not present

## 2023-05-22 DIAGNOSIS — F419 Anxiety disorder, unspecified: Secondary | ICD-10-CM | POA: Diagnosis not present

## 2023-05-22 DIAGNOSIS — M25611 Stiffness of right shoulder, not elsewhere classified: Secondary | ICD-10-CM | POA: Diagnosis not present

## 2023-05-22 DIAGNOSIS — R29818 Other symptoms and signs involving the nervous system: Secondary | ICD-10-CM | POA: Diagnosis not present

## 2023-05-22 NOTE — Therapy (Signed)
OUTPATIENT OCCUPATIONAL THERAPY [facility]  Treatment Note  Patient Name: Andrea Burke MRN: [id number] DOB:1946-03-15, 77 y.o., female Today's Date: 05/22/2023  PCP: Catha Gosselin, MD REFERRING PROVIDER: Vladimir Faster, DO  END OF SESSION:  OT End of Session - 05/22/23 1020     Visit Number 4    Number of Visits 17    Date for OT Re-Evaluation 06/28/23    Authorization Type Medicare A&B    OT Start Time 0933    OT Stop Time 1015    OT Time Calculation (min) 42 min                Past Medical History:  Diagnosis Date   Cancer (HCC)    KIDNEY   Discoid lupus    Endometriosis    Headache(784.0)    Insomnia    Kidney disease    Lupus (HCC)    Osteopenia    Urinary incontinence    Past Surgical History:  Procedure Laterality Date   CATARACT EXTRACTION, BILATERAL     PELVIC LAPAROSCOPY     DIAG LASER LAP-ENDOMETRIOSIS   REMOVAL OF LEFT KIDNEY  10/08/2006   Patient Active Problem List   Diagnosis Date Noted   Major depressive disorder, single episode, moderate (HCC) 08/06/2018   Moderate major depression (HCC) 06/23/2018   Anxiety disorder 06/23/2018   Pruritus 04/01/2017   Onychomycosis 05/12/2012   Lentigines 05/12/2012   Discoid lupus    Cancer (HCC)    Headache(784.0)    Insomnia    Endometriosis    Osteopenia     ONSET DATE: referral 04/26/23 (recent PD diagnosis)  REFERRING DIAG: G20.A1 (ICD-10-CM) - Parkinson's disease without dyskinesia or fluctuating manifestations  THERAPY DIAG:  Other symptoms and signs involving the nervous system  Muscle weakness (generalized)  Other lack of coordination  Stiffness of right shoulder, not elsewhere classified  Rationale for Evaluation and Treatment: Rehabilitation  SUBJECTIVE:   SUBJECTIVE STATEMENT: Pt reports "I'm ready to go." Pt accompanied by: self  PERTINENT HISTORY: recent dx of PD, memory loss, depression  PRECAUTIONS: Fall, back pain/muscle spasms  WEIGHT BEARING  RESTRICTIONS: No  PAIN:  Are you having pain? Yes, reports "little bit" of tightness in back, unrated.  Performed stretches with PT and reports back is a little looser.   FALLS: Has patient fallen in last 6 months? No  LIVING ENVIRONMENT: Lives with: lives with their spouse Lives in: House/apartment Stairs: Yes: External: 2-3 steps; on left going up Has following equipment at home: Grab bars  PLOF: Independent  Enjoys volunteering at [employer]; enjoys travelling.  PATIENT GOALS: control the arm/hand tremors with hand use  OBJECTIVE:   HAND DOMINANCE: Right  ADLs: Overall ADLs: Increased time, difficulty with clothing fasteners Transfers/ambulation related to ADLs: Independent Eating: increased spilling and dropping of foods UB Dressing: difficulty with buttons and other clothing fasteners LB Dressing: increased time and difficulty with clothing fasteners Toileting: occasional need to pull self up Bathing: Independent Tub Shower transfers: Mod I, holding on to door and grab bar when stepping in/out of shower  IADLs: Light housekeeping: increased tremors impacting washing dishes, folding laundry Meal Prep: completes with increased time/effort Community mobility: driving Medication management: difficulty with coordination, dropping pills occasionally.  Does keep a log to ensure recall of when taking medications Handwriting: 100% legible and PPT #1 (whales live [place]): 14.72 sec, reports handwriting used to be more rounded and occasionally "arm jerks" when writing.  MOBILITY STATUS: Independent  POSTURE COMMENTS:  rounded shoulders and forward head  ACTIVITY TOLERANCE: Activity tolerance: WFL for tasks assessed on eval  FUNCTIONAL OUTCOME MEASURES: Standing functional reach: Right: 3 inches; Left: 3 inches Fastening/unfastening 3 buttons: 37.40 sec Physical performance test: PPT#2 (simulated eating) 14.12 sec & PPT#4 (donning/doffing jacket): 13.25  sec  COORDINATION: 9 Hole Peg test: Right: 24.0 sec; Left: 26.62 sec Box and Blocks:  Right 52 blocks, Left 50 blocks Tremors: Resting, action, and Right  UE ROM:   decreased shoulder flexion bilaterally, however WFL;  hold RUE in shoulder internal rotation, decreased internal rotation bilaterally, limited to reaching to top of hips not able to reach mid back.  SENSATION: Reports intermittent light tingling in R hand  MUSCLE TONE: RUE: Mild and Hypertonic  COGNITION: Overall cognitive status: Within functional limits for tasks assessed   TODAY'S TREATMENT:                                                                      DATE:  05/22/23 Self-feeding: engaged in discussion about hand placement and modifying grip strength to decrease impact of tremors on self-feeding and carrying cup.  Engaged in simulated self-feeding with scooping beans while modifying hand placement and proximity of grip towards bowl of spoon.  Discussed height and depth of various bowls as well as diameter of utensils to increase grip and motor control.  Discussed modifying grasp with on/off squeeze and release to improve motor control with scooping and carrying cup.   Handwriting: engaged in writing with variety of different types of pens with and without built up handle and or weight.  Reiterating hand placement and modified grasp/release for improved control and grip on pen handle.  Provided with handout of recommendations from [facility] to aid in legibility with handwriting. Trialed multiple suggestions with focus on printing, slowing down writing, and use of built up handle/pen grip. Discussed purchasing pen/pencil grips to aid in keeping hand more relaxed during writing.      05/13/23 Reviewed large amplitude HEP as pt with questions about PWR! Hands.  Pt demonstrating good amplitude with all exercises with elbows bent and straightened.  Pt demonstrating good carryover for PWR! Hands with full body  anterior weight shift with elbows and wrists extended and hands open. Large amplitude: engaged in large amplitude movements while standing incorporating purposeful opening of hand, supination, and trunk rotation.  Incorporated weight shifting and reaching outside BOS and across midline with RUE while picking up and placing rings from lower surface to vertical target.   Coordination: w/ RUE including: rotating cards in-hand/turning cards end-over end, pushing cards off deck held in-hand using thumb only, picking up various small objects/coins and placing into container, picking up coins and stacking/unstacking them, and picking up 5-10 coins 1 at a time and translating palm to fingertips to place in coin slot.  OT encouraged pt to focus on purposeful, large amplitude movements during each activity.    05/09/23 Large amplitude UB: OT instructed pt in large amplitude hands opening/closing, wrist flexion/extension, forearm supination/pronation, thumb opposition with elbows bent and extended.  OT providing demonstration and min cues for amplitude, noting decreased wrist flexion/extension.  Forearm extension with elbows straight, wrists extended, and fingers open.  OT providing cues for seated posture for increased anterior  weight shift during forearm extension.  Transitioned to modified LSVT big seated exercise with focus on reaching forward, towards floor, up towards ceiling, BUE out to side, and back to middle. OT providing box on floor to allow for improved reach towards floor with increased amplitude. UE ROM: engaged in prayer stretch for increased wrist extension.  OT encouraged pt to complete 2-3x/day as well as to complete prior to typing/handwriting to provide proprioceptive input as well. Coordination: engaged in card flipping with focus on carryover of large amplitude forearm rotation and big hand opening to pick up and release cards.  OT educated on functional carryover with forearm rotation and hand  opening into other household tasks and encouraged pt to observe movements during household tasks.    PATIENT EDUCATION: Education details: ongoing condition specific education Person educated: Patient Education method: Explanation Education comprehension: verbalized understanding and needs further education  HOME EXERCISE PROGRAM: Large amplitude exercises - see pt instructions  Access Code: [email or web address] URL: [email or web address] Date: 05/09/2023 Prepared by: [facility] - Outpatient  Rehab - [facility]  Exercises - Seated Wrist Prayer Stretch  - 2-3 x daily - 5 reps - 10-15 sec hold  GOALS: Goals reviewed with patient? Yes  SHORT TERM GOALS: Target date: 05/31/23  Pt will be independent with PD specific HEP. Baseline: Goal status: IN PROGRESS  2.  Pt will verbalize understanding of task modifications/adaptive strategies and/or potential AE needs to increase ease, safety, and independence w/ ADLs/IADLs (as needed for medication management) Baseline:  Goal status: IN PROGRESS  3.  Pt will verbalize understanding of ways to prevent future PD related complications and PD community resources. Baseline:  Goal status: IN PROGRESS   LONG TERM GOALS: Target date: 06/28/23  Pt will write a short paragraph with 100% legibility and no significant decrease in letter size. Baseline:  Goal status: IN PROGRESS  2.  Pt will demonstrate ability to retrieve a lightweight object from moderate height self with increased trunk flexion, shoulder flexion, and elbow extension. Baseline: Standing functional reach: Right: 3 inches; Left: 3 inches Goal status: IN PROGRESS  3.  Pt will demonstrate understanding of memory compensations and ways to keep thinking skills sharp Baseline:  Goal status: IN PROGRESS  4.  Pt will demonstrate improved UE functional use for ADLs as evidenced by increasing box/ blocks score by 4 blocks with RUE/LUE Baseline: R: 52, L: 50 Goal status: IN  PROGRESS  5.  Pt will demonstrate improved ease with fastening buttons as evidenced by decreasing 3 button/ unbutton time to <30 seconds Baseline: 37.4 sec Goal status: IN PROGRESS   ASSESSMENT:  CLINICAL IMPRESSION: Pt verbalizing and demonstrating good understanding of modifications of hand placement and grip when managing cup, eating utensils, and writing utensils with good carryover after review.  Pt pleased with modifications and plans to assess items at home and modify hand placement and/or positioning during routine coordination tasks (handwriting and eating)  PERFORMANCE DEFICITS: in functional skills including ADLs, IADLs, coordination, tone, ROM, strength, flexibility, Fine motor control, Gross motor control, balance, body mechanics, endurance, decreased knowledge of precautions, decreased knowledge of use of DME, and UE functional use, cognitive skills including memory, problem solving, and safety awareness, and psychosocial skills including coping strategies and environmental adaptation.   IMPAIRMENTS: are limiting patient from ADLs and IADLs.      PLAN:  OT FREQUENCY: 2x/week  OT DURATION: 8 weeks  PLANNED INTERVENTIONS: self care/ADL training, therapeutic exercise, therapeutic activity, neuromuscular re-education, passive range of motion, functional mobility  training, ultrasound, compression bandaging, moist heat, cryotherapy, patient/family education, cognitive remediation/compensation, psychosocial skills training, energy conservation, coping strategies training, and DME and/or AE instructions  RECOMMENDED OTHER SERVICES: NA  CONSULTED AND AGREED WITH PLAN OF CARE: Patient  PLAN FOR NEXT SESSION: review coordination HEP, attempt ball toss/rotation, review wrist extension and add flexion if needed, review grip/release pattern for motor control, begin to problem solve compensations for medication (memory, coordination)   , , OTR/L 05/22/2023, 10:21 AM
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
## 2023-05-22 NOTE — Progress Notes (Signed)
[facility] Counselor/Therapist Progress Note  Patient ID: Andrea Burke, MRN: [id number],    Date: 05/22/2023  Time Spent: 48 minutes   Treatment Type: Individual Therapy  Reported Symptoms:  anxious, depression, continues to work on accepting dx of Parkinson's recently and is following recommendations of neurologist, concerns about an issue with her husband's health  Mental Status Exam:  Appearance:   Casual and Neat     Behavior:  Appropriate, Sharing, and Motivated  Motor:  Tremor  Speech/Language:   Clear and Coherent  Affect:  Flat  Mood:  anxious  Thought process:  goal directed  Thought content:    WNL  Sensory/Perceptual disturbances:    WNL  Orientation:  oriented to person, place, time/date, situation, day of week, month of year, year, and stated date of May 22, 2023  Attention:  Fair  Concentration:  Fair  Memory:  Some short term memory issues and Dr is aware  Fund of knowledge:   Good  Insight:    Good and Fair  Judgment:   Good  Impulse Control:  Good   Risk Assessment: Danger to Self:  No Self-injurious Behavior: No Danger to Others: No Duty to Warn:no Physical Aggression / Violence:No  Access to Firearms a concern: No  Gang Involvement:No   Subjective:  Patient in session today reporting anxiety and depression related to her dx of Parkinson's and some fears surrounding that which she was able to talk through more today and receive support. Tearfulness decreased over time in our session. Also concerned about a very recent discovery of a growth on her husband and it is being removed this Friday. Patient concerned about that  but added that she and husband are trying to support each other but is difficult due to both of them being "scared and thinking about down the road and how things might be for them." Offered support to patient as she tries not to "assume the worst."  Interventions: Cognitive Behavioral Therapy and Ego-Supportive  Long-Term  goal: (measurable)  Enhance the ability to handle effectively the full variety of life's anxieties. Patient will eventually rate herself as a "3 or under on a 1-10 scale for depression and for anxiety, for a period of at least 2 months."  Strategies:  Patient will work on re-framing thoughts that have been leading her to feel more anxious.  The re-framed thoughts will support more calmness and personal empowerment for patient.     Short Term goal:  Increase understanding of beliefs and messages that produce worry and anxiety. Strategies: Patient will work with therapist to gain more understanding of how anxiety and worry are supported by certain beliefs and messages.  She will be able to give at least 3 examples of such messages in an upcoming session.  Diagnosis:   ICD-10-CM   1. Anxiety disorder, unspecified type  F41.9      Plan:  Patient in session today and continues her work on anxiety, depression, fearful thinking, related to her recent diagnosis of Parkinson's disease.  Patient had been fearful that she had Parkinson's but it was confirmed recently.  Continued working with some of her fears and anxieties but also trying to focus more on some of the treatment she is getting and exercises she can do to try to help "myself manage better".  Husband is supportive although not very expressive with it.  Just recently husband discovered a growth in groin area and has seen Dr, with surgery scheduled for end of  this week. Continue to encourage patient to talk through her feelings but not assuming worst-case scenarios" and follow-through on what her doctor recommends for her as she tries to stay and live more in the present, having positive family contact frequently. Reminded and encouraged patient in practicing more positive and self affirming behaviors as noted in session including: Trust her ability to follow through on recommendations per her neurologist regarding coping with Parkinson's symptoms,  use of positive self talk, trying to refrain from assuming worst-case scenarios, asking for help when she needs it, limiting her watching of distressing news on TV or online as this accentuates her anxiety,  maintain contact with people who are supportive, continue on her prescribed medications, and realize the strength she shows working with goal-directed behaviors to move in a direction that supports her improved emotional health and overall wellbeing.   Goal review and progress/challenges noted with patient.  Next appointment within 3 weeks.   Mathis Fare, LCSW

## 2023-05-24 ENCOUNTER — Ambulatory Visit: Payer: Medicare Other | Admitting: Physical Therapy

## 2023-05-30 ENCOUNTER — Ambulatory Visit: Payer: Medicare Other | Admitting: Physical Therapy

## 2023-05-30 ENCOUNTER — Encounter: Payer: Self-pay | Admitting: Physical Therapy

## 2023-05-30 ENCOUNTER — Ambulatory Visit: Payer: Medicare Other | Admitting: Occupational Therapy

## 2023-05-30 DIAGNOSIS — M25611 Stiffness of right shoulder, not elsewhere classified: Secondary | ICD-10-CM | POA: Diagnosis not present

## 2023-05-30 DIAGNOSIS — R2681 Unsteadiness on feet: Secondary | ICD-10-CM | POA: Diagnosis not present

## 2023-05-30 DIAGNOSIS — R29818 Other symptoms and signs involving the nervous system: Secondary | ICD-10-CM

## 2023-05-30 DIAGNOSIS — R278 Other lack of coordination: Secondary | ICD-10-CM

## 2023-05-30 DIAGNOSIS — M6281 Muscle weakness (generalized): Secondary | ICD-10-CM | POA: Diagnosis not present

## 2023-05-30 DIAGNOSIS — R2689 Other abnormalities of gait and mobility: Secondary | ICD-10-CM | POA: Diagnosis not present

## 2023-05-30 NOTE — Therapy (Signed)
OUTPATIENT OCCUPATIONAL THERAPY PARKINSON'S  Treatment Note  Patient Name: Andrea Burke MRN: [id number] DOB:05-Dec-1945, 77 y.o., female Today's Date: 05/30/2023  PCP: Catha Gosselin, MD REFERRING PROVIDER: Vladimir Faster, DO  END OF SESSION:  OT End of Session - 05/30/23 1110     Visit Number 5    Number of Visits 17    Date for OT Re-Evaluation 06/28/23    Authorization Type Medicare A&B    OT Start Time 1102    OT Stop Time 1144    OT Time Calculation (min) 42 min                 Past Medical History:  Diagnosis Date   Cancer (HCC)    KIDNEY   Discoid lupus    Endometriosis    Headache(784.0)    Insomnia    Kidney disease    Lupus (HCC)    Osteopenia    Urinary incontinence    Past Surgical History:  Procedure Laterality Date   CATARACT EXTRACTION, BILATERAL     PELVIC LAPAROSCOPY     DIAG LASER LAP-ENDOMETRIOSIS   REMOVAL OF LEFT KIDNEY  10/08/2006   Patient Active Problem List   Diagnosis Date Noted   Major depressive disorder, single episode, moderate (HCC) 08/06/2018   Moderate major depression (HCC) 06/23/2018   Anxiety disorder 06/23/2018   Pruritus 04/01/2017   Onychomycosis 05/12/2012   Lentigines 05/12/2012   Discoid lupus    Cancer (HCC)    Headache(784.0)    Insomnia    Endometriosis    Osteopenia     ONSET DATE: referral 04/26/23 (recent PD diagnosis)  REFERRING DIAG: G20.A1 (ICD-10-CM) - Parkinson's disease without dyskinesia or fluctuating manifestations  THERAPY DIAG:  Other symptoms and signs involving the nervous system  Muscle weakness (generalized)  Other lack of coordination  Stiffness of right shoulder, not elsewhere classified  Unsteadiness on feet  Rationale for Evaluation and Treatment: Rehabilitation  SUBJECTIVE:   SUBJECTIVE STATEMENT: Pt reports utilizing large amplitude hand opening exercises when typing and picking up paperclips on counter top. Pt accompanied by: self  PERTINENT HISTORY:  recent dx of PD, memory loss, depression  PRECAUTIONS: Fall, back pain/muscle spasms  WEIGHT BEARING RESTRICTIONS: No  PAIN:  Are you having pain? Yes, reports "little bit" of tightness in back, unrated.  Performed stretches with PT and reports back is a little looser.   FALLS: Has patient fallen in last 6 months? No  LIVING ENVIRONMENT: Lives with: lives with their spouse Lives in: House/apartment Stairs: Yes: External: 2-3 steps; on left going up Has following equipment at home: Grab bars  PLOF: Independent  Enjoys volunteering at [employer]; enjoys travelling.  PATIENT GOALS: control the arm/hand tremors with hand use  OBJECTIVE:   HAND DOMINANCE: Right  ADLs: Overall ADLs: Increased time, difficulty with clothing fasteners Transfers/ambulation related to ADLs: Independent Eating: increased spilling and dropping of foods UB Dressing: difficulty with buttons and other clothing fasteners LB Dressing: increased time and difficulty with clothing fasteners Toileting: occasional need to pull self up Bathing: Independent Tub Shower transfers: Mod I, holding on to door and grab bar when stepping in/out of shower  IADLs: Light housekeeping: increased tremors impacting washing dishes, folding laundry Meal Prep: completes with increased time/effort Community mobility: driving Medication management: difficulty with coordination, dropping pills occasionally.  Does keep a log to ensure recall of when taking medications Handwriting: 100% legible and PPT #1 (whales live in a blue ocean): 14.72 sec, reports handwriting used to  be more rounded and occasionally "arm jerks" when writing.  MOBILITY STATUS: Independent  POSTURE COMMENTS:  rounded shoulders and forward head  ACTIVITY TOLERANCE: Activity tolerance: WFL for tasks assessed on eval  FUNCTIONAL OUTCOME MEASURES: Standing functional reach: Right: 3 inches; Left: 3 inches Fastening/unfastening 3 buttons: 37.40 sec Physical  performance test: PPT#2 (simulated eating) 14.12 sec & PPT#4 (donning/doffing jacket): 13.25 sec  COORDINATION: 9 Hole Peg test: Right: 24.0 sec; Left: 26.62 sec Box and Blocks:  Right 52 blocks, Left 50 blocks Tremors: Resting, action, and Right  UE ROM:   decreased shoulder flexion bilaterally, however WFL;  hold RUE in shoulder internal rotation, decreased internal rotation bilaterally, limited to reaching to top of hips not able to reach mid back.  SENSATION: Reports intermittent light tingling in R hand  MUSCLE TONE: RUE: Mild and Hypertonic  COGNITION: Overall cognitive status: Within functional limits for tasks assessed   TODAY'S TREATMENT:                                                                      DATE:  05/30/23 Resistance Clothespins 4,6# with RUE, followed by LUE for low and high functional reaching and sustained pinch in standing while reaching outside BOS and across midline. Pt dropped clothespins x2, stooping to retreive with close supervision.  Pt demonstrating good amplitude and reports no pain in R shoulder with movement.  Transitioned to forward reach with resistance clothespins from seated position to facilitate increased shoulder flexion and pinch strength. PD resources: provided pt with handout of compiled in-person classes for individuals with PD as well as handout of local and online resources, highlighting online/virtual classes that pt can do at her own pace.   UE ROM: engaged in internal/external shoulder rotation with and without use of dowel.  OT providing demonstration and cues for functional carryover, recommending placement of wash cloth under arm to maintain appropriate positioning during external rotation.  Pt demonstrating good understanding of each. Exercises: - Standing Shoulder External Rotation AAROM with Dowel  - 2-3 x daily - 10 reps - Standing Bilateral Shoulder Internal Rotation AAROM with Dowel  - 2-3 x daily - 10 reps - Standing Shoulder  Internal Rotation Stretch Behind Back  - 2-3 x daily - 10 reps - 5 sec hold   05/22/23 Self-feeding: engaged in discussion about hand placement and modifying grip strength to decrease impact of tremors on self-feeding and carrying cup.  Engaged in simulated self-feeding with scooping beans while modifying hand placement and proximity of grip towards bowl of spoon.  Discussed height and depth of various bowls as well as diameter of utensils to increase grip and motor control.  Discussed modifying grasp with on/off squeeze and release to improve motor control with scooping and carrying cup.   Handwriting: engaged in writing with variety of different types of pens with and without built up handle and or weight.  Reiterating hand placement and modified grasp/release for improved control and grip on pen handle.  Provided with handout of recommendations from [facility] to aid in legibility with handwriting. Trialed multiple suggestions with focus on printing, slowing down writing, and use of built up handle/pen grip. Discussed purchasing pen/pencil grips to aid in keeping hand more relaxed during writing.  05/13/23 Reviewed large amplitude HEP as pt with questions about PWR! Hands.  Pt demonstrating good amplitude with all exercises with elbows bent and straightened.  Pt demonstrating good carryover for [employer]WR! Hands with full body anterior weight shift with elbows and wrists extended and hands open. Large amplitude: engaged in large amplitude movements while standing incorporating purposeful opening of hand, supination, and trunk rotation.  Incorporated weight shifting and reaching outside BOS and across midline with RUE while picking up and placing rings from lower surface to vertical target.   Coordination: w/ RUE including: rotating cards in-hand/turning cards end-over end, pushing cards off deck held in-hand using thumb only, picking up various small objects/coins and placing into container,  picking up coins and stacking/unstacking them, and picking up 5-10 coins 1 at a time and translating palm to fingertips to place in coin slot.  OT encouraged pt to focus on purposeful, large amplitude movements during each activity.    PATIENT EDUCATION: Education details: ongoing condition specific education Person educated: Patient Education method: Explanation Education comprehension: verbalized understanding and needs further education  HOME EXERCISE PROGRAM: Large amplitude exercises - see pt instructions  Access Code: [email or web address] URL: [email or web address] Date: 05/09/2023 Prepared by: [facility] - [facility]   GOALS: Goals reviewed with patient? Yes  SHORT TERM GOALS: Target date: 05/31/23  Pt will be independent with PD specific HEP. Baseline: Goal status: MET - 05/30/23  2.  Pt will verbalize understanding of task modifications/adaptive strategies and/or potential AE needs to increase ease, safety, and independence w/ ADLs/IADLs (as needed for medication management) Baseline:  Goal status: MET - 05/30/23  3.  Pt will verbalize understanding of ways to prevent future PD related complications and PD community resources. Baseline:  Goal status: MET - 05/30/23   LONG TERM GOALS: Target date: 06/28/23  Pt will write a short paragraph with 100% legibility and no significant decrease in letter size. Baseline:  Goal status: IN PROGRESS  2.  Pt will demonstrate ability to retrieve a lightweight object from moderate height self with increased trunk flexion, shoulder flexion, and elbow extension. Baseline: Standing functional reach: Right: 3 inches; Left: 3 inches Goal status: IN PROGRESS  3.  Pt will demonstrate understanding of memory compensations and ways to keep thinking skills sharp Baseline:  Goal status: IN PROGRESS  4.  Pt will demonstrate improved UE functional use for ADLs as evidenced by increasing box/ blocks score by 4  blocks with RUE/LUE Baseline: R: 52, L: 50 Goal status: IN PROGRESS  5.  Pt will demonstrate improved ease with fastening buttons as evidenced by decreasing 3 button/ unbutton time to <30 seconds Baseline: 37.4 sec Goal status: IN PROGRESS   ASSESSMENT:  CLINICAL IMPRESSION: Pt expressing concerns with ability to engage in in-person PD classes and programs due to new health concerns with her spouse.  Pt appreciative of resources for online and virtual classes to incorporate around her spouse's appointments.  Pt demonstrating good understanding and UE ROM exercises and completion during resistance exercises this session.  PERFORMANCE DEFICITS: in functional skills including ADLs, IADLs, coordination, tone, ROM, strength, flexibility, Fine motor control, Gross motor control, balance, body mechanics, endurance, decreased knowledge of precautions, decreased knowledge of use of DME, and UE functional use, cognitive skills including memory, problem solving, and safety awareness, and psychosocial skills including coping strategies and environmental adaptation.   IMPAIRMENTS: are limiting patient from ADLs and IADLs.      PLAN:  OT FREQUENCY: 2x/week  OT DURATION:  8 weeks  PLANNED INTERVENTIONS: self care/ADL training, therapeutic exercise, therapeutic activity, neuromuscular re-education, passive range of motion, functional mobility training, ultrasound, compression bandaging, moist heat, cryotherapy, patient/family education, cognitive remediation/compensation, psychosocial skills training, energy conservation, coping strategies training, and DME and/or AE instructions  RECOMMENDED OTHER SERVICES: NA  CONSULTED AND AGREED WITH PLAN OF CARE: Patient  PLAN FOR NEXT SESSION: review coordination HEP, attempt ball toss/rotation, review wrist extension and add flexion if needed, review grip/release pattern for motor control, begin to problem solve compensations for medication (memory,  coordination)   Roe Koffman, OTR/L 05/30/2023, 11:10 AM

## 2023-05-30 NOTE — Therapy (Signed)
OUTPATIENT PHYSICAL THERAPY NEURO TREATMENT   Patient Name: Andrea Burke MRN: [id number] DOB:[birth year], 77 y.o., female Today's Date: 05/30/2023   PCP: Catha Gosselin, MD REFERRING PROVIDER: Vladimir Faster, DO   END OF SESSION:  PT End of Session - 05/30/23 1152     Visit Number 9    Number of Visits 16    Date for PT Re-Evaluation 06/14/23    Authorization Type Medicare/BCBS    Progress Note Due on Visit 10    PT Start Time 1150    PT Stop Time 1230    PT Time Calculation (min) 40 min    Equipment Utilized During Treatment Gait belt    Activity Tolerance Patient tolerated treatment well    Behavior During Therapy WFL for tasks assessed/performed                     Past Medical History:  Diagnosis Date   Cancer (HCC)    KIDNEY   Discoid lupus    Endometriosis    Headache(784.0)    Insomnia    Kidney disease    Lupus (HCC)    Osteopenia    Urinary incontinence    Past Surgical History:  Procedure Laterality Date   CATARACT EXTRACTION, BILATERAL     PELVIC LAPAROSCOPY     DIAG LASER LAP-ENDOMETRIOSIS   REMOVAL OF LEFT KIDNEY  10/08/2006   Patient Active Problem List   Diagnosis Date Noted   Major depressive disorder, single episode, moderate (HCC) 08/06/2018   Moderate major depression (HCC) 06/23/2018   Anxiety disorder 06/23/2018   Pruritus 04/01/2017   Onychomycosis 05/12/2012   Lentigines 05/12/2012   Discoid lupus    Cancer (HCC)    Headache(784.0)    Insomnia    Endometriosis    Osteopenia     ONSET DATE: 04/17/2023 (MD referral)  REFERRING DIAG: G20.A1 (ICD-10-CM) - Parkinson's disease without dyskinesia or fluctuating manifestations   THERAPY DIAG:  Other symptoms and signs involving the nervous system  Muscle weakness (generalized)  Unsteadiness on feet  Rationale for Evaluation and Treatment: Rehabilitation  SUBJECTIVE:                                                                                                                                                                                              SUBJECTIVE STATEMENT: Husband has gotten a new diagnosis so we are juggling more appointments.  Pt accompanied by: self  PERTINENT HISTORY: recent dx of PD, memory loss, depression  PAIN:  Are you having pain? No   PRECAUTIONS: Fall  RED FLAGS: None   WEIGHT BEARING RESTRICTIONS:  No  FALLS: Has patient fallen in last 6 months? No  LIVING ENVIRONMENT: Lives with: lives with their spouse Lives in: House/apartment Stairs: Yes: External: 2 steps; on left going up Has following equipment at home: None  PLOF: Independent  Enjoys volunteering at [employer]; enjoys travelling.  PATIENT GOALS: Want to do my activities without falling and continue to travel  OBJECTIVE:    TODAY'S TREATMENT: 05/30/2023 Activity Comments  Sit<>stand with PWR! Up posture, 2 x 10 reps 2nd set standing on [employer]  Forward/back walking x 2 minutes Cues for increased step length forward; cues for hinge at hips for improved balance in posterior direction  Sidestepping x 2 minutes With added coordinated arm motions  Standing PWR! Moves using [employer]: -PWR! UP for posture -PWR! Rock for weightshifting -PWR! Twist-midline and low positions to targets PWR! Step to targets  Good form with return demo, and pt reports good increase in intensity  Gait with use of pool noodles, PT assist for reciprocal arm motion, 85 ft x 3 reps Cues to focus on increased step length and RELAXED arms       Access Code: [email or web address] URL: [email or web address] Date: 05/30/2023 Prepared by: [facility] - Outpatient  Rehab - [facility]  Exercises - Sit to Stand Without Arm Support  - 1 x daily - 5 x weekly - 2 sets - 10 reps - Backward Walking with Counter Support  - 1 x daily - 5 x weekly - 1 sets - 3-5 reps - Side Stepping with Counter Support  - 1 x daily - 5 x weekly - 1 sets - 3-5 reps    PATIENT  EDUCATION: Education details: Additions to HEP for dynamic balance/gait activities Person educated: Patient Education method: Explanation Education comprehension: verbalized understanding     Below measures were taken at time of initial evaluation unless otherwise specified:   DIAGNOSTIC FINDINGS: NA for this episode  COGNITION: Overall cognitive status: Within functional limits for tasks assessed and reports fear and embarrassment in regards to tremors   SENSATION: WFL  MUSCLE TONE: RLE: Mild   POSTURE: rounded shoulders, forward head, and holds RUE in shoulder internal rotation, elbow flexion, fisted hand (able to extend and open up with cues)  LOWER EXTREMITY ROM:   WFL BLEs in sitting   LOWER EXTREMITY MMT:    MMT Right Eval Left Eval  Hip flexion 4 4  Hip extension    Hip abduction    Hip adduction    Hip internal rotation    Hip external rotation    Knee flexion 4 4  Knee extension 4+ 4  Ankle dorsiflexion 4+ 4  Ankle plantarflexion    Ankle inversion    Ankle eversion    (Blank rows = not tested)   TRANSFERS: Assistive device utilized: None  Sit to stand: SBA Stand to sit: SBA STAIRS: Level of Assistance: Modified independence Stair Negotiation Technique: Alternating Pattern  with Single Rail on Right Number of Stairs: 2-3  Height of Stairs: 4-6"    GAIT: Gait pattern:  holds RUE with LUE to prevent increased tremors, step through pattern, decreased arm swing- Left, decreased step length- Right, decreased trunk rotation, and narrow BOS Distance walked: 50 ft x 4 reps Assistive device utilized: None Level of assistance: SBA   FUNCTIONAL TESTS:  5 times sit to stand: 20.78 sec with arms crossed at ches Timed up and go (TUG): 12.31 sec Dynamic Gait Index: 14/24 TUG cognitive:11.32 sec (slowed counting) 56M walk: 10.12 sec (  3.24 ft/sec)      TODAY'S TREATMENT:                                                                                                                               DATE: 04/22/2023    PATIENT EDUCATION: Education details: Eval results, POC, rationale for OT to address RUE tremor and bradykinesia; seated PWR! UP initiated for HEP Person educated: Patient and Spouse Education method: Explanation, Demonstration, and Handouts Education comprehension: verbalized understanding, returned demonstration, and needs further education  HOME EXERCISE PROGRAM: Initiated seated PWR! Up with handouts-perform 1-2x/daily, 5-10 reps  GOALS: Goals reviewed with patient? Yes  SHORT TERM GOALS: Target date: 05/17/2023  Pt will be independent with HEP for improved balance, strength, gait. Baseline: Goal status: MET 05/06/23  2.  Pt will improve 5x sit<>stand to less than or equal to 15 sec to demonstrate improved functional strength and transfer efficiency. Baseline: 20.78 sec Goal status: IN PROGRESS  3.  Pt will verbalize understanding of local Parkinson's disease community resources.  Baseline: newly dx with PD, July 2024; provided 05/02/23 Goal status: MET 05/02/23   LONG TERM GOALS: Target date: 06/14/2023  Pt will be independent with HEP for improved balance, strength, gait. Baseline:  Goal status: IN PROGRESS  2.  Pt will improve 5x sit<>stand to less than or equal to 12.5 sec to demonstrate improved functional strength and transfer efficiency. Baseline: 20.78 sec Goal status: IN PROGRESS  3.  Pt will improve DGI score to at least 19/24 to decrease fall risk. Baseline: 14/24 Goal status: IN PROGRESS  4.  Pt will verbalize plans for continued community fitness upon d/c from PT to maximize gains made in PT. Baseline:  Goal status:IN PROGRESS  ASSESSMENT:  CLINICAL IMPRESSION: Pt arrives today, no more c/o back pain, but she has had to cancel previous appointment, due to some medical issues with husband.  Worked on more dynamic balance and gait activities today with addition of coordinated large arm  movement patterns to drive improved intensity and amplitude of movement.  She likes use of [employer]/pool noodles with standing PWR! Moves today.  She continues to focus on arm swing pattern, and often goes to ipsalateral arm swing, but when cued to focus on increased step length and relaxed arm swing, she does a better job with reciprocal arm motion.   She will continue to benefit from skilled PT towards goals for improved functional mobility and balance.  OBJECTIVE IMPAIRMENTS: Abnormal gait, decreased balance, decreased mobility, difficulty walking, decreased strength, impaired tone, and postural dysfunction.   ACTIVITY LIMITATIONS: carrying, bending, standing, transfers, reach over head, and locomotion level  PARTICIPATION LIMITATIONS: meal prep, cleaning, laundry, driving, shopping, and community activity  PERSONAL FACTORS: 3+ comorbidities: see above; pt newly diagnosed with PD < 1 week ago  are also affecting patient's functional outcome.   REHAB POTENTIAL: Good  CLINICAL DECISION MAKING: Evolving/moderate complexity  EVALUATION COMPLEXITY: Moderate  PLAN:  PT FREQUENCY:  2x/week  PT DURATION: 8 weeks, including eval week  PLANNED INTERVENTIONS: Therapeutic exercises, Therapeutic activity, Neuromuscular re-education, Balance training, Gait training, Patient/Family education, Self Care, and DME instructions  PLAN FOR NEXT SESSION: Review additions to HEP.  Continue towards LTGs.  work on standing PWR! Moves, gait and balance activities to incorporate RUE arm swing, sit<>Stand/squats.    Lonia Blood, PT 05/30/23 1:00 PM Phone: [phone] Fax: [phone]  [facility] Re[facility] at [facility] [address], [address] [place], [place] [address] Phone # [phone] Fax # [phone]

## 2023-05-31 DIAGNOSIS — G20A1 Parkinson's disease without dyskinesia, without mention of fluctuations: Secondary | ICD-10-CM | POA: Diagnosis not present

## 2023-05-31 DIAGNOSIS — R413 Other amnesia: Secondary | ICD-10-CM | POA: Diagnosis not present

## 2023-05-31 DIAGNOSIS — M545 Low back pain, unspecified: Secondary | ICD-10-CM | POA: Diagnosis not present

## 2023-05-31 DIAGNOSIS — R7989 Other specified abnormal findings of blood chemistry: Secondary | ICD-10-CM | POA: Diagnosis not present

## 2023-05-31 DIAGNOSIS — F321 Major depressive disorder, single episode, moderate: Secondary | ICD-10-CM | POA: Diagnosis not present

## 2023-06-03 ENCOUNTER — Ambulatory Visit: Payer: Medicare Other | Admitting: Physical Therapy

## 2023-06-03 ENCOUNTER — Ambulatory Visit: Payer: Medicare Other | Admitting: Occupational Therapy

## 2023-06-03 ENCOUNTER — Encounter: Payer: Self-pay | Admitting: Physical Therapy

## 2023-06-03 DIAGNOSIS — R2689 Other abnormalities of gait and mobility: Secondary | ICD-10-CM | POA: Diagnosis not present

## 2023-06-03 DIAGNOSIS — M25611 Stiffness of right shoulder, not elsewhere classified: Secondary | ICD-10-CM

## 2023-06-03 DIAGNOSIS — R2681 Unsteadiness on feet: Secondary | ICD-10-CM

## 2023-06-03 DIAGNOSIS — R29818 Other symptoms and signs involving the nervous system: Secondary | ICD-10-CM

## 2023-06-03 DIAGNOSIS — R278 Other lack of coordination: Secondary | ICD-10-CM | POA: Diagnosis not present

## 2023-06-03 DIAGNOSIS — M6281 Muscle weakness (generalized): Secondary | ICD-10-CM | POA: Diagnosis not present

## 2023-06-03 NOTE — Therapy (Signed)
OUTPATIENT PHYSICAL THERAPY NEURO TREATMENT/10th VISIT PROGRESS NOTE   Patient Name: Andrea Burke MRN: [id number] DOB:1946/08/05, 77 y.o., female Today's Date: 06/03/2023   PCP: Catha Gosselin, MD REFERRING PROVIDER: Vladimir Faster, DO   Progress Note Reporting Period 04/22/2023 to 06/03/2023  See note below for Objective Data and Assessment of Progress/Goals.      END OF SESSION:  PT End of Session - 06/03/23 0937     Visit Number 10    Number of Visits 16    Date for PT Re-Evaluation 06/14/23    Authorization Type Medicare/BCBS    Progress Note Due on Visit 10    PT Start Time 0935    PT Stop Time 1015    PT Time Calculation (min) 40 min    Activity Tolerance Patient tolerated treatment well    Behavior During Therapy WFL for tasks assessed/performed                      Past Medical History:  Diagnosis Date   Cancer (HCC)    KIDNEY   Discoid lupus    Endometriosis    Headache(784.0)    Insomnia    Kidney disease    Lupus (HCC)    Osteopenia    Urinary incontinence    Past Surgical History:  Procedure Laterality Date   CATARACT EXTRACTION, BILATERAL     PELVIC LAPAROSCOPY     DIAG LASER LAP-ENDOMETRIOSIS   REMOVAL OF LEFT KIDNEY  10/08/2006   Patient Active Problem List   Diagnosis Date Noted   Major depressive disorder, single episode, moderate (HCC) 08/06/2018   Moderate major depression (HCC) 06/23/2018   Anxiety disorder 06/23/2018   Pruritus 04/01/2017   Onychomycosis 05/12/2012   Lentigines 05/12/2012   Discoid lupus    Cancer (HCC)    Headache(784.0)    Insomnia    Endometriosis    Osteopenia     ONSET DATE: 04/17/2023 (MD referral)  REFERRING DIAG: G20.A1 (ICD-10-CM) - Parkinson's disease without dyskinesia or fluctuating manifestations   THERAPY DIAG:  Other symptoms and signs involving the nervous system  Muscle weakness (generalized)  Unsteadiness on feet  Rationale for Evaluation and Treatment:  Rehabilitation  SUBJECTIVE:                                                                                                                                                                                             SUBJECTIVE STATEMENT: "Don't know if the exercise or the medicine, but feel the episodes aren't as many, not as unsteady.  Still have some shuffling, but feel like the exercise is helping.  Pt accompanied by: self  PERTINENT HISTORY: recent dx of PD, memory loss, depression  PAIN:  Are you having pain? No   PRECAUTIONS: Fall  RED FLAGS: None   WEIGHT BEARING RESTRICTIONS: No  FALLS: Has patient fallen in last 6 months? No  LIVING ENVIRONMENT: Lives with: lives with their spouse Lives in: House/apartment Stairs: Yes: External: 2 steps; on left going up Has following equipment at home: None  PLOF: Independent  Enjoys volunteering at [employer]; enjoys travelling.  PATIENT GOALS: Want to do my activities without falling and continue to travel  OBJECTIVE:    TODAY'S TREATMENT: 06/03/2023 Activity Comments  NuStep, Level 4, 4 extremities x 8 minutes  Aerobic warm-up, keeps SPM >85-90  5x sit<>stand 10.78 sec  Improved from 20.78 sec at eval  DGI 19/24 Improved from 14/24  MiniBESTest 23/28 See below  Forward step and weightshift x 10 reps   Back step and weightshift x 10 reps Cues to hinge at hips for increased posterior weightshift       Access Code: [email or web address] URL: [email or web address] Date: 05/30/2023 Prepared by: [facility] - Outpatient  Rehab - [facility]  Exercises - Sit to Stand Without Arm Support  - 1 x daily - 5 x weekly - 2 sets - 10 reps - Backward Walking with Counter Support  - 1 x daily - 5 x weekly - 1 sets - 3-5 reps - Side Stepping with Counter Support  - 1 x daily - 5 x weekly - 1 sets - 3-5 reps    PATIENT EDUCATION: Education details: Review of HEP; progress towards goals Person educated: Patient Education  method: Explanation Education comprehension: verbalized understanding     Below measures were taken at time of initial evaluation unless otherwise specified:   DIAGNOSTIC FINDINGS: NA for this episode  COGNITION: Overall cognitive status: Within functional limits for tasks assessed and reports fear and embarrassment in regards to tremors   SENSATION: WFL  MUSCLE TONE: RLE: Mild   POSTURE: rounded shoulders, forward head, and holds RUE in shoulder internal rotation, elbow flexion, fisted hand (able to extend and open up with cues)  LOWER EXTREMITY ROM:   WFL BLEs in sitting   LOWER EXTREMITY MMT:    MMT Right Eval Left Eval  Hip flexion 4 4  Hip extension    Hip abduction    Hip adduction    Hip internal rotation    Hip external rotation    Knee flexion 4 4  Knee extension 4+ 4  Ankle dorsiflexion 4+ 4  Ankle plantarflexion    Ankle inversion    Ankle eversion    (Blank rows = not tested)   TRANSFERS: Assistive device utilized: None  Sit to stand: SBA Stand to sit: SBA STAIRS: Level of Assistance: Modified independence Stair Negotiation Technique: Alternating Pattern  with Single Rail on Right Number of Stairs: 2-3  Height of Stairs: 4-6"    GAIT: Gait pattern:  holds RUE with LUE to prevent increased tremors, step through pattern, decreased arm swing- Left, decreased step length- Right, decreased trunk rotation, and narrow BOS Distance walked: 50 ft x 4 reps Assistive device utilized: None Level of assistance: SBA   FUNCTIONAL TESTS:  5 times sit to stand: 20.78 sec with arms crossed at ches Timed up and go (TUG): 12.31 sec Dynamic Gait Index: 14/24 TUG cognitive:11.32 sec (slowed counting) 33M walk: 10.12 sec (3.24 ft/sec)   [facility] PT Assessment - 06/03/23 0948       Standardized Balance  Assessment   Standardized Balance Assessment Dynamic Gait Index;Mini-BESTest      Mini-BESTest   Sit To Stand Normal: Comes to stand without use of hands  and stabilizes independently.    Rise to Toes Normal: Stable for 3 s with maximum height.    Stand on one leg (left) Moderate: < 20 s   5.53, 1.13   Stand on one leg (right) Moderate: < 20 s   3.15, 9.84 sec   Stand on one leg - lowest score 1    Compensatory Stepping Correction - Forward Normal: Recovers independently with a single, large step (second realignement is allowed).    Compensatory Stepping Correction - Backward Moderate: More than one step is required to recover equilibrium    Compensatory Stepping Correction - Left Lateral Normal: Recovers independently with 1 step (crossover or lateral OK)    Compensatory Stepping Correction - Right Lateral Normal: Recovers independently with 1 step (crossover or lateral OK)    Stepping Corredtion Lateral - lowest score 2    Stance - Feet together, eyes open, firm surface  Normal: 30s    Stance - Feet together, eyes closed, foam surface  Moderate: < 30s   4 sec, 8 sec   Incline - Eyes Closed Normal: Stands independently 30s and aligns with gravity    Change in Gait Speed Normal: Significantly changes walkling speed without imbalance    Walk with head turns - Horizontal Moderate: performs head turns with reduction in gait speed.    Walk with pivot turns Normal: Turns with feet close FAST (< 3 steps) with good balance.    Step over obstacles Moderate: Steps over box but touches box OR displays cautious behavior by slowing gait.    Timed UP & GO with Dual Task Normal: No noticeable change in sitting, standing or walking while backward counting when compared to TUG without   12.37 sec, 11.19 sec   Mini-BEST total score 23      Dynamic Gait Index   Level Surface Normal   5.72   Change in Gait Speed Normal    Gait with Horizontal Head Turns Mild Impairment    Gait with Vertical Head Turns Mild Impairment    Gait and Pivot Turn Normal    Step Over Obstacle Moderate Impairment    Step Around Obstacles Normal    Steps Mild Impairment    Total Score  19    DGI comment: improved from 14/24               TODAY'S TREATMENT:                                                                                                                              DATE: 04/22/2023    PATIENT EDUCATION: Education details: Eval results, POC, rationale for OT to address RUE tremor and bradykinesia; seated PWR! UP initiated for HEP Person educated: Patient and Spouse Education method: Explanation, Demonstration, and Handouts Education  comprehension: verbalized understanding, returned demonstration, and needs further education  HOME EXERCISE PROGRAM: Initiated seated PWR! Up with handouts-perform 1-2x/daily, 5-10 reps  GOALS: Goals reviewed with patient? Yes  SHORT TERM GOALS: Target date: 05/17/2023  Pt will be independent with HEP for improved balance, strength, gait. Baseline: Goal status: MET 05/06/23  2.  Pt will improve 5x sit<>stand to less than or equal to 15 sec to demonstrate improved functional strength and transfer efficiency. Baseline: 20.78 sec>10.78 sec 06/03/2023 Goal status: MET 06/03/2023  3.  Pt will verbalize understanding of local Parkinson's disease community resources.  Baseline: newly dx with PD, July 2024; provided 05/02/23 Goal status: MET 05/02/23   LONG TERM GOALS: Target date: 06/14/2023  Pt will be independent with HEP for improved balance, strength, gait. Baseline:  Goal status: IN PROGRESS  2.  Pt will improve 5x sit<>stand to less than or equal to 12.5 sec to demonstrate improved functional strength and transfer efficiency. Baseline: 20.78 sec Goal status: IN PROGRESS  3.  Pt will improve DGI score to at least 19/24 to decrease fall risk. Baseline: 14/24>19/24 06/03/2023 Goal status: MET, 06/03/2023  4.  Pt will verbalize plans for continued community fitness upon d/c from PT to maximize gains made in PT. Baseline:  Goal status:IN PROGRESS  ASSESSMENT:  CLINICAL IMPRESSION: 10th Visit Progress Note:  Pt  reports noting some improvement in PD symptoms and feels like exercises are helping.  Objective measures today:  5x sit<>stand:  10.78 sec (improved from 20/78 sec), DGI 19/24 (improved from 14/24).  MiniBESTest performed with pt scoring 23/28; she does have particular difficulty with EC on foam surface and takes multiple steps with posterior push and release test.  Worked on forward and backward step and weightshift exercises as part of step strategy for balance recovery.  She will continue to benefit from skilled PT towards LTGs for improved balance, overall functional mobility.   OBJECTIVE IMPAIRMENTS: Abnormal gait, decreased balance, decreased mobility, difficulty walking, decreased strength, impaired tone, and postural dysfunction.   ACTIVITY LIMITATIONS: carrying, bending, standing, transfers, reach over head, and locomotion level  PARTICIPATION LIMITATIONS: meal prep, cleaning, laundry, driving, shopping, and community activity  PERSONAL FACTORS: 3+ comorbidities: see above; pt newly diagnosed with PD < 1 week ago  are also affecting patient's functional outcome.   REHAB POTENTIAL: Good  CLINICAL DECISION MAKING: Evolving/moderate complexity  EVALUATION COMPLEXITY: Moderate  PLAN:  PT FREQUENCY: 2x/week  PT DURATION: 8 weeks, including eval week  PLANNED INTERVENTIONS: Therapeutic exercises, Therapeutic activity, Neuromuscular re-education, Balance training, Gait training, Patient/Family education, Self Care, and DME instructions  PLAN FOR NEXT SESSION: Add to HEP:  forward/backward step and weightshift; compliant surface EC.  Continue towards LTGs.  Gait and balance activities to incorporate RUE arm swing.  Work towards planned discharge next week    Lonia Blood, PT 06/03/23 10:26 AM Phone: [phone] Fax: [phone]  [facility] Re[facility] at [facility] [address] [place], [address] [place], [place] [address] Phone # [phone] Fax # [phone]

## 2023-06-03 NOTE — Therapy (Signed)
OUTPATIENT OCCUPATIONAL THERAPY [facility]  Treatment Note  Patient Name: MALON IGNATIUS MRN: [id number] DOB:[birth year], 77 y.o., female Today's Date: 06/03/2023  PCP: Catha Gosselin, MD REFERRING PROVIDER: Vladimir Faster, DO  END OF SESSION:  OT End of Session - 06/03/23 1022     Visit Number 6    Number of Visits 17    Date for OT Re-Evaluation 06/28/23    Authorization Type Medicare A&B    OT Start Time 1020    OT Stop Time 1100    OT Time Calculation (min) 40 min                  Past Medical History:  Diagnosis Date   Cancer (HCC)    KIDNEY   Discoid lupus    Endometriosis    Headache(784.0)    Insomnia    Kidney disease    Lupus (HCC)    Osteopenia    Urinary incontinence    Past Surgical History:  Procedure Laterality Date   CATARACT EXTRACTION, BILATERAL     PELVIC LAPAROSCOPY     DIAG LASER LAP-ENDOMETRIOSIS   REMOVAL OF LEFT KIDNEY  10/08/2006   Patient Active Problem List   Diagnosis Date Noted   Major depressive disorder, single episode, moderate (HCC) 08/06/2018   Moderate major depression (HCC) 06/23/2018   Anxiety disorder 06/23/2018   Pruritus 04/01/2017   Onychomycosis 05/12/2012   Lentigines 05/12/2012   Discoid lupus    Cancer (HCC)    Headache(784.0)    Insomnia    Endometriosis    Osteopenia     ONSET DATE: referral 04/26/23 (recent PD diagnosis)  REFERRING DIAG: G20.A1 (ICD-10-CM) - Parkinson's disease without dyskinesia or fluctuating manifestations  THERAPY DIAG:  Other symptoms and signs involving the nervous system  Muscle weakness (generalized)  Other lack of coordination  Stiffness of right shoulder, not elsewhere classified  Rationale for Evaluation and Treatment: Rehabilitation  SUBJECTIVE:   SUBJECTIVE STATEMENT: Pt reports having a quiet weekend.  Pt reports previously being able recall items, complete mental math, but is now forgetting steps in routines and has left iron on occasionally.   Pt  accompanied by: self  PERTINENT HISTORY: recent dx of PD, memory loss, depression  PRECAUTIONS: Fall, back pain/muscle spasms  WEIGHT BEARING RESTRICTIONS: No  PAIN:  Are you having pain? Yes, reports "little bit" of tightness in back, unrated.  Performed stretches with PT and reports back is a little looser.   FALLS: Has patient fallen in last 6 months? No  LIVING ENVIRONMENT: Lives with: lives with their spouse Lives in: House/apartment Stairs: Yes: External: 2-3 steps; on left going up Has following equipment at home: Grab bars  PLOF: Independent  Enjoys volunteering at [employer]; enjoys travelling.  PATIENT GOALS: control the arm/hand tremors with hand use  OBJECTIVE:   HAND DOMINANCE: Right  ADLs: Overall ADLs: Increased time, difficulty with clothing fasteners Transfers/ambulation related to ADLs: Independent Eating: increased spilling and dropping of foods UB Dressing: difficulty with buttons and other clothing fasteners LB Dressing: increased time and difficulty with clothing fasteners Toileting: occasional need to pull self up Bathing: Independent Tub Shower transfers: Mod I, holding on to door and grab bar when stepping in/out of shower  IADLs: Light housekeeping: increased tremors impacting washing dishes, folding laundry Meal Prep: completes with increased time/effort Community mobility: driving Medication management: difficulty with coordination, dropping pills occasionally.  Does keep a log to ensure recall of when taking medications Handwriting: 100% legible and PPT #1 (  [place]): 14.72 sec, reports handwriting used to be more rounded and occasionally "arm jerks" when writing.  MOBILITY STATUS: Independent  POSTURE COMMENTS:  rounded shoulders and forward head  ACTIVITY TOLERANCE: Activity tolerance: WFL for tasks assessed on eval  FUNCTIONAL OUTCOME MEASURES: Standing functional reach: Right: 3 inches; Left: 3  inches Fastening/unfastening 3 buttons: 37.40 sec Physical performance test: PPT#2 (simulated eating) 14.12 sec & PPT#4 (donning/doffing jacket): 13.25 sec  COORDINATION: 9 Hole Peg test: Right: 24.0 sec; Left: 26.62 sec Box and Blocks:  Right 52 blocks, Left 50 blocks Tremors: Resting, action, and Right  UE ROM:   decreased shoulder flexion bilaterally, however WFL;  hold RUE in shoulder internal rotation, decreased internal rotation bilaterally, limited to reaching to top of hips not able to reach mid back.  SENSATION: Reports intermittent light tingling in R hand  MUSCLE TONE: RUE: Mild and Hypertonic  COGNITION: Overall cognitive status: Within functional limits for tasks assessed   TODAY'S TREATMENT:                                                                      DATE:  06/03/23 Pill box assessment: Completed in 5:05 with no errors.  Pt required increased time due to looking back through each pill bottle to ensure completing each prescription.   Medication management: OT then provided education on various strategies to increase recall with sequencing by organizing pill bottles beforehand and moving to other side of table or flipping over.  OT also educating on use of wash cloth/towel to decrease bouncing of pills if/when she drops them to decrease dropping pills to floor.  Encouraged pt to have a consistent routine with meds and provided recommendations for various styles of pill boxes to aid in organization and recall.   Memory: provided pt with memory compensation strategies and discussed various ones that may be most beneficial for pt with focus on routine, use of calendar/lists/reminders.  Provided pt with handout. UE ROM: reviewed internal/external shoulder rotation with and without use of dowel, as pt did not complete any of these since last visit.  OT providing demonstration and cues for functional carryover, recommending placement of wash cloth under arm to maintain  appropriate positioning during external rotation.      05/30/23 Resistance Clothespins 4,6# with RUE, followed by LUE for low and high functional reaching and sustained pinch in standing while reaching outside BOS and across midline. Pt dropped clothespins x2, stooping to retreive with close supervision.  Pt demonstrating good amplitude and reports no pain in R shoulder with movement.  Transitioned to forward reach with resistance clothespins from seated position to facilitate increased shoulder flexion and pinch strength. PD resources: provided pt with handout of compiled in-person classes for individuals with PD as well as handout of local and online resources, highlighting online/virtual classes that pt can do at her own pace.   UE ROM: engaged in internal/external shoulder rotation with and without use of dowel.  OT providing demonstration and cues for functional carryover, recommending placement of wash cloth under arm to maintain appropriate positioning during external rotation.  Pt demonstrating good understanding of each. Exercises: - Standing Shoulder External Rotation AAROM with Dowel  - 2-3 x daily - 10 reps -  Standing Bilateral Shoulder Internal Rotation AAROM with Dowel  - 2-3 x daily - 10 reps - Standing Shoulder Internal Rotation Stretch Behind Back  - 2-3 x daily - 10 reps - 5 sec hold   05/22/23 Self-feeding: engaged in discussion about hand placement and modifying grip strength to decrease impact of tremors on self-feeding and carrying cup.  Engaged in simulated self-feeding with scooping beans while modifying hand placement and proximity of grip towards bowl of spoon.  Discussed height and depth of various bowls as well as diameter of utensils to increase grip and motor control.  Discussed modifying grasp with on/off squeeze and release to improve motor control with scooping and carrying cup.   Handwriting: engaged in writing with variety of different types of pens with and without  built up handle and or weight.  Reiterating hand placement and modified grasp/release for improved control and grip on pen handle.  Provided with handout of recommendations from [facility] to aid in legibility with handwriting. Trialed multiple suggestions with focus on printing, slowing down writing, and use of built up handle/pen grip. Discussed purchasing pen/pencil grips to aid in keeping hand more relaxed during writing.     PATIENT EDUCATION: Education details: ongoing condition specific education Person educated: Patient Education method: Explanation Education comprehension: verbalized understanding and needs further education  HOME EXERCISE PROGRAM: Large amplitude exercises - see pt instructions  Access Code: [email or web address] URL: [email or web address] Date: 05/09/2023 Prepared by: [facility] - Outpatient  Rehab - [facility]   GOALS: Goals reviewed with patient? Yes  SHORT TERM GOALS: Target date: 05/31/23  Pt will be independent with PD specific HEP. Baseline: Goal status: MET - 05/30/23  2.  Pt will verbalize understanding of task modifications/adaptive strategies and/or potential AE needs to increase ease, safety, and independence w/ ADLs/IADLs (as needed for medication management) Baseline:  Goal status: MET - 05/30/23  3.  Pt will verbalize understanding of ways to prevent future PD related complications and PD community resources. Baseline:  Goal status: MET - 05/30/23   LONG TERM GOALS: Target date: 06/28/23  Pt will write a short paragraph with 100% legibility and no significant decrease in letter size. Baseline:  Goal status: IN PROGRESS  2.  Pt will demonstrate ability to retrieve a lightweight object from moderate height self with increased trunk flexion, shoulder flexion, and elbow extension. Baseline: Standing functional reach: Right: 3 inches; Left: 3 inches Goal status: IN PROGRESS  3.  Pt will demonstrate understanding of memory  compensations and ways to keep thinking skills sharp Baseline:  Goal status: IN PROGRESS  4.  Pt will demonstrate improved UE functional use for ADLs as evidenced by increasing box/ blocks score by 4 blocks with RUE/LUE Baseline: R: 52, L: 50 Goal status: IN PROGRESS  5.  Pt will demonstrate improved ease with fastening buttons as evidenced by decreasing 3 button/ unbutton time to <30 seconds Baseline: 37.4 sec Goal status: IN PROGRESS   ASSESSMENT:  CLINICAL IMPRESSION: Pt demonstrating decreased recall and sequencing with Pill Box assessment, benefiting from cues and strategies to improve recall.  Pt still with decreased motor control with [facility] and [facility] tasks, therefore continue to recommend engaging in HEP for shoulder and use of modifications to increase grasp and success with [facility] tasks.  PERFORMANCE DEFICITS: in functional skills including ADLs, IADLs, coordination, tone, ROM, strength, flexibility, Fine motor control, Gross motor control, balance, body mechanics, endurance, decreased knowledge of precautions, decreased knowledge of use of DME, and UE functional use, cognitive  skills including memory, problem solving, and safety awareness, and psychosocial skills including coping strategies and environmental adaptation.   IMPAIRMENTS: are limiting patient from ADLs and IADLs.      PLAN:  OT FREQUENCY: 2x/week  OT DURATION: 8 weeks  PLANNED INTERVENTIONS: self care/ADL training, therapeutic exercise, therapeutic activity, neuromuscular re-education, passive range of motion, functional mobility training, ultrasound, compression bandaging, moist heat, cryotherapy, patient/family education, cognitive remediation/compensation, psychosocial skills training, energy conservation, coping strategies training, and DME and/or AE instructions  RECOMMENDED OTHER SERVICES: NA  CONSULTED AND AGREED WITH PLAN OF CARE: Patient  PLAN FOR NEXT SESSION: review coordination HEP, attempt ball  toss/rotation, review wrist extension and add flexion if needed, review grip/release pattern for motor control, review compensations for medication (memory, coordination)   Owen Pratte, OTR/L 06/03/2023, 10:22 AM

## 2023-06-03 NOTE — Patient Instructions (Signed)
Memory Compensation Strategies  Use "WARM" strategy.  W= write it down  A= associate it  R= repeat it  M= make a mental note  2.   You can keep a Memory Notebook.  Use a 3-ring notebook with sections for the following: calendar, important names and phone numbers, medications, doctors' names/phone numbers, lists/reminders, and a section to journal what you did each day.   3.    Use a calendar to write appointments down.  4.    Write yourself a schedule for the day.  This can be placed on the calendar or in a separate section of the Memory Notebook.  Keeping a  regular schedule can help memory.  5.    Use medication organizer with sections for each day or morning/evening pills.  You may need help loading it  6.    Keep a basket, or pegboard by the door.  Place items that you need to take out with you in the basket or on the pegboard.  You may also want to include a message board for reminders.  7.    Use sticky notes.  Place sticky notes with reminders in a place where the task is performed.  For example: " turn off the  stove" placed by the stove, "lock the door" placed on the door at eye level, " take your medications" on the bathroom mirror or by the place where you normally take your medications.  8.    Use alarms/timers.  Use while cooking to remind yourself to check on food or as a reminder to take your medicine, or as a  reminder to make a call, or as a reminder to perform another task, etc.  

## 2023-06-05 ENCOUNTER — Encounter: Payer: Self-pay | Admitting: Physical Therapy

## 2023-06-05 ENCOUNTER — Ambulatory Visit: Payer: Medicare Other | Admitting: Physical Therapy

## 2023-06-05 ENCOUNTER — Ambulatory Visit: Payer: Medicare Other | Admitting: Occupational Therapy

## 2023-06-05 DIAGNOSIS — M25611 Stiffness of right shoulder, not elsewhere classified: Secondary | ICD-10-CM

## 2023-06-05 DIAGNOSIS — R29818 Other symptoms and signs involving the nervous system: Secondary | ICD-10-CM | POA: Diagnosis not present

## 2023-06-05 DIAGNOSIS — M6281 Muscle weakness (generalized): Secondary | ICD-10-CM | POA: Diagnosis not present

## 2023-06-05 DIAGNOSIS — R2681 Unsteadiness on feet: Secondary | ICD-10-CM | POA: Diagnosis not present

## 2023-06-05 DIAGNOSIS — R278 Other lack of coordination: Secondary | ICD-10-CM

## 2023-06-05 DIAGNOSIS — R2689 Other abnormalities of gait and mobility: Secondary | ICD-10-CM | POA: Diagnosis not present

## 2023-06-05 NOTE — Therapy (Signed)
OUTPATIENT PHYSICAL THERAPY NEURO TREATMENT   Patient Name: Andrea Burke MRN: [id number] DOB:1946-08-23, 77 y.o., female Today's Date: 06/05/2023   PCP: Catha Gosselin, MD REFERRING PROVIDER: Vladimir Faster, DO      END OF SESSION:  PT End of Session - 06/05/23 1108     Visit Number 11    Number of Visits 16    Date for PT Re-Evaluation 06/14/23    Authorization Type Medicare/BCBS    Progress Note Due on Visit 10    PT Start Time 1106    PT Stop Time 1145    PT Time Calculation (min) 39 min    Activity Tolerance Patient tolerated treatment well    Behavior During Therapy WFL for tasks assessed/performed                       Past Medical History:  Diagnosis Date   Cancer (HCC)    KIDNEY   Discoid lupus    Endometriosis    Headache(784.0)    Insomnia    Kidney disease    Lupus (HCC)    Osteopenia    Urinary incontinence    Past Surgical History:  Procedure Laterality Date   CATARACT EXTRACTION, BILATERAL     PELVIC LAPAROSCOPY     DIAG LASER LAP-ENDOMETRIOSIS   REMOVAL OF LEFT KIDNEY  10/08/2006   Patient Active Problem List   Diagnosis Date Noted   Major depressive disorder, single episode, moderate (HCC) 08/06/2018   Moderate major depression (HCC) 06/23/2018   Anxiety disorder 06/23/2018   Pruritus 04/01/2017   Onychomycosis 05/12/2012   Lentigines 05/12/2012   Discoid lupus    Cancer (HCC)    Headache(784.0)    Insomnia    Endometriosis    Osteopenia     ONSET DATE: 04/17/2023 (MD referral)  REFERRING DIAG: G20.A1 (ICD-10-CM) - Parkinson's disease without dyskinesia or fluctuating manifestations   THERAPY DIAG:  Other symptoms and signs involving the nervous system  Muscle weakness (generalized)  Unsteadiness on feet  Rationale for Evaluation and Treatment: Rehabilitation  SUBJECTIVE:                                                                                                                                                                                              SUBJECTIVE STATEMENT: Feel like I remember the exercises when I'm in the kitchen.    Pt accompanied by: self  PERTINENT HISTORY: recent dx of PD, memory loss, depression  PAIN:  Are you having pain? No   PRECAUTIONS: Fall  RED FLAGS: None   WEIGHT BEARING RESTRICTIONS: No  FALLS:  Has patient fallen in last 6 months? No  LIVING ENVIRONMENT: Lives with: lives with their spouse Lives in: House/apartment Stairs: Yes: External: 2 steps; on left going up Has following equipment at home: None  PLOF: Independent  Enjoys volunteering at [employer]; enjoys travelling.  PATIENT GOALS: Want to do my activities without falling and continue to travel  OBJECTIVE:    TODAY'S TREATMENT: 06/05/2023 Activity Comments  Educated in [place]! Moves exercise class starting next visit   Standing PWR! Moves: -PWR! Up -PWR! [address] -[place]! Twist -PWR! Step X 10 reps  Forward step and weightshift x 10 Back step and weightshift x 10 Added to HEP     Access Code: [email or web address] URL: [email or web address] Date: 06/05/2023 Prepared by: [facility] - Outpatient  Rehab - [facility]  Exercises - Sit to Stand Without Arm Support  - 1 x daily - 5 x weekly - 2 sets - 10 reps - Backward Walking with Counter Support  - 1 x daily - 5 x weekly - 1 sets - 3-5 reps - Side Stepping with Counter Support  - 1 x daily - 5 x weekly - 1 sets - 3-5 reps - Alternating Step forward-Balance Reaction  - 1 x daily - 5 x weekly - 1 sets - 10 reps - Alternating Step Backward with Support  - 1 x daily - 5 x weekly - 1 sets - 10 reps  PATIENT EDUCATION: Education details: Additions to HEP; educated in new PWR! Moves classes restarting 06/12/23 and benefits of community PWR! Moves class Person educated: Patient Education method: Explanation Education comprehension: verbalized understanding     Below measures were taken at time of initial evaluation unless  otherwise specified:   DIAGNOSTIC FINDINGS: NA for this episode  COGNITION: Overall cognitive status: Within functional limits for tasks assessed and reports fear and embarrassment in regards to tremors   SENSATION: WFL  MUSCLE TONE: RLE: Mild   POSTURE: rounded shoulders, forward head, and holds RUE in shoulder internal rotation, elbow flexion, fisted hand (able to extend and open up with cues)  LOWER EXTREMITY ROM:   WFL BLEs in sitting   LOWER EXTREMITY MMT:    MMT Right Eval Left Eval  Hip flexion 4 4  Hip extension    Hip abduction    Hip adduction    Hip internal rotation    Hip external rotation    Knee flexion 4 4  Knee extension 4+ 4  Ankle dorsiflexion 4+ 4  Ankle plantarflexion    Ankle inversion    Ankle eversion    (Blank rows = not tested)   TRANSFERS: Assistive device utilized: None  Sit to stand: SBA Stand to sit: SBA STAIRS: Level of Assistance: Modified independence Stair Negotiation Technique: Alternating Pattern  with Single Rail on Right Number of Stairs: 2-3  Height of Stairs: 4-6"    GAIT: Gait pattern:  holds RUE with LUE to prevent increased tremors, step through pattern, decreased arm swing- Left, decreased step length- Right, decreased trunk rotation, and narrow BOS Distance walked: 50 ft x 4 reps Assistive device utilized: None Level of assistance: SBA   FUNCTIONAL TESTS:  5 times sit to stand: 20.78 sec with arms crossed at ches Timed up and go (TUG): 12.31 sec Dynamic Gait Index: 14/24 TUG cognitive:11.32 sec (slowed counting) 63M walk: 10.12 sec (3.24 ft/sec)       TODAY'S TREATMENT:  DATE: 04/22/2023    PATIENT EDUCATION: Education details: Eval results, POC, rationale for OT to address RUE tremor and bradykinesia; seated PWR! UP initiated for HEP Person educated: Patient and  Spouse Education method: Explanation, Demonstration, and Handouts Education comprehension: verbalized understanding, returned demonstration, and needs further education  HOME EXERCISE PROGRAM: Initiated seated PWR! Up with handouts-perform 1-2x/daily, 5-10 reps  GOALS: Goals reviewed with patient? Yes  SHORT TERM GOALS: Target date: 05/17/2023  Pt will be independent with HEP for improved balance, strength, gait. Baseline: Goal status: MET 05/06/23  2.  Pt will improve 5x sit<>stand to less than or equal to 15 sec to demonstrate improved functional strength and transfer efficiency. Baseline: 20.78 sec>10.78 sec 06/03/2023 Goal status: MET 06/03/2023  3.  Pt will verbalize understanding of local Parkinson's disease community resources.  Baseline: newly dx with PD, July 2024; provided 05/02/23 Goal status: MET 05/02/23   LONG TERM GOALS: Target date: 06/14/2023  Pt will be independent with HEP for improved balance, strength, gait. Baseline:  Goal status: IN PROGRESS  2.  Pt will improve 5x sit<>stand to less than or equal to 12.5 sec to demonstrate improved functional strength and transfer efficiency. Baseline: 20.78 sec Goal status: IN PROGRESS  3.  Pt will improve DGI score to at least 19/24 to decrease fall risk. Baseline: 14/24>19/24 06/03/2023 Goal status: MET, 06/03/2023  4.  Pt will verbalize plans for continued community fitness upon d/c from PT to maximize gains made in PT. Baseline: plans to join PWR! Moves [place] class Goal status:MET, 06/05/2023  ASSESSMENT:  CLINICAL IMPRESSION: Skilled PT session today focused on adding forward/back step and weightshift exercises as part of step strategy for balance.  Reviewed standing PWR! Moves and added them onto the edge, as additional step exercises.  Also provided education on PWR! Moves community class at [facility], starting back next week, which patient is interested in joining.  She is reporting utilizing some of the  big movement patterns with activities in the kitchen, indicating improved awareness of large amplitude movement patterns.  She is planning to finish PT next week, but if pt feels she will have more questions for PT and would benefit from several additional visits as she starts community exercise class, to help ease that transition, then we may need to recert. OBJECTIVE IMPAIRMENTS: Abnormal gait, decreased balance, decreased mobility, difficulty walking, decreased strength, impaired tone, and postural dysfunction.   ACTIVITY LIMITATIONS: carrying, bending, standing, transfers, reach over head, and locomotion level  PARTICIPATION LIMITATIONS: meal prep, cleaning, laundry, driving, shopping, and community activity  PERSONAL FACTORS: 3+ comorbidities: see above; pt newly diagnosed with PD < 1 week ago  are also affecting patient's functional outcome.   REHAB POTENTIAL: Good  CLINICAL DECISION MAKING: Evolving/moderate complexity  EVALUATION COMPLEXITY: Moderate  PLAN:  PT FREQUENCY: 2x/week  PT DURATION: 8 weeks, including eval week  PLANNED INTERVENTIONS: Therapeutic exercises, Therapeutic activity, Neuromuscular re-education, Balance training, Gait training, Patient/Family education, Self Care, and DME instructions  PLAN FOR NEXT SESSION: Review additions to HEP and check LTGs.  Work towards planned discharge next week (unless we think pt may need to stay at reduced frequency with PT as she transitions into the PWR! Moves class)  Lonia Blood, PT 06/05/23 12:46 PM Phone: [phone] Fax: [phone]  [facility] at [facility] [address] [place], [place] [address] Phone # [phone] Fax # [phone]

## 2023-06-05 NOTE — Therapy (Signed)
OUTPATIENT OCCUPATIONAL THERAPY [facility]  Treatment Note  Patient Name: Andrea Burke MRN: [id number] DOB:1946/09/12, 77 y.o., female Today's Date: 06/05/2023  PCP: Catha Gosselin, MD REFERRING PROVIDER: Vladimir Faster, DO  END OF SESSION:  OT End of Session - 06/05/23 1022     Visit Number 7    Number of Visits 17    Date for OT Re-Evaluation 06/28/23    Authorization Type Medicare A&B    OT Start Time 1020    OT Stop Time 1100    OT Time Calculation (min) 40 min                   Past Medical History:  Diagnosis Date   Cancer (HCC)    KIDNEY   Discoid lupus    Endometriosis    Headache(784.0)    Insomnia    Kidney disease    Lupus (HCC)    Osteopenia    Urinary incontinence    Past Surgical History:  Procedure Laterality Date   CATARACT EXTRACTION, BILATERAL     PELVIC LAPAROSCOPY     DIAG LASER LAP-ENDOMETRIOSIS   REMOVAL OF LEFT KIDNEY  10/08/2006   Patient Active Problem List   Diagnosis Date Noted   Major depressive disorder, single episode, moderate (HCC) 08/06/2018   Moderate major depression (HCC) 06/23/2018   Anxiety disorder 06/23/2018   Pruritus 04/01/2017   Onychomycosis 05/12/2012   Lentigines 05/12/2012   Discoid lupus    Cancer (HCC)    Headache(784.0)    Insomnia    Endometriosis    Osteopenia     ONSET DATE: referral 04/26/23 (recent PD diagnosis)  REFERRING DIAG: G20.A1 (ICD-10-CM) - Parkinson's disease without dyskinesia or fluctuating manifestations  THERAPY DIAG:  Other symptoms and signs involving the nervous system  Muscle weakness (generalized)  Other lack of coordination  Stiffness of right shoulder, not elsewhere classified  Rationale for Evaluation and Treatment: Rehabilitation  SUBJECTIVE:   SUBJECTIVE STATEMENT: Pt reports they still don't have any treatments or appts scheduled for her husband.  She would like to continue on with therapy as long as she can. Pt accompanied by: self  PERTINENT  HISTORY: recent dx of PD, memory loss, depression  PRECAUTIONS: Fall, back pain/muscle spasms  WEIGHT BEARING RESTRICTIONS: No  PAIN:  Are you having pain? Yes, reports "little bit" of tightness in back, unrated.  Performed stretches with PT and reports back is a little looser.   FALLS: Has patient fallen in last 6 months? No  LIVING ENVIRONMENT: Lives with: lives with their spouse Lives in: House/apartment Stairs: Yes: External: 2-3 steps; on left going up Has following equipment at home: Grab bars  PLOF: Independent  Enjoys volunteering at [employer]; enjoys travelling.  PATIENT GOALS: control the arm/hand tremors with hand use  OBJECTIVE:   HAND DOMINANCE: Right  ADLs: Overall ADLs: Increased time, difficulty with clothing fasteners Transfers/ambulation related to ADLs: Independent Eating: increased spilling and dropping of foods UB Dressing: difficulty with buttons and other clothing fasteners LB Dressing: increased time and difficulty with clothing fasteners Toileting: occasional need to pull self up Bathing: Independent Tub Shower transfers: Mod I, holding on to door and grab bar when stepping in/out of shower  IADLs: Light housekeeping: increased tremors impacting washing dishes, folding laundry Meal Prep: completes with increased time/effort Community mobility: driving Medication management: difficulty with coordination, dropping pills occasionally.  Does keep a log to ensure recall of when taking medications Handwriting: 100% legible and PPT #1 (whales [place]  [place]): 14.72 sec, reports handwriting used to be more rounded and occasionally "arm jerks" when writing.  MOBILITY STATUS: Independent  POSTURE COMMENTS:  rounded shoulders and forward head  ACTIVITY TOLERANCE: Activity tolerance: WFL for tasks assessed on eval  FUNCTIONAL OUTCOME MEASURES: 06/05/23 Standing functional reach: Right: 7.5 inches; Left: 7.5 inches Fastening/unfastening 3  buttons: 32.90 sec  EVAL Standing functional reach: Right: 3 inches; Left: 3 inches Fastening/unfastening 3 buttons: 37.40 sec Physical performance test: PPT#2 (simulated eating) 14.12 sec & PPT#4 (donning/doffing jacket): 13.25 sec  COORDINATION: 06/05/23 Box and blocks: Right: 48 blocks, Left: 50 blocks  9 Hole Peg test: Right: 24.0 sec; Left: 26.62 sec Box and Blocks:  Right 52 blocks, Left 50 blocks Tremors: Resting, action, and Right  UE ROM:   decreased shoulder flexion bilaterally, however WFL;  hold RUE in shoulder internal rotation, decreased internal rotation bilaterally, limited to reaching to top of hips not able to reach mid back.  SENSATION: Reports intermittent light tingling in R hand  MUSCLE TONE: RUE: Mild and Hypertonic  COGNITION: Overall cognitive status: Within functional limits for tasks assessed   TODAY'S TREATMENT:                                                                      DATE:  06/05/23 Dynamic standing: engaged in forward reaching and reaching outside BOS to facilitate increased reach.  Pt demonstrating improved shoulder flexion and forward reach, reporting stretch through RUE but no pain.  Completed bilaterally for increased reach and motor control.  OT educating on functional carryover to putting away dishes or retrieving clothing from front loader washer/dryer.   Coordination: Box and blocks: noted pt with decreased shoulder flexion and guarding with RUE. Button/unbutton: mild improvements in time.  OT educated on full grasp onto button and "push and pull" method when fastening and unfastening buttons.  Pt returning demonstration, will still benefit from additional practice. Provided pt with coordination handout and verbal instructions, however due to time constraints will need to review next session.   06/03/23 Pill box assessment: Completed in 5:05 with no errors.  Pt required increased time due to looking back through each pill bottle to  ensure completing each prescription.   Medication management: OT then provided education on various strategies to increase recall with sequencing by organizing pill bottles beforehand and moving to other side of table or flipping over.  OT also educating on use of wash cloth/towel to decrease bouncing of pills if/when she drops them to decrease dropping pills to floor.  Encouraged pt to have a consistent routine with meds and provided recommendations for various styles of pill boxes to aid in organization and recall.   Memory: provided pt with memory compensation strategies and discussed various ones that may be most beneficial for pt with focus on routine, use of calendar/lists/reminders.  Provided pt with handout. UE ROM: reviewed internal/external shoulder rotation with and without use of dowel, as pt did not complete any of these since last visit.  OT providing demonstration and cues for functional carryover, recommending placement of wash cloth under arm to maintain appropriate positioning during external rotation.      05/30/23 Resistance Clothespins 4,6# with RUE, followed by LUE for low and high functional  reaching and sustained pinch in standing while reaching outside BOS and across midline. Pt dropped clothespins x2, stooping to retreive with close supervision.  Pt demonstrating good amplitude and reports no pain in R shoulder with movement.  Transitioned to forward reach with resistance clothespins from seated position to facilitate increased shoulder flexion and pinch strength. PD resources: provided pt with handout of compiled in-person classes for individuals with PD as well as handout of local and online resources, highlighting online/virtual classes that pt can do at her own pace.   UE ROM: engaged in internal/external shoulder rotation with and without use of dowel.  OT providing demonstration and cues for functional carryover, recommending placement of wash cloth under arm to maintain  appropriate positioning during external rotation.  Pt demonstrating good understanding of each. Exercises: - Standing Shoulder External Rotation AAROM with Dowel  - 2-3 x daily - 10 reps - Standing Bilateral Shoulder Internal Rotation AAROM with Dowel  - 2-3 x daily - 10 reps - Standing Shoulder Internal Rotation Stretch Behind Back  - 2-3 x daily - 10 reps - 5 sec hold   PATIENT EDUCATION: Education details: ongoing condition specific education Person educated: Patient Education method: Explanation Education comprehension: verbalized understanding and needs further education  HOME EXERCISE PROGRAM: Large amplitude exercises - see pt instructions  Access Code: [email or web address] URL: [email or web address] Date: 05/09/2023 Prepared by: [facility] - Outpatient  Re[facility] - [facility]   GOALS: Goals reviewed with patient? Yes  SHORT TERM GOALS: Target date: 05/31/23  Pt will be independent with PD specific HEP. Baseline: Goal status: MET - 05/30/23  2.  Pt will verbalize understanding of task modifications/adaptive strategies and/or potential AE needs to increase ease, safety, and independence w/ ADLs/IADLs (as needed for medication management) Baseline:  Goal status: MET - 05/30/23  3.  Pt will verbalize understanding of ways to prevent future PD related complications and PD community resources. Baseline:  Goal status: MET - 05/30/23   LONG TERM GOALS: Target date: 06/28/23  Pt will write a short paragraph with 100% legibility and no significant decrease in letter size. Baseline:  Goal status: IN PROGRESS  2.  Pt will demonstrate ability to retrieve a lightweight object from moderate height self with increased trunk flexion, shoulder flexion, and elbow extension. Baseline: Standing functional reach: Right: 3 inches; Left: 3 inches Goal status: MET - standing functional reach 7.5 inches bilaterally on 06/05/23  3.  Pt will demonstrate understanding of memory  compensations and ways to keep thinking skills sharp Baseline:  Goal status: IN PROGRESS  4.  Pt will demonstrate improved UE functional use for ADLs as evidenced by increasing box/ blocks score by 4 blocks with RUE/LUE Baseline: R: 52, L: 50 Goal status: IN PROGRESS  5.  Pt will demonstrate improved ease with fastening buttons as evidenced by decreasing 3 button/ unbutton time to <30 seconds Baseline: 37.4 sec Goal status: IN PROGRESS - 32.9 sec on 06/05/23   ASSESSMENT:  CLINICAL IMPRESSION: Pt still with decreased motor control with GMC and FMC tasks, therefore continue to recommend engaging in HEP for shoulder and use of modifications to increase grasp and success with [facility] tasks.  Pt with no c/o pain in R arm this session, however reporting stretch with forward and overhead reaching.  Pt motivated to continue to focus on coordination.  PERFORMANCE DEFICITS: in functional skills including ADLs, IADLs, coordination, tone, ROM, strength, flexibility, Fine motor control, Gross motor control, balance, body mechanics, endurance, decreased knowledge of precautions, decreased knowledge  of use of DME, and UE functional use, cognitive skills including memory, problem solving, and safety awareness, and psychosocial skills including coping strategies and environmental adaptation.   IMPAIRMENTS: are limiting patient from ADLs and IADLs.      PLAN:  OT FREQUENCY: 2x/week  OT DURATION: 8 weeks  PLANNED INTERVENTIONS: self care/ADL training, therapeutic exercise, therapeutic activity, neuromuscular re-education, passive range of motion, functional mobility training, ultrasound, compression bandaging, moist heat, cryotherapy, patient/family education, cognitive remediation/compensation, psychosocial skills training, energy conservation, coping strategies training, and DME and/or AE instructions  RECOMMENDED OTHER SERVICES: NA  CONSULTED AND AGREED WITH PLAN OF CARE: Patient  PLAN FOR NEXT  SESSION: review coordination HEP, attempt ball toss/rotation, review grip/release pattern for motor control, review compensations for medication (memory, coordination)   Shamyia Grandpre, OTR/L 06/05/2023, 10:22 AM

## 2023-06-05 NOTE — Patient Instructions (Signed)
Coordination Exercises  Perform the following exercises for 10-15 minutes 1-2 times per day. Perform with both hand(s). Perform using big movements.  Flipping Cards: Place deck of cards on the table. Flip cards over by opening your hand big to grasp and then turn your palm up big. Deal cards: Hold 1/2 or whole deck in your hand. Use thumb to push card off top of deck with one big push. Flip card between each finger.  Rotate 2 golf balls in your hand: Both directions.  Pick up coins and place in coin bank or container: Pick up with big, intentional movements. Do not drag coin to the edge. Pick up coins and stack one at a time: Pick up with big, intentional movements. Do not drag coin to the edge. (5-10 in a stack) Pick up 5-10 coins one at a time and hold in palm. Then, move coins from palm to fingertips one at time and place in coin bank/container. Pick up coins and rotate in finger tips.  Fasten nuts/bolts or put on bottle caps: Turn as much/as big as you can with each turn.

## 2023-06-06 ENCOUNTER — Encounter: Payer: Self-pay | Admitting: Psychiatry

## 2023-06-06 ENCOUNTER — Ambulatory Visit (INDEPENDENT_AMBULATORY_CARE_PROVIDER_SITE_OTHER): Payer: Medicare Other | Admitting: Psychiatry

## 2023-06-06 DIAGNOSIS — F419 Anxiety disorder, unspecified: Secondary | ICD-10-CM | POA: Diagnosis not present

## 2023-06-06 DIAGNOSIS — F330 Major depressive disorder, recurrent, mild: Secondary | ICD-10-CM

## 2023-06-06 NOTE — Progress Notes (Signed)
Andrea Burke [id number] 09-30-1946 77 y.o.  Subjective:   Patient ID:  Andrea Burke is a 77 y.o. (DOB 04-Feb-1946) female.  Chief Complaint:  Chief Complaint  Patient presents with   Anxiety   Follow-up    Depression    HPI Andrea Burke presents to the office today for follow-up of depression, anxiety, and insomnia. She reports that she it, "pretty good" and approaching things one step at a time. She reports that she has been adjusting to diagnosis of Parkinson's. She is receiving PT and OT.  She reports that depression has been "ok."   Husband has been diagnosed with cancer. They met with his provider today and they think he has Stage I cancer and will have surgery next month. She has had some worry about him. She reports some sadness in response to them talking about getting their affairs in order. She reports that she falls asleep easily. Reports awakening once to use the bathroom and otherwise sleeps throughout the night. She reports taking at least 1 nap daily. Denies falling asleep in the middle of an activity. She reports that her energy is "fair." Motivation is "fair- think about all the things that need to be done." Appetite has been decreased. She reports difficulty with concentration. Denies SI.    Past medication trials: Remeron- vivid dreams, increased appetite Lexapro-nightmares Sertraline-agitation, insomnia, impaired concentration Paxil-intolerable side effects Fluoxetine-intolerable side effects Viibryd- Partial improvement on 20 mg. Does not want to take doses above 20 mg. Cymbalta Wellbutrin- Severe tremors Lithium Hydroxyzine- has taken for itching.  Xanax Deplin Gabapentin- prescribed for pain  Mini-Mental    Flowsheet Row Office Visit from 11/18/2020 in [facility] Office Visit from 04/25/2020 in [facility]  Total Score (max 30 points ) 29 29      Flowsheet Row ED from 02/26/2023 in  [facility] Emergency Department at [facility]  C-SSRS RISK CATEGORY No Risk        Review of Systems:  Review of Systems  Musculoskeletal:  Negative for gait problem.  Neurological:        Reports improved tremor. She reports that she is better able to speak since start of Sinemet  Psychiatric/Behavioral:         Please refer to HPI    Medications: I have reviewed the patient's current medications.  Current Outpatient Medications  Medication Sig Dispense Refill   Calcium Carbonate-Vitamin D (CALTRATE 600+D PO) Take by mouth.     carbidopa-levodopa (SINEMET IR) 25-100 MG tablet Take 1 tablet by mouth 3 (three) times daily. 10am/2pm/6pm 270 tablet 1   clobetasol cream (TEMOVATE) 0.05 % Apply topically.     Clobetasol Propionate (TEMOVATE) 0.05 % external spray Apply to scalp twice daily x 2 wk, then daily x 2 wk, then every other day x 2 wk. Not to face.     DULoxetine (CYMBALTA) 30 MG capsule TAKE 1 CAPSULE DAILY 90 capsule 1   finasteride (PROSCAR) 5 MG tablet Take 5 mg by mouth daily.     fish oil-omega-3 fatty acids 1000 MG capsule Take 2 g by mouth daily.     hydroxychloroquine (PLAQUENIL) 200 MG tablet Take by mouth daily.     Multiple Vitamins-Minerals (CENTRUM SILVER 50+WOMEN) TABS Take Daily     No current facility-administered medications for this visit.    Medication Side Effects: None  Allergies:  Allergies  Allergen Reactions   Doxycycline Calcium    Penicillins  Past Medical History:  Diagnosis Date   Cancer (HCC)    KIDNEY   Discoid lupus    Endometriosis    Headache(784.0)    Insomnia    Kidney disease    Lupus (HCC)    Osteopenia    Urinary incontinence     Past Medical History, Surgical history, Social history, and Family history were reviewed and updated as appropriate.   Please see review of systems for further details on the patient's review from today.   Objective:   Physical Exam:  There were no vitals taken for this  visit.  Physical Exam Constitutional:      General: She is not in acute distress. Musculoskeletal:        General: No deformity.  Neurological:     Mental Status: She is alert and oriented to person, place, and time.     Coordination: Coordination normal.  Psychiatric:        Attention and Perception: Attention and perception normal. She does not perceive auditory or visual hallucinations.        Mood and Affect: Mood normal. Mood is not anxious or depressed. Affect is not labile, blunt, angry or inappropriate.        Speech: Speech normal.        Behavior: Behavior normal.        Thought Content: Thought content normal. Thought content is not paranoid or delusional. Thought content does not include homicidal or suicidal ideation. Thought content does not include homicidal or suicidal plan.        Cognition and Memory: Cognition and memory normal.        Judgment: Judgment normal.     Comments: Insight intact     Lab Review:     Component Value Date/Time   NA 132 (L) 02/26/2023 0515   K 4.0 02/26/2023 0515   CL 103 02/26/2023 0515   CO2 22 02/26/2023 0515   GLUCOSE 127 (H) 02/26/2023 0515   BUN 14 02/26/2023 0515   CREATININE 0.81 02/26/2023 0515   CREATININE 0.72 07/31/2014 1222   CALCIUM 9.1 02/26/2023 0515   PROT 7.5 07/31/2014 1222   ALBUMIN 4.3 07/31/2014 1222   AST 21 07/31/2014 1222   ALT 17 07/31/2014 1222   ALKPHOS 54 07/31/2014 1222   BILITOT 0.6 07/31/2014 1222   GFRNONAA >60 02/26/2023 0515       Component Value Date/Time   WBC 3.6 (L) 02/26/2023 0515   RBC 4.66 02/26/2023 0515   HGB 13.2 02/26/2023 0515   HCT 39.9 02/26/2023 0515   PLT 169 02/26/2023 0515   MCV 85.6 02/26/2023 0515   MCV 86.6 07/31/2014 1222   MCH 28.3 02/26/2023 0515   MCHC 33.1 02/26/2023 0515   RDW 12.7 02/26/2023 0515   LYMPHSABS 1,887 09/29/2018 0951   EOSABS 122 09/29/2018 0951   BASOSABS 30 09/29/2018 0951    No results found for: "POCLITH", "LITHIUM"   No results  found for: "PHENYTOIN", "PHENOBARB", "VALPROATE", "CBMZ"   .res Assessment: Plan:    31 minutes spent dedicated to the care of this patient on the date of this encounter to include pre-visit review of records, ordering of medication, post visit documentation, and face-to-face time with the patient discussing how recent stressors have affected her mood and anxiety. She reports that she has had some anxiety and sadness in response to husband recently being diagnosed with cancer. She reports that she had some catastrophic thoughts after she was first diagnosed with Parkinson's disease and that  these worries have abated some. Will continue Duloxetine 30 mg daily for anxiety and depression.  She has an appointment next week with Rockne Menghini, LCSW.  Patient advised to contact office with any questions, adverse effects, or acute worsening in signs and symptoms. Pt to follow-up with this provider in 8 weeks or sooner if clinically indicated.     Acelin was seen today for anxiety and follow-up.  Diagnoses and all orders for this visit:  Anxiety disorder, unspecified type  Mild episode of recurrent major depressive disorder (HCC)     Please see After Visit Summary for patient specific instructions.  Future Appointments  Date Time Provider Department Center  06/12/2023  2:00 PM Maryruth Bun, PT [facility]  06/12/2023  2:45 PM Kathline Magic Brand Males, OT [facility]  06/13/2023  4:00 PM Mathis Fare, LCSW [facility] None  06/17/2023 11:45 AM Dillard Essex, OT [facility]  06/19/2023  3:30 PM Dillard Essex, OT [facility]  06/24/2023 11:00 AM Dillard Essex, OT [facility]  06/26/2023 11:00 AM Dillard Essex, OT [facility]  07/25/2023  3:30 PM Tat, Octaviano Batty, DO [facility] None  07/31/2023  1:15 PM Corie Chiquito, PMHNP [facility] None    No orders of the defined types were placed in this encounter.   -------------------------------

## 2023-06-11 NOTE — Therapy (Signed)
OUTPATIENT PHYSICAL THERAPY NEURO DISCHARGE SUMMARY   Patient Name: Andrea Burke MRN: [id number] DOB:August 12, 1946, 77 y.o., female Today's Date: 06/12/2023   PCP: Catha Gosselin, MD REFERRING PROVIDER: Vladimir Faster, DO    Progress Note Reporting Period 06/05/23 to 06/12/23  See note below for Objective Data and Assessment of Progress/Goals.      END OF SESSION:  PT End of Session - 06/12/23 1439     Visit Number 12    Number of Visits 16    Date for PT Re-Evaluation 06/14/23    Authorization Type Medicare/BCBS    Progress Note Due on Visit 10    PT Start Time 1405    PT Stop Time 1438    PT Time Calculation (min) 33 min    Equipment Utilized During Treatment Gait belt    Activity Tolerance Patient tolerated treatment well    Behavior During Therapy WFL for tasks assessed/performed                        Past Medical History:  Diagnosis Date   Cancer (HCC)    KIDNEY   Discoid lupus    Endometriosis    Headache(784.0)    Insomnia    Kidney disease    Lupus (HCC)    Osteopenia    Urinary incontinence    Past Surgical History:  Procedure Laterality Date   CATARACT EXTRACTION, BILATERAL     PELVIC LAPAROSCOPY     DIAG LASER LAP-ENDOMETRIOSIS   REMOVAL OF LEFT KIDNEY  10/08/2006   Patient Active Problem List   Diagnosis Date Noted   Major depressive disorder, single episode, moderate (HCC) 08/06/2018   Moderate major depression (HCC) 06/23/2018   Anxiety disorder 06/23/2018   Pruritus 04/01/2017   Onychomycosis 05/12/2012   Lentigines 05/12/2012   Discoid lupus    Cancer (HCC)    Headache(784.0)    Insomnia    Endometriosis    Osteopenia     ONSET DATE: 04/17/2023 (MD referral)  REFERRING DIAG: G20.A1 (ICD-10-CM) - Parkinson's disease without dyskinesia or fluctuating manifestations   THERAPY DIAG:  Other symptoms and signs involving the nervous system  Muscle weakness (generalized)  Unsteadiness on feet  Rationale for  Evaluation and Treatment: Rehabilitation  SUBJECTIVE:                                                                                                                                                                                             SUBJECTIVE STATEMENT: Ready to wrap up with PT but I hate to see it end.    Pt accompanied by: self  PERTINENT HISTORY: recent dx of PD, memory loss, depression  PAIN:  Are you having pain? No   PRECAUTIONS: Fall  RED FLAGS: None   WEIGHT BEARING RESTRICTIONS: No  FALLS: Has patient fallen in last 6 months? No  LIVING ENVIRONMENT: Lives with: lives with their spouse Lives in: House/apartment Stairs: Yes: External: 2 steps; on left going up Has following equipment at home: None  PLOF: Independent  Enjoys volunteering at [employer]; enjoys travelling.  PATIENT GOALS: Want to do my activities without falling and continue to travel  OBJECTIVE:     TODAY'S TREATMENT: 06/12/23 Activity Comments  5xSTS 11.6 sec without UEs   TUG 10.14 sec   2M walk 8.73 sec (3.77 ft/sec)  Reviewed HEP thoroughly and added examples of HEP progression (eyes closed, addition on weights) Pt tolerated well            Access Code: [email or web address] URL: [email or web address] Date: 06/05/2023 Prepared by: [facility] - Outpatient  Rehab - [facility]  Exercises - Sit to Stand Without Arm Support  - 1 x daily - 5 x weekly - 2 sets - 10 reps - Backward Walking with Counter Support  - 1 x daily - 5 x weekly - 1 sets - 3-5 reps - Side Stepping with Counter Support  - 1 x daily - 5 x weekly - 1 sets - 3-5 reps - Alternating Step forward-Balance Reaction  - 1 x daily - 5 x weekly - 1 sets - 10 reps - Alternating Step Backward with Support  - 1 x daily - 5 x weekly - 1 sets - 10 reps  PATIENT EDUCATION: Education details: edu on benefits of exercise for PD, clarification of directions for PWR moves class, edu on PD screens in 6 months, review and  progression of HEP Person educated: Patient Education method: Explanation, Demonstration, Tactile cues, and Verbal cues Education comprehension: verbalized understanding and returned demonstration     Below measures were taken at time of initial evaluation unless otherwise specified:   DIAGNOSTIC FINDINGS: NA for this episode  COGNITION: Overall cognitive status: Within functional limits for tasks assessed and reports fear and embarrassment in regards to tremors   SENSATION: WFL  MUSCLE TONE: RLE: Mild   POSTURE: rounded shoulders, forward head, and holds RUE in shoulder internal rotation, elbow flexion, fisted hand (able to extend and open up with cues)  LOWER EXTREMITY ROM:   WFL BLEs in sitting   LOWER EXTREMITY MMT:    MMT Right Eval Left Eval  Hip flexion 4 4  Hip extension    Hip abduction    Hip adduction    Hip internal rotation    Hip external rotation    Knee flexion 4 4  Knee extension 4+ 4  Ankle dorsiflexion 4+ 4  Ankle plantarflexion    Ankle inversion    Ankle eversion    (Blank rows = not tested)   TRANSFERS: Assistive device utilized: None  Sit to stand: SBA Stand to sit: SBA STAIRS: Level of Assistance: Modified independence Stair Negotiation Technique: Alternating Pattern  with Single Rail on Right Number of Stairs: 2-3  Height of Stairs: 4-6"    GAIT: Gait pattern:  holds RUE with LUE to prevent increased tremors, step through pattern, decreased arm swing- Left, decreased step length- Right, decreased trunk rotation, and narrow BOS Distance walked: 50 ft x 4 reps Assistive device utilized: None Level of assistance: SBA   FUNCTIONAL TESTS:  5 times sit to stand: 20.78 sec with  arms crossed at ches Timed up and go (TUG): 12.31 sec Dynamic Gait Index: 14/24 TUG cognitive:11.32 sec (slowed counting) 40M walk: 10.12 sec (3.24 ft/sec)       TODAY'S TREATMENT:                                                                                                                               DATE: 04/22/2023    PATIENT EDUCATION: Education details: Eval results, POC, rationale for OT to address RUE tremor and bradykinesia; seated PWR! UP initiated for HEP Person educated: Patient and Spouse Education method: Explanation, Demonstration, and Handouts Education comprehension: verbalized understanding, returned demonstration, and needs further education  HOME EXERCISE PROGRAM: Initiated seated PWR! Up with handouts-perform 1-2x/daily, 5-10 reps  GOALS: Goals reviewed with patient? Yes  SHORT TERM GOALS: Target date: 05/17/2023  Pt will be independent with HEP for improved balance, strength, gait. Baseline: Goal status: MET 05/06/23  2.  Pt will improve 5x sit<>stand to less than or equal to 15 sec to demonstrate improved functional strength and transfer efficiency. Baseline: 20.78 sec>10.78 sec 06/03/2023 Goal status: MET 06/03/2023  3.  Pt will verbalize understanding of local Parkinson's disease community resources.  Baseline: newly dx with PD, July 2024; provided 05/02/23 Goal status: MET 05/02/23   LONG TERM GOALS: Target date: 06/14/2023  Pt will be independent with HEP for improved balance, strength, gait. Baseline:  Goal status: MET 06/12/23  2.  Pt will improve 5x sit<>stand to less than or equal to 12.5 sec to demonstrate improved functional strength and transfer efficiency. Baseline: 20.78 sec; 11.6 sec 06/12/23 Goal status: MET 06/12/23  3.  Pt will improve DGI score to at least 19/24 to decrease fall risk. Baseline: 14/24>19/24 06/03/2023 Goal status: MET, 06/03/2023  4.  Pt will verbalize plans for continued community fitness upon d/c from PT to maximize gains made in PT. Baseline: plans to join PWR! Moves [place] class Goal status:MET, 06/05/2023  ASSESSMENT:  CLINICAL IMPRESSION: Patient arrived to session without complaints. Patient completed 5xSTS in decreased time compared to initial assessment  and this goals has been met. HEP was reviewed for max understanding and benefit. Patient has made good progress towards goals and is now ready for DC with transition to HEP and PWR! Moves classes.  OBJECTIVE IMPAIRMENTS: Abnormal gait, decreased balance, decreased mobility, difficulty walking, decreased strength, impaired tone, and postural dysfunction.   ACTIVITY LIMITATIONS: carrying, bending, standing, transfers, reach over head, and locomotion level  PARTICIPATION LIMITATIONS: meal prep, cleaning, laundry, driving, shopping, and community activity  PERSONAL FACTORS: 3+ comorbidities: see above; pt newly diagnosed with PD < 1 week ago  are also affecting patient's functional outcome.   REHAB POTENTIAL: Good  CLINICAL DECISION MAKING: Evolving/moderate complexity  EVALUATION COMPLEXITY: Moderate  PLAN:  PT FREQUENCY: 2x/week  PT DURATION: 8 weeks, including eval week  PLANNED INTERVENTIONS: Therapeutic exercises, Therapeutic activity, Neuromuscular re-education, Balance training, Gait training, Patient/Family education, Self Care, and DME  instructions  PLAN FOR NEXT SESSION: DC at this time    PHYSICAL THERAPY DISCHARGE SUMMARY  Visits from Start of Care: 12  Current functional level related to goals / functional outcomes: See above clinical impression   Remaining deficits: none   Education / Equipment: HEP  Plan: Patient agrees to discharge.  Patient goals were met. Patient is being discharged due to meeting the stated rehab goals.       Anette Guarneri, PT, DPT 06/12/23 2:41 PM  [facility] at [facility] [address] [place], [address] [place], [place] [address] Phone # [phone] Fax # [phone]

## 2023-06-12 ENCOUNTER — Ambulatory Visit: Payer: Medicare Other | Attending: Neurology | Admitting: Physical Therapy

## 2023-06-12 ENCOUNTER — Ambulatory Visit: Payer: Medicare Other | Admitting: Occupational Therapy

## 2023-06-12 ENCOUNTER — Encounter: Payer: Self-pay | Admitting: Physical Therapy

## 2023-06-12 DIAGNOSIS — M25611 Stiffness of right shoulder, not elsewhere classified: Secondary | ICD-10-CM | POA: Insufficient documentation

## 2023-06-12 DIAGNOSIS — R278 Other lack of coordination: Secondary | ICD-10-CM | POA: Diagnosis not present

## 2023-06-12 DIAGNOSIS — M6281 Muscle weakness (generalized): Secondary | ICD-10-CM | POA: Diagnosis not present

## 2023-06-12 DIAGNOSIS — R29818 Other symptoms and signs involving the nervous system: Secondary | ICD-10-CM

## 2023-06-12 DIAGNOSIS — R2681 Unsteadiness on feet: Secondary | ICD-10-CM | POA: Diagnosis not present

## 2023-06-12 NOTE — Therapy (Signed)
OUTPATIENT OCCUPATIONAL THERAPY PARKINSON'S  Treatment Note  Patient Name: Andrea Burke MRN: [id number] DOB:April 07, 1946, 77 y.o., female Today's Date: 06/12/2023  PCP: Catha Gosselin, MD REFERRING PROVIDER: Vladimir Faster, DO  END OF SESSION:          Past Medical History:  Diagnosis Date   Cancer (HCC)    KIDNEY   Discoid lupus    Endometriosis    Headache(784.0)    Insomnia    Kidney disease    Lupus (HCC)    Osteopenia    Urinary incontinence    Past Surgical History:  Procedure Laterality Date   CATARACT EXTRACTION, BILATERAL     PELVIC LAPAROSCOPY     DIAG LASER LAP-ENDOMETRIOSIS   REMOVAL OF LEFT KIDNEY  10/08/2006   Patient Active Problem List   Diagnosis Date Noted   Major depressive disorder, single episode, moderate (HCC) 08/06/2018   Moderate major depression (HCC) 06/23/2018   Anxiety disorder 06/23/2018   Pruritus 04/01/2017   Onychomycosis 05/12/2012   Lentigines 05/12/2012   Discoid lupus    Cancer (HCC)    Headache(784.0)    Insomnia    Endometriosis    Osteopenia     ONSET DATE: referral 04/26/23 (recent PD diagnosis)  REFERRING DIAG: G20.A1 (ICD-10-CM) - Parkinson's disease without dyskinesia or fluctuating manifestations  THERAPY DIAG:  No diagnosis found.  Rationale for Evaluation and Treatment: Rehabilitation  SUBJECTIVE:   SUBJECTIVE STATEMENT: Pt reports that sometimes she notices difficulty with using the mouse when on the computer.  She has tried using her large amplitude hand exercises and is utilizing stability from other hand as needed.   Pt accompanied by: self  PERTINENT HISTORY: recent dx of PD, memory loss, depression  PRECAUTIONS: Fall, back pain/muscle spasms  WEIGHT BEARING RESTRICTIONS: No  PAIN:  Are you having pain? Yes, reports "little bit" of tightness in back, unrated.  Performed stretches with PT and reports back is a little looser.   FALLS: Has patient fallen in last 6 months? No  LIVING  ENVIRONMENT: Lives with: lives with their spouse Lives in: House/apartment Stairs: Yes: External: 2-3 steps; on left going up Has following equipment at home: Grab bars  PLOF: Independent  Enjoys volunteering at [employer]; enjoys travelling.  PATIENT GOALS: control the arm/hand tremors with hand use  OBJECTIVE:   HAND DOMINANCE: Right  ADLs: Overall ADLs: Increased time, difficulty with clothing fasteners Transfers/ambulation related to ADLs: Independent Eating: increased spilling and dropping of foods UB Dressing: difficulty with buttons and other clothing fasteners LB Dressing: increased time and difficulty with clothing fasteners Toileting: occasional need to pull self up Bathing: Independent Tub Shower transfers: Mod I, holding on to door and grab bar when stepping in/out of shower  IADLs: Light housekeeping: increased tremors impacting washing dishes, folding laundry Meal Prep: completes with increased time/effort Community mobility: driving Medication management: difficulty with coordination, dropping pills occasionally.  Does keep a log to ensure recall of when taking medications Handwriting: 100% legible and PPT #1 (whales live in a [place]): 14.72 sec, reports handwriting used to be more rounded and occasionally "arm jerks" when writing.  MOBILITY STATUS: Independent  POSTURE COMMENTS:  rounded shoulders and forward head  ACTIVITY TOLERANCE: Activity tolerance: WFL for tasks assessed on eval  FUNCTIONAL OUTCOME MEASURES: 06/05/23 Standing functional reach: Right: 7.5 inches; Left: 7.5 inches Fastening/unfastening 3 buttons: 32.90 sec  EVAL Standing functional reach: Right: 3 inches; Left: 3 inches Fastening/unfastening 3 buttons: 37.40 sec Physical performance test: PPT#2 (simulated eating)  14.12 sec & PPT#4 (donning/doffing jacket): 13.25 sec  COORDINATION: 06/05/23 Box and blocks: Right: 48 blocks, Left: 50 blocks  9 Hole Peg test: Right: 24.0 sec;  Left: 26.62 sec Box and Blocks:  Right 52 blocks, Left 50 blocks Tremors: Resting, action, and Right  UE ROM:   decreased shoulder flexion bilaterally, however WFL;  hold RUE in shoulder internal rotation, decreased internal rotation bilaterally, limited to reaching to top of hips not able to reach mid back.  SENSATION: Reports intermittent light tingling in R hand  MUSCLE TONE: RUE: Mild and Hypertonic  COGNITION: Overall cognitive status: Within functional limits for tasks assessed   TODAY'S TREATMENT:                                                                      DATE:  06/12/23 Large amplitude: engaged in reaching into cabinet with RUE to retrieve cups and place onto counter at R.  OT providing demonstrating and cues for full hand grasp on cup and to incorporate gentle squeeze and release during grasp to keep activity more dynamic and reduce tremor.  Noted increased tremor when placing cups back into overhead cabinet. Handwriting: engaged in writing grocery list with lightly weighted pen with focus on increased legibility.  Pt incorporating modified grip strength with intermittent squeezing to decrease impact of tremors on handwriting.  Reviewed recommendations for printing, modification of hand placement and sequencing of spacing if writing in cursive, and to incorporate rest breaks as needed as noted decreasing legibility with fatigue. Coordination: engaged in picking up coins one at a time progressing to in-hand manipulation and translation of coins from palm to finger tips to place coins in container and then coin slot with focus on intentional movements.     06/05/23 Dynamic standing: engaged in forward reaching and reaching outside BOS to facilitate increased reach.  Pt demonstrating improved shoulder flexion and forward reach, reporting stretch through RUE but no pain.  Completed bilaterally for increased reach and motor control.  OT educating on functional carryover to putting  away dishes or retrieving clothing from front loader washer/dryer.   Coordination: Box and blocks: noted pt with decreased shoulder flexion and guarding with RUE. Button/unbutton: mild improvements in time.  OT educated on full grasp onto button and "push and pull" method when fastening and unfastening buttons.  Pt returning demonstration, will still benefit from additional practice. Provided pt with coordination handout and verbal instructions, however due to time constraints will need to review next session.   06/03/23 Pill box assessment: Completed in 5:05 with no errors.  Pt required increased time due to looking back through each pill bottle to ensure completing each prescription.   Medication management: OT then provided education on various strategies to increase recall with sequencing by organizing pill bottles beforehand and moving to other side of table or flipping over.  OT also educating on use of wash cloth/towel to decrease bouncing of pills if/when she drops them to decrease dropping pills to floor.  Encouraged pt to have a consistent routine with meds and provided recommendations for various styles of pill boxes to aid in organization and recall.   Memory: provided pt with memory compensation strategies and discussed various ones that may be most beneficial for  pt with focus on routine, use of calendar/lists/reminders.  Provided pt with handout. UE ROM: reviewed internal/external shoulder rotation with and without use of dowel, as pt did not complete any of these since last visit.  OT providing demonstration and cues for functional carryover, recommending placement of wash cloth under arm to maintain appropriate positioning during external rotation.     PATIENT EDUCATION: Education details: ongoing condition specific education Person educated: Patient Education method: Explanation Education comprehension: verbalized understanding and needs further education  HOME EXERCISE  PROGRAM: Large amplitude exercises - see pt instructions  Access Code: [email or web address] URL: [email or web address] Date: 05/09/2023 Prepared by: [facility] - Outpatient  Rehab - [facility]   GOALS: Goals reviewed with patient? Yes  SHORT TERM GOALS: Target date: 05/31/23  Pt will be independent with PD specific HEP. Baseline: Goal status: MET - 05/30/23  2.  Pt will verbalize understanding of task modifications/adaptive strategies and/or potential AE needs to increase ease, safety, and independence w/ ADLs/IADLs (as needed for medication management) Baseline:  Goal status: MET - 05/30/23  3.  Pt will verbalize understanding of ways to prevent future PD related complications and PD community resources. Baseline:  Goal status: MET - 05/30/23   LONG TERM GOALS: Target date: 06/28/23  Pt will write a short paragraph with 100% legibility and no significant decrease in letter size. Baseline:  Goal status: IN PROGRESS  2.  Pt will demonstrate ability to retrieve a lightweight object from moderate height self with increased trunk flexion, shoulder flexion, and elbow extension. Baseline: Standing functional reach: Right: 3 inches; Left: 3 inches Goal status: MET - standing functional reach 7.5 inches bilaterally on 06/05/23  3.  Pt will demonstrate understanding of memory compensations and ways to keep thinking skills sharp Baseline:  Goal status: IN PROGRESS  4.  Pt will demonstrate improved UE functional use for ADLs as evidenced by increasing box/ blocks score by 4 blocks with RUE/LUE Baseline: R: 52, L: 50 Goal status: IN PROGRESS  5.  Pt will demonstrate improved ease with fastening buttons as evidenced by decreasing 3 button/ unbutton time to <30 seconds Baseline: 37.4 sec Goal status: IN PROGRESS - 32.9 sec on 06/05/23   ASSESSMENT:  CLINICAL IMPRESSION: Pt still with decreased legibility with handwriting, especially with cursive increasing as she fatigues.  Pt  demonstrating good carryover of modifying grip/release with handwriting as well as grasping for cups to sustain grasp and decrease onset of tremors.  Pt with no c/o pain in R arm this session, even when reaching into overhead cabinet.  PERFORMANCE DEFICITS: in functional skills including ADLs, IADLs, coordination, tone, ROM, strength, flexibility, Fine motor control, Gross motor control, balance, body mechanics, endurance, decreased knowledge of precautions, decreased knowledge of use of DME, and UE functional use, cognitive skills including memory, problem solving, and safety awareness, and psychosocial skills including coping strategies and environmental adaptation.   IMPAIRMENTS: are limiting patient from ADLs and IADLs.      PLAN:  OT FREQUENCY: 2x/week  OT DURATION: 8 weeks  PLANNED INTERVENTIONS: self care/ADL training, therapeutic exercise, therapeutic activity, neuromuscular re-education, passive range of motion, functional mobility training, ultrasound, compression bandaging, moist heat, cryotherapy, patient/family education, cognitive remediation/compensation, psychosocial skills training, energy conservation, coping strategies training, and DME and/or AE instructions  RECOMMENDED OTHER SERVICES: NA  CONSULTED AND AGREED WITH PLAN OF CARE: Patient  PLAN FOR NEXT SESSION: review coordination HEP, attempt ball toss/rotation, review grip/release pattern for motor control, review compensations for medication (memory, coordination)   Traci Gafford,  Sahmya Arai, OTR/L 06/12/2023, 2:53 PM

## 2023-06-13 ENCOUNTER — Ambulatory Visit (INDEPENDENT_AMBULATORY_CARE_PROVIDER_SITE_OTHER): Payer: Medicare Other | Admitting: Psychiatry

## 2023-06-13 DIAGNOSIS — F419 Anxiety disorder, unspecified: Secondary | ICD-10-CM

## 2023-06-13 NOTE — Progress Notes (Signed)
[facility] Counselor/Therapist Progress Note  Patient ID: Andrea Burke, MRN: [id number],    Date: 06/13/2023  Time Spent: 45 minutes   Treatment Type: Individual Therapy  Reported Symptoms: anxiety, some depression, still working to accept her recent dx of Parkinson's; is following recommendations of her neurologist ; worries about the future   Mental Status Exam:  Appearance:   Neat     Behavior:  Appropriate, Sharing, and Motivated  Motor:  Normal  Speech/Language:   Clear and Coherent  Affect:  Depressed and anxious  Mood:  anxious and depressed  Thought process:  goal directed  Thought content:    Rumination  Sensory/Perceptual disturbances:    WNL  Orientation:  oriented to person, place, time/date, situation, day of week, month of year, year, and stated date of Sept. 5, 2024  Attention:  Fair  Concentration:  Fair  Memory:  Some short term memory issues and Dr is aware  Fund of knowledge:   Good and Fair  Insight:    Good and Fair  Judgment:   Good  Impulse Control:  Good   Risk Assessment: Danger to Self:  No Self-injurious Behavior: No Danger to Others: No Duty to Warn:no Physical Aggression / Violence:No  Access to Firearms a concern: No  Gang Involvement:No   Subjective: Patient in today and reporting anxiety, depression as she is trying to better accept her Parkinson's diagnosis, some worries into the future, and following up on the recommendations of her neurologist Dr. Arbutus Leas. Husband has been diagnosed with a cancer in groin area and to have surgery later this month. Sister found out she has breast cancer and her daughter had earlier found out she has breast CA. Wanted to talk about how to take care of herself in the midst of multiple personal and family stressors, and responded well to this. 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 Mood is some more stable today as she seems to be coping better with health challenges for she and her  husband. Her dog and cat died back during Covid and plans to eventually get another pet. Not as much tearfulness today and states she is "trying to not assume the worst". Smiling some today although still concerned  Interventions: Cognitive Behavioral Therapy and Ego-Supportive  Long-Term goal: (measurable)  Enhance the ability to handle effectively the full variety of life's anxieties. Patient will eventually rate herself as a "3 or under on a 1-10 scale for depression and for anxiety, for a period of at least 2 months."  Strategies:  Patient will work on re-framing thoughts that have been leading her to feel more anxious.  The re-framed thoughts will support more calmness and personal empowerment for patient.     Short Term goal:  Increase understanding of beliefs and messages that produce worry and anxiety. Strategies: Patient will work with therapist to gain more understanding of how anxiety and worry are supported by certain beliefs and messages.  She will be able to give at least 3 examples of such messages in an upcoming session.  Diagnosis:   ICD-10-CM   1. Anxiety disorder, unspecified type  F41.9       Plan:  Patient and today for session and reporting anxiety, fearful thinking, depression, all related to her recent diagnosis of Parkinson disease.  Needed session today to talk through her anxious feelings and feel heard.  Also anxious about her husband's recently diagnosed cancer as noted above.  Is planning to  get a second opinion on his situation.  Has good family support out of town and her mood did seem to be some better today although still very concerned and some intermittent tearfulness at times.  Has followed through on recommendations by her doctor and explained some upcoming appointments and activities in which she is going to be involved related to her Parkinson's.  Husband continues to be supportive of her but as she says, "he is not very expressive with it and he is always  been that way, but he cares about me".  Continues to try to avoid thinking in terms of "worst-case scenarios" and instead lean into the future following all medical advice she is given and accepting encouragement and support from those who love and care about her.  Towards end of session, she smiled and said that she hoped to get another dog sometime as her longtime dog recently passed away. Patient has made progress and needs to continue her work with goal-directed behaviors to move in a forward direction. Reminded and encouraged patient in her practice of positive and self affirming behaviors as noted in sessions including: Trusting her ability to follow through on recommendations per her neurologist regarding coping with Parkinson symptoms, positive self talk, trying to refrain from assuming worst-case scenarios, asking for help when she needs it, limiting her watching of distressing news on TV or online as this accentuates her anxiety, maintain contact with people who are supportive, stay on her prescribed medications, and recognize the strength she shows working with goal-directed behaviors to move in a direction that supports her improved emotional health.  Goal review and progress/challenges noted with patient.  Next appointment within 3 weeks.   Mathis Fare, LCSW

## 2023-06-17 ENCOUNTER — Ambulatory Visit: Payer: Medicare Other | Admitting: Occupational Therapy

## 2023-06-17 DIAGNOSIS — R29818 Other symptoms and signs involving the nervous system: Secondary | ICD-10-CM | POA: Diagnosis not present

## 2023-06-17 DIAGNOSIS — R278 Other lack of coordination: Secondary | ICD-10-CM | POA: Diagnosis not present

## 2023-06-17 DIAGNOSIS — R2681 Unsteadiness on feet: Secondary | ICD-10-CM | POA: Diagnosis not present

## 2023-06-17 DIAGNOSIS — M6281 Muscle weakness (generalized): Secondary | ICD-10-CM

## 2023-06-17 DIAGNOSIS — M25611 Stiffness of right shoulder, not elsewhere classified: Secondary | ICD-10-CM | POA: Diagnosis not present

## 2023-06-17 NOTE — Therapy (Signed)
OUTPATIENT OCCUPATIONAL THERAPY PARKINSON'S  Treatment Note  Patient Name: Andrea Burke MRN: [id number] DOB:10-25-1945, 77 y.o., female Today's Date: 06/17/2023  PCP: Catha Gosselin, MD REFERRING PROVIDER: Vladimir Faster, DO  END OF SESSION:  OT End of Session - 06/17/23 1152     Visit Number 9    Number of Visits 17    Date for OT Re-Evaluation 06/28/23    Authorization Type Medicare A&B    OT Start Time 1150    OT Stop Time 1230    OT Time Calculation (min) 40 min                    Past Medical History:  Diagnosis Date   Cancer (HCC)    KIDNEY   Discoid lupus    Endometriosis    Headache(784.0)    Insomnia    Kidney disease    Lupus (HCC)    Osteopenia    Urinary incontinence    Past Surgical History:  Procedure Laterality Date   CATARACT EXTRACTION, BILATERAL     PELVIC LAPAROSCOPY     DIAG LASER LAP-ENDOMETRIOSIS   REMOVAL OF LEFT KIDNEY  10/08/2006   Patient Active Problem List   Diagnosis Date Noted   Major depressive disorder, single episode, moderate (HCC) 08/06/2018   Moderate major depression (HCC) 06/23/2018   Anxiety disorder 06/23/2018   Pruritus 04/01/2017   Onychomycosis 05/12/2012   Lentigines 05/12/2012   Discoid lupus    Cancer (HCC)    Headache(784.0)    Insomnia    Endometriosis    Osteopenia     ONSET DATE: referral 04/26/23 (recent PD diagnosis)  REFERRING DIAG: G20.A1 (ICD-10-CM) - Parkinson's disease without dyskinesia or fluctuating manifestations  THERAPY DIAG:  Other symptoms and signs involving the nervous system  Muscle weakness (generalized)  Other lack of coordination  Stiffness of right shoulder, not elsewhere classified  Rationale for Evaluation and Treatment: Rehabilitation  SUBJECTIVE:   SUBJECTIVE STATEMENT: Pt reports having a smooth and quiet weekend. Pt accompanied by: self  PERTINENT HISTORY: recent dx of PD, memory loss, depression  PRECAUTIONS: Fall, back pain/muscle  spasms  WEIGHT BEARING RESTRICTIONS: No  PAIN:  Are you having pain? Yes, reports "little bit" of tightness in back, unrated.  Performed stretches with PT and reports back is a little looser.   FALLS: Has patient fallen in last 6 months? No  LIVING ENVIRONMENT: Lives with: lives with their spouse Lives in: House/apartment Stairs: Yes: External: 2-3 steps; on left going up Has following equipment at home: Grab bars  PLOF: Independent  Enjoys volunteering at [employer]; enjoys travelling.  PATIENT GOALS: control the arm/hand tremors with hand use  OBJECTIVE:   HAND DOMINANCE: Right  ADLs: Overall ADLs: Increased time, difficulty with clothing fasteners Transfers/ambulation related to ADLs: Independent Eating: increased spilling and dropping of foods UB Dressing: difficulty with buttons and other clothing fasteners LB Dressing: increased time and difficulty with clothing fasteners Toileting: occasional need to pull self up Bathing: Independent Tub Shower transfers: Mod I, holding on to door and grab bar when stepping in/out of shower  IADLs: Light housekeeping: increased tremors impacting washing dishes, folding laundry Meal Prep: completes with increased time/effort Community mobility: driving Medication management: difficulty with coordination, dropping pills occasionally.  Does keep a log to ensure recall of when taking medications Handwriting: 100% legible and PPT #1 (whales live in a blue ocean): 14.72 sec, reports handwriting used to be more rounded and occasionally "arm jerks" when writing.  MOBILITY STATUS: Independent  POSTURE COMMENTS:  rounded shoulders and forward head  ACTIVITY TOLERANCE: Activity tolerance: WFL for tasks assessed on eval  FUNCTIONAL OUTCOME MEASURES: 06/05/23 Standing functional reach: Right: 7.5 inches; Left: 7.5 inches Fastening/unfastening 3 buttons: 32.90 sec  EVAL Standing functional reach: Right: 3 inches; Left: 3  inches Fastening/unfastening 3 buttons: 37.40 sec Physical performance test: PPT#2 (simulated eating) 14.12 sec & PPT#4 (donning/doffing jacket): 13.25 sec  COORDINATION: 06/05/23 Box and blocks: Right: 48 blocks, Left: 50 blocks  9 Hole Peg test: Right: 24.0 sec; Left: 26.62 sec Box and Blocks:  Right 52 blocks, Left 50 blocks Tremors: Resting, action, and Right  UE ROM:   decreased shoulder flexion bilaterally, however WFL;  hold RUE in shoulder internal rotation, decreased internal rotation bilaterally, limited to reaching to top of hips not able to reach mid back.  SENSATION: Reports intermittent light tingling in R hand  MUSCLE TONE: RUE: Mild and Hypertonic  COGNITION: Overall cognitive status: Within functional limits for tasks assessed   TODAY'S TREATMENT:                                                                      DATE:  06/17/23 Large amplitude: engaged in large amplitude hands opening/closing, wrist flexion/extension, forearm supination/pronation, thumb opposition with elbows bent and extended.  OT providing demonstration and min cues for amplitude, noting decreased wrist flexion/extension and finger extension.  Engaged in forearm extension with elbows straight, wrists extended, and fingers open.  OT providing cues for seated posture for increased anterior weight shift during forearm extension.  Box and blocks: L: 52 blocks and R: 61 (pt bumping into barrier x2 with R) Buttons: 37.85 sec.  OT providing verbal cues and demonstration for "push and pull" when fastening and unfastening buttons.  Pt then completed after massed practice in 23.69 seconds.  OT reviewed strategy to aid in fastening button at wrist/cuff of sleeve.  OT reviewed carryover of translation with sliding cards off deck with thumb and transferring coins from palm to finger tips to one handed buttoning.      06/12/23 Large amplitude: engaged in reaching into cabinet with RUE to retrieve cups and place  onto counter at R.  OT providing demonstrating and cues for full hand grasp on cup and to incorporate gentle squeeze and release during grasp to keep activity more dynamic and reduce tremor.  Noted increased tremor when placing cups back into overhead cabinet. Handwriting: engaged in writing grocery list with lightly weighted pen with focus on increased legibility.  Pt incorporating modified grip strength with intermittent squeezing to decrease impact of tremors on handwriting.  Reviewed recommendations for printing, modification of hand placement and sequencing of spacing if writing in cursive, and to incorporate rest breaks as needed as noted decreasing legibility with fatigue. Coordination: engaged in picking up coins one at a time progressing to in-hand manipulation and translation of coins from palm to finger tips to place coins in container and then coin slot with focus on intentional movements.     06/05/23 Dynamic standing: engaged in forward reaching and reaching outside BOS to facilitate increased reach.  Pt demonstrating improved shoulder flexion and forward reach, reporting stretch through RUE but no pain.  Completed bilaterally for increased reach  and motor control.  OT educating on functional carryover to putting away dishes or retrieving clothing from front loader washer/dryer.   Coordination: Box and blocks: noted pt with decreased shoulder flexion and guarding with RUE. Button/unbutton: mild improvements in time.  OT educated on full grasp onto button and "push and pull" method when fastening and unfastening buttons.  Pt returning demonstration, will still benefit from additional practice. Provided pt with coordination handout and verbal instructions, however due to time constraints will need to review next session.   PATIENT EDUCATION: Education details: ongoing condition specific education Person educated: Patient Education method: Explanation Education comprehension: verbalized  understanding and needs further education  HOME EXERCISE PROGRAM: Large amplitude exercises - see pt instructions  Access Code: [email or web address] URL: [email or web address] Date: 05/09/2023 Prepared by: [facility] - [facility]   GOALS: Goals reviewed with patient? Yes  SHORT TERM GOALS: Target date: 05/31/23  Pt will be independent with PD specific HEP. Baseline: Goal status: MET - 05/30/23  2.  Pt will verbalize understanding of task modifications/adaptive strategies and/or potential AE needs to increase ease, safety, and independence w/ ADLs/IADLs (as needed for medication management) Baseline:  Goal status: MET - 05/30/23  3.  Pt will verbalize understanding of ways to prevent future PD related complications and PD community resources. Baseline:  Goal status: MET - 05/30/23   LONG TERM GOALS: Target date: 06/28/23  Pt will write a short paragraph with 100% legibility and no significant decrease in letter size. Baseline:  Goal status: IN PROGRESS  2.  Pt will demonstrate ability to retrieve a lightweight object from moderate height self with increased trunk flexion, shoulder flexion, and elbow extension. Baseline: Standing functional reach: Right: 3 inches; Left: 3 inches Goal status: MET - standing functional reach 7.5 inches bilaterally on 06/05/23  3.  Pt will demonstrate understanding of memory compensations and ways to keep thinking skills sharp Baseline:  Goal status: IN PROGRESS  4.  Pt will demonstrate improved UE functional use for ADLs as evidenced by increasing box/ blocks score by 4 blocks with RUE/LUE Baseline: R: 52, L: 50 Goal status: IN PROGRESS - partially met R: 61 and L: 52 on 06/17/23  5.  Pt will demonstrate improved ease with fastening buttons as evidenced by decreasing 3 button/ unbutton time to <30 seconds Baseline: 37.4 sec Goal status: IN PROGRESS - 32.9 sec on 06/05/23   ASSESSMENT:  CLINICAL IMPRESSION: Pt  demonstrating improved coordination this session post large amplitude exercises and massed practice.  Recommended continued engagement in HEP with focus on large amplitude and coordination tasks and educated on functional carryover of tasks.  PERFORMANCE DEFICITS: in functional skills including ADLs, IADLs, coordination, tone, ROM, strength, flexibility, Fine motor control, Gross motor control, balance, body mechanics, endurance, decreased knowledge of precautions, decreased knowledge of use of DME, and UE functional use, cognitive skills including memory, problem solving, and safety awareness, and psychosocial skills including coping strategies and environmental adaptation.   IMPAIRMENTS: are limiting patient from ADLs and IADLs.      PLAN:  OT FREQUENCY: 2x/week  OT DURATION: 8 weeks  PLANNED INTERVENTIONS: self care/ADL training, therapeutic exercise, therapeutic activity, neuromuscular re-education, passive range of motion, functional mobility training, ultrasound, compression bandaging, moist heat, cryotherapy, patient/family education, cognitive remediation/compensation, psychosocial skills training, energy conservation, coping strategies training, and DME and/or AE instructions  RECOMMENDED OTHER SERVICES: NA  CONSULTED AND AGREED WITH PLAN OF CARE: Patient  PLAN FOR NEXT SESSION: review coordination HEP, attempt ball  toss/rotation, review grip/release pattern for motor control, review compensations for medication (memory, coordination)   Cleopatra Sardo, OTR/L 06/17/2023, 11:52 AM

## 2023-06-19 ENCOUNTER — Ambulatory Visit: Payer: Medicare Other | Admitting: Occupational Therapy

## 2023-06-19 ENCOUNTER — Encounter: Payer: Medicare Other | Admitting: Occupational Therapy

## 2023-06-19 DIAGNOSIS — R29818 Other symptoms and signs involving the nervous system: Secondary | ICD-10-CM

## 2023-06-19 DIAGNOSIS — R278 Other lack of coordination: Secondary | ICD-10-CM | POA: Diagnosis not present

## 2023-06-19 DIAGNOSIS — M6281 Muscle weakness (generalized): Secondary | ICD-10-CM | POA: Diagnosis not present

## 2023-06-19 DIAGNOSIS — R2681 Unsteadiness on feet: Secondary | ICD-10-CM | POA: Diagnosis not present

## 2023-06-19 DIAGNOSIS — M25611 Stiffness of right shoulder, not elsewhere classified: Secondary | ICD-10-CM | POA: Diagnosis not present

## 2023-06-19 NOTE — Therapy (Signed)
OUTPATIENT OCCUPATIONAL THERAPY [facility]  Treatment Note & Progress Note  Patient Name: Andrea Burke MRN: [id number] DOB:12-27-1945, 77 y.o., female Today's Date: 06/19/2023  PCP: Catha Gosselin, MD REFERRING PROVIDER: Vladimir Faster, DO  Occupational Therapy Progress Note  Dates of Reporting Period: 05/02/23 to 06/19/23  Objective Reports of Subjective Statement: Pt is demonstrating improvements in fine and gross motor control movements, decrease in pain in R shoulder and improved ROM of RUE.  Pt will continue to benefit from OT services for further focus on memory, medication management, and review of modifications for quality of movement and quality of life.   END OF SESSION:  OT End of Session - 06/19/23 1555     Visit Number 10    Number of Visits 17    Date for OT Re-Evaluation 06/28/23    Authorization Type Medicare A&B    OT Start Time 1534    OT Stop Time 1616    OT Time Calculation (min) 42 min                     Past Medical History:  Diagnosis Date   Cancer (HCC)    KIDNEY   Discoid lupus    Endometriosis    Headache(784.0)    Insomnia    Kidney disease    Lupus (HCC)    Osteopenia    Urinary incontinence    Past Surgical History:  Procedure Laterality Date   CATARACT EXTRACTION, BILATERAL     PELVIC LAPAROSCOPY     DIAG LASER LAP-ENDOMETRIOSIS   REMOVAL OF LEFT KIDNEY  10/08/2006   Patient Active Problem List   Diagnosis Date Noted   Major depressive disorder, single episode, moderate (HCC) 08/06/2018   Moderate major depression (HCC) 06/23/2018   Anxiety disorder 06/23/2018   Pruritus 04/01/2017   Onychomycosis 05/12/2012   Lentigines 05/12/2012   Discoid lupus    Cancer (HCC)    Headache(784.0)    Insomnia    Endometriosis    Osteopenia     ONSET DATE: referral 04/26/23 (recent PD diagnosis)  REFERRING DIAG: G20.A1 (ICD-10-CM) - Parkinson's disease without dyskinesia or fluctuating manifestations  THERAPY DIAG:   Other symptoms and signs involving the nervous system  Muscle weakness (generalized)  Other lack of coordination  Stiffness of right shoulder, not elsewhere classified  Rationale for Evaluation and Treatment: Rehabilitation  SUBJECTIVE:   SUBJECTIVE STATEMENT: Pt reports volunteering at the hospital yesterday and went to Parkinson's exercise and education classes today. Pt accompanied by: self  PERTINENT HISTORY: recent dx of PD, memory loss, depression  PRECAUTIONS: Fall, back pain/muscle spasms  WEIGHT BEARING RESTRICTIONS: No  PAIN:  Are you having pain? Yes, reports "little bit" of tightness in back, unrated.  Performed stretches with PT and reports back is a little looser.   FALLS: Has patient fallen in last 6 months? No  LIVING ENVIRONMENT: Lives with: lives with their spouse Lives in: House/apartment Stairs: Yes: External: 2-3 steps; on left going up Has following equipment at home: Grab bars  PLOF: Independent  Enjoys volunteering at [employer]; enjoys travelling.  PATIENT GOALS: control the arm/hand tremors with hand use  OBJECTIVE:   HAND DOMINANCE: Right  ADLs: Overall ADLs: Increased time, difficulty with clothing fasteners Transfers/ambulation related to ADLs: Independent Eating: increased spilling and dropping of foods UB Dressing: difficulty with buttons and other clothing fasteners LB Dressing: increased time and difficulty with clothing fasteners Toileting: occasional need to pull self up Bathing: Independent Tub Shower transfers:  Mod I, holding on to door and grab bar when stepping in/out of shower  IADLs: Light housekeeping: increased tremors impacting washing dishes, folding laundry Meal Prep: completes with increased time/effort Community mobility: driving Medication management: difficulty with coordination, dropping pills occasionally.  Does keep a log to ensure recall of when taking medications Handwriting: 100% legible and PPT #1  (whales live in a blue ocean): 14.72 sec, reports handwriting used to be more rounded and occasionally "arm jerks" when writing.  MOBILITY STATUS: Independent  POSTURE COMMENTS:  rounded shoulders and forward head  ACTIVITY TOLERANCE: Activity tolerance: WFL for tasks assessed on eval  FUNCTIONAL OUTCOME MEASURES: 06/05/23 Standing functional reach: Right: 7.5 inches; Left: 7.5 inches Fastening/unfastening 3 buttons: 32.90 sec  EVAL Standing functional reach: Right: 3 inches; Left: 3 inches Fastening/unfastening 3 buttons: 37.40 sec Physical performance test: PPT#2 (simulated eating) 14.12 sec & PPT#4 (donning/doffing jacket): 13.25 sec  COORDINATION: 06/17/23 Box and blocks: R: 61 and L: 52  06/05/23 Box and blocks: Right: 48 blocks, Left: 50 blocks  9 Hole Peg test: Right: 24.0 sec; Left: 26.62 sec Box and Blocks:  Right 52 blocks, Left 50 blocks Tremors: Resting, action, and Right  UE ROM:   06/19/23: Improved internal rotation bilaterally (able to reach towards opposite hip behind back)  Eval:  decreased shoulder flexion bilaterally, however WFL;  hold RUE in shoulder internal rotation, decreased internal rotation bilaterally, limited to reaching to top of hips not able to reach mid back.   SENSATION: Reports intermittent light tingling in R hand  MUSCLE TONE: RUE: Mild and Hypertonic  COGNITION: Overall cognitive status: Within functional limits for tasks assessed   TODAY'S TREATMENT:                                                                      DATE:  06/19/23 Coordination: engaged in small peg board pattern replication with focus on [facility] and coordination progressing to in-hand manipulation and translation of pegs from palm to finger tips.  Pt with increased difficulty with translation - particularly maintaining pegs in hand while moving pegs to finger tips, however with repetition pt demonstrating improved motor control.  OT providing cues for large amplitude  as able when picking up and placing pegs.  Buttons: 26.25 sec Handwriting: Pt writing paragraph with improved legibility and no decrease in letter size this session.  Pt utilizing lightweight pen with small built up handle, therefore recommending pt obtain a pen with a handle/grip to decrease pressure when writing and allow for modified squeeze/release during writing.     06/17/23 Large amplitude: engaged in large amplitude hands opening/closing, wrist flexion/extension, forearm supination/pronation, thumb opposition with elbows bent and extended.  OT providing demonstration and min cues for amplitude, noting decreased wrist flexion/extension and finger extension.  Engaged in forearm extension with elbows straight, wrists extended, and fingers open.  OT providing cues for seated posture for increased anterior weight shift during forearm extension.  Box and blocks: L: 52 blocks and R: 61 (pt bumping into barrier x2 with R) Buttons: 37.85 sec.  OT providing verbal cues and demonstration for "push and pull" when fastening and unfastening buttons.  Pt then completed after massed practice in 23.69 seconds.  OT reviewed strategy to aid in fastening  button at wrist/cuff of sleeve.  OT reviewed carryover of translation with sliding cards off deck with thumb and transferring coins from palm to finger tips to one handed buttoning.      06/12/23 Large amplitude: engaged in reaching into cabinet with RUE to retrieve cups and place onto counter at R.  OT providing demonstrating and cues for full hand grasp on cup and to incorporate gentle squeeze and release during grasp to keep activity more dynamic and reduce tremor.  Noted increased tremor when placing cups back into overhead cabinet. Handwriting: engaged in writing grocery list with lightly weighted pen with focus on increased legibility.  Pt incorporating modified grip strength with intermittent squeezing to decrease impact of tremors on handwriting.  Reviewed  recommendations for printing, modification of hand placement and sequencing of spacing if writing in cursive, and to incorporate rest breaks as needed as noted decreasing legibility with fatigue. Coordination: engaged in picking up coins one at a time progressing to in-hand manipulation and translation of coins from palm to finger tips to place coins in container and then coin slot with focus on intentional movements.     PATIENT EDUCATION: Education details: ongoing condition specific education Person educated: Patient Education method: Explanation Education comprehension: verbalized understanding and needs further education  HOME EXERCISE PROGRAM: Large amplitude exercises - see pt instructions  Access Code: [email or web address] URL: [email or web address] Date: 05/09/2023 Prepared by: [facility] - Outpatient  Rehab - [facility]   GOALS: Goals reviewed with patient? Yes  SHORT TERM GOALS: Target date: 05/31/23  Pt will be independent with PD specific HEP. Baseline: Goal status: MET - 05/30/23  2.  Pt will verbalize understanding of task modifications/adaptive strategies and/or potential AE needs to increase ease, safety, and independence w/ ADLs/IADLs (as needed for medication management) Baseline:  Goal status: MET - 05/30/23  3.  Pt will verbalize understanding of ways to prevent future PD related complications and PD community resources. Baseline:  Goal status: MET - 05/30/23   LONG TERM GOALS: Target date: 06/28/23  Pt will write a short paragraph with 100% legibility and no significant decrease in letter size. Baseline:  Goal status: IN PROGRESS  2.  Pt will demonstrate ability to retrieve a lightweight object from moderate height self with increased trunk flexion, shoulder flexion, and elbow extension. Baseline: Standing functional reach: Right: 3 inches; Left: 3 inches Goal status: MET - standing functional reach 7.5 inches bilaterally on 06/05/23  3.  Pt will  demonstrate understanding of memory compensations and ways to keep thinking skills sharp Baseline:  Goal status: IN PROGRESS  4.  Pt will demonstrate improved UE functional use for ADLs as evidenced by increasing box/ blocks score by 4 blocks with RUE/LUE Baseline: R: 52, L: 50 Goal status: IN PROGRESS - partially met R: 61 and L: 52 on 06/17/23  5.  Pt will demonstrate improved ease with fastening buttons as evidenced by decreasing 3 button/ unbutton time to <30 seconds Baseline: 37.4 sec Goal status: MET - 26.25 on 06/19/23   ASSESSMENT:  CLINICAL IMPRESSION: Pt demonstrating improved coordination this session with buttons and pegs, still with difficulty with attempting to hold more than one item in hand and when translating finger tips <> palm.  Pt demonstrating improvements with handwriting size and legibility.  Reviewed recommendation for use of pen grip for writing.   PERFORMANCE DEFICITS: in functional skills including ADLs, IADLs, coordination, tone, ROM, strength, flexibility, Fine motor control, Gross motor control, balance, body mechanics, endurance, decreased knowledge of  precautions, decreased knowledge of use of DME, and UE functional use, cognitive skills including memory, problem solving, and safety awareness, and psychosocial skills including coping strategies and environmental adaptation.   IMPAIRMENTS: are limiting patient from ADLs and IADLs.      PLAN:  OT FREQUENCY: 2x/week  OT DURATION: 8 weeks  PLANNED INTERVENTIONS: self care/ADL training, therapeutic exercise, therapeutic activity, neuromuscular re-education, passive range of motion, functional mobility training, ultrasound, compression bandaging, moist heat, cryotherapy, patient/family education, cognitive remediation/compensation, psychosocial skills training, energy conservation, coping strategies training, and DME and/or AE instructions  RECOMMENDED OTHER SERVICES: NA  CONSULTED AND AGREED WITH PLAN OF  CARE: Patient  PLAN FOR NEXT SESSION: attempt ball toss/rotation, review grip/release pattern for motor control with reaching and handwriting, review compensations for medication (memory, coordination)   Ailea Rhatigan, OTR/L 06/19/2023, 4:25 PM

## 2023-06-24 ENCOUNTER — Ambulatory Visit: Payer: Medicare Other | Admitting: Occupational Therapy

## 2023-06-24 DIAGNOSIS — R29818 Other symptoms and signs involving the nervous system: Secondary | ICD-10-CM | POA: Diagnosis not present

## 2023-06-24 DIAGNOSIS — M25611 Stiffness of right shoulder, not elsewhere classified: Secondary | ICD-10-CM

## 2023-06-24 DIAGNOSIS — M6281 Muscle weakness (generalized): Secondary | ICD-10-CM

## 2023-06-24 DIAGNOSIS — R278 Other lack of coordination: Secondary | ICD-10-CM

## 2023-06-24 DIAGNOSIS — R2681 Unsteadiness on feet: Secondary | ICD-10-CM | POA: Diagnosis not present

## 2023-06-24 NOTE — Therapy (Signed)
OUTPATIENT OCCUPATIONAL THERAPY [facility]  Treatment Note   Patient Name: Andrea Burke MRN: [id number] DOB:[birth year], 77 y.o., female Today's Date: 06/24/2023  PCP: Catha Gosselin, MD REFERRING PROVIDER: Vladimir Faster, DO  END OF SESSION:  OT End of Session - 06/24/23 1142     Visit Number 11    Number of Visits 17    Date for OT Re-Evaluation 06/28/23    Authorization Type Medicare A&B    OT Start Time 1105    OT Stop Time 1145    OT Time Calculation (min) 40 min                      Past Medical History:  Diagnosis Date   Cancer (HCC)    KIDNEY   Discoid lupus    Endometriosis    Headache(784.0)    Insomnia    Kidney disease    Lupus (HCC)    Osteopenia    Urinary incontinence    Past Surgical History:  Procedure Laterality Date   CATARACT EXTRACTION, BILATERAL     PELVIC LAPAROSCOPY     DIAG LASER LAP-ENDOMETRIOSIS   REMOVAL OF LEFT KIDNEY  10/08/2006   Patient Active Problem List   Diagnosis Date Noted   Major depressive disorder, single episode, moderate (HCC) 08/06/2018   Moderate major depression (HCC) 06/23/2018   Anxiety disorder 06/23/2018   Pruritus 04/01/2017   Onychomycosis 05/12/2012   Lentigines 05/12/2012   Discoid lupus    Cancer (HCC)    Headache(784.0)    Insomnia    Endometriosis    Osteopenia     ONSET DATE: referral 04/26/23 (recent PD diagnosis)  REFERRING DIAG: G20.A1 (ICD-10-CM) - Parkinson's disease without dyskinesia or fluctuating manifestations  THERAPY DIAG:  Other symptoms and signs involving the nervous system  Muscle weakness (generalized)  Other lack of coordination  Stiffness of right shoulder, not elsewhere classified  Rationale for Evaluation and Treatment: Rehabilitation  SUBJECTIVE:   SUBJECTIVE STATEMENT: Pt reports doing yard work and fell of a small step ladder.  She reports sore in the back today. Pt accompanied by: self  PERTINENT HISTORY: recent dx of PD, memory loss,  depression  PRECAUTIONS: Fall, back pain/muscle spasms  WEIGHT BEARING RESTRICTIONS: No  PAIN:  Are you having pain? Yes, reports "little bit" of tightness in back, unrated.   FALLS: Has patient fallen in last 6 months? No  LIVING ENVIRONMENT: Lives with: lives with their spouse Lives in: House/apartment Stairs: Yes: External: 2-3 steps; on left going up Has following equipment at home: Grab bars  PLOF: Independent  Enjoys volunteering at [employer]; enjoys travelling.  PATIENT GOALS: control the arm/hand tremors with hand use  OBJECTIVE:   HAND DOMINANCE: Right  ADLs: Overall ADLs: Increased time, difficulty with clothing fasteners Transfers/ambulation related to ADLs: Independent Eating: increased spilling and dropping of foods UB Dressing: difficulty with buttons and other clothing fasteners LB Dressing: increased time and difficulty with clothing fasteners Toileting: occasional need to pull self up Bathing: Independent Tub Shower transfers: Mod I, holding on to door and grab bar when stepping in/out of shower  IADLs: Light housekeeping: increased tremors impacting washing dishes, folding laundry Meal Prep: completes with increased time/effort Community mobility: driving Medication management: difficulty with coordination, dropping pills occasionally.  Does keep a log to ensure recall of when taking medications Handwriting: 100% legible and PPT #1 (whales live in a blue ocean): 14.72 sec, reports handwriting used to be more rounded and occasionally "arm jerks"  when writing.  MOBILITY STATUS: Independent  POSTURE COMMENTS:  rounded shoulders and forward head  ACTIVITY TOLERANCE: Activity tolerance: WFL for tasks assessed on eval  FUNCTIONAL OUTCOME MEASURES: 06/05/23 Standing functional reach: Right: 7.5 inches; Left: 7.5 inches Fastening/unfastening 3 buttons: 32.90 sec  EVAL Standing functional reach: Right: 3 inches; Left: 3 inches Fastening/unfastening 3  buttons: 37.40 sec Physical performance test: PPT#2 (simulated eating) 14.12 sec & PPT#4 (donning/doffing jacket): 13.25 sec  COORDINATION: 06/17/23 Box and blocks: R: 61 and L: 52  06/05/23 Box and blocks: Right: 48 blocks, Left: 50 blocks  9 Hole Peg test: Right: 24.0 sec; Left: 26.62 sec Box and Blocks:  Right 52 blocks, Left 50 blocks Tremors: Resting, action, and Right  UE ROM:   06/19/23: Improved internal rotation bilaterally (able to reach towards opposite hip behind back)  Eval:  decreased shoulder flexion bilaterally, however WFL;  hold RUE in shoulder internal rotation, decreased internal rotation bilaterally, limited to reaching to top of hips not able to reach mid back.   SENSATION: Reports intermittent light tingling in R hand  MUSCLE TONE: RUE: Mild and Hypertonic  COGNITION: Overall cognitive status: Within functional limits for tasks assessed   TODAY'S TREATMENT:                                                                      DATE:  06/24/23 Medication: engaged in Pill box assessment with pt completing in 4:18 with no errors.  Pt did demonstrate good technique with sequentially administering and then double checking.  Pt reports that she double and triple checks herself when she administers meds at home.   Large amplitude: engaged in simulated ADLS for the following activities (using a plastic bag): donning shirt, drying back, pulling down shirt, pulling up socks and donning pants.  OT providing demonstration and verbal cues for improved technique.  Pt with most difficulty with tossing bag over back for simulated drying of back.  Also reports fatigue in back with reaching behind back and when reaching towards floor in simulated LB dressing.    06/19/23 Coordination: engaged in small peg board pattern replication with focus on [facility] and coordination progressing to in-hand manipulation and translation of pegs from palm to finger tips.  Pt with increased difficulty  with translation - particularly maintaining pegs in hand while moving pegs to finger tips, however with repetition pt demonstrating improved motor control.  OT providing cues for large amplitude as able when picking up and placing pegs.  Buttons: 26.25 sec Handwriting: Pt writing paragraph with improved legibility and no decrease in letter size this session.  Pt utilizing lightweight pen with small built up handle, therefore recommending pt obtain a pen with a handle/grip to decrease pressure when writing and allow for modified squeeze/release during writing.     06/17/23 Large amplitude: engaged in large amplitude hands opening/closing, wrist flexion/extension, forearm supination/pronation, thumb opposition with elbows bent and extended.  OT providing demonstration and min cues for amplitude, noting decreased wrist flexion/extension and finger extension.  Engaged in forearm extension with elbows straight, wrists extended, and fingers open.  OT providing cues for seated posture for increased anterior weight shift during forearm extension.  Box and blocks: L: 52 blocks and R: 61 (pt bumping into  barrier x2 with R) Buttons: 37.85 sec.  OT providing verbal cues and demonstration for "push and pull" when fastening and unfastening buttons.  Pt then completed after massed practice in 23.69 seconds.  OT reviewed strategy to aid in fastening button at wrist/cuff of sleeve.  OT reviewed carryover of translation with sliding cards off deck with thumb and transferring coins from palm to finger tips to one handed buttoning.     PATIENT EDUCATION: Education details: ongoing condition specific education Person educated: Patient Education method: Explanation Education comprehension: verbalized understanding and needs further education  HOME EXERCISE PROGRAM: Large amplitude exercises - see pt instructions  Access Code: [email or web address] URL: [email or web address] Date: 05/09/2023 Prepared by: [facility] -  Outpatient  Rehab - [facility]   GOALS: Goals reviewed with patient? Yes  SHORT TERM GOALS: Target date: 05/31/23  Pt will be independent with PD specific HEP. Baseline: Goal status: MET - 05/30/23  2.  Pt will verbalize understanding of task modifications/adaptive strategies and/or potential AE needs to increase ease, safety, and independence w/ ADLs/IADLs (as needed for medication management) Baseline:  Goal status: MET - 05/30/23  3.  Pt will verbalize understanding of ways to prevent future PD related complications and PD community resources. Baseline:  Goal status: MET - 05/30/23   LONG TERM GOALS: Target date: 06/28/23  Pt will write a short paragraph with 100% legibility and no significant decrease in letter size. Baseline:  Goal status: IN PROGRESS  2.  Pt will demonstrate ability to retrieve a lightweight object from moderate height self with increased trunk flexion, shoulder flexion, and elbow extension. Baseline: Standing functional reach: Right: 3 inches; Left: 3 inches Goal status: MET - standing functional reach 7.5 inches bilaterally on 06/05/23  3.  Pt will demonstrate understanding of memory compensations and ways to keep thinking skills sharp Baseline:  Goal status: MET - 06/24/23  4.  Pt will demonstrate improved UE functional use for ADLs as evidenced by increasing box/ blocks score by 4 blocks with RUE/LUE Baseline: R: 52, L: 50 Goal status: IN PROGRESS - partially met R: 61 and L: 52 on 06/17/23  5.  Pt will demonstrate improved ease with fastening buttons as evidenced by decreasing 3 button/ unbutton time to <30 seconds Baseline: 37.4 sec Goal status: MET - 26.25 on 06/19/23   ASSESSMENT:  CLINICAL IMPRESSION: Pt demonstrating improved coordination, sequencing, and memory with pill box assessment and medication management.  Pt demonstrating limitations in back with forward bending and reaching behind self due to tightness in back.     PERFORMANCE DEFICITS: in functional skills including ADLs, IADLs, coordination, tone, ROM, strength, flexibility, Fine motor control, Gross motor control, balance, body mechanics, endurance, decreased knowledge of precautions, decreased knowledge of use of DME, and UE functional use, cognitive skills including memory, problem solving, and safety awareness, and psychosocial skills including coping strategies and environmental adaptation.   IMPAIRMENTS: are limiting patient from ADLs and IADLs.      PLAN:  OT FREQUENCY: 2x/week  OT DURATION: 8 weeks  PLANNED INTERVENTIONS: self care/ADL training, therapeutic exercise, therapeutic activity, neuromuscular re-education, passive range of motion, functional mobility training, ultrasound, compression bandaging, moist heat, cryotherapy, patient/family education, cognitive remediation/compensation, psychosocial skills training, energy conservation, coping strategies training, and DME and/or AE instructions  RECOMMENDED OTHER SERVICES: NA  CONSULTED AND AGREED WITH PLAN OF CARE: Patient  PLAN FOR NEXT SESSION: review any HEP that pt may have questions/concerns about.  D/c after next session.   Rosalio Loud, OTR/L 06/24/2023, 11:43  AM

## 2023-06-24 NOTE — Patient Instructions (Addendum)
Bag Exercises:  Small trash bag or produce bag works best.  For all exercises, sit with big posture (sit up tall with head up) and use big movements. Perform the following exercises 1 times per day.  Hold end of bag in one hand. Stretch fingers out big to draw the entire bag into your palm. Repeat 3-5 times with each hand. Hold bag in both hands in front of you with hands/arms shoulder length apart. Move bag behind your head. Repeat 10 times. Hold bag in both hands in front of you with hands/arms shoulder length apart. Lift leg and move bag completely under each foot and back. Repeat 5-10 times on each side. Hold bag in one hand. Stretch both arms/hands out to the side as big as you can. Then, pass bag from one hand to the other IN FRONT of you. Stretch arms back out big after each pass. Repeat 5-10 times. Hold bag in one hand. Stretch both arms/hands out to the side as big as you can. Then, pass bag from one hand to the other BEHIND you. Stretch arms back out big after each pass. Repeat 5-10 times. Hold bag in one hand. Stretch both arms/hands out to the side in a diagonal as big as you can. Then, pass bag from one hand to the other IN FRONT of you in a diagonal pattern. Stretch arms back out big after each pass. Repeat 5-10 times. Hold bag in right hand. Move right hand to reach behind shoulder. Then, reach behind back with left hand to pass bag from right hand to left hand. Switch sides. Repeat 5-10 times on each side.

## 2023-06-26 ENCOUNTER — Ambulatory Visit: Payer: Medicare Other | Admitting: Occupational Therapy

## 2023-06-27 ENCOUNTER — Ambulatory Visit: Payer: Medicare Other | Admitting: Occupational Therapy

## 2023-06-27 DIAGNOSIS — Z1231 Encounter for screening mammogram for malignant neoplasm of breast: Secondary | ICD-10-CM | POA: Diagnosis not present

## 2023-06-27 DIAGNOSIS — R278 Other lack of coordination: Secondary | ICD-10-CM

## 2023-06-27 DIAGNOSIS — M25611 Stiffness of right shoulder, not elsewhere classified: Secondary | ICD-10-CM | POA: Diagnosis not present

## 2023-06-27 DIAGNOSIS — M6281 Muscle weakness (generalized): Secondary | ICD-10-CM

## 2023-06-27 DIAGNOSIS — R29818 Other symptoms and signs involving the nervous system: Secondary | ICD-10-CM

## 2023-06-27 DIAGNOSIS — R2681 Unsteadiness on feet: Secondary | ICD-10-CM | POA: Diagnosis not present

## 2023-06-27 NOTE — Therapy (Signed)
OUTPATIENT OCCUPATIONAL THERAPY PARKINSON'S  Treatment Note & Discharge  Patient Name: Andrea Burke MRN: [id number] DOB:1946-03-16, 77 y.o., female Today's Date: 06/27/2023  PCP: Catha Gosselin, MD REFERRING PROVIDER: Tat, Octaviano Batty, DO  OCCUPATIONAL THERAPY DISCHARGE SUMMARY  Visits from Start of Care: 12  Current functional level related to goals / functional outcomes: Pt has demonstrated improvements in coordination with 9 hole peg test, box and blocks, as well as handwriting and clothing fasteners.  Pt has HEP for coordination and large amplitude.   Remaining deficits: Intermittent increase in tremors with handwriting and typing.   Education / Equipment: HEP for coordination and large amplitude, education on modified grasp on writing and eating utensils.  Provided with community and online resources for PD to fit her routine.   Patient agrees to discharge. Patient goals were met. Patient is being discharged due to meeting the stated rehab goals..     END OF SESSION:  OT End of Session - 06/27/23 0803     Visit Number 12    Number of Visits 17    Date for OT Re-Evaluation 06/28/23    Authorization Type Medicare A&B    OT Start Time 0801    OT Stop Time 0843    OT Time Calculation (min) 42 min                       Past Medical History:  Diagnosis Date   Cancer (HCC)    KIDNEY   Discoid lupus    Endometriosis    Headache(784.0)    Insomnia    Kidney disease    Lupus (HCC)    Osteopenia    Urinary incontinence    Past Surgical History:  Procedure Laterality Date   CATARACT EXTRACTION, BILATERAL     PELVIC LAPAROSCOPY     DIAG LASER LAP-ENDOMETRIOSIS   REMOVAL OF LEFT KIDNEY  10/08/2006   Patient Active Problem List   Diagnosis Date Noted   Major depressive disorder, single episode, moderate (HCC) 08/06/2018   Moderate major depression (HCC) 06/23/2018   Anxiety disorder 06/23/2018   Pruritus 04/01/2017   Onychomycosis 05/12/2012    Lentigines 05/12/2012   Discoid lupus    Cancer (HCC)    Headache(784.0)    Insomnia    Endometriosis    Osteopenia     ONSET DATE: referral 04/26/23 (recent PD diagnosis)  REFERRING DIAG: G20.A1 (ICD-10-CM) - Parkinson's disease without dyskinesia or fluctuating manifestations  THERAPY DIAG:  Other symptoms and signs involving the nervous system  Muscle weakness (generalized)  Other lack of coordination  Stiffness of right shoulder, not elsewhere classified  Rationale for Evaluation and Treatment: Rehabilitation  SUBJECTIVE:   SUBJECTIVE STATEMENT: Pt reports going to the [employer] class yesterday Pt accompanied by: self  PERTINENT HISTORY: recent dx of PD, memory loss, depression  PRECAUTIONS: Fall, back pain/muscle spasms  WEIGHT BEARING RESTRICTIONS: No  PAIN:  Are you having pain? Yes, reports "little bit" of tightness in back, unrated.   FALLS: Has patient fallen in last 6 months? No  LIVING ENVIRONMENT: Lives with: lives with their spouse Lives in: House/apartment Stairs: Yes: External: 2-3 steps; on left going up Has following equipment at home: Grab bars  PLOF: Independent  Enjoys volunteering at [employer]; enjoys travelling.  PATIENT GOALS: control the arm/hand tremors with hand use  OBJECTIVE:   HAND DOMINANCE: Right  ADLs: Overall ADLs: Increased time, difficulty with clothing fasteners Transfers/ambulation related to ADLs: Independent Eating: increased spilling and  dropping of foods UB Dressing: difficulty with buttons and other clothing fasteners LB Dressing: increased time and difficulty with clothing fasteners Toileting: occasional need to pull self up Bathing: Independent Tub Shower transfers: Mod I, holding on to door and grab bar when stepping in/out of shower  IADLs: Light housekeeping: increased tremors impacting washing dishes, folding laundry Meal Prep: completes with increased time/effort Community mobility:  driving Medication management: difficulty with coordination, dropping pills occasionally.  Does keep a log to ensure recall of when taking medications Handwriting: 100% legible and PPT #1 (whales live in a blue ocean): 14.72 sec, reports handwriting used to be more rounded and occasionally "arm jerks" when writing.  MOBILITY STATUS: Independent  POSTURE COMMENTS:  rounded shoulders and forward head  ACTIVITY TOLERANCE: Activity tolerance: WFL for tasks assessed on eval  FUNCTIONAL OUTCOME MEASURES: 06/05/23 Standing functional reach: Right: 7.5 inches; Left: 7.5 inches Fastening/unfastening 3 buttons: 32.90 sec  EVAL Standing functional reach: Right: 3 inches; Left: 3 inches Fastening/unfastening 3 buttons: 37.40 sec Physical performance test: PPT#2 (simulated eating) 14.12 sec & PPT#4 (donning/doffing jacket): 13.25 sec  COORDINATION: 06/27/23 Box and blocks: L: 54  06/17/23 Box and blocks: R: 61 and L: 52  06/05/23 Box and blocks: Right: 48 blocks, Left: 50 blocks  9 Hole Peg test: Right: 24.0 sec; Left: 26.62 sec Box and Blocks:  Right 52 blocks, Left 50 blocks Tremors: Resting, action, and Right  UE ROM:   06/19/23: Improved internal rotation bilaterally (able to reach towards opposite hip behind back)  Eval:  decreased shoulder flexion bilaterally, however WFL;  hold RUE in shoulder internal rotation, decreased internal rotation bilaterally, limited to reaching to top of hips not able to reach mid back.   SENSATION: Reports intermittent light tingling in R hand  MUSCLE TONE: RUE: Mild and Hypertonic  COGNITION: Overall cognitive status: Within functional limits for tasks assessed   TODAY'S TREATMENT:                                                                      DATE:  06/27/23 Engaged in large amplitude hands with focus on full UB movement with wrist extension, elbow extension, and finger extension.  Completed finger flicks and thumb opposition in  preparation for coordination tasks. Review HEP: pt with questions about internal and external rotation exercises for appropriate setup and positioning.  OT reviewed with pt able to return demonstration.  Reviewed bag exercises with focus on large amplitude reaching in front, behind, and at a diagonal.  Pt reports feeling the tightness in her R arm with reaching behind back. Box and blocks: R: 56 blocks and L: 54 blocks Handwriting: Pt wrote short paragraph about [employer] class with good legibility. Pt reports mild tightness in wrist with writing.  OT reiterated integrating short rest breaks into writing as well as wrist flexion/extension stretches as needed.    06/24/23 Medication: engaged in Pill box assessment with pt completing in 4:18 with no errors.  Pt did demonstrate good technique with sequentially administering and then double checking.  Pt reports that she double and triple checks herself when she administers meds at home.   Large amplitude: engaged in simulated ADLS for the following activities (using a plastic bag): donning shirt, drying back,  pulling down shirt, pulling up socks and donning pants.  OT providing demonstration and verbal cues for improved technique.  Pt with most difficulty with tossing bag over back for simulated drying of back.  Also reports fatigue in back with reaching behind back and when reaching towards floor in simulated LB dressing.    06/19/23 Coordination: engaged in small peg board pattern replication with focus on [facility] and coordination progressing to in-hand manipulation and translation of pegs from palm to finger tips.  Pt with increased difficulty with translation - particularly maintaining pegs in hand while moving pegs to finger tips, however with repetition pt demonstrating improved motor control.  OT providing cues for large amplitude as able when picking up and placing pegs.  Buttons: 26.25 sec Handwriting: Pt writing paragraph with improved legibility  and no decrease in letter size this session.  Pt utilizing lightweight pen with small built up handle, therefore recommending pt obtain a pen with a handle/grip to decrease pressure when writing and allow for modified squeeze/release during writing.     PATIENT EDUCATION: Education details: ongoing condition specific education Person educated: Patient Education method: Explanation Education comprehension: verbalized understanding and needs further education  HOME EXERCISE PROGRAM: Large amplitude exercises - see pt instructions  Access Code: [email or web address] URL: [email or web address] Date: 05/09/2023 Prepared by: [facility] Out[facility] - [facility]   GOALS: Goals reviewed with patient? Yes  SHORT TERM GOALS: Target date: 05/31/23  Pt will be independent with PD specific HEP. Baseline: Goal status: MET - 05/30/23  2.  Pt will verbalize understanding of task modifications/adaptive strategies and/or potential AE needs to increase ease, safety, and independence w/ ADLs/IADLs (as needed for medication management) Baseline:  Goal status: MET - 05/30/23  3.  Pt will verbalize understanding of ways to prevent future PD related complications and PD community resources. Baseline:  Goal status: MET - 05/30/23   LONG TERM GOALS: Target date: 06/28/23  Pt will write a short paragraph with 100% legibility and no significant decrease in letter size. Baseline:  Goal status: IN PROGRESS  2.  Pt will demonstrate ability to retrieve a lightweight object from moderate height self with increased trunk flexion, shoulder flexion, and elbow extension. Baseline: Standing functional reach: Right: 3 inches; Left: 3 inches Goal status: MET - standing functional reach 7.5 inches bilaterally on 06/05/23  3.  Pt will demonstrate understanding of memory compensations and ways to keep thinking skills sharp Baseline:  Goal status: MET - 06/24/23  4.  Pt will demonstrate improved UE  functional use for ADLs as evidenced by increasing box/ blocks score by 4 blocks with RUE/LUE Baseline: R: 52, L: 50 Goal status: MET - R: 56 and L: 54 on 06/27/23  5.  Pt will demonstrate improved ease with fastening buttons as evidenced by decreasing 3 button/ unbutton time to <30 seconds Baseline: 37.4 sec Goal status: MET - 26.25 on 06/19/23   ASSESSMENT:  CLINICAL IMPRESSION: OT reviewed awareness of possible impact of medication and life of PD medications on functional tasks and movements. Also discussed typical routine for f/u with PD with plan for PD screen scheduled for June 2025.  OT encouraged pt to reach out to MD if noting changes prior to scheduled screen to request referral for therapy services as needed.  Pt asking good questions about coordination and large amplitude tasks.  Pt demonstrating improved coordination with handwriting and Box and blocks this session.  Reviewed large amplitude "bag exercises" with focus on quality and amplitude  of movements.    PERFORMANCE DEFICITS: in functional skills including ADLs, IADLs, coordination, tone, ROM, strength, flexibility, Fine motor control, Gross motor control, balance, body mechanics, endurance, decreased knowledge of precautions, decreased knowledge of use of DME, and UE functional use, cognitive skills including memory, problem solving, and safety awareness, and psychosocial skills including coping strategies and environmental adaptation.   IMPAIRMENTS: are limiting patient from ADLs and IADLs.      PLAN:  OT FREQUENCY: 2x/week  OT DURATION: 8 weeks  PLANNED INTERVENTIONS: self care/ADL training, therapeutic exercise, therapeutic activity, neuromuscular re-education, passive range of motion, functional mobility training, ultrasound, compression bandaging, moist heat, cryotherapy, patient/family education, cognitive remediation/compensation, psychosocial skills training, energy conservation, coping strategies training, and DME  and/or AE instructions  RECOMMENDED OTHER SERVICES: NA  CONSULTED AND AGREED WITH PLAN OF CARE: Patient  PLAN FOR NEXT SESSION: NA, D/c today with screen in 6-9 months   Blenda Wisecup, OTR/L 06/27/2023, 8:47 AM

## 2023-07-03 ENCOUNTER — Ambulatory Visit: Payer: Medicare Other

## 2023-07-11 ENCOUNTER — Ambulatory Visit: Payer: Medicare Other | Admitting: Psychiatry

## 2023-07-11 DIAGNOSIS — F419 Anxiety disorder, unspecified: Secondary | ICD-10-CM

## 2023-07-11 NOTE — Progress Notes (Signed)
[facility] Counselor/Therapist Progress Note  Patient ID: Andrea Burke, MRN: [id number],    Date: 07/11/2023  Time Spent: 53 minutes   Treatment Type: Individual Therapy  Reported Symptoms: anxiety "is main symptom", depression, some tearfulness re: family    Mental Status Exam:  Appearance:   Neat     Behavior:  Appropriate, Sharing, and Motivated  Motor:  Affected by her Parkinson's  Speech/Language:   Clear and Coherent  Affect:  Depressed and anxious  Mood:  anxious and depressed  Thought process:  goal directed  Thought content:    WNL  Sensory/Perceptual disturbances:    WNL  Orientation:  oriented to person, place, time/date, situation, day of week, month of year, year, and stated date of Oct. 3, 2024  Attention:  Fair  Concentration:  Good  Memory:  Some short term issues and Dr is aware  Fund of knowledge:   Good and Fair  Insight:    Fair  Judgment:   Good  Impulse Control:  Good   Risk Assessment: Danger to Self:  No Self-injurious Behavior: No Danger to Others: No Duty to Warn:no Physical Aggression / Violence:No  Access to Firearms a concern: No  Gang Involvement:No   Subjective:  Patient in for session today and reporting anxiety, some depression and fears but is "accepting my Parkinson's better and am stronger going down steps" which she feels the meds and PT are helping.  Some tearfulness expressed and talk through her feelings well today.  Concerned about husband's cancer and he is to go for another procedure soon at [facility]. Misses having her little dog and said today that they may get another dog once they know more about both of their health statuses. Sister with breast CA recently had double mastectomy but doing ok currently.  Talked through some of her current fears and stressors in more detail today which seemed helpful to her.  Adds that there are not a lot of people that she feels she can talk to on a deeper level about this, and her  husband rarely communicates about it "as he is always one that keeps things inside".  Does seem to be better managing some volatile emotions and able to also focus on her strength in the midst of physical challenges for her and her husband, while also allowing herself to receive comfort and support.  Interventions: Cognitive Behavioral Therapy and Ego-Supportive  Long-Term goal: (measurable)  Enhance the ability to handle effectively the full variety of life's anxieties. Patient will eventually rate herself as a "3 or under on a 1-10 scale for depression and for anxiety, for a period of at least 2 months."  Strategies:  Patient will work on re-framing thoughts that have been leading her to feel more anxious.  The re-framed thoughts will support more calmness and personal empowerment for patient.     Short Term goal:  Increase understanding of beliefs and messages that produce worry and anxiety. Strategies: Patient will work with therapist to gain more understanding of how anxiety and worry are supported by certain beliefs and messages.  She will be able to give at least 3 examples of such messages in an upcoming session.   Diagnosis:   ICD-10-CM   1. Anxiety disorder, unspecified type  F41.9      Plan:  Patient today in session and reporting anxiety, some depression, fearful thoughts, all related to her recently being diagnosed with Parkinson's disease.  Found session helpful today and talking  about the anxious feelings with which she is dealing and also to feel heard and understood.  She is very appreciative of her family support during all of this.  Patient reports that she is continuing to follow through on recommendations by her doctor in terms of appointments and activities related to her Parkinson's diagnosis.  Trying to avoid thinking "those worst-case scenarios" and instead follow through on the medical advice given to her by her med providers, and accept the encouragement and support from  family and friends who are helping support her during this time.  Patient's husband has also recently been diagnosed with cancer and planned to get a second opinion on his diagnosis.  Upon getting that second opinion, he has been referred to a specialist and is to have another procedure soon. Reminded patient to be practicing self affirming behaviors as noted in sessions including: Trusting her ability to follow through on recommendations per her neurologist regarding coping with Parkinson symptoms, eating patient with herself, trying to refrain from assuming worst-case scenarios, asking for help when she needs it, limiting her watching of distressing news on TV or online as this accentuates her anxiety, maintain contact with people who are supportive, stay on her prescribed medications, and realize the strength she shows working with goal-directed behaviors to move in a direction that supports her improved emotional health and her outlook into the future.  Patient has definitely made progress and needs to continue working with goal-directed behaviors to keep moving in a forward and healthier direction as much as possible.  Goal review and progress/challenges noted with patient.  Next appointment within 3 to 4 weeks.   Mathis Fare, LCSW

## 2023-07-24 NOTE — Progress Notes (Unsigned)
Assessment/Plan:   1.  Parkinsons's disease  -Looks markedly improved on levodopa.  I am going to increase it a little, but she thinks that the medication is making her a bit sleepy.  Several options were given, and ultimately she decided to try Rytary.  We will trial Rytary, 195 mg, 1 capsule 3 times per day.  She is to call me in a few weeks and let me know how she is doing.             -Discussed increasing exercise.             -discussed Parkinsons Disease GeneRation study.  Father with Parkinsons Disease    2.  Memory loss, per patient             -I do not suspect neurodegenerative.             -I suspect pseudodementia from underlying depression.  -Patient is work-related we will schedule neurocognitive testing.   3.  Depression             -Currently seeing psychiatry nurse practitioner at [facility] and on Cymbalta.  Has tried and failed numerous antidepressants.  -She is following with counseling as well as the nurse practitioner.  4.  GERD  -f/u pcp  Subjective:   Andrea Burke was seen today in follow up for Parkinsons disease, diagnosed last visit.  My previous records were reviewed prior to todays visit as well as outside records available to me.  Patient is with her husband who supplements the history.  We started the patient on levodopa last visit.  She is tolerating medication well, without side effects.  We did discuss the importance of getting up before 10 AM and starting her medication at that time.  She states that she wonders if GERD is from it but otherwise no SE. pt denies falls.  She has been to therapy since our last visit.  Pt only with one instance of lightheadedness - got up from dentist chair.  Otherwise no  near syncope.  No hallucinations.  She is going to [facility] moves 1 time per week and trying to get to the gym other days per week.  She continues to follow with [facility] psychiatric nurse practitioner.  Continues to c/o memory loss.    Current  prescribed movement disorder medications: Carbidopa/levodopa 25/100, 1 tablet 3 times per day.   PREVIOUS MEDICATIONS: Sinemet Prior medications from psychiatry:  Mirtazapine (increased appetite, possibly dreaming);  Lexapro (nightmares); Sertraline-agitation, insomnia, impaired concentration Paxil-intolerable side effects Fluoxetine-intolerable side effects Viibryd- Partial improvement on 20 mg. Does not want to take doses above 20 mg. Cymbalta Wellbutrin- Severe tremors Lithium Hydroxyzine- has taken for itching.  Xanax Deplin Gabapentin- prescribed for pain  ALLERGIES:   Allergies  Allergen Reactions   Doxycycline Calcium    Penicillins     CURRENT MEDICATIONS:  Current Meds  Medication Sig   Calcium Carbonate-Vitamin D (CALTRATE 600+D PO) Take by mouth.   carbidopa-levodopa (SINEMET IR) 25-100 MG tablet Take 1 tablet by mouth 3 (three) times daily. 10am/2pm/6pm   clobetasol cream (TEMOVATE) 0.05 % Apply topically.   Clobetasol Propionate (TEMOVATE) 0.05 % external spray Apply to scalp twice daily x 2 wk, then daily x 2 wk, then every other day x 2 wk. Not to face.   DULoxetine (CYMBALTA) 30 MG capsule TAKE 1 CAPSULE DAILY   finasteride (PROSCAR) 5 MG tablet Take 5 mg by mouth daily.   fish oil-omega-3 fatty acids  1000 MG capsule Take 2 g by mouth daily.   hydroxychloroquine (PLAQUENIL) 200 MG tablet Take by mouth daily.   Multiple Vitamins-Minerals (CENTRUM SILVER 50+WOMEN) TABS Take Daily     Objective:   PHYSICAL EXAMINATION:    VITALS:   Vitals:   07/25/23 1534  BP: 132/82  Pulse: 76  SpO2: 99%  Weight: 142 lb 3.2 oz (64.5 kg)  Height: 5' (1.524 m)    GEN:  The patient appears stated age and is in NAD. HEENT:  Normocephalic, atraumatic.  The mucous membranes are moist. The superficial temporal arteries are without ropiness or tenderness.  Neurological examination:  Orientation: The patient is alert and oriented x3.  Cranial nerves: There is good  facial symmetry.  Extraocular muscles are intact. The visual fields are full to confrontational testing. The speech is fluent and clear. Soft palate rises symmetrically and there is no tongue deviation. Hearing is intact to conversational tone. Sensation: Sensation is intact to light touch throughout Motor: Strength is at least antigravity x 4  Movement examination: Tone: There is mild increased tone in the RUE.  There is nl tone in the RLE.  There is mild tone in the LUE and nl in the LLE.  This is a marked improvement. Abnormal movements: there is mild tremor in the RUE/RLE.  This is very mild and an improvement. Coordination:  There is no decremation with any form of RAMS, including alternating supination and pronation of the forearm, hand opening and closing, finger taps, heel taps and toe taps.  This is an improvement. Gait and Station: Patient ambulates well in the hall.  I have reviewed and interpreted the following labs independently    Chemistry      Component Value Date/Time   NA 132 (L) 02/26/2023 0515   K 4.0 02/26/2023 0515   CL 103 02/26/2023 0515   CO2 22 02/26/2023 0515   BUN 14 02/26/2023 0515   CREATININE 0.81 02/26/2023 0515   CREATININE 0.72 07/31/2014 1222      Component Value Date/Time   CALCIUM 9.1 02/26/2023 0515   ALKPHOS 54 07/31/2014 1222   AST 21 07/31/2014 1222   ALT 17 07/31/2014 1222   BILITOT 0.6 07/31/2014 1222       Lab Results  Component Value Date   WBC 3.6 (L) 02/26/2023   HGB 13.2 02/26/2023   HCT 39.9 02/26/2023   MCV 85.6 02/26/2023   PLT 169 02/26/2023    Lab Results  Component Value Date   TSH 1.45 09/29/2018     Total time spent on today's visit was 44 minutes, including both face-to-face time and nonface-to-face time.  Time included that spent on review of records (prior notes available to me/labs/imaging if pertinent), discussing treatment and goals, answering patient's questions and coordinating care.  Cc:  Catha Gosselin,  MD

## 2023-07-25 ENCOUNTER — Ambulatory Visit: Payer: Medicare Other | Admitting: Neurology

## 2023-07-25 VITALS — BP 132/82 | HR 76 | Ht 60.0 in | Wt 142.2 lb

## 2023-07-25 DIAGNOSIS — G20A1 Parkinson's disease without dyskinesia, without mention of fluctuations: Secondary | ICD-10-CM | POA: Diagnosis not present

## 2023-07-25 DIAGNOSIS — R413 Other amnesia: Secondary | ICD-10-CM

## 2023-07-25 MED ORDER — RYTARY 48.75-195 MG PO CPCR: ORAL_CAPSULE | ORAL | Status: DC

## 2023-07-25 NOTE — Patient Instructions (Signed)
You have been referred for a neurocognitive evaluation (i.e., evaluation of memory and thinking abilities). Please bring someone with you to this appointment if possible, as it is helpful for the neuropsychologist to hear from both you and another adult who knows you well. Please bring eyeglasses and hearing aids if you wear them and take any medications as you normally would. Please fully abstain from all alcohol, marijuana, or other substances prior to your appointment.   The evaluation will take approximately 2-3 hours and has two parts:   The first part is a clinical interview with the neuropsychologist, Dr. Milbert Coulter.  During the interview, the neuropsychologist will speak with you and the individual you brought to the appointment.    The second part of the evaluation is testing with the doctor's technician, aka psychometrician, Annabelle Harman or [employer]. During the testing, the technician will ask you to remember different types of material, solve problems, and answer some questionnaires. Your family member will not be present for this portion of the evaluation.   Please note: We have to reserve several hours of the neuropsychologist's time and the psychometrician's time for your evaluation appointment. As such, there is a No-Show fee of $100. If you are unable to attend any of your appointments, please contact our office as soon as possible to reschedule.

## 2023-07-31 ENCOUNTER — Ambulatory Visit: Payer: Medicare Other | Admitting: Psychiatry

## 2023-08-06 ENCOUNTER — Telehealth: Payer: Self-pay | Admitting: Neurology

## 2023-08-06 NOTE — Telephone Encounter (Signed)
Patient called asking if iron interacts with the carbidopa levodopa . Patient was getting her multi vitamins and was told by another doctor that she had to get multi vitamins without iron. Patient also wanted to know if there was anything else she she should or should not be taking as far as supplements or foods. I did tell patient about protein interaction and to drink plenty of water

## 2023-08-06 NOTE — Telephone Encounter (Signed)
Patient has some questions about supplements and food. She would like a call back

## 2023-08-06 NOTE — Telephone Encounter (Signed)
Called and spoke to patients husband with Dr. Iona Beard reccomendations

## 2023-08-08 ENCOUNTER — Telehealth: Payer: Self-pay | Admitting: Neurology

## 2023-08-08 ENCOUNTER — Ambulatory Visit (INDEPENDENT_AMBULATORY_CARE_PROVIDER_SITE_OTHER): Payer: Medicare Other | Admitting: Psychiatry

## 2023-08-08 DIAGNOSIS — F419 Anxiety disorder, unspecified: Secondary | ICD-10-CM | POA: Diagnosis not present

## 2023-08-08 NOTE — Telephone Encounter (Signed)
Called patient back and let her know no B6 or Restora [employer]

## 2023-08-08 NOTE — Telephone Encounter (Signed)
Pt called back in requesting a call back. She would like a recommendation for a multi vitamin that a parkinson's patient can take

## 2023-08-08 NOTE — Progress Notes (Signed)
[facility] Counselor/Therapist Progress Note  Patient ID: Andrea Burke, MRN: [id number],    Date: 08/08/2023  Time Spent: 55 minutes   Treatment Type: Individual Therapy  Reported Symptoms: anxiety, depression, some tearfulness re: personal and family    Mental Status Exam:  Appearance:   Neat and Well Groomed     Behavior:  Appropriate, Sharing, and Motivated  Motor:  Normal  Speech/Language:   Clear and Coherent  Affect:  Depressed and anxiety  Mood:  anxious, depressed, and some tearfulness but "better"  Thought process:  goal directed  Thought content:    Some rumination "but not excessive"  Sensory/Perceptual disturbances:    WNL  Orientation:  oriented to person, place, time/date, situation, day of week, month of year, year, and stated date of Oct. 31, 2024  Attention:  Fair  Concentration:  Fair  Memory:  WNL Has noticed occasional short term memory issues  Fund of knowledge:   Good  Insight:    Good  Judgment:   Good  Impulse Control:  Good and Fair   Risk Assessment: Danger to Self:  No Self-injurious Behavior: No Danger to Others: No Duty to Warn:no Physical Aggression / Violence:No  Access to Firearms a concern: No  Gang Involvement:No   Subjective:  Patient in today and reports anxiety, depression and some fearfulness re: her and her husband's medical and emotional issues. Patient doing well in talking today through her stressors particularly her own health concern, husband's health concerns, her 21 yr old mother, the "upcoming election" concerns. Did seem to benefit from venting her concerns and feeling heard. Has plans to visit family in [place] area this weekend and looking forward to it. Missing having a dog that was a big emotional support, and thinking about possibly getting another one. Still feeling she is some better in going down steps and that is encouraging but going up steps can be more challenging.  Getting out as she is able, staying  in phone contact with family and a couple friends. Encouraged to continue some of her PT exercises and movements. Some tearfulness but also showing some improvement in her coping and following through on her goal-directed behaviors.  Interventions: Cognitive Behavioral Therapy and Ego-Supportive  Long-Term goal: (measurable)  Enhance the ability to handle effectively the full variety of life's anxieties. Patient will eventually rate herself as a "3 or under on a 1-10 scale for depression and for anxiety, for a period of at least 2 months."  Strategies:  Patient will work on re-framing thoughts that have been leading her to feel more anxious.  The re-framed thoughts will support more calmness and personal empowerment for patient.     Short Term goal:  Increase understanding of beliefs and messages that produce worry and anxiety. Strategies: Patient will work with therapist to gain more understanding of how anxiety and worry are supported by certain beliefs and messages.  She will be able to give at least 3 examples of such messages in an upcoming session.   Diagnosis:   ICD-10-CM   1. Anxiety disorder, unspecified type  F41.9      Plan: Ends patient in session today and did well and working through some of her anxiety, depression, fearful thoughts mostly related to her diagnosis with Parkinson's disease and also her husband's physical challenges and "the world situation".  States that she continues to follow through on recommendations by her doctor and in sessions she has here at our office.  Is doing  some better with not thinking about "worst-case scenarios" or interrupting herself when she does.  Is concerned about her husband's cancer diagnosis and is supporting him in his decisions and treatment.  Reminded patient to be practicing self affirming behaviors as noted in sessions including: Trusting her ability to follow through on recommendations per her neurologist regarding coping with Parkinson  symptoms, being patient with herself, trying to refrain from assuming worst-case scenarios, asking for help when needed, limiting her watching of distressing news on TV or online as this accentuates her anxiety, maintain contact with people who are supportive, remain on her prescribed medications, and recognize the strength she shows working with goal-directed behaviors to move in a direction that supports her improved emotional health and her overall wellbeing. Patient has made progress and needs to continue her work with goal-directed behaviors to move in a forward, healthier direction is much as possible.  Goal review and progress/challenges noted with patient.  Next appointment within 3 to 4 weeks.   Mathis Fare, LCSW

## 2023-08-12 ENCOUNTER — Encounter: Payer: Self-pay | Admitting: Psychiatry

## 2023-08-12 ENCOUNTER — Ambulatory Visit (INDEPENDENT_AMBULATORY_CARE_PROVIDER_SITE_OTHER): Payer: Medicare Other | Admitting: Psychiatry

## 2023-08-12 DIAGNOSIS — F419 Anxiety disorder, unspecified: Secondary | ICD-10-CM

## 2023-08-12 DIAGNOSIS — F330 Major depressive disorder, recurrent, mild: Secondary | ICD-10-CM | POA: Diagnosis not present

## 2023-08-12 MED ORDER — DULOXETINE HCL 30 MG PO CPEP: 30.0000 mg | ORAL_CAPSULE | Freq: Every day | ORAL | 1 refills | Status: DC

## 2023-08-12 NOTE — Progress Notes (Signed)
Andrea Burke [id number] December 09, 1945 77 y.o.  Subjective:   Patient ID:  Andrea Burke is a 77 y.o. (DOB 02/09/1946) female.  Chief Complaint:  Chief Complaint  Patient presents with   Follow-up    Depression, Anxiety    HPI Andrea Burke presents to the office today for follow-up of depression and anxiety.  She reports that she has been "up and down" with some mild depression. She reports that her anxiety has been "up a little bit." She reports worry about her and her husband's health. She reports that she feels sleepy during the day and could fall asleep if sitting still. Energy has been ok. Motivation is somewhat low. Sleeping well at night. She reports, "I'm still having dreams... most of them are vivid dreams" and occasionally her husband wakes her because she was screaming. Appetite has been decreased. Denies change in weight. Denies SI.   Husband has had a couple of surgeries for cancer and they are "not sure if they got it all." She reports that husband has other health issues as well, to include falls.   Saw family this weekend.   Past medication trials: Remeron- vivid dreams, increased appetite Lexapro-nightmares Sertraline-agitation, insomnia, impaired concentration Paxil-intolerable side effects Fluoxetine-intolerable side effects Viibryd- Partial improvement on 20 mg. Does not want to take doses above 20 mg. Cymbalta Wellbutrin- Severe tremors Lithium Hydroxyzine- has taken for itching.  Xanax Deplin Gabapentin- prescribed for pain  Mini-Mental    Flowsheet Row Office Visit from 11/18/2020 in [facility] Office Visit from 04/25/2020 in [facility]  Total Score (max 30 points ) 29 29      Flowsheet Row ED from 02/26/2023 in [facility] at [facility]  C-SSRS RISK CATEGORY No Risk        Review of Systems:  Review of Systems  Musculoskeletal:  Negative for gait  problem.  Neurological:  Negative for tremors.       She reports that she is "walking better" and balance has improved  Psychiatric/Behavioral:         Please refer to HPI    Medications: I have reviewed the patient's current medications.  Current Outpatient Medications  Medication Sig Dispense Refill   Calcium Carbonate-Vitamin D (CALTRATE 600+D PO) Take by mouth.     Carbidopa-Levodopa ER (RYTARY) 48.75-195 MG CPCR 1 capsule at 9am/1pm/4pm     clobetasol cream (TEMOVATE) 0.05 % Apply topically.     Clobetasol Propionate (TEMOVATE) 0.05 % external spray Apply to scalp twice daily x 2 wk, then daily x 2 wk, then every other day x 2 wk. Not to face.     finasteride (PROSCAR) 5 MG tablet Take 5 mg by mouth daily.     fish oil-omega-3 fatty acids 1000 MG capsule Take 2 g by mouth daily.     hydroxychloroquine (PLAQUENIL) 200 MG tablet Take by mouth daily.     Multiple Vitamins-Minerals (CENTRUM SILVER 50+WOMEN) TABS Take Daily     DULoxetine (CYMBALTA) 30 MG capsule Take 1 capsule (30 mg total) by mouth daily. 90 capsule 1   No current facility-administered medications for this visit.    Medication Side Effects: None  Allergies:  Allergies  Allergen Reactions   Doxycycline Calcium    Penicillins     Past Medical History:  Diagnosis Date   Cancer (HCC)    KIDNEY   Discoid lupus    Endometriosis    Headache(784.0)    Insomnia  Kidney disease    Lupus    Osteopenia    Urinary incontinence     Past Medical History, Surgical history, Social history, and Family history were reviewed and updated as appropriate.   Please see review of systems for further details on the patient's review from today.   Objective:   Physical Exam:  There were no vitals taken for this visit.  Physical Exam Constitutional:      General: She is not in acute distress. Musculoskeletal:        General: No deformity.  Neurological:     Mental Status: She is alert and oriented to person,  place, and time.     Coordination: Coordination normal.  Psychiatric:        Attention and Perception: Attention and perception normal. She does not perceive auditory or visual hallucinations.        Mood and Affect: Affect is not labile, blunt, angry or inappropriate.        Speech: Speech normal.        Behavior: Behavior normal.        Thought Content: Thought content normal. Thought content is not paranoid or delusional. Thought content does not include homicidal or suicidal ideation. Thought content does not include homicidal or suicidal plan.        Cognition and Memory: Cognition and memory normal.        Judgment: Judgment normal.     Comments: Insight intact Mood is mildly anxious and depressed. Affect brighter compared to past exams     Lab Review:     Component Value Date/Time   NA 132 (L) 02/26/2023 0515   K 4.0 02/26/2023 0515   CL 103 02/26/2023 0515   CO2 22 02/26/2023 0515   GLUCOSE 127 (H) 02/26/2023 0515   BUN 14 02/26/2023 0515   CREATININE 0.81 02/26/2023 0515   CREATININE 0.72 07/31/2014 1222   CALCIUM 9.1 02/26/2023 0515   PROT 7.5 07/31/2014 1222   ALBUMIN 4.3 07/31/2014 1222   AST 21 07/31/2014 1222   ALT 17 07/31/2014 1222   ALKPHOS 54 07/31/2014 1222   BILITOT 0.6 07/31/2014 1222   GFRNONAA >60 02/26/2023 0515       Component Value Date/Time   WBC 3.6 (L) 02/26/2023 0515   RBC 4.66 02/26/2023 0515   HGB 13.2 02/26/2023 0515   HCT 39.9 02/26/2023 0515   PLT 169 02/26/2023 0515   MCV 85.6 02/26/2023 0515   MCV 86.6 07/31/2014 1222   MCH 28.3 02/26/2023 0515   MCHC 33.1 02/26/2023 0515   RDW 12.7 02/26/2023 0515   LYMPHSABS 1,887 09/29/2018 0951   EOSABS 122 09/29/2018 0951   BASOSABS 30 09/29/2018 0951    No results found for: "POCLITH", "LITHIUM"   No results found for: "PHENYTOIN", "PHENOBARB", "VALPROATE", "CBMZ"   .res Assessment: Plan:    31 minutes spent dedicated to the care of this patient on the date of this encounter to  include pre-visit review of records, ordering of medication, post visit documentation, and face-to-face time with the patient discussing recent mood and anxiety symptoms. She reports that overall depression and anxiety symptoms have been manageable. Discussed ways that she may prepare for future events to possibly reduce her anxiety. Will continue Cymbalta 30 mg daily for depression and anxiety.  Recommend continuing therapy with Rockne Menghini, LCSW.  Pt to follow-up with this provider in 6 weeks or sooner if clinically indicated.  Patient advised to contact office with any questions, adverse effects, or acute  worsening in signs and symptoms.   Lundon was seen today for follow-up.  Diagnoses and all orders for this visit:  Mild episode of recurrent major depressive disorder (HCC) -     DULoxetine (CYMBALTA) 30 MG capsule; Take 1 capsule (30 mg total) by mouth daily.  Anxiety disorder, unspecified type -     DULoxetine (CYMBALTA) 30 MG capsule; Take 1 capsule (30 mg total) by mouth daily.     Please see After Visit Summary for patient specific instructions.  Future Appointments  Date Time Provider Department Center  09/03/2023  3:00 PM Mathis Fare, LCSW [facility] None  09/24/2023  2:15 PM Corie Chiquito, PMHNP [facility] None  01/23/2024  2:30 PM Tat, Octaviano Batty, DO [facility] None  04/21/2024 11:45 AM Barron Alvine, CCC-SLP [facility]  04/21/2024 12:00 PM Gean Maidens, PT [facility]  04/21/2024 12:15 PM Hoxie, Brand Males, OT [facility]  07/06/2024  8:30 AM Rosann Auerbach, PhD [facility] None  07/06/2024  9:30 AM [facility] NEUROPSYCH TECH [facility] None  07/16/2024 10:00 AM Rosann Auerbach, PhD [facility] None    No orders of the defined types were placed in this encounter.   -------------------------------

## 2023-08-21 ENCOUNTER — Encounter: Payer: Self-pay | Admitting: Psychiatry

## 2023-08-28 ENCOUNTER — Telehealth: Payer: Self-pay

## 2023-08-28 NOTE — Telephone Encounter (Signed)
Patient called to let us know that since trying Rytary she has not noticed any difference. Patient said she still has Carbidopa Levodopa and wants to know if she should restart that medication or continue on the Rytary and get a RX sent in? Patient is on the 10/2/6  time period. She wanted to know if she misses a dose of medication should she double up on the next dose or take it as soon as she realizes she missed it even if it close tot he next dose

## 2023-08-29 NOTE — Telephone Encounter (Signed)
That is how she described it to me that she felt no difference from the carbidopa levodopa she was on and felt it was not worth it to take it unless you felt she needed to stay on it

## 2023-08-29 NOTE — Telephone Encounter (Signed)
Called pateint and she said she is sleeping all the time she can fall asleep at the drop of a hat at anytime day and night

## 2023-08-29 NOTE — Telephone Encounter (Signed)
Gave patient Dr. [name] recommendations and let her know to hold meds this weekend and to start CL 25/100 IR TID she had enough and I put I na remind me to cal her Monday morning

## 2023-08-30 ENCOUNTER — Ambulatory Visit: Payer: Medicare Other | Admitting: Psychiatry

## 2023-08-30 DIAGNOSIS — F321 Major depressive disorder, single episode, moderate: Secondary | ICD-10-CM | POA: Diagnosis not present

## 2023-08-30 DIAGNOSIS — D84821 Immunodeficiency due to drugs: Secondary | ICD-10-CM | POA: Diagnosis not present

## 2023-08-30 DIAGNOSIS — F419 Anxiety disorder, unspecified: Secondary | ICD-10-CM | POA: Diagnosis not present

## 2023-08-30 DIAGNOSIS — G20A1 Parkinson's disease without dyskinesia, without mention of fluctuations: Secondary | ICD-10-CM | POA: Diagnosis not present

## 2023-08-30 DIAGNOSIS — M85852 Other specified disorders of bone density and structure, left thigh: Secondary | ICD-10-CM | POA: Diagnosis not present

## 2023-08-30 DIAGNOSIS — Z0000 Encounter for general adult medical examination without abnormal findings: Secondary | ICD-10-CM | POA: Diagnosis not present

## 2023-08-30 DIAGNOSIS — Z905 Acquired absence of kidney: Secondary | ICD-10-CM | POA: Diagnosis not present

## 2023-08-30 DIAGNOSIS — Z79899 Other long term (current) drug therapy: Secondary | ICD-10-CM | POA: Diagnosis not present

## 2023-08-30 DIAGNOSIS — R7989 Other specified abnormal findings of blood chemistry: Secondary | ICD-10-CM | POA: Diagnosis not present

## 2023-08-30 DIAGNOSIS — M85851 Other specified disorders of bone density and structure, right thigh: Secondary | ICD-10-CM | POA: Diagnosis not present

## 2023-08-30 DIAGNOSIS — R413 Other amnesia: Secondary | ICD-10-CM | POA: Diagnosis not present

## 2023-09-02 ENCOUNTER — Telehealth: Payer: Self-pay | Admitting: Neurology

## 2023-09-02 NOTE — Telephone Encounter (Signed)
Pt states that she stop taking the Rytary on Saturday and needs to give the update to the nurse

## 2023-09-02 NOTE — Telephone Encounter (Signed)
Called patient back and she stated on Saturday that she still felt sleepy and foggy headed an tremors ere managed. Sunday she didn't feel as sleepy and the foggy head wasn't as bad but tremors were worse . Patient also saw her PCP and they told her there is no problem her taking B6 or multivitamins containing B6 I told patient while I appreciate the PCP telling her this Dr. Arbutus Leas her parkinson's doctor told me that she needs to stay away from B6 and vitamins containing  B6. Just verifying this is correct and I am not making a mistake in this vitamin

## 2023-09-03 ENCOUNTER — Ambulatory Visit: Payer: Medicare Other | Admitting: Psychiatry

## 2023-09-03 ENCOUNTER — Other Ambulatory Visit: Payer: Self-pay

## 2023-09-03 DIAGNOSIS — G20A1 Parkinson's disease without dyskinesia, without mention of fluctuations: Secondary | ICD-10-CM

## 2023-09-03 DIAGNOSIS — F330 Major depressive disorder, recurrent, mild: Secondary | ICD-10-CM

## 2023-09-03 MED ORDER — CARBIDOPA-LEVODOPA 25-100 MG PO TABS
1.0000 | ORAL_TABLET | Freq: Three times a day (TID) | ORAL | Status: DC
Start: 2023-09-03 — End: 2023-10-28

## 2023-09-03 NOTE — Telephone Encounter (Signed)
We held her IR and rytary and pt still sleepy "foggy"/sleepy.  Tell her its not the levodopa.  She can go back to IR dosing, 1 po tid.  I will likely increase next visit BUT she needs to know its not the carbidopa/levodopa making her sleepy

## 2023-09-03 NOTE — Progress Notes (Signed)
[facility] Counselor/Therapist Progress Note  Patient ID: Andrea Burke, MRN: [id number],    Date: 09/03/2023  Time Spent: 53 minutes   Treatment Type: Individual Therapy  Reported Symptoms: anxiety, depression, some tearfulness re: personal and family   Mental Status Exam:  Appearance:   Casual     Behavior:  Appropriate, Sharing, and Motivated  Motor:  Normal  Speech/Language:   Clear and Coherent  Affect:  Depressed and anxious  Mood:  anxious and depressed  Thought process:  goal directed  Thought content:    Rumination  Sensory/Perceptual disturbances:    WNL  Orientation:  oriented to person, place, time/date, situation, day of week, month of year, year, and stated date of Nov. 26, 2024  Attention:  Good  Concentration:  Good and Fair  Memory:  WNL  Fund of knowledge:   Good  Insight:    Fair  Judgment:   Good  Impulse Control:  Good   Risk Assessment: Danger to Self:  No Self-injurious Behavior: No Danger to Others: No Duty to Warn:no Physical Aggression / Violence:No  Access to Firearms a concern: No  Gang Involvement:No   Subjective: Patient in session today reporting symptoms of anxiety, depression, some tearfulness re: personal and family issues. Dealing with uncertainty and some fearfulness re: husband's cancer and her own medical/emotional concerns. Worries, and did well today in talking through some of her fears and worries with her diagnosis of Parkinson's disease. Actually did quite well with this sharing about certain ways of walking that can help her not trip up. Discusses other strategies that help her physically, and we also looked at some helpful coping strategies as she continues to adjust and accept her Parkinson's diagnosis. Concerned about some of her memory, but "some days are better than others".  Looked at "what helps me cope and what does not help" and trying to be more mindful of this each day. Tearfulness ended by close of session.  Mood some better upon leaving session today including some smiling.  Continued to remind her of being careful going down steps. Also encouraged to continue the PT exercises she did while in PT, and is following through on this.   Interventions: Cognitive Behavioral Therapy and Ego-Supportive  Long-Term goal: (measurable)  Enhance the ability to handle effectively the full variety of life's anxieties. Patient will eventually rate herself as a "3 or under on a 1-10 scale for depression and for anxiety, for a period of at least 2 months."  Strategies:  Patient will work on re-framing thoughts that have been leading her to feel more anxious.  The re-framed thoughts will support more calmness and personal empowerment for patient.     Short Term goal:  Increase understanding of beliefs and messages that produce worry and anxiety. Strategies: Patient will work with therapist to gain more understanding of how anxiety and worry are supported by certain beliefs and messages.  She will be able to give at least 3 examples of such messages in an upcoming session.  Diagnosis:   ICD-10-CM   1. Mild episode of recurrent major depressive disorder (HCC)  F33.0      Plan: Patient today working well in session and focusing on her anxiety, depression, fearful thoughts that are related to her diagnosis with Parkinson's disease and also her husband's physical challenges with cancer issues, and stress of her "the world situation".  She is continuing to follow-up on recommendations by her doctor as well as in sessions here  at our office.  Patient is showing progress and able to interrupt herself at times when she catches herself "being negative or assuming the worst."  Trying to support her husband as he is coping with his cancer diagnosis and appropriate treatments.  Encouraged patient to be practicing positive and self affirming behaviors as noted in sessions including: Trusting her ability to follow through on  recommendations from her neurologist regarding coping with Parkinson symptoms, being patient with herself, trying to refrain from assuming worst-case scenarios, asking for help when wanted or needed, limiting her watching of distressing news on TV or online as this accentuates her anxiety, maintain contact with people who are supportive, remain on her prescribed medications, and recognize the strength she shows when working with goal-directed behaviors to move in a direction that supports her improved emotional health and her outlook into the future.  She has shown progress over time and therapy and needs to continue her work currently with goal-directed behaviors to move in a forward and healthier direction as much as possible.  Goal review and progress/challenges noted with patient.  Next appointment within 3 to 4 weeks.   Mathis Fare, LCSW

## 2023-09-03 NOTE — Telephone Encounter (Signed)
Just to verify any med changes

## 2023-09-03 NOTE — Progress Notes (Deleted)
      [facility] Counselor/Therapist Progress Note  Patient ID: Andrea Burke, MRN: [id number],    Date: 09/03/2023  Time Spent: ***   Treatment Type: {[facility] AMB THERAPY [phone]}  Reported Symptoms: ***  Mental Status Exam:  Appearance:   {PSY:22683}     Behavior:  {PSY:21022743}  Motor:  {PSY:22302}  Speech/Language:   {PSY:22685}  Affect:  {PSY:22687}  Mood:  {PSY:31886}  Thought process:  {PSY:31888}  Thought content:    {PSY:[id number]}  Sensory/Perceptual disturbances:    {PSY:970-407-2321}  Orientation:  {PSY:30297}  Attention:  {PSY:22877}  Concentration:  {PSY:6626051675}  Memory:  {PSY:929 [id number] 6729}  Fund of knowledge:   {PSY:6626051675}  Insight:    {PSY:6626051675}  Judgment:   {PSY:6626051675}  Impulse Control:  {PSY:6626051675}   Risk Assessment: Danger to Self:  {PSY:22692} Self-injurious Behavior: {PSY:22692} Danger to Others: {PSY:22692} Duty to Warn:{PSY:311194} Physical Aggression / Violence:{PSY:21197} Access to Firearms a concern: {PSY:21197} Gang Involvement:{PSY:21197}  Subjective: ***   Interventions: {PSY:[id number]}  Diagnosis:No diagnosis found.  Plan: ***  Mathis Fare, LCSW

## 2023-09-03 NOTE — Telephone Encounter (Signed)
Took Rytary out of chart and put CL back in talked with patient and let her know that Dr. Arbutus Leas doesn't feel her sleepiness is coming from the levodopa

## 2023-09-24 ENCOUNTER — Encounter: Payer: Self-pay | Admitting: Psychiatry

## 2023-09-24 ENCOUNTER — Ambulatory Visit (INDEPENDENT_AMBULATORY_CARE_PROVIDER_SITE_OTHER): Payer: Medicare Other | Admitting: Psychiatry

## 2023-09-24 DIAGNOSIS — F419 Anxiety disorder, unspecified: Secondary | ICD-10-CM | POA: Diagnosis not present

## 2023-09-24 DIAGNOSIS — F330 Major depressive disorder, recurrent, mild: Secondary | ICD-10-CM | POA: Diagnosis not present

## 2023-09-24 MED ORDER — DULOXETINE HCL 30 MG PO CPEP: 30.0000 mg | ORAL_CAPSULE | Freq: Every day | ORAL | 1 refills | Status: DC

## 2023-09-24 NOTE — Progress Notes (Signed)
Andrea Burke [id number] 1946-08-19 77 y.o.  Subjective:   Patient ID:  Andrea Burke is a 77 y.o. (DOB 1946/02/15) female.  Chief Complaint:  Chief Complaint  Patient presents with   Depression   Anxiety    HPI Andrea Burke presents to the office today for follow-up of depression and anxiety. She reports that they were getting out to the gym a few times a week and have not been able to go as often as the holidays approach and husband having some "small falls." She reports worry about "what's going to happen next." She reports some increase in anxiety with the holidays. She reports some challenges with trying to get ready for Christmas. Some increased depression. Lower energy and motivation. Reports that she is not very productive during the day. Reports looking forward to Christmas less than usual. She reports that she is falling asleep without difficulty. She is taking naps in the afternoon. Reports getting a HA if she does not take a nap. She reports that her husband has told her she is screaming more in her sleep. She does not recall nightmares. Appetite has been decreased. Eating small amounts during the day. She reports poor concentration and focus. She reports difficulty with memory. Denies SI.   She reports that family gatherings are usually 25-30 people. There are now 11 children in the family. Most of her family now live in the [place] area. Husband is having health issues and "seems to be in a funk."   Past medication trials: Remeron- vivid dreams, increased appetite Lexapro-nightmares Sertraline-agitation, insomnia, impaired concentration Paxil-intolerable side effects Fluoxetine-intolerable side effects Viibryd- Partial improvement on 20 mg. Does not want to take doses above 20 mg. Cymbalta Wellbutrin- Severe tremors Lithium Hydroxyzine- has taken for itching.  Xanax Deplin Gabapentin- prescribed for pain   Mini-Mental    Flowsheet Row Office Visit from  11/18/2020 in [facility] Office Visit from 04/25/2020 in [facility]  Total Score (max 30 points ) 29 29      Flowsheet Row ED from 02/26/2023 in [facility] Emergency Department at [facility]  C-SSRS RISK CATEGORY No Risk        Review of Systems:  Review of Systems  Medications: I have reviewed the patient's current medications.  Current Outpatient Medications  Medication Sig Dispense Refill   Calcium Carbonate-Vitamin D (CALTRATE 600+D PO) Take by mouth.     carbidopa-levodopa (SINEMET IR) 25-100 MG tablet Take 1 tablet by mouth 3 (three) times daily.     clobetasol cream (TEMOVATE) 0.05 % Apply topically.     Clobetasol Propionate (TEMOVATE) 0.05 % external spray Apply to scalp twice daily x 2 wk, then daily x 2 wk, then every other day x 2 wk. Not to face.     DULoxetine (CYMBALTA) 30 MG capsule Take 1 capsule (30 mg total) by mouth daily. 90 capsule 1   finasteride (PROSCAR) 5 MG tablet Take 5 mg by mouth daily.     fish oil-omega-3 fatty acids 1000 MG capsule Take 2 g by mouth daily.     hydroxychloroquine (PLAQUENIL) 200 MG tablet Take by mouth daily.     No current facility-administered medications for this visit.    Medication Side Effects: None  Allergies:  Allergies  Allergen Reactions   Doxycycline Calcium    Penicillins     Past Medical History:  Diagnosis Date   Cancer (HCC)    KIDNEY   Discoid lupus  Endometriosis    Headache(784.0)    Insomnia    Kidney disease    Lupus    Osteopenia    Urinary incontinence     Past Medical History, Surgical history, Social history, and Family history were reviewed and updated as appropriate.   Please see review of systems for further details on the patient's review from today.   Objective:   Physical Exam:  There were no vitals taken for this visit.  Physical Exam Constitutional:      General: She is not in acute  distress. Musculoskeletal:        General: No deformity.  Neurological:     Mental Status: She is alert and oriented to person, place, and time.     Coordination: Coordination normal.  Psychiatric:        Attention and Perception: Attention and perception normal. She does not perceive auditory or visual hallucinations.        Mood and Affect: Mood is anxious and depressed. Affect is not labile, blunt, angry or inappropriate.        Speech: Speech normal.        Behavior: Behavior normal.        Thought Content: Thought content normal. Thought content is not paranoid or delusional. Thought content does not include homicidal or suicidal ideation. Thought content does not include homicidal or suicidal plan.        Cognition and Memory: Cognition and memory normal.        Judgment: Judgment normal.     Comments: Insight intact     Lab Review:     Component Value Date/Time   NA 132 (L) 02/26/2023 0515   K 4.0 02/26/2023 0515   CL 103 02/26/2023 0515   CO2 22 02/26/2023 0515   GLUCOSE 127 (H) 02/26/2023 0515   BUN 14 02/26/2023 0515   CREATININE 0.81 02/26/2023 0515   CREATININE 0.72 07/31/2014 1222   CALCIUM 9.1 02/26/2023 0515   PROT 7.5 07/31/2014 1222   ALBUMIN 4.3 07/31/2014 1222   AST 21 07/31/2014 1222   ALT 17 07/31/2014 1222   ALKPHOS 54 07/31/2014 1222   BILITOT 0.6 07/31/2014 1222   GFRNONAA >60 02/26/2023 0515       Component Value Date/Time   WBC 3.6 (L) 02/26/2023 0515   RBC 4.66 02/26/2023 0515   HGB 13.2 02/26/2023 0515   HCT 39.9 02/26/2023 0515   PLT 169 02/26/2023 0515   MCV 85.6 02/26/2023 0515   MCV 86.6 07/31/2014 1222   MCH 28.3 02/26/2023 0515   MCHC 33.1 02/26/2023 0515   RDW 12.7 02/26/2023 0515   LYMPHSABS 1,887 09/29/2018 0951   EOSABS 122 09/29/2018 0951   BASOSABS 30 09/29/2018 0951    No results found for: "POCLITH", "LITHIUM"   No results found for: "PHENYTOIN", "PHENOBARB", "VALPROATE", "CBMZ"   .res Assessment: Plan:    33  minutes spent dedicated to the care of this patient on the date of this encounter to include pre-visit review of records, ordering of medication, post visit documentation, and face-to-face time with the patient discussing recent increase in sad mood and anxiety. She reports worsening symptoms are likely related to holidays and her and husband's health issues. Discussed treatment options for depression and anxiety, to include re-attempting increase of Cymbalta. Pt reports that she prefers not to make changes in medications at this time since she had increased fatigue in the past with increase in Cymbalta. She reports that she would like to continue Cymbalta 30  mg daily for depression and anxiety.  Will transfer care to Melony Overly, PA since this provider is leaving the practice.  Pt to follow-up with this provider in 4 weeks or sooner if clinically indicated.  Patient advised to contact office with any questions, adverse effects, or acute worsening in signs and symptoms. Recommend continuing therapy with Rockne Menghini, LCSW.     Andrea Burke was seen today for depression and anxiety.  Diagnoses and all orders for this visit:  Mild episode of recurrent major depressive disorder (HCC) -     DULoxetine (CYMBALTA) 30 MG capsule; Take 1 capsule (30 mg total) by mouth daily.  Anxiety disorder, unspecified type -     DULoxetine (CYMBALTA) 30 MG capsule; Take 1 capsule (30 mg total) by mouth daily.     Please see After Visit Summary for patient specific instructions.  Future Appointments  Date Time Provider Department Center  10/07/2023  4:00 PM Mathis Fare, [place] [facility] None  11/07/2023 11:00 AM Melony Overly T, PA-C [facility] None  01/23/2024  2:30 PM Tat, Octaviano Batty, DO [facility] None  04/21/2024 11:45 AM Barron Alvine, CCC-SLP [facility]  04/21/2024 12:00 PM Gean Maidens, PT [facility]  04/21/2024 12:15 PM Hoxie, Brand Males, OT [facility]  07/06/2024  8:30 AM Rosann Auerbach, PhD [facility] None   07/06/2024  9:30 AM [facility]- NEUROPSYCH TECH [facility] None  07/16/2024 10:00 AM Rosann Auerbach, PhD [facility] None    No orders of the defined types were placed in this encounter.   -------------------------------

## 2023-09-25 DIAGNOSIS — Z79899 Other long term (current) drug therapy: Secondary | ICD-10-CM | POA: Diagnosis not present

## 2023-09-25 DIAGNOSIS — Z961 Presence of intraocular lens: Secondary | ICD-10-CM | POA: Diagnosis not present

## 2023-09-25 DIAGNOSIS — H524 Presbyopia: Secondary | ICD-10-CM | POA: Diagnosis not present

## 2023-09-25 DIAGNOSIS — H52203 Unspecified astigmatism, bilateral: Secondary | ICD-10-CM | POA: Diagnosis not present

## 2023-10-07 ENCOUNTER — Ambulatory Visit (INDEPENDENT_AMBULATORY_CARE_PROVIDER_SITE_OTHER): Payer: Medicare Other | Admitting: Psychiatry

## 2023-10-07 DIAGNOSIS — F330 Major depressive disorder, recurrent, mild: Secondary | ICD-10-CM | POA: Diagnosis not present

## 2023-10-07 NOTE — Progress Notes (Signed)
[facility] Counselor/Therapist Progress Note  Patient ID: Andrea Burke, MRN: [id number],    Date: 10/07/2023  Time Spent: 55 minutes   Treatment Type: Individual Therapy  Reported Symptoms: anxiety, depression, tearfulness re: family and personal/medical issues, forgetfulness   Mental Status Exam:  Appearance:   Casual and Neat     Behavior:  Appropriate, Sharing, and unmotivated  Motor:  Tremor and is in her hand, but the medication is helping some  Speech/Language:   Normal Rate  Affect:  Depressed and Tearful  Mood:  anxious, depressed, and sad  Thought process:  goal directed  Thought content:    WNL  Sensory/Perceptual disturbances:    WNL  Orientation:  oriented to person, place, time/date, situation, day of week, month of year, year, and stated date of Dec. 30, 2024  Attention:  Good  Concentration:  Fair  Memory:  Some forgetfulness   Fund of knowledge:   Good and Fair  Insight:    Fair  Judgment:   Fair  Impulse Control:  Fair   Risk Assessment: Danger to Self:  No Self-injurious Behavior: No Danger to Others: No Duty to Warn:no Physical Aggression / Violence:No  Access to Firearms a concern: No  Gang Involvement:No   Subjective:  Patient in session and continued her work on symptoms of depression, anxiety, and personal/family issues.States she feels depressed and sad mostly about her own medical issues with Parkinson's and her husband's cancer issues. Husband's next surgeries coming up Jan. 13/14. Patient processing her anxieties and worries and was able to feel some relief and also some recognition of how she is unintentionally "assuming worst case scenarios" she is making her depression and anxiety worse.  Through some of her worst-case scenarios and then worked with her on strategies that can help her not go down that path and instead keep herself in the present time, hoping for the best, and not making any firm assumptions, which patient added she  felt would be best for her.  Worked with several examples of this with patient and she seemed to connect well.  Did acknowledge that she had forgotten her medication a couple times so we talked about ways that she can be more sure of getting her medication on time and not forgetting it nor overtaking it.  Seem to feel more confident with that by session end.  Also less tearful, and less fearful at least in the moment.  Discussed ways with patient that she might manage the situation if/when those fears re-occur for her and she seemed to feel more confident, particularly the fears about her husband's upcoming surgery.  Mood was definitely better by end of session including some smiling.  Seems to feel a little more confident in herself and agreed to make sure she sets her medication out so that she can take it on time and also will follow-up more regularly with the PT exercises that she learned and that her doctor encourages.   Interventions: Cognitive Behavioral Therapy, Ego-Supportive, and Grief Therapy  Long-Term goal: (measurable)  Enhance the ability to handle effectively the full variety of life's anxieties. Patient will eventually rate herself as a "3 or under on a 1-10 scale for depression and for anxiety, for a period of at least 2 months."  Strategies:  Patient will work on re-framing thoughts that have been leading her to feel more anxious.  The re-framed thoughts will support more calmness and personal empowerment for patient.     Short Term  goal:  Increase understanding of beliefs and messages that produce worry and anxiety. Strategies: Patient will work with therapist to gain more understanding of how anxiety and worry are supported by certain beliefs and messages.  She will be able to give at least 3 examples of such messages in an upcoming session.  Diagnosis:   ICD-10-CM   1. Mild episode of recurrent major depressive disorder (HCC)  F33.0      Plan:  Patient in session today working  on her anxiety, depression.  She also continues to work with some fearful thoughts that are specifically related to her diagnosis with Parkinson's disease recently and also her husband's physical challenges with cancer issues and "our concerns about the world situation since the election."  Patient following through on strategies discussed in sessions including "catching herself when she is looking more for the negative and also tending to assume the worst".  Shares that she is able to assess those thoughts and be able to change them in a more realistic and hopeful direction.  She is very open and shares freely in session today.  Continues her support of her husband who is coping with cancer and treatment.  Encouraged patient in her continued practice of more self-affirming and positive behaviors as discussed in session including: Trying to refrain from assuming worst-case scenarios, trusting her ability to follow through on recommendations from her neurologist regarding coping with Parkinson symptoms, being patient with herself, asking for help when needed or wanted, limiting her watching of distressing news on TV or online as this accentuates her anxiety, maintain contact with people who are supportive, remain on her prescribed medications, and realize the strength she shows when working with goal-directed behaviors to move in a direction that supports her improved emotional health and overall wellbeing.  Patient has definitely made progress in in therapy along with the help of her medication, and needs to continue her work on goal-directed behaviors to move in a more positive and hopeful direction.  Goal review and progress/challenges noted with patient.  Next appointment within 3-4 weeks.   Mathis Fare, LCSW

## 2023-10-28 ENCOUNTER — Other Ambulatory Visit: Payer: Self-pay | Admitting: Neurology

## 2023-10-28 DIAGNOSIS — G20A1 Parkinson's disease without dyskinesia, without mention of fluctuations: Secondary | ICD-10-CM

## 2023-10-31 ENCOUNTER — Ambulatory Visit: Payer: Medicare Other | Admitting: Psychiatry

## 2023-11-01 ENCOUNTER — Ambulatory Visit (INDEPENDENT_AMBULATORY_CARE_PROVIDER_SITE_OTHER): Payer: Medicare Other | Admitting: Psychiatry

## 2023-11-01 DIAGNOSIS — F419 Anxiety disorder, unspecified: Secondary | ICD-10-CM | POA: Diagnosis not present

## 2023-11-01 NOTE — Progress Notes (Signed)
[facility] Counselor/Therapist Progress Note  Patient ID: Andrea Burke, MRN: [id number],    Date: 11/01/2023  Time Spent: 53 minutes   Treatment Type: Individual Therapy  Reported Symptoms: anxiety, depression, some tearfulness [id number] and family   Mental Status Exam:  Appearance:   Casual and Neat     Behavior:  Appropriate, Sharing, and Motivated  Motor:  Right hand tremor; Parkinson's  Speech/Language:   Clear and Coherent  Affect:  Anxious, some depression  Mood:  anxious and depressed  Thought process:  goal directed  Thought content:    Some rumination mostly about health concerns for patient  and husband  Sensory/Perceptual disturbances:    WNL  Orientation:  oriented to person, place, time/date, situation, day of week, month of year, year, and stated date of Jan. 24, 2025  Attention:  Fair  Concentration:  Fair  Memory:  "Some forgetting, so being more careful with medications and doctor recommendations"  Fund of knowledge:   Good and Fair  Insight:    Good and Fair  Judgment:   Good and Fair  Impulse Control:  Good and Fair   Risk Assessment: Danger to Self:  No Self-injurious Behavior: No Danger to Others: No Duty to Warn:no Physical Aggression / Violence:No  Access to Firearms a concern: No  Gang Involvement:No   Subjective:  Patient today in session reporting her symptoms to be anxiety, depression, intermittent tearfulness that is usually related to personal, family, or world issues.  Denies any SI.  "Needing" to talk about "my worries" including feeling "caged up with the bad cold and snowy weather recently."  Did get out some yesterday and today and that felt good to her. Husband due to return to Dr next month. Patient to return for her medical follow up appt in April/May. Noticing the shakiness some "but I don't come close to falling." Does feel like having Parkinson's has "made the days a little tougher as it creates some uncertainty as to how  I am and how I will be."  Working further also today on family stressors as others close to her are also having some medical concerns. More concerns about her husband's cancer also shared and discussed with patient as this has been stressing her as well.  Did seem to feel more leveled out emotionally and showing some strength, including stating that it helps her to be able to talk about these stressors more openly.  Continues to express concern about "the future" and what it may hold for she and her husband.  Does well and talking about this openly in sessions and I think that is helpful for her, and she is typically always able to be aware also of some positives for she and her husband.  Encouraged her contact more with both relatives and friends especially during this time of winter weather when she is not getting out quite as much.  She continues to be very aware of "what helps me and what does not help me".  Smiling a little more at times today.  To return in approximately 3 weeks and can call if she needs to before that time.  Encouraged her in her continued use of the PT exercises and other coping strategies discussed previously.  Good follow-through on her report.   Interventions: Cognitive Behavioral Therapy and Ego-Supportive  Long-Term goal: (measurable)  Enhance the ability to handle effectively the full variety of life's anxieties. Patient will eventually rate herself as a "3 or under on  a 1-10 scale for depression and for anxiety, for a period of at least 2 months."  Strategies:  Patient will work on re-framing thoughts that have been leading her to feel more anxious.  The re-framed thoughts will support more calmness and personal empowerment for patient.     Short Term goal:  Increase understanding of beliefs and messages that produce worry and anxiety. Strategies: Patient will work with therapist to gain more understanding of how anxiety and worry are supported by certain beliefs and  messages.  She will be able to give at least 3 examples of such messages in an upcoming session.  Diagnosis:   ICD-10-CM   1. Anxiety disorder, unspecified type  F41.9      Plan:  Patient today in session and working further on her anxiety, depression, fearful thoughts that are related to her diagnosis with Parkinson's disease and also to her husband's physical challenges with cancer issues, "the stress of the world situation", but acknowledges her efforts to be more hopeful.  Continuing to follow-up on recommendations by her doctor and participates well in therapy sessions.  She is showing progress including catching herself "being negative or assuming the worst" and trying to change that in the moment. Encouraged patient to be practicing more of the positive and self affirming behaviors we have worked on in sessions including: Being patient with herself, trying to refrain from assuming worst-case scenarios, asking for help when wanted or needed, trusting her ability to follow through on recommendations from her neurologist regarding coping with Parkinson symptoms, limiting her watching of distressing news on TV or online as this accentuates her anxiety, maintain contact with people who are supportive, stay on her prescribed medications, and realize the strength she shows when working with goal-directed behaviors to move in a direction that supports her improved emotional health and her outlook into the future.  Patient continues to put forth effort, is showing progress, and needs to continue her work with goal-directed behaviors to move in a healthier and more hopeful direction as much as she is able.  Goal review and progress/challenges noted with patient.  Next appointment within 3 to 4 weeks.   Mathis Fare, LCSW

## 2023-11-07 ENCOUNTER — Encounter: Payer: Self-pay | Admitting: Physician Assistant

## 2023-11-07 ENCOUNTER — Ambulatory Visit (INDEPENDENT_AMBULATORY_CARE_PROVIDER_SITE_OTHER): Payer: Medicare Other | Admitting: Physician Assistant

## 2023-11-07 VITALS — BP 124/67 | HR 68

## 2023-11-07 DIAGNOSIS — F330 Major depressive disorder, recurrent, mild: Secondary | ICD-10-CM

## 2023-11-07 DIAGNOSIS — F419 Anxiety disorder, unspecified: Secondary | ICD-10-CM | POA: Diagnosis not present

## 2023-11-07 MED ORDER — AMPHETAMINE-DEXTROAMPHETAMINE 5 MG PO TABS: 2.5000 mg | ORAL_TABLET | Freq: Every morning | ORAL | 0 refills | Status: DC

## 2023-11-07 NOTE — Progress Notes (Signed)
[facility] Med Check  Patient ID: Andrea Burke,  MRN: [id number]  PCP: Catha Gosselin, MD  Date of Evaluation: 11/07/2023 Time spent:30 minutes  Chief Complaint:  Chief Complaint   Depression; Anxiety    HISTORY/CURRENT STATUS: HPI Transferring to my care from Corie Chiquito, NP, who is no longer with the practice.  Falling asleep easily, naps a lot. "If I stop, I'm falling asleep." Gets about 8-10 hours of sleep in a 24-hour period.  Feels really tired all the time.  Sluggish.  Sx started in the fall, after Parkinson's dx and was started on Carbi/Levo.   Feels sad, like everything is going wrong with her life. Her husband's health isn't good, he has penile CA, 4 surgeries in the past year. her 18 yo mothers worries her. She doesn't get to see her son and grandkids often, they all live in the [place] area.  She does cry sometimes but there is usually a reason.  ADLs and personal hygiene are normal.   Denies any changes in concentration, making decisions, or remembering things.  Appetite has not changed.  Weight is stable.  She feels anxious at times but is able to cope with things and get through it without much of a problem.  Denies suicidal or homicidal thoughts.  Patient denies increased energy with decreased need for sleep, increased talkativeness, racing thoughts, impulsivity or risky behaviors, increased spending, increased libido, grandiosity, increased irritability or anger, paranoia, or hallucinations.  Review of Systems  Constitutional:  Positive for malaise/fatigue.  HENT: Negative.    Eyes: Negative.   Respiratory: Negative.    Cardiovascular: Negative.   Gastrointestinal: Negative.   Genitourinary: Negative.   Musculoskeletal: Negative.   Skin: Negative.   Neurological: Negative.   Endo/Heme/Allergies: Negative.   Psychiatric/Behavioral:         See HPI   Individual Medical History/ Review of Systems: Changes? :No   Past medication trials: Remeron- vivid  dreams, increased appetite Lexapro-nightmares Sertraline-agitation, insomnia, impaired concentration Paxil-intolerable side effects Fluoxetine-intolerable side effects Viibryd- Partial improvement on 20 mg. Does not want to take doses above 20 mg. Cymbalta Wellbutrin- Severe tremors Lithium Hydroxyzine- has taken for itching.  Xanax Deplin Gabapentin- prescribed for pain  Allergies: Doxycycline calcium and Penicillins  Current Medications:  Current Outpatient Medications:    amphetamine-dextroamphetamine (ADDERALL) 5 MG tablet, Take 0.5-1 tablets (2.5-5 mg total) by mouth every morning. As needed., Disp: 30 tablet, Rfl: 0   carbidopa-levodopa (SINEMET IR) 25-100 MG tablet, Take 1 tablet by mouth 3 (three) times daily., Disp: 270 tablet, Rfl: 0   clobetasol cream (TEMOVATE) 0.05 %, Apply topically., Disp: , Rfl:    Clobetasol Propionate (TEMOVATE) 0.05 % external spray, Apply to scalp twice daily x 2 wk, then daily x 2 wk, then every other day x 2 wk. Not to face., Disp: , Rfl:    DULoxetine (CYMBALTA) 30 MG capsule, Take 1 capsule (30 mg total) by mouth daily., Disp: 90 capsule, Rfl: 1   finasteride (PROSCAR) 5 MG tablet, Take 5 mg by mouth daily., Disp: , Rfl:    hydroxychloroquine (PLAQUENIL) 200 MG tablet, Take by mouth daily., Disp: , Rfl:    Calcium Carbonate-Vitamin D (CALTRATE 600+D PO), Take by mouth. (Patient not taking: Reported on 11/07/2023), Disp: , Rfl:    fish oil-omega-3 fatty acids 1000 MG capsule, Take 2 g by mouth daily. (Patient not taking: Reported on 11/07/2023), Disp: , Rfl:  Medication Side Effects: none  Family Medical/ Social History: Changes? No  MENTAL HEALTH EXAM:  Blood pressure 124/67, pulse 68.There is no height or weight on file to calculate BMI.  General Appearance: Casual and Well Groomed  Eye Contact:  Good  Speech:  Clear and Coherent and Normal Rate  Volume:  Normal  Mood:   Sad  Affect:  Tearful  Thought Process:  Goal Directed and  Descriptions of Associations: Circumstantial  Orientation:  Full (Time, Place, and Person)  Thought Content: Logical   Suicidal Thoughts:  No  Homicidal Thoughts:  No  Memory:  WNL  Judgement:  Good  Insight:  Good  Psychomotor Activity:  Normal and without tremor  Concentration:  Concentration: Good  Recall:  Good  Fund of Knowledge: Good  Language: Good  Assets:  Communication Skills Desire for Improvement Financial Resources/Insurance Housing Transportation  ADL's:  Intact  Cognition: WNL  Prognosis:  Good   DIAGNOSES:    ICD-10-CM   1. Mild episode of recurrent major depressive disorder (HCC)  F33.0     2. Anxiety disorder, unspecified type  F41.9      Receiving Psychotherapy: Yes with Rockne Menghini, LCSW.  RECOMMENDATIONS:   PDMP reviewed.  No recent controlled substances. I provided 30 minutes of face to face time during this encounter, including time spent before and after the visit in records review, medical decision making, counseling pertinent to today's visit, and charting.   We discussed her symptoms.  She and Shanda Bumps have tried different doses of the Cymbalta and she has been on other meds in the past which either did not work or caused side effects that were not tolerable.  Even though she feels somewhat depressed and lethargic she does not want to change anything.  We discussed the fact that if nothing changes, then nothing will change.  I think adding a low dose stimulant will be beneficial.  She is hesitant to do so but is willing to try.  Discussed potential benefits, risks, and side effects of stimulants with patient to include increased heart rate, palpitations, insomnia, increased anxiety, increased irritability, or decreased appetite.  The patient understands and accepts these risks.  Instructed patient to contact office if experiencing any significant tolerability issues.  Recommend she see her primary provider, she may need labs or other evaluation.  Start  Adderall 5 mg, 1/2-1 every morning as needed. Continue Cymbalta 30 mg, 1 p.o. daily. Continue vitamins as per med list. Continue therapy with Rockne Menghini, LCSW. Return in 6 weeks.  Melony Overly, PA-C

## 2023-11-10 ENCOUNTER — Telehealth: Payer: Self-pay

## 2023-11-13 NOTE — Telephone Encounter (Signed)
 LVM to [facility]

## 2023-11-13 NOTE — Telephone Encounter (Signed)
 Prior Authorization submitted for generic Adderall 5 mg with [employer] FEP Program, denial received due to diagnosis needs to be ADHD/ADD or Narcolepsy confirmed by sleep study.   Using [facility] pt can get #30 for $17.00 at [facility]

## 2023-11-14 NOTE — Telephone Encounter (Signed)
 Insurance will not cover Adderall, needs dx of ADHD or narcolepsy.  Cost using [facility] for a 30-day supply is approximately $18 and patient was advised. Patient is asking for an alternative.

## 2023-11-14 NOTE — Telephone Encounter (Signed)
 Modafinil is another option, 100 mg, 1/2-1 q am. #30 per [facility] is $19.63 at [facility]. Next cheapest is $21.92 at [facility]. She may only need the half pill so that will be approx $10 out of pocket per month. Insurance will not cover it.

## 2023-11-18 NOTE — Telephone Encounter (Signed)
 LVM to [facility]

## 2023-11-18 NOTE — Telephone Encounter (Signed)
 Pt said she would try the Adderall. She has a Rx available and will tell pharmacy that she will use [facility].

## 2023-11-26 ENCOUNTER — Ambulatory Visit: Payer: Medicare Other | Admitting: Psychiatry

## 2023-11-26 DIAGNOSIS — F330 Major depressive disorder, recurrent, mild: Secondary | ICD-10-CM

## 2023-11-26 NOTE — Progress Notes (Signed)
[facility] Counselor/Therapist Progress Note  Patient ID: Andrea Burke, MRN: [id number],    Date: 11/26/2023     Time Spent: 53 minutes   Treatment Type: Individual Therapy  Reported Symptoms: anxiety, depression, tearfulness, family/personal issues   Mental Status Exam:  Appearance:   Casual and Neat     Behavior:  Appropriate, Sharing, and Motivated  Motor:  Some hand issues when writing or using mouse on computer, some occasional stumbling but No Falls  Speech/Language:   Clear and Coherent  Affect:  Depressed and anxious  Mood:  anxious and depressed  Thought process:  goal directed  Thought content:    Rumination  Sensory/Perceptual disturbances:    WNL  Orientation:  oriented to person, place, time/date, situation, day of week, month of year, year, and stated date of Feb. 18, 2025  Attention:  Fair  Concentration:  Fair  Memory:  WNL  Fund of knowledge:   Good and Fair  Insight:    Good and Fair  Judgment:   Good and Fair  Impulse Control:  Good   Risk Assessment: Danger to Self:  No Self-injurious Behavior: No Danger to Others: No Duty to Warn:no Physical Aggression / Violence:No  Access to Firearms a concern: No  Gang Involvement:No   Subjective:   Patient reporting symptoms of depression, anxiety, tearfulness, family stress, and she reports "some days are better than others". Concerned about her Parkinson's and is noticing some increased emotional lability. Husband is also having some significant medical concerns that add to their fears and anxieties. Patient involved in Parkinson's exercise group and plans to get back into the gym as they are able. Working today on some of her fears, worrying, anxiety, depression, and family stressors. (Not all details included in this note due to patient privacy needs.) Denies any SI.  Did seem less stressed and less worried by end of session after venting a lot of her fears, anxieties, and worries.  Encouraging her and  her participation in some activities she is already doing for Parkinson's patients and some she may want to take advantage of into the future as noted with her doctor.  Family stressors also adding to patient's anxiety, including her husband's health care situation and uncertainty.  Does feel like she is walking more stable and has not had any falls since last appointment.  More leveled out emotionally by end of session and able to express a couple of comments about "my future" and some activities she would like to engage in.  Encouraged her to have more contact with friends and relatives by phone or in person as she is able based on winter weather reports currently for our area.  Smiling more towards end of session and acknowledges that she does know what seems to help her more and what does not help her and is working harder on not focusing on the negatives or letting her fear take over control".  Continues her PT exercises in addition to other strategies discussed in session.   Interventions: Cognitive Behavioral Therapy, Solution-Oriented/Positive Psychology, and Ego-Supportive  Long-Term goal: (measurable)  Enhance the ability to handle effectively the full variety of life's anxieties. Patient will eventually rate herself as a "3 or under on a 1-10 scale for depression and for anxiety, for a period of at least 2 months."  Strategies:  Patient will work on re-framing thoughts that have been leading her to feel more anxious.  The re-framed thoughts will support more calmness and personal  empowerment for patient.     Short Term goal:  Increase understanding of beliefs and messages that produce worry and anxiety. Strategies: Patient will work with therapist to gain more understanding of how anxiety and worry are supported by certain beliefs and messages.  She will be able to give at least 3 examples of such messages in an upcoming session.   Diagnosis:   ICD-10-CM   1. Mild episode of recurrent  major depressive disorder (HCC)  F33.0      Plan:    Patient today continued her work on her depression, anxiety, fearfulness, and stress "including the world situations".  Experience some tearfulness in session today as she talked about some of her fears but also did well and looking at some of her strengths and working on not focusing and so heavily on fear and recognize some of her strengths.  No falls and was walking fine today.  States that she still sometimes will catch herself being "negative or extra fearful" and tries to change her thoughts, although difficult.  Practiced this some in session with her today which she seemed to appreciate and hopes to be able to do the same thing when she is on her own at home.  Continues to work on not "assuming the worst" and noticing also her strengths. Reminded and encouraged patient to be practicing more positive and self affirming behaviors that we continue to work on in sessions including: Trying to refrain from assuming worst-case scenarios, asking for help when needed or wanted, being patient with herself, trusting her ability to follow through on recommendations from her neurologist regarding coping with Parkinson symptoms, limiting her watching of distressing news on TV or online as this accentuates her anxiety, maintain contact with people who are supportive, stay on her prescribed medications, and recognize the strength she shows when working with goal-directed behaviors to move in a direction that supports her improved emotional health and her outlook into the future.  This patient does put forth good effort in sessions and has shown some progress in her coping with difficult health concerns within herself and her husband.  She has shown progress and needs to continue working with goal-directed behaviors so as to move in a healthier and more hopeful direction as much as possible.  Goal review and progress/challenges noted with patient.  Next appointment  within 3 weeks.   Mathis Fare, LCSW

## 2023-12-18 ENCOUNTER — Ambulatory Visit: Payer: Medicare Other | Admitting: Psychiatry

## 2023-12-23 ENCOUNTER — Ambulatory Visit: Payer: Medicare Other | Admitting: Psychiatry

## 2023-12-23 DIAGNOSIS — F419 Anxiety disorder, unspecified: Secondary | ICD-10-CM

## 2023-12-23 NOTE — Progress Notes (Signed)
 [facility] Counselor/Therapist Progress Note  Patient ID: Andrea Burke, MRN: [id number],    Date: 12/23/2023  Time Spent:  53 minutes   Treatment Type: Individual Therapy  Reported Symptoms:  anxiety, depression, family/personal issues   Mental Status Exam:  Appearance:   Casual and Neat     Behavior:  Appropriate, Sharing, and Motivated  Motor:  "Occasional trip-ups in walking"  Speech/Language:   Clear and Coherent  Affect:  Depressed and anxious  Mood:  anxious and depressed  Thought process:  goal directed  Thought content:    Rumination  Sensory/Perceptual disturbances:    WNL  Orientation:  oriented to person, place, time/date, situation, day of week, month of year, year, and stated date of December 23, 2023  Attention:  Fair  Concentration:  Fair  Memory:  "Some memory issues and worse under stress"  Fund of knowledge:   Good and Fair  Insight:    Good and Fair  Judgment:   Good  Impulse Control:  Good   Risk Assessment: Danger to Self:  No Self-injurious Behavior: No Danger to Others: No Duty to Warn:no Physical Aggression / Violence:No  Access to Firearms a concern: No  Gang Involvement:No   Subjective:   Patient today reporting anxiety, depression, fears, tearfulness, family stressors. Husband is recuperating from his cancer surgery and hopeful that he will be ok. Less emotional lability since her last appt here. Extended family now know the issues medically with which patient and husband are dealing. Sleep is good. Working further today on her depression, anxiety, fears, worrying and family stressors. No SI. Continues in Parkinson's exercise group. Sharing today her sadness, anxiety, fears especially today in talking through her fears, anxiety, and sadness, which seemed to be what patient wanted and needed in session. Doesn't confide in a lot of people and tends to be private. Encourage patient in her contact with extended family and others, and in her  follow up on exercises specifically to help her Parkinson's. No further falls since last appt. Encouraged frequent contact with relatives and friends. With encouragement, patient worked well in session today, smiling a little more at times, trying to not overly focus on the negatives. Does continue her exercises which she knows should be helpful but sometimes her energy level is down. Encouraged to follow through on suggestions by her Parkinson's doctor and PT involved.  Interventions: Cognitive Behavioral Therapy and Ego-Supportive  Long-Term goal: (measurable)  Enhance the ability to handle effectively the full variety of life's anxieties. Patient will eventually rate herself as a "3 or under on a 1-10 scale for depression and for anxiety, for a period of at least 2 months."  Strategies:  Patient will work on re-framing thoughts that have been leading her to feel more anxious.  The re-framed thoughts will support more calmness and personal empowerment for patient.     Short Term goal:  Increase understanding of beliefs and messages that produce worry and anxiety. Strategies: Patient will work with therapist to gain more understanding of how anxiety and worry are supported by certain beliefs and messages.  She will be able to give at least 3 examples of such messages in an upcoming session.    Diagnosis:   ICD-10-CM   1. Anxiety disorder, unspecified type  F41.9      Plan:  Today patient comes for her appointment and continues working on her anxiety, fearfulness, depression, and stress related to her physical diagnosis of Parkinson's and also the stress  she feels regarding crises and world situations.  Did very well in talking through the stressful issues and seemed to really appreciate the support in the attention that she gets when she is here for her appointments.  Does try to interrupt her anxious thoughts at times and tries to refrain from not "assuming the worst".  Does notice her strengths  and also very aware of her fears some of which she worked on in session today.  Encouraged patient to practice more positive and self affirming behaviors that we work on in sessions including: Trying to refrain from assuming worst-case scenarios, asking for help when needed or wanted, being patient with herself, trusting her ability to follow through on recommendations from her neurologist regarding coping with Parkinson's symptoms, limiting her watching of distressing news on TV or online as this accentuates her anxiety, maintain contact with people who are supportive, remain on her prescribed medications, and realize the strength she shows when working with goal-directed behaviors to move in a direction that supports her improved emotional health and overall wellbeing.  This patient continues to show good effort in her sessions and has definitely made progress in her coping with difficult health concerns of her own and her husband's health concerns as well.  Patient has made progress and needs to continue working with goal-directed behaviors so as to keep moving in a healthier and more hopeful direction as much as possible into her future.  Goal review and progress/challenges noted with patient.   Next appt within 3 weeks.   Mathis Fare, LCSW

## 2023-12-27 ENCOUNTER — Ambulatory Visit: Payer: Medicare Other | Admitting: Physician Assistant

## 2024-01-10 ENCOUNTER — Encounter: Payer: Self-pay | Admitting: Physician Assistant

## 2024-01-10 ENCOUNTER — Ambulatory Visit (INDEPENDENT_AMBULATORY_CARE_PROVIDER_SITE_OTHER): Admitting: Physician Assistant

## 2024-01-10 DIAGNOSIS — F419 Anxiety disorder, unspecified: Secondary | ICD-10-CM

## 2024-01-10 DIAGNOSIS — F330 Major depressive disorder, recurrent, mild: Secondary | ICD-10-CM | POA: Diagnosis not present

## 2024-01-10 DIAGNOSIS — R5381 Other malaise: Secondary | ICD-10-CM

## 2024-01-10 NOTE — Progress Notes (Signed)
 [facility]  Patient ID: Andrea Burke,  MRN: [id number]  PCP: Glen Land, PA-C  Date of Evaluation: 01/10/2024 Time spent:25 minutes  Chief Complaint:  Chief Complaint   Follow-up    HISTORY/CURRENT STATUS: HPI For f/u after starting Adderall. Former pt of Roselyn Connor who is no longer with the practice. This is my 2nd visit with her.   Andrea Burke has only taken the Adderall a couple of times and didn't notice any difference in energy or mood.  She feels tired all the time. If she sits down for any length of time, she falls asleep. The Cymbalta  has been increased in the past and she wasn't able to tolerate it. Other antidepressants caused intolerable SE too. (See list below.) Her main complaint is the somnolence. She sleeps well at night.  No feelings of hopelessness.  She is sad when thinking about her husband and dx of cancer, and her fairly nex dx of Parkinsons.  ADLs and personal hygiene are normal.  Gets distracted sometimes. Memory 'isn't what it used to be' but no changes. Appetite has not changed.  Weight is stable. No PA. Gets overwhelmed sometimes.  Denies suicidal or homicidal thoughts.  Patient denies increased energy with decreased need for sleep, increased talkativeness, racing thoughts, impulsivity or risky behaviors, increased spending, increased libido, grandiosity, increased irritability or anger, paranoia, or hallucinations.  Denies dizziness, syncope, seizures, numbness, tingling, tremor, tics, unsteady gait, slurred speech, confusion. Denies muscle or joint pain, stiffness, or dystonia.  Individual Medical History/ Review of Systems: Changes? :No   Past medication trials: Remeron- vivid dreams, increased appetite Lexapro-nightmares Sertraline-agitation, insomnia, impaired concentration Paxil-intolerable side effects Fluoxetine-intolerable side effects Viibryd - Partial improvement on 20 mg. Does not want to take doses above 20  mg. Cymbalta  Wellbutrin- Severe tremors Lithium  Hydroxyzine- has taken for itching.  Xanax Deplin Gabapentin- prescribed for pain  Allergies: Doxycycline calcium and Penicillins  Current Medications:  Current Outpatient Medications:    clobetasol cream (TEMOVATE) 0.05 %, Apply topically., Disp: , Rfl:    Clobetasol Propionate (TEMOVATE) 0.05 % external spray, Apply to scalp twice daily x 2 wk, then daily x 2 wk, then every other day x 2 wk. Not to face., Disp: , Rfl:    DULoxetine  (CYMBALTA ) 30 MG capsule, Take 1 capsule (30 mg total) by mouth daily., Disp: 90 capsule, Rfl: 1   finasteride (PROSCAR) 5 MG tablet, Take 5 mg by mouth daily., Disp: , Rfl:    hydroxychloroquine (PLAQUENIL) 200 MG tablet, Take by mouth daily., Disp: , Rfl:    carbidopa -levodopa  (SINEMET  IR) 25-100 MG tablet, Take 1 tablet by mouth 3 (three) times daily., Disp: 270 tablet, Rfl: 1 Medication Side Effects: none  Family Medical/ Social History: Changes? No  MENTAL HEALTH EXAM:  There were no vitals taken for this visit.There is no height or weight on file to calculate BMI.  General Appearance: Casual and Well Groomed  Eye Contact:  Good  Speech:  Clear and Coherent and Normal Rate  Volume:  Normal  Mood:   sad  Affect:  Congruent  Thought Process:  Goal Directed and Descriptions of Associations: Circumstantial  Orientation:  Full (Time, Place, and Person)  Thought Content: Logical   Suicidal Thoughts:  No  Homicidal Thoughts:  No  Memory:  WNL  Judgement:  Good  Insight:  Good  Psychomotor Activity:  Normal  Concentration:  Concentration: Fair and Attention Span: Good  Recall:  Good  Fund of Knowledge: Good  Language: Good  Assets:  Desire for Improvement Financial Resources/Insurance Housing Transportation Vocational/Educational  ADL's:  Intact  Cognition: WNL  Prognosis:  Good   DIAGNOSES:    ICD-10-CM   1. Mild episode of recurrent major depressive disorder (HCC)  F33.0     2.  Anxiety disorder, unspecified type  F41.9     3. Malaise and fatigue  R53.81    R53.83      Receiving Psychotherapy: Yes with Reid Capuchin, LCSW.  RECOMMENDATIONS:   PDMP Reviewed.  Adderall filled 11/19/2023. I provided 25 minutes of face to face time during this encounter, including time spent before and after the visit in records review, medical decision making, counseling pertinent to today's visit, and charting.   An atypical antipsychotic such as Abilify, Rexulti, Vraylar may be helpful but they are contraindicated because of the Parkinson's.   She hasn't taken the Adderall enough times to know whether it's helpful with sx or not. I recommend trying it again, but because she has Parkinsons and is on Sinemet , I will check with her neurologist, Dr. Ivette Marks Tat concerning the timing of meds. Specifically is there any reason not to take the Adderall a few hours before the first dose of Sinemet , will it change efficacy of Sinemet ? She sometimes gets up at 7 and sometimes 10 AM.    Hold Adderall for now.  (See above.) Continue Cymbalta  30 mg, 1 p.o. daily. Continue therapy with Reid Capuchin, LCSW. Return in 2 months.  Andrea Slocumb, PA-C

## 2024-01-14 ENCOUNTER — Ambulatory Visit: Admitting: Psychiatry

## 2024-01-14 DIAGNOSIS — F419 Anxiety disorder, unspecified: Secondary | ICD-10-CM

## 2024-01-14 NOTE — Progress Notes (Deleted)
   Established Patient Office Visit  Subjective   Patient ID: Andrea Burke, female    DOB: [birth year]  Age: 78 y.o. MRN: [id number]  No chief complaint on file.   HPI  {History (Optional):23778}  ROS    Objective:     There were no vitals taken for this visit. {Vitals History (Optional):23777}  Physical Exam   No results found for any visits on 01/14/24.  {Labs (Optional):23779}  The 10-year ASCVD risk score (Arnett DK, et al., 2019) is: 15.1%    Assessment & Plan:   Problem List Items Addressed This Visit   None   No follow-ups on file.    Mathis Fare, LCSW

## 2024-01-14 NOTE — Progress Notes (Signed)
 [facility] Counselor/Therapist Progress Note  Patient ID: Andrea Burke, MRN: [id number],    Date: 01/14/2024  Time Spent: 50 minutes   Treatment Type: Individual Therapy  Reported Symptoms: anxiety, depression, "worries more than I used to worry about me and my husband".   Mental Status Exam:  Appearance:   Casual and Neat     Behavior:  Appropriate, Sharing, and Motivated  Motor:  Affected some by her Parkinson's  Speech/Language:   Clear and Coherent  Affect:  Anxious, depressed  Mood:  anxious and depressed  Thought process:  goal directed  Thought content:    "A little bit of ruminating"  Sensory/Perceptual disturbances:    WNL  Orientation:  oriented to person, place, situation, day of week, and is sometimes losing track of dates, time, etc  Attention:  Fair  Concentration:  Fair  Memory:  Some memory (short term) issues  Fund of knowledge:   Good  Insight:    Fair  Judgment:   Good  Impulse Control:  Good   Risk Assessment: Danger to Self:  No Self-injurious Behavior: No Danger to Others: No Duty to Warn:no Physical Aggression / Violence:No  Access to Firearms a concern: No  Gang Involvement:No   Subjective:  Patient in session today reporting depression, anxiety, fears, family stressors. Husband reports doing better after recent cancer surgery. "Trying to cope with her Parkinson's disease as she is having some decreased energy, occasional "feeling unsure of myself "and walking weird at times as "legs don't feel in-sync" and other times"feels quite normal." Working today on the "emotional factor" that patient experiences along with some fears about the future and wondering how her Parkinson's might progress.  Did seem to feel heard in session and contributed to her mood being a little more hopeful as she also talked about her strengths in addition to some of her fears.  Feels fortunate that she does have very supportive family that also live in [place].  Trying to follow through on all of her doctors suggestions for her self-care.  Some fears and worrying but is doing well in sessions and venting those and talking through them which seems to lead her to feel more calm.  Encourage her to maintain her contact with extended family and friends, follow-up on exercises that are recommended to help her Parkinson's, be careful and walking around so as to hopefully prevent any falls in the future.  She continues to try not to overly focused on the negatives and the fears.  Able to notice some positives for herself.  Continues to follow the exercises that have been recommended to her.  Interventions: Cognitive Behavioral Therapy, Solution-Oriented/Positive Psychology, and Ego-Supportive  Long-Term goal: (measurable)  Enhance the ability to handle effectively the full variety of life's anxieties. Patient will eventually rate herself as a "3 or under on a 1-10 scale for depression and for anxiety, for a period of at least 2 months."  Strategies:  Patient will work on re-framing thoughts that have been leading her to feel more anxious.  The re-framed thoughts will support more calmness and personal empowerment for patient.     Short Term goal:  Increase understanding of beliefs and messages that produce worry and anxiety. Strategies: Patient will work with therapist to gain more understanding of how anxiety and worry are supported by certain beliefs and messages.  She will be able to give at least 3 examples of such messages in an upcoming session.   Diagnosis:  ICD-10-CM   1. Anxiety disorder, unspecified type  F41.9      Plan:  Patient today working further on her anxiety, some depression, fears, and stress related to her diagnosis of Parkinson's disease, her stress about world and political situations that are troublesome but tries not to dwell on them.  Works to interrupt anxious thoughts and not "assume worst-case scenarios".  Asks for help  when needed.  Following through on recommendations from her neurologist regarding her coping with Parkinson symptoms.  Limits her watching of distressing news on TV.  Encouraged to maintain contact with people who are supportive, remain on her prescribed medications, and recognize the strengths she shows as she works with goal-directed behaviors to move in a direction that supports her improved overall emotional health and her outlook into the future.  Andrea Burke has made progress and needs to keep working with goal-directed behaviors so as to move in a healthier and more hopeful direction as much as possible now and in the future.  Goal review and progress/challenges noted with patient.  Next appt within 3 weeks.   Andrea Fare, LCSW

## 2024-01-14 NOTE — Progress Notes (Deleted)
 [name] Kitchen

## 2024-01-22 NOTE — Progress Notes (Unsigned)
 Assessment/Plan:   1.  Parkinsons's disease  -much improved after starting levodopa.  She will continue carbidopa/levodopa 25/100, 1 tablet 3 times per day.  She initially thought it made her sleepy, and we tried to change her to Rytary.  She also thought that made her sleepy, so we stopped all Parkinson's medication and she remained sleepy.  Thereby, we concluded that it was not the medication making her sleepy and we ended up putting her back on immediate release levodopa, 1 tablet 3 times per day.             -Discussed increasing exercise.             -discussed Parkinsons Disease GeneRation study.  Father with Parkinsons Disease    2.  Memory loss, per patient             -I do not suspect neurodegenerative.             -I suspect pseudodementia from underlying depression.  -Patient is work-related we will schedule neurocognitive testing.   3.  Depression             -Currently seeing psychiatry [facility] and on Cymbalta.  Has tried and failed numerous antidepressants.  -She is following with counseling as well as the APP  -Adderrall added by APP for EDS in January, 2025 but she hasn't started it yet.  nPSG could be of value, but that cannot be done while on Adderall.  Subjective:   Andrea Burke was seen today in follow up for Parkinsons disease, diagnosed last visit.  My previous records were reviewed prior to todays visit as well as outside records available to me.  Patient is with her husband who supplements the history.  She was on levodopa and clinically doing markedly better.  She wanted to change to Rytary, however, she felt that the medication was making her sleepy.  We did that, and she ended up calling back and stating that she was still sleepy on the Rytary.  I asked her to hold all of her Parkinson's medication and see how she felt, and she reported that she still felt sleepy.  She has seen her psychiatry PA and talked to her about the sleepiness and they  decided to add Adderall.  She hasn't taken that yet.  Pt reports no falls.  She is doing [employer] class.  No other exercise except some yard work.  No hallucinations.  She admits that she takes levodopa tid but doesn't take it regular timing (off from 15 min to 2 hour intervals).  She feels a little off balance.    Current prescribed movement disorder medications: Carbidopa/levodopa 25/100, 1 tablet 3 times per day.   PREVIOUS MEDICATIONS: Sinemet; Rytary (she felt that she was the same degree of sleepy on Rytary that she was with IR levodopa, but then we held all Parkinson's medicines and she stated that she still felt sleepy, so that was not the medication)  Prior medications from psychiatry:  Mirtazapine (increased appetite, possibly dreaming);  Lexapro (nightmares); Sertraline-agitation, insomnia, impaired concentration Paxil-intolerable side effects Fluoxetine-intolerable side effects Viibryd- Partial improvement on 20 mg. Does not want to take doses above 20 mg. Cymbalta Wellbutrin- Severe tremors Lithium Hydroxyzine- has taken for itching.  Xanax Deplin Gabapentin- prescribed for pain  ALLERGIES:   Allergies  Allergen Reactions   Doxycycline Calcium    Penicillins     CURRENT MEDICATIONS:  Current Meds  Medication Sig   carbidopa-levodopa (  SINEMET IR) 25-100 MG tablet Take 1 tablet by mouth 3 (three) times daily.   clobetasol cream (TEMOVATE) 0.05 % Apply topically.   Clobetasol Propionate (TEMOVATE) 0.05 % external spray Apply to scalp twice daily x 2 wk, then daily x 2 wk, then every other day x 2 wk. Not to face.   DULoxetine (CYMBALTA) 30 MG capsule Take 1 capsule (30 mg total) by mouth daily.   finasteride (PROSCAR) 5 MG tablet Take 5 mg by mouth daily.   hydroxychloroquine (PLAQUENIL) 200 MG tablet Take by mouth daily.     Objective:   PHYSICAL EXAMINATION:    VITALS:   Vitals:   01/23/24 1434  BP: 118/72  Pulse: 71  SpO2: 98%  Weight: 133 lb 12.8 oz  (60.7 kg)     GEN:  The patient appears stated age and is in NAD. HEENT:  Normocephalic, atraumatic.  The mucous membranes are moist. The superficial temporal arteries are without ropiness or tenderness.  Neurological examination:  Orientation: The patient is alert and oriented x3.  Cranial nerves: There is good facial symmetry.  Extraocular muscles are intact. The visual fields are full to confrontational testing. The speech is fluent and clear. Soft palate rises symmetrically and there is no tongue deviation. Hearing is intact to conversational tone. Sensation: Sensation is intact to light touch throughout Motor: Strength is at least antigravity x 4  Movement examination: Tone: There is some trouble relaxing but overall good tone Abnormal movements: there is no tremor today Coordination:  There is only decremation with toe taps on the L Gait and Station: Patient ambulates well in the hall.  I have reviewed and interpreted the following labs independently    Chemistry      Component Value Date/Time   NA 132 (L) 02/26/2023 0515   K 4.0 02/26/2023 0515   CL 103 02/26/2023 0515   CO2 22 02/26/2023 0515   BUN 14 02/26/2023 0515   CREATININE 0.81 02/26/2023 0515   CREATININE 0.72 07/31/2014 1222      Component Value Date/Time   CALCIUM 9.1 02/26/2023 0515   ALKPHOS 54 07/31/2014 1222   AST 21 07/31/2014 1222   ALT 17 07/31/2014 1222   BILITOT 0.6 07/31/2014 1222       Lab Results  Component Value Date   WBC 3.6 (L) 02/26/2023   HGB 13.2 02/26/2023   HCT 39.9 02/26/2023   MCV 85.6 02/26/2023   PLT 169 02/26/2023    Lab Results  Component Value Date   TSH 1.45 09/29/2018     Cc:  Emilie Harden, Clance Crigler, PA-C

## 2024-01-23 ENCOUNTER — Encounter: Payer: Self-pay | Admitting: Neurology

## 2024-01-23 ENCOUNTER — Ambulatory Visit (INDEPENDENT_AMBULATORY_CARE_PROVIDER_SITE_OTHER): Payer: Medicare Other | Admitting: Neurology

## 2024-01-23 DIAGNOSIS — L658 Other specified nonscarring hair loss: Secondary | ICD-10-CM | POA: Diagnosis not present

## 2024-01-23 DIAGNOSIS — G20A1 Parkinson's disease without dyskinesia, without mention of fluctuations: Secondary | ICD-10-CM | POA: Diagnosis not present

## 2024-01-23 DIAGNOSIS — L299 Pruritus, unspecified: Secondary | ICD-10-CM | POA: Diagnosis not present

## 2024-01-23 DIAGNOSIS — L820 Inflamed seborrheic keratosis: Secondary | ICD-10-CM | POA: Diagnosis not present

## 2024-01-23 DIAGNOSIS — L932 Other local lupus erythematosus: Secondary | ICD-10-CM | POA: Diagnosis not present

## 2024-01-23 MED ORDER — CARBIDOPA-LEVODOPA 25-100 MG PO TABS
1.0000 | ORAL_TABLET | Freq: Three times a day (TID) | ORAL | 1 refills | Status: DC
Start: 2024-01-23 — End: 2024-08-13

## 2024-01-23 NOTE — Patient Instructions (Signed)
 Good to see you today!  No changes in your medication today.  The physicians and staff at [facility] are committed to providing excellent care. You may receive a survey requesting feedback about your experience at our office. We strive to receive "very good" responses to the survey questions. If you feel that your experience would prevent you from giving the office a "very good " response, please contact our office to try to remedy the situation. We may be reached at [phone]. Thank you for taking the time out of your busy day to complete the survey.

## 2024-02-06 ENCOUNTER — Ambulatory Visit: Admitting: Psychiatry

## 2024-02-06 DIAGNOSIS — F330 Major depressive disorder, recurrent, mild: Secondary | ICD-10-CM | POA: Diagnosis not present

## 2024-02-06 NOTE — Progress Notes (Signed)
 [facility] Counselor/Therapist Progress Note  Patient ID: Andrea Burke, MRN: [id number],    Date: 02/06/2024  Time Spent: 53 minutes   Treatment Type: Individual Therapy  Reported Symptoms: anxiety, depression, "worrying more about herself and husband's health"   Mental Status Exam:  Appearance:   Casual and Neat     Behavior:  Appropriate, Sharing, and Motivated  Motor:  Some motor skill issues with her Parkinson's  Speech/Language:   Clear and Coherent  Affect:  Depressed and anxious  Mood:  anxious and depressed  Thought process:  goal directed  Thought content:    Rumination  Sensory/Perceptual disturbances:    WNL  Orientation:  oriented to person, place, time/date, situation, day of week, month of year, year, and stated date of Feb 06, 2024  Attention:  Good  Concentration:  Fair  Memory:  Some short term memory issues  Fund of knowledge:   Good and Fair  Insight:    Fair  Judgment:   Good  Impulse Control:  Good   Risk Assessment: Danger to Self:  No Self-injurious Behavior: No Danger to Others: No Duty to Warn:no Physical Aggression / Violence:No  Access to Firearms a concern: No  Gang Involvement:No   Subjective:   Patient today in session and reporting symptoms of anxiety, depression, some fears re:she and husband, and family stressors that are difficult for her. Trying to be more hopeful for her and her husband. Does stress and "sometimes gets even more anxious with some depression but not as bad as it used to be".  Continues to try to cope with her Parkinson's disease as well as she can and certainly tries to follow through on all doctor recommendations and taking her medications as prescribed.  Worked on specific anxiety issues and some depression issues that are related to patient and her Parkinson's which seemed helpful to her.  She does note that the whole experience of having Parkinson's has made her a little more unsure of herself and is working  towards regaining some confidence and belief in herself.  But dissipates very well in session and listens and asks very good questions.  Continue to encourage her to maintain contact with extended family and friends as they are very supportive of patient and husband.  Also to follow-up on the exercises that are recommended to her through her Parkinson's doctors office.  Does notice some positives in spite of her fears and anxiety.  Seems to be managing some of her anxiety in healthier ways.  Interventions: Cognitive Behavioral Therapy and Ego-Supportive  Long-Term goal: (measurable)  Enhance the ability to handle effectively the full variety of life's anxieties. Patient will eventually rate herself as a "3 or under on a 1-10 scale for depression and for anxiety, for a period of at least 2 months."  Strategies:  Patient will work on re-framing thoughts that have been leading her to feel more anxious.  The re-framed thoughts will support more calmness and personal empowerment for patient.     Short Term goal:  Increase understanding of beliefs and messages that produce worry and anxiety. Strategies: Patient will work with therapist to gain more understanding of how anxiety and worry are supported by certain beliefs and messages.  She will be able to give at least 3 examples of such messages in an upcoming session.    Diagnosis:   ICD-10-CM   1. Mild episode of recurrent major depressive disorder (HCC)  F33.0      Plan:  Patient in session today focusing more on her anxiety, some depression, fears regarding her husband's health and her own health, and some stress related situations that she has needed to share and work on in sessions.  (Not all details included in this note due to patient privacy needs).  Still encouraged to limit her watching of distressing news on TV as that tends to exacerbate her anxiety, worries, and depression.  Encouraged patient to maintain contact with people who are  supportive, stay on her prescribed medications, and recognize the strength she continues to show as she works with goal-directed behaviors to move in a direction that supports her improved overall emotional health and outlook into her future.  Andrea Burke has made progress and needs to keep working with her goal-directed behaviors so as to move in a healthier and more hopeful direction as she continues to work on her therapy goals now and into the future.  Goal review and progress/challenges noted with patient.  Next appointment within 3 weeks.   [name], LCSW

## 2024-02-10 ENCOUNTER — Telehealth: Payer: Self-pay | Admitting: Physician Assistant

## 2024-02-10 NOTE — Telephone Encounter (Signed)
 Pt lvm that she would like to restart adderall  and give it a try. She would like to know how to wean off the duloxetine . Please call her with instructions at [phone]

## 2024-02-10 NOTE — Telephone Encounter (Signed)
 LVM to [facility]

## 2024-02-10 NOTE — Telephone Encounter (Signed)
 Last filled 2/11, 5 mg

## 2024-02-10 NOTE — Telephone Encounter (Signed)
 Pt filled Adderall in Feb, only but took a few days and stopped it because she didn't think it was helping.  She still has the medication and would like to restart it, but wanted to clarify how she was supposed to take it. Rx was for 5 mg, 0.5 to 1 tablet QAM prn. She is also asking to wean off duloxetine . Reports neurologist said it wasn't the Sinemet  that was making her sleepy. She does report benefit with the duloxetine  but can't tolerate the sleepiness.   Pharmacy - [facility] [address]  [address]

## 2024-02-10 NOTE — Telephone Encounter (Signed)
 I don't think it's wise to make 2 changes at once.  Have her restart the Adderall and we'll disc the Cymbalta  at our visit next month. Remind her to take the Adderall more than a few days, and if it's not effective, we can increase the dose. If she's not taking the cymbalta  at night, have her do so.

## 2024-02-11 NOTE — Telephone Encounter (Signed)
 LVM to [facility]

## 2024-02-11 NOTE — Telephone Encounter (Signed)
 Recommendations reviewed with patient. She had been taking Cymbalta  in the AM.

## 2024-02-27 DIAGNOSIS — M85851 Other specified disorders of bone density and structure, right thigh: Secondary | ICD-10-CM | POA: Diagnosis not present

## 2024-02-27 DIAGNOSIS — G20B1 Parkinson's disease with dyskinesia, without mention of fluctuations: Secondary | ICD-10-CM | POA: Diagnosis not present

## 2024-02-27 DIAGNOSIS — R5383 Other fatigue: Secondary | ICD-10-CM | POA: Diagnosis not present

## 2024-02-27 DIAGNOSIS — F321 Major depressive disorder, single episode, moderate: Secondary | ICD-10-CM | POA: Diagnosis not present

## 2024-02-27 DIAGNOSIS — E785 Hyperlipidemia, unspecified: Secondary | ICD-10-CM | POA: Diagnosis not present

## 2024-02-27 DIAGNOSIS — M85852 Other specified disorders of bone density and structure, left thigh: Secondary | ICD-10-CM | POA: Diagnosis not present

## 2024-03-04 ENCOUNTER — Ambulatory Visit: Admitting: Psychiatry

## 2024-03-04 DIAGNOSIS — F330 Major depressive disorder, recurrent, mild: Secondary | ICD-10-CM

## 2024-03-04 NOTE — Progress Notes (Signed)
 [facility] Counselor/Therapist Progress Note  Patient ID: Andrea Burke, MRN: [id number],    Date: 03/04/2024  Time Spent: 53 minutes  Treatment Type: Individual Therapy  Reported Symptoms: anxiety, depression, worrying about herself and her husband's health, states she's had some medication challenges   Mental Status Exam:  Appearance:   Casual and Neat     Behavior:  Appropriate, Sharing, and Motivated  Motor:  Parkinson's has affected her walking some and being careful to pick up her feet  Speech/Language:   Clear and Coherent  Affect:  Depressed and anxiety  Mood:  anxious and depressed  Thought process:  goal directed  Thought content:    Rumination  Sensory/Perceptual disturbances:    WNL  Orientation:  oriented to person, place, time/date, situation, day of week, month of year, year, and stated date of Mar 04, 2024  Attention:  Fair  Concentration:  Fair  Memory:  Some memory issues noted  Fund of knowledge:   Good  Insight:    Good and Fair  Judgment:   Good  Impulse Control:  Good   Risk Assessment: Danger to Self:  No Self-injurious Behavior: No Danger to Others: No Duty to Warn:no Physical Aggression / Violence:No  Access to Firearms a concern: No  Gang Involvement:No   Subjective:  Patient working well in session today and reports she is currently working with symptoms of anxiety, depression, and denies any thoughts of self-harm. Fears about her Parkinson's and being able to take care of herself, fear of losing her "functioning at home", fear of memory issues and losing touch with people. Also worries about "what is Andrea Burke goes first or what if I go first."  Worried about a 68 yr old grandson who fell in the yard while playing, and lacerated a kidney. Processed some fears and anxiety and noticing how her anxieties and fears can  "stack up" at times. Continues talking through additional fears particularly right now. Finds it helps to reflect on her 4  grandchildren and some of their behaviors that are amusing. Is working to be more hopeful about her and husband's health into the future, trying to follow Dr recommendations re: medication and recommended exercises/strategies for her Parkinson's disease.    Interventions: Cognitive Behavioral Therapy, Solution-Oriented/Positive Psychology, and Ego-Supportive Long-Term goal: (measurable)  Enhance the ability to handle effectively the full variety of life's anxieties. Patient will eventually rate herself as a "3 or under on a 1-10 scale for depression and for anxiety, for a period of at least 2 months."  Strategies:  Patient will work on re-framing thoughts that have been leading her to feel more anxious.  The re-framed thoughts will support more calmness and personal empowerment for patient.     Short Term goal:  Increase understanding of beliefs and messages that produce worry and anxiety. Strategies: Patient will work with therapist to gain more understanding of how anxiety and worry are supported by certain beliefs and messages.  She will be able to give at least 3 examples of such messages in an upcoming session.    Diagnosis:   ICD-10-CM   1. Mild episode of recurrent major depressive disorder (HCC)  F33.0      Plan:   Patient in session today working further on her anxiety, some depression, regarding her Parkinson's disease.  She worked well in session and is showing good follow-through in her medication and strategies to better manage Parkinson's.  Some tearfulness but also able to laugh at times  appropriately.  Also having some fears regarding her husband's health which is adding to her stress.  (Not all details included in this note due to patient privacy needs).  Continue to encourage patient to limit her watching of distressing news on TV as that tends to worsen her worrying, anxiety, and depression.  Good work in session today showing motivation and trying to have more determination.  Reminded patient to maintain contact with people who are supportive to her, remain on her prescribed medications, and recognize the strengths she continues to show as she works with goal-directed behaviors moving in a direction that supports her improved overall emotional health, her outlook into the future, and overall wellbeing.  Andrea Burke is making progress and needs to keep working with her goal-directed behaviors to move in a healthier and more hopeful direction as she continues to be motivated and working on her therapy goals now and in the future.  Goal review and progress/challenges noted with patient.  Next appointment within 3-4 weeks.   Andrea Patee, LCSW

## 2024-03-12 ENCOUNTER — Ambulatory Visit: Admitting: Physician Assistant

## 2024-03-12 ENCOUNTER — Encounter: Payer: Self-pay | Admitting: Physician Assistant

## 2024-03-12 DIAGNOSIS — F518 Other sleep disorders not due to a substance or known physiological condition: Secondary | ICD-10-CM | POA: Diagnosis not present

## 2024-03-12 DIAGNOSIS — F330 Major depressive disorder, recurrent, mild: Secondary | ICD-10-CM

## 2024-03-12 DIAGNOSIS — R5381 Other malaise: Secondary | ICD-10-CM

## 2024-03-12 MED ORDER — AMPHETAMINE-DEXTROAMPHETAMINE 5 MG PO TABS: 2.5000 mg | ORAL_TABLET | Freq: Every morning | ORAL | 0 refills | Status: DC

## 2024-03-12 NOTE — Progress Notes (Signed)
 [facility] Med Check  Patient ID: Andrea Burke,  MRN: [id number]  PCP: Glen Land, PA-C  Date of Evaluation: 03/12/2024 Time spent:30 minutes  Chief Complaint:  Chief Complaint   Depression    HISTORY/CURRENT STATUS: HPI for routine follow-up.  Andrea Burke started the Adderall 2.5 mg since our last visit.  She feels like it has helped "a little bit." Slightly more energetic for a few hours but that's all.  She usually gets up anytime between 9-11, she takes the Carbi/Levo earlier if/when she gets up to go to the [place]. She hasn't tried the Adderall 5 mg yet even though she could.  Still feels down a lot but not as severe and doesn't have as many days a week when she's really sad.  Sleeps a lot, often needs a nap around 1 PM.  Also has more dreams at night, that she can't always remember, but can remember they're sometimes scary. Her husband has said she sometimes cries out or hits him when she's dreaming but she doesn't remember it.  This has been going on for a year or so.  ADLs and personal hygiene are normal.   No change in memory.  Appetite has not changed.  Weight is stable.  She worries about the future with her illness as well as her husband's illness of cancer.  No panic attacks.  Denies suicidal or homicidal thoughts.  Patient denies increased energy with decreased need for sleep, increased talkativeness, racing thoughts, impulsivity or risky behaviors, increased spending, increased libido, grandiosity, increased irritability or anger, paranoia, or hallucinations.  Review of Systems  Constitutional:  Positive for malaise/fatigue.  HENT: Negative.    Eyes: Negative.   Respiratory: Negative.    Cardiovascular: Negative.   Gastrointestinal: Negative.   Genitourinary: Negative.   Musculoskeletal: Negative.   Skin: Negative.   Neurological:        See HPI and notes on chart  Endo/Heme/Allergies: Negative.   Psychiatric/Behavioral:         See HPI   Individual Medical  History/ Review of Systems: Changes? :Yes she saw Dr. Winferd Hatter for Parkinson's follow-up on 01/23/2024.  Past medication trials: Remeron- vivid dreams, increased appetite Lexapro-nightmares Sertraline-agitation, insomnia, impaired concentration Paxil-intolerable side effects Fluoxetine-intolerable side effects Viibryd - Partial improvement on 20 mg. Does not want to take doses above 20 mg. Cymbalta  Wellbutrin- Severe tremors Lithium  Hydroxyzine- has taken for itching.  Xanax Deplin Gabapentin- prescribed for pain  Allergies: Doxycycline calcium and Penicillins  Current Medications:  Current Outpatient Medications:    carbidopa -levodopa  (SINEMET  IR) 25-100 MG tablet, Take 1 tablet by mouth 3 (three) times daily., Disp: 270 tablet, Rfl: 1   clobetasol cream (TEMOVATE) 0.05 %, Apply topically., Disp: , Rfl:    Clobetasol Propionate (TEMOVATE) 0.05 % external spray, Apply to scalp twice daily x 2 wk, then daily x 2 wk, then every other day x 2 wk. Not to face., Disp: , Rfl:    DULoxetine  (CYMBALTA ) 30 MG capsule, Take 1 capsule (30 mg total) by mouth daily., Disp: 90 capsule, Rfl: 1   finasteride (PROSCAR) 5 MG tablet, Take 5 mg by mouth daily., Disp: , Rfl:    amphetamine -dextroamphetamine  (ADDERALL) 5 MG tablet, Take 0.5-1 tablets (2.5-5 mg total) by mouth every morning., Disp: 90 tablet, Rfl: 0   hydroxychloroquine (PLAQUENIL) 200 MG tablet, Take by mouth daily. (Patient not taking: Reported on 03/12/2024), Disp: , Rfl:  Medication Side Effects: none  Family Medical/ Social History: Changes? No  MENTAL HEALTH EXAM:  There were  no vitals taken for this visit.There is no height or weight on file to calculate BMI.  General Appearance: Casual and Well Groomed  Eye Contact:  Good  Speech:  Clear and Coherent and Normal Rate  Volume:  Normal  Mood:  Sad but brightens easily  Affect:  Congruent  Thought Process:  Goal Directed and Descriptions of Associations: Circumstantial  Orientation:   Full (Time, Place, and Person)  Thought Content: Logical   Suicidal Thoughts:  No  Homicidal Thoughts:  No  Memory:  WNL  Judgement:  Good  Insight:  Good  Psychomotor Activity:  Normal  Concentration:  Concentration: Fair and Attention Span: Good  Recall:  Good  Fund of Knowledge: Good  Language: Good  Assets:  Desire for Improvement Financial Resources/Insurance Housing Transportation Vocational/Educational  ADL's:  Intact  Cognition: WNL  Prognosis:  Good   DIAGNOSES:    ICD-10-CM   1. Malaise and fatigue  R53.81    R53.83     2. Mild episode of recurrent major depressive disorder (HCC)  F33.0     3. Abnormal dreams  F51.8       Receiving Psychotherapy: Yes with Reid Capuchin, LCSW.  RECOMMENDATIONS:   PDMP Reviewed.  Adderall filled 11/19/2023 I provided 30 minutes of face to face time during this encounter, including time spent before and after the visit in records review, medical decision making, counseling pertinent to today's visit, and charting.   We had a long discussion about the fatigue, drowsiness, depression, nightmares.  I think the Adderall is promising since she has noticed a slight improvement in energy.  She understands that I prescribed a very low dose on purpose.  I recommend increasing the dose.  She is willing to try it.  I am hopeful that will help with the fatigue.    She still seems depressed and increasing the Cymbalta  dose may benefit her.  She went up to 40 mg at 1 point and felt like it was causing drowsiness so the dose was decreased back to 30 mg.  I do not want to make too many changes at once, we will reevaluate the depression at the next visit.  As far as the increased dreams/nightmares goes, prazosin or doxazosin may be beneficial in the future but again I do not want to make too many changes at once.  Andrea Burke is very hesitant to take any medication to begin with and this symptom is not as disturbing to her as the depression and fatigue is.  We  discussed that Cymbalta  could be the culprit but we we will have to outweigh the benefits versus side effects.  Increase Adderall to 5 mg q am.  Continue Cymbalta  30 mg, 1 p.o. q evening.  Consider increasing to 40 mg at next OV.   Continue therapy with Reid Capuchin, LCSW. Return in 6 weeks.  Marvia Slocumb, PA-C

## 2024-03-19 ENCOUNTER — Ambulatory Visit: Payer: Medicare Other | Admitting: Occupational Therapy

## 2024-03-19 ENCOUNTER — Ambulatory Visit: Payer: Medicare Other | Admitting: Physical Therapy

## 2024-03-19 ENCOUNTER — Ambulatory Visit: Payer: Medicare Other

## 2024-03-19 ENCOUNTER — Encounter (HOSPITAL_COMMUNITY): Payer: Self-pay | Admitting: Emergency Medicine

## 2024-03-19 ENCOUNTER — Emergency Department (HOSPITAL_COMMUNITY)
Admission: EM | Admit: 2024-03-19 | Discharge: 2024-03-19 | Disposition: A | Discharge status: Home / Self Care | Attending: Emergency Medicine | Admitting: Emergency Medicine

## 2024-03-19 ENCOUNTER — Emergency Department (HOSPITAL_COMMUNITY)

## 2024-03-19 DIAGNOSIS — R0781 Pleurodynia: Secondary | ICD-10-CM

## 2024-03-19 DIAGNOSIS — R079 Chest pain, unspecified: Secondary | ICD-10-CM | POA: Diagnosis present

## 2024-03-19 DIAGNOSIS — R0789 Other chest pain: Secondary | ICD-10-CM | POA: Insufficient documentation

## 2024-03-19 DIAGNOSIS — M25551 Pain in right hip: Secondary | ICD-10-CM

## 2024-03-19 DIAGNOSIS — W19XXXA Unspecified fall, initial encounter: Secondary | ICD-10-CM | POA: Diagnosis not present

## 2024-03-19 DIAGNOSIS — I10 Essential (primary) hypertension: Secondary | ICD-10-CM | POA: Diagnosis not present

## 2024-03-19 DIAGNOSIS — G20C Parkinsonism, unspecified: Secondary | ICD-10-CM | POA: Insufficient documentation

## 2024-03-19 DIAGNOSIS — R0689 Other abnormalities of breathing: Secondary | ICD-10-CM | POA: Diagnosis not present

## 2024-03-19 LAB — CBC WITH DIFFERENTIAL/PLATELET
Abs Immature Granulocytes: 0.04 10*3/uL (ref 0.00–0.07)
Basophils Absolute: 0 10*3/uL (ref 0.0–0.1)
Basophils Relative: 0 %
Eosinophils Absolute: 0 10*3/uL (ref 0.0–0.5)
Eosinophils Relative: 1 %
HCT: 40.8 % (ref 36.0–46.0)
Hemoglobin: 13.5 g/dL (ref 12.0–15.0)
Immature Granulocytes: 1 %
Lymphocytes Relative: 14 %
Lymphs Abs: 1.1 10*3/uL (ref 0.7–4.0)
MCH: 29 pg (ref 26.0–34.0)
MCHC: 33.1 g/dL (ref 30.0–36.0)
MCV: 87.7 fL (ref 80.0–100.0)
Monocytes Absolute: 0.4 10*3/uL (ref 0.1–1.0)
Monocytes Relative: 6 %
Neutro Abs: 6.1 10*3/uL (ref 1.7–7.7)
Neutrophils Relative %: 78 %
Platelets: 155 10*3/uL (ref 150–400)
RBC: 4.65 MIL/uL (ref 3.87–5.11)
RDW: 12.5 % (ref 11.5–15.5)
WBC: 7.7 10*3/uL (ref 4.0–10.5)
nRBC: 0 % (ref 0.0–0.2)

## 2024-03-19 LAB — BASIC METABOLIC PANEL WITH GFR
Anion gap: 12 (ref 5–15)
BUN: 15 mg/dL (ref 8–23)
CO2: 20 mmol/L — ABNORMAL LOW (ref 22–32)
Calcium: 9.1 mg/dL (ref 8.9–10.3)
Chloride: 103 mmol/L (ref 98–111)
Creatinine, Ser: 0.63 mg/dL (ref 0.44–1.00)
GFR, Estimated: >60 mL/min (ref >60)
Glucose, Bld: 94 mg/dL (ref 70–99)
Potassium: 3.8 mmol/L (ref 3.5–5.1)
Sodium: 135 mmol/L (ref 135–145)

## 2024-03-19 MED ORDER — LIDOCAINE 5 % EX PTCH: 1.0000 | MEDICATED_PATCH | CUTANEOUS | 0 refills | Status: AC

## 2024-03-19 MED ORDER — LIDOCAINE 5 % EX PTCH
1.0000 | MEDICATED_PATCH | CUTANEOUS | Status: DC
  Administered 2024-03-19: 1 via TRANSDERMAL
  Filled 2024-03-19: qty 1

## 2024-03-19 MED ORDER — IBUPROFEN 200 MG PO TABS
600.0000 mg | ORAL_TABLET | Freq: Once | ORAL | Status: AC
  Administered 2024-03-19: 600 mg via ORAL
  Filled 2024-03-19: qty 3

## 2024-03-19 NOTE — ED Provider Notes (Signed)
 [place] EMERGENCY DEPARTMENT AT [facility] Provider Note   CSN: [id number] Arrival date & time: 03/19/24  1514     Patient presents with: Hip Pain   Andrea Burke is a 78 y.o. female with past medical history of parkinson's, immunodeficiency from immunosuppressive medication, single kidney presents emergency department for evaluation of right hip pain following a fall today.  She reports that she was trying to help her husband from falling when she accidentally slipped and fell onto her right side. Husband landed on top of her.  Denies head injury, LOC, complaints prior to fall   Hip Pain Pertinent negatives include no chest pain, no abdominal pain, no headaches and no shortness of breath.       Prior to Admission medications   Medication Sig Start Date End Date Taking? Authorizing Provider  lidocaine  (LIDODERM ) 5 % Place 1 patch onto the skin daily. Remove & Discard patch within 12 hours or as directed by MD 03/19/24  Yes Royann Cords, PA  amphetamine -dextroamphetamine  (ADDERALL) 5 MG tablet Take 0.5-1 tablets (2.5-5 mg total) by mouth every morning. 03/12/24   Marvia Slocumb T, PA-C  carbidopa -levodopa  (SINEMET  IR) 25-100 MG tablet Take 1 tablet by mouth 3 (three) times daily. 01/23/24   Tat, Von Grumbling, DO  clobetasol cream (TEMOVATE) 0.05 % Apply topically.    [provider]  Clobetasol Propionate (TEMOVATE) 0.05 % external spray Apply to scalp twice daily x 2 wk, then daily x 2 wk, then every other day x 2 wk. Not to face. 04/13/19   [provider]  DULoxetine  (CYMBALTA ) 30 MG capsule Take 1 capsule (30 mg total) by mouth daily. 09/24/23   Selinda Dales, PMHNP  finasteride (PROSCAR) 5 MG tablet Take 5 mg by mouth daily.    [provider]  hydroxychloroquine (PLAQUENIL) 200 MG tablet Take by mouth daily. Patient not taking: Reported on 03/12/2024    [provider]    Allergies: Doxycycline calcium and Penicillins    Review  of Systems  Constitutional:  Negative for chills, fatigue and fever.  Respiratory:  Negative for cough, chest tightness, shortness of breath and wheezing.   Cardiovascular:  Negative for chest pain and palpitations.  Gastrointestinal:  Negative for abdominal pain, constipation, diarrhea, nausea and vomiting.  Neurological:  Negative for dizziness, seizures, weakness, light-headedness, numbness and headaches.    Updated Vital Signs BP (!) 154/87   Pulse 62   Temp 97.6 F (36.4 C) (Oral)   Resp 18   SpO2 100%   Physical Exam Vitals and nursing note reviewed.  Constitutional:      General: She is not in acute distress.    Appearance: Normal appearance. She is not diaphoretic.  HENT:     Head: Normocephalic and atraumatic.     Comments: No hematoma nor TTP of cranium No crepitus to facial bones    Right Ear: External ear normal. No hemotympanum.     Left Ear: External ear normal. No hemotympanum.     Nose: Nose normal.     Right Nostril: No epistaxis or septal hematoma.     Left Nostril: No epistaxis or septal hematoma.     Mouth/Throat:     Mouth: Mucous membranes are moist. No injury or lacerations.   Eyes:     General: Lids are normal. Vision grossly intact. Gaze aligned appropriately. No visual field deficit.       Right eye: No discharge.        Left  eye: No discharge.     Extraocular Movements: Extraocular movements intact.     Right eye: Normal extraocular motion and no nystagmus.     Left eye: Normal extraocular motion and no nystagmus.     Conjunctiva/sclera: Conjunctivae normal.     Pupils: Pupils are equal, round, and reactive to light.     Comments: No subconjunctival hemorrhage, hyphema, tear drop pupil, or fluid leakage bilaterally  Neck:     Vascular: No carotid bruit.   Cardiovascular:     Rate and Rhythm: Normal rate.     Pulses: Normal pulses.          Radial pulses are 2+ on the right side and 2+ on the left side.       Dorsalis pedis pulses are 2+ on  the right side and 2+ on the left side.  Pulmonary:     Effort: Pulmonary effort is normal. No respiratory distress.     Breath sounds: Normal breath sounds. No wheezing.  Chest:     Chest wall: No tenderness.  Abdominal:     General: Bowel sounds are normal. There is no distension.     Palpations: Abdomen is soft.     Tenderness: There is no abdominal tenderness. There is no guarding or rebound.     Comments: No ecchymosis, distension, abdominal tenderness to light nor deep palpation   Musculoskeletal:     Cervical back: Full passive range of motion without pain, normal range of motion and neck supple. No deformity, rigidity or bony tenderness. Normal range of motion.     Thoracic back: No deformity or bony tenderness. Normal range of motion.     Lumbar back: No deformity or bony tenderness. Normal range of motion.     Right hip: No bony tenderness or crepitus.     Left hip: No bony tenderness or crepitus.     Right lower leg: No edema.     Left lower leg: No edema.     Comments: TTP of right hip and right posterior thorax. Pelvis appears stable No obvious deformity to joints or long bones No chest wall tenderness nor crepitus No shortening or rotation of LE bilaterally   Skin:    General: Skin is warm and dry.     Capillary Refill: Capillary refill takes less than 2 seconds.   Neurological:     General: No focal deficit present.     Mental Status: She is alert and oriented to person, place, and time. Mental status is at baseline.     GCS: GCS eye subscore is 4. GCS verbal subscore is 5. GCS motor subscore is 6.     Cranial Nerves: Cranial nerves 2-12 are intact. No cranial nerve deficit, dysarthria or facial asymmetry.     Sensory: Sensation is intact. No sensory deficit.     Motor: Motor function is intact. No weakness, tremor, seizure activity or pronator drift.     Coordination: Coordination is intact. Coordination normal. Finger-Nose-Finger Test and Heel to [facility] Test normal.      Deep Tendon Reflexes: Reflexes are normal and symmetric. Reflexes normal.     Comments: following commands appropriately. Sensation 2/2 of BUE and BLE. Motor 5/5 of BUE and BLE.    (all labs ordered are listed, but only abnormal results are displayed) Labs Reviewed  BASIC METABOLIC PANEL WITH GFR - Abnormal; Notable for the following components:      Result Value   CO2 20 (*)    All other components  within normal limits  CBC WITH DIFFERENTIAL/PLATELET    EKG: None  Radiology: DG Ribs Unilateral W/Chest Right Result Date: 03/19/2024 CLINICAL DATA:  Right posterior chest wall pain. EXAM: RIGHT RIBS AND CHEST - 3+ VIEW COMPARISON:  Chest radiograph dated 04/15/2017 FINDINGS: No focal consolidation, pleural effusion, or pneumothorax. The cardiac silhouette is within normal limits. No acute osseous pathology. No displaced rib fractures. IMPRESSION: 1. No acute cardiopulmonary process. 2. No displaced rib fractures. Electronically Signed   By: Angus Bark M.D.   On: 03/19/2024 18:44   DG Hip Unilat W or Wo Pelvis 2-3 Views Right Result Date: 03/19/2024 CLINICAL DATA:  Right hip pain EXAM: DG HIP (WITH OR WITHOUT PELVIS) 2-3V RIGHT COMPARISON:  None Available. FINDINGS: There is no evidence of hip fracture or dislocation. There is no evidence of arthropathy or other focal bone abnormality. IMPRESSION: Negative. Electronically Signed   By: Janeece Mechanic M.D.   On: 03/19/2024 17:12     Procedures   Medications Ordered in the ED  ibuprofen  (ADVIL ) tablet 600 mg (600 mg Oral Given 03/19/24 1640)                                    Medical Decision Making Amount and/or Complexity of Data Reviewed Labs: ordered. Radiology: ordered.  Risk OTC drugs. Prescription drug management.   Patient presents to the ED for concern of right hip and rib pain following fall, this involves an extensive number of treatment options, and is a complaint that carries with it a high risk of  complications and morbidity.  The differential diagnosis includes fracture, dislocation, contusion, strain   Co morbidities that complicate the patient evaluation  See HPI   Additional history obtained:  Additional history obtained from [facility]   External records from outside source obtained and reviewed including triage RN note, husband at bedside   Lab Tests:  I Ordered, and personally interpreted labs.  The pertinent results include:   BMP and CBC WNL   Imaging Studies ordered:  I ordered imaging studies including right rib XR and right hip XR  I independently visualized and interpreted imaging which showed no acute traumatic injury I agree with the radiologist interpretation    Medicines ordered and prescription drug management:  I ordered medication including ibuprofen , lido patch  for pain  Reevaluation of the patient after these medicines showed that the patient improved I have reviewed the patients home medicines and have made adjustments as needed    Problem List / ED Course:  Fall, initial encounter Right hip pain Right rib pain Denies head injury, LOC. No obvious injury to cranium on exam TTP of right hip and right lower posterior thorax. No crepitus or obvious instability or deformity. Fortunately both XR neg for displaced or obvious fracture No complaints of dizziness, lightheadedness. No complaints prior to today. No thinners Labs unremarkable Provided analgesia with some improvement to pain.    Reevaluation:  After the interventions noted above, I reevaluated the patient and found that they have :improved   Social Determinants of Health:  Has PCP   Dispostion:  After consideration of the diagnostic results and the patients response to treatment, I feel that the patent would benefit from outpatient management.  Discussed ED workup, disposition, return to ED precautions with patient who expresses understanding agrees with plan.   All questions answered to their satisfaction.  They are agreeable to plan.  Discharge instructions provided on paperwork  Final diagnoses:  Right hip pain  Rib pain on right side    ED Discharge Orders          Ordered    lidocaine  (LIDODERM ) 5 %  Every 24 hours        03/19/24 2003               Royann Cords, PA 03/21/24 1103    Arvilla Birmingham, MD 03/23/24 [phone]

## 2024-03-19 NOTE — ED Triage Notes (Signed)
 Pt brought in  by ems for right hip pain that radiates to flank area. Hx of sciatica

## 2024-03-19 NOTE — Discharge Instructions (Addendum)
 Thank you for letting us  evaluate you today.  Your chest and hip x-ray do not show any obvious fractures. Please use tylenol and ibuprofen intermittently every eight hours for pain  Please follow up with PCP.  I have also sent you home with inspiratory spirometer to use so that you do not get pneumonia in case you do have a rib fracture  Return to ED if you experience worsening pain, significant bruising to chest of abdomen

## 2024-03-20 ENCOUNTER — Telehealth: Payer: Self-pay | Admitting: Physician Assistant

## 2024-03-20 NOTE — Telephone Encounter (Signed)
 Prior approval effective 12/21/23-03/20/25

## 2024-03-20 NOTE — Telephone Encounter (Signed)
 Pt notified of approval

## 2024-03-20 NOTE — Telephone Encounter (Signed)
 Prior authorization pending with [facility] but will receive an approval within 24 hours

## 2024-03-20 NOTE — Telephone Encounter (Signed)
 Pt called and said that she got a letter from [employer] that they need more information about the adderall script. I don;t know if they need a PA or if it id too early to fill. She has a week left .

## 2024-03-23 DIAGNOSIS — M5441 Lumbago with sciatica, right side: Secondary | ICD-10-CM | POA: Diagnosis not present

## 2024-03-25 ENCOUNTER — Telehealth: Payer: Self-pay

## 2024-03-25 DIAGNOSIS — F419 Anxiety disorder, unspecified: Secondary | ICD-10-CM

## 2024-04-06 DIAGNOSIS — F321 Major depressive disorder, single episode, moderate: Secondary | ICD-10-CM | POA: Diagnosis not present

## 2024-04-06 DIAGNOSIS — E785 Hyperlipidemia, unspecified: Secondary | ICD-10-CM | POA: Diagnosis not present

## 2024-04-09 ENCOUNTER — Ambulatory Visit: Admitting: Psychiatry

## 2024-04-09 DIAGNOSIS — F330 Major depressive disorder, recurrent, mild: Secondary | ICD-10-CM

## 2024-04-09 NOTE — Progress Notes (Signed)
 [facility] Counselor/Therapist Progress Note  Patient ID: Andrea Burke, MRN: [id number],    Date: 04/09/2024  Time Spent: 53 minutes   Treatment Type: Individual Therapy  Reported Symptoms: anxiety, depression, worrying about herself and her husband's health,meds are doing better than at last session    Mental Status Exam:  Appearance:   Casual and Neat     Behavior:  Appropriate, Sharing, and Motivated  Motor:  Some issues due to her Parkinson's  Speech/Language:   Clear and Coherent  Affect:  Depressed and anxiety  Mood:  anxious and depressed  Thought process:  goal directed  Thought content:    Rumination  Sensory/Perceptual disturbances:    WNL  Orientation:  oriented to person, place, situation, month of year, year, and stated date of April 09, 2024  Attention:  Fair  Concentration:  Fair  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   Fund of knowledge:   Fair  Insight:    Fair  Judgment:   Good and Fair  Impulse Control:  Good   Risk Assessment: Danger to Self:  No Self-injurious Behavior: No Danger to Others: No Duty to Warn:no Physical Aggression / Violence:No  Access to Firearms a concern: No  Gang Involvement:No   Subjective:   Patient motivated and actively participating in session today working further on her anxiety and depression especially related to her Parkinson's and the fears and concerns she is having and needed to share which she did and seemed to feel better. Venting her fears and anxieties, including some tearfulness about her health concerns for herself and her husband now and into the future.  Did talk about these in detail today which seemed to be helpful for patient as she is not very open with many people.  Encouraged to continue exercises she is using to help with her recommendation of her physician others in the office that are treating her Parkinson's.  Processed some of her fears about her husband and his physical health problems.  Concerns  that he is very quiet and does not discuss much of his physical health even with patient.  Working with patient some to help her interrupt fears and anxieties that tend to happen frequently.  Patient is also working not to assume the worst in her situation nor her husband's situation.  Encouraged her to be in touch with people in her life that she does trust as I know she is a quiet person and she limits her trust to a few people.  Reviewed with patient towards end of session re: following doctor recommended exercises and strategies for her Parkinson disease and remaining on her medication as prescribed.   Interventions: Cognitive Behavioral Therapy, Solution-Oriented/Positive Psychology, and Ego-Supportive Long-Term goal: (measurable)  Enhance the ability to handle effectively the full variety of life's anxieties. Patient will eventually rate herself as a 3 or under on a 1-10 scale for depression and for anxiety, for a period of at least 2 months.  Strategies:  Patient will work on re-framing thoughts that have been leading her to feel more anxious.  The re-framed thoughts will support more calmness and personal empowerment for patient.     Short Term goal:  Increase understanding of beliefs and messages that produce worry and anxiety. Strategies: Patient will work with therapist to gain more understanding of how anxiety and worry are supported by certain beliefs and messages.  She will be able to give at least 3 examples of such messages in an upcoming  session.    Diagnosis:   ICD-10-CM   1. Mild episode of recurrent major depressive disorder (HCC)  F33.0      Plan:  Patient today continuing her work on anxiety, depression, and some sadness regarding her Parkinson's disease.  She did well in talking today and always appreciates support although does not ask for it for many people in fact she tends to be much more reserved with her health information.  Good follow-through in session today and  in talking about strategies that she has learned through her doctor to better manage her Parkinson's.  Some tearfulness but she also seemed stronger by end of session, as it seemed her feelings of anxiety have built up and by being able to release some of her anxiety and tears patient was able to participate well during the session especially when we were talking about coping skills and sticking with recommendations made by her doctors.  Good work in session and showing some determination which is an advantage for her.  Reminded patient to be keeping contact with people who are supportive to her, stay on her prescribed medications, and recognize the strength she continues to show working with goal-directed behaviors to move in a direction that supports her improved overall emotional health, her outlook and hope for the future, and overall wellbeing.  Andrea Burke has made progress and needs to continue working with her goal-directed behaviors to move in a healthier and more hopeful direction as she continues efforts to remain motivated and working on her therapy goals now and going forward.  Goal review and progress/challenges noted with patient.  Next appointment within 4 weeks.   Barnie Bunde, LCSW

## 2024-04-20 ENCOUNTER — Encounter: Payer: Self-pay | Admitting: Physician Assistant

## 2024-04-20 ENCOUNTER — Ambulatory Visit (INDEPENDENT_AMBULATORY_CARE_PROVIDER_SITE_OTHER): Admitting: Physician Assistant

## 2024-04-20 DIAGNOSIS — F330 Major depressive disorder, recurrent, mild: Secondary | ICD-10-CM | POA: Diagnosis not present

## 2024-04-20 DIAGNOSIS — R5383 Other fatigue: Secondary | ICD-10-CM

## 2024-04-20 DIAGNOSIS — F419 Anxiety disorder, unspecified: Secondary | ICD-10-CM

## 2024-04-20 MED ORDER — AMPHETAMINE-DEXTROAMPHETAMINE 5 MG PO TABS: 2.5000 mg | ORAL_TABLET | Freq: Every morning | ORAL | 0 refills | Status: DC

## 2024-04-20 MED ORDER — DULOXETINE HCL 30 MG PO CPEP: 30.0000 mg | ORAL_CAPSULE | Freq: Every day | ORAL | 1 refills | Status: DC

## 2024-04-20 NOTE — Progress Notes (Unsigned)
 [facility] Med Check  Patient ID: Andrea Burke,  MRN: [id number]  PCP: Allen Lauraine CROME, PA-C  Date of Evaluation: 04/20/2024 Time spent:30 minutes  Chief Complaint:  Chief Complaint   Anxiety; Depression; Follow-up    HISTORY/CURRENT STATUS: HPI for routine follow-up.     Individual Medical History/ Review of Systems: Changes? :Yes  fell trying to keep her husband from falling  Past medication trials: Remeron- vivid dreams, increased appetite Lexapro-nightmares Sertraline-agitation, insomnia, impaired concentration Paxil-intolerable side effects Fluoxetine-intolerable side effects Viibryd - Partial improvement on 20 mg. Does not want to take doses above 20 mg. Cymbalta   Wellbutrin- Severe tremors Lithium  Hydroxyzine- has taken for itching.  Xanax Deplin Gabapentin- prescribed for pain  Allergies: Doxycycline calcium and Penicillins  Current Medications:  Current Outpatient Medications:    carbidopa -levodopa  (SINEMET  IR) 25-100 MG tablet, Take 1 tablet by mouth 3 (three) times daily., Disp: 270 tablet, Rfl: 1   clobetasol cream (TEMOVATE) 0.05 %, Apply topically., Disp: , Rfl:    Clobetasol Propionate (TEMOVATE) 0.05 % external spray, Apply to scalp twice daily x 2 wk, then daily x 2 wk, then every other day x 2 wk. Not to face., Disp: , Rfl:    finasteride (PROSCAR) 5 MG tablet, Take 5 mg by mouth daily., Disp: , Rfl:    amphetamine -dextroamphetamine  (ADDERALL) 5 MG tablet, Take 0.5-1 tablets (2.5-5 mg total) by mouth every morning., Disp: 90 tablet, Rfl: 0   DULoxetine  (CYMBALTA ) 30 MG capsule, Take 1 capsule (30 mg total) by mouth daily., Disp: 90 capsule, Rfl: 1   hydroxychloroquine (PLAQUENIL) 200 MG tablet, Take by mouth daily. (Patient not taking: Reported on 03/12/2024), Disp: , Rfl:    lidocaine  (LIDODERM ) 5 %, Place 1 patch onto the skin daily. Remove & Discard patch within 12 hours or as directed by MD (Patient not taking: Reported on 04/20/2024), Disp:  30 patch, Rfl: 0 Medication Side Effects: none  Family Medical/ Social History: Changes? No  MENTAL HEALTH EXAM:  There were no vitals taken for this visit.There is no height or weight on file to calculate BMI.  General Appearance: Casual and Well Groomed  Eye Contact:  Good  Speech:  Clear and Coherent and Normal Rate  Volume:  Normal  Mood:  Sad but brightens easily  Affect:  Congruent  Thought Process:  Goal Directed and Descriptions of Associations: Circumstantial  Orientation:  Full (Time, Place, and Person)  Thought Content: Logical   Suicidal Thoughts:  No  Homicidal Thoughts:  No  Memory:  WNL  Judgement:  Good  Insight:  Good  Psychomotor Activity:  Normal  Concentration:  Concentration: Fair and Attention Span: Good  Recall:  Good  Fund of Knowledge: Good  Language: Good  Assets:  Desire for Improvement Financial Resources/Insurance Housing Transportation Vocational/Educational  ADL's:  Intact  Cognition: WNL  Prognosis:  Good   DIAGNOSES:    ICD-10-CM   1. Mild episode of recurrent major depressive disorder (HCC)  F33.0 DULoxetine  (CYMBALTA ) 30 MG capsule    2. Anxiety disorder, unspecified type  F41.9 DULoxetine  (CYMBALTA ) 30 MG capsule      Receiving Psychotherapy: Yes with Marval Bunde, LCSW.  RECOMMENDATIONS:   PDMP Reviewed.  Adderall filled 03/24/2024.    Lamictal in 2 weeks, call. Either wean off cymbalta  / start lamictal or do both at same time.   Continue Adderall 5 mg q am.  Continue Cymbalta  30 mg, 1 p.o. q evening.   Continue therapy with Marval Bunde, LCSW. Return in 6-8 weeks.  Verneita Cooks, PA-C

## 2024-04-21 ENCOUNTER — Ambulatory Visit: Payer: Medicare Other | Admitting: Occupational Therapy

## 2024-04-21 ENCOUNTER — Ambulatory Visit: Payer: Medicare Other | Admitting: Physical Therapy

## 2024-04-21 ENCOUNTER — Other Ambulatory Visit: Payer: Self-pay

## 2024-04-21 ENCOUNTER — Ambulatory Visit: Payer: Medicare Other | Attending: Neurology

## 2024-04-21 DIAGNOSIS — R29818 Other symptoms and signs involving the nervous system: Secondary | ICD-10-CM

## 2024-04-21 DIAGNOSIS — R2689 Other abnormalities of gait and mobility: Secondary | ICD-10-CM

## 2024-04-21 DIAGNOSIS — R4701 Aphasia: Secondary | ICD-10-CM | POA: Insufficient documentation

## 2024-04-21 DIAGNOSIS — R471 Dysarthria and anarthria: Secondary | ICD-10-CM

## 2024-04-21 NOTE — Therapy (Addendum)
 [place] [facility] [address] [place], [place], [address] Phone: [phone]   Fax:  [phone]  Patient Details  Name: Andrea Burke MRN: [id number] Date of Birth: 08-01-1946 Referring Provider:  Evonnie Stabs, DO  Encounter Date: 04/21/2024  Speech Therapy Parkinson's Disease Screen   Decibel Level today: 70dB  (WNL=70-72 dB) with sound level meter 30cm away from pt's mouth. Pt's conversational volume is low-WNL. However Andrea Burke stated that it seems like my tongue gets in the way when I'm talking sometimes.   Pt does not report difficulty with swallowing, which does not warrant further evaluation  Pt does not endorse changes in cognition.   Pt would benefit from dysarthria evaluation - please order via EPIC    Quinita Kostelecky, CCC-SLP 04/21/2024, 12:46 PM  [place] [facility] [address] [place], [place], [address] Phone: [phone]   Fax:  [phone]

## 2024-04-21 NOTE — Therapy (Signed)
 [place] [facility] [address] [place], [place], [address] Phone: [phone]   Fax:  [phone]  Patient Details  Name: Andrea Burke MRN: [id number] Date of Birth: 1945-11-18 Referring Provider:  Morgan Drivers, MD  Encounter Date: 04/21/2024  Physical Therapy Parkinson's Disease Screen   Timed Up and Go test: 12.90 sec (compared to 10.14 sec)  10 meter walk test: 10.31 sec = 3.19 ft/sec (compared to 3.77 ft/sec)  5 time sit to stand test:17.09 sec (compared to 11.6 sec)  Patient would benefit from Physical Therapy evaluation due to slowed mobility measures.  She feels strongly that her back is contributing and may want to see how her back feels after several week.  Will request evaluation to help her offset continued slowing of mobility.  Pt has had some increased pain in low back and hip and is slowing down slightly to allow this to heal, per her report.  She reports some catching with her foot, but no falls.  Jowan Skillin W., PT 04/21/2024, 8:01 AM  [place] [facility] [address] [place], [place], [address] Phone: [phone]   Fax:  [phone]

## 2024-04-21 NOTE — Therapy (Signed)
 [place] [facility] [address] [place], [place], [address] Phone: [phone]   Fax:  [phone]  Patient Details  Name: Andrea Burke MRN: [id number] Date of Birth: 1946-05-04 Referring Provider:  Morgan Drivers, MD  Encounter Date: 04/21/2024  Occupational Therapy Parkinson's Disease Screen  Hand dominance:  right   Physical Performance Test item #1 (handwriting):  10.50 sec.  Pt reports feeling like she was having some jerky movements when writing, but handwriting still 90% legible with good sizing throughout  Physical Performance Test item #2 (simulated eating):  11.37 sec  Fastening/unfastening 3 buttons in:  25.09 sec  9-hole peg test:    RUE  24.69 sec        LUE  27.91 sec  Other Comments:  Pt reports fine motor tasks (brushing teeth, using computer mouse, putting on eye makeup) are a tad down.  Pt reports that she finds that she steadies her R arm and frequently uses L.  Pt does not require occupational therapy services at this time.  Recommended occupational therapy screen in  6-8 months.  Educated on recommendation to ask for referral prior to that time if she feels skills are waning.     Andrea Burke, OT 04/21/2024, 10:56 AM  [place] [facility] [address] [place], [place], [address] Phone: [phone]   Fax:  [phone]

## 2024-05-05 DIAGNOSIS — Z1211 Encounter for screening for malignant neoplasm of colon: Secondary | ICD-10-CM | POA: Diagnosis not present

## 2024-05-05 DIAGNOSIS — Z01419 Encounter for gynecological examination (general) (routine) without abnormal findings: Secondary | ICD-10-CM | POA: Diagnosis not present

## 2024-05-05 DIAGNOSIS — M85852 Other specified disorders of bone density and structure, left thigh: Secondary | ICD-10-CM | POA: Diagnosis not present

## 2024-05-07 ENCOUNTER — Ambulatory Visit (INDEPENDENT_AMBULATORY_CARE_PROVIDER_SITE_OTHER): Admitting: Psychiatry

## 2024-05-07 DIAGNOSIS — E785 Hyperlipidemia, unspecified: Secondary | ICD-10-CM | POA: Diagnosis not present

## 2024-05-07 DIAGNOSIS — F321 Major depressive disorder, single episode, moderate: Secondary | ICD-10-CM | POA: Diagnosis not present

## 2024-05-07 DIAGNOSIS — F419 Anxiety disorder, unspecified: Secondary | ICD-10-CM | POA: Diagnosis not present

## 2024-05-07 NOTE — Progress Notes (Signed)
 [facility] Counselor/Therapist Progress Note  Patient ID: Andrea Burke, MRN: [id number],    Date: 05/07/2024  Time Spent: 53 minutes   Treatment Type: Individual Therapy  Reported Symptoms: anxiety, depression, still worries about self and her husband's health    Mental Status Exam:  Appearance:   Casual and Neat     Behavior:  Appropriate, Sharing, and Motivated  Motor:  A little bit of stumbling but not badand plans to mention it to her Dr at next appt  Speech/Language:   Clear and Coherent  Affect:  anxious  Mood:  anxious and some depression  Thought process:  goal directed  Thought content:    Rumination  Sensory/Perceptual disturbances:    WNL  Orientation:  oriented to person, place, time/date, situation, day of week, month of year, year, and stated date of May 07, 2024  Attention:  Fair  Concentration:  Good  Memory:  WNL  Fund of knowledge:   Good and Fair  Insight:    Fair  Judgment:   Good and Fair  Impulse Control:  Good   Risk Assessment: Danger to Self:  No Self-injurious Behavior: No Danger to Others: No Duty to Warn:no Physical Aggression / Violence:No  Access to Firearms a concern: No  Gang Involvement:No   Subjective: Patient today motivated and talking further in session and became quite tearful. Husband had cancer and surgery, now is trying to recover but things are moving slow. Patient tearfully talking through her concerns re: husband's cancer and overall health, and her physical and emotional struggles. Fears about her husband's health have increased, and fears about a decline in her own health. Very tearful in session today and took the time with patient to encourage her open expression of her fears and anxieties, which she was able to talk through at more length today.  Denies any thoughts of self-harm and gives no indication of it either.  He is rescheduled again within approximately 2 to 3 weeks and she knows she can call if needed  before that time.  Tearfulness had ended by the time she left appointment today and seemed to be a little more upbeat and talking further about what she is doing to try to help her Parkinson's particularly in regards to some exercise that has been recommended.  Also considering checking out the exercise program recommended for Parkinson's patients rock steady boxing which a lot of patients enjoy and also benefit from so I encouraged patient and following up on this.  Her physician's office has also encouraged it.  Worked with patient some on managing her fears and anxieties particularly when she is alone.  Encouraged her to be more frequently in touch with extended family and other people in her life that she trusts as she tends to be a quiet and reserved person and does not reach out very much.  Does seem to be following doctor recommended exercises and strategies for Parkinson's disease and reports that she is remaining on her medication as prescribed.  Mood was definitely more stable at session and.   Interventions: Cognitive Behavioral Therapy, Solution-Oriented/Positive Psychology, and Ego-Supportive Long-Term goal: (measurable)  Enhance the ability to handle effectively the full variety of life's anxieties. Patient will eventually rate herself as a 3 or under on a 1-10 scale for depression and for anxiety, for a period of at least 2 months.  Strategies:  Patient will work on re-framing thoughts that have been leading her to feel more anxious.  The  re-framed thoughts will support more calmness and personal empowerment for patient.     Short Term goal:  Increase understanding of beliefs and messages that produce worry and anxiety. Strategies: Patient will work with therapist to gain more understanding of how anxiety and worry are supported by certain beliefs and messages.  She will be able to give at least 3 examples of such messages in an upcoming session.   Diagnosis:   ICD-10-CM   1.  Anxiety disorder, unspecified type  F41.9      Plan: Patient today in session working further on her anxiety, depression, fears, and sadness, related to her Parkinson's disease and also to her husband's serious physical problems.  Did very well and opening up more than usual and also relating out some 2 years which seem to be very helpful to her in the long run and by the end of session.  At end of session she was tired but also felt supported and did not feel as along with the issues that she struggles with and create anxiety for her.  Also worked in reminding patient about different strategies for helping with her anxiety.  Plans to be in touch with extended family that live out of town by phone as that tends also help her feel supported.  Really good work, and difficult work, in session today.  Encouraged patient to be maintaining contact with people who are supportive of her, remain on her prescribed medications, and recognize the strengths that she continues to show with goal-directed behaviors helping her move in a direction that supports her improved overall emotional health, her outlook into the future, and her overall wellbeing.  Lanice Folden continues to make progress and needs to keep working with her goal-directed behaviors to move in a healthier and more hopeful direction as she puts forth effort to remain motivated and working on her therapy goals now and into the future.  Goal review and progress/challenges noted with patient.  Next appointment within 4 weeks.   Barnie Bunde, LCSW

## 2024-05-12 ENCOUNTER — Ambulatory Visit: Attending: Neurology

## 2024-05-12 ENCOUNTER — Other Ambulatory Visit: Payer: Self-pay

## 2024-05-12 DIAGNOSIS — R4701 Aphasia: Secondary | ICD-10-CM | POA: Diagnosis not present

## 2024-05-12 DIAGNOSIS — R471 Dysarthria and anarthria: Secondary | ICD-10-CM | POA: Insufficient documentation

## 2024-05-12 NOTE — Therapy (Addendum)
 OUTPATIENT SPEECH LANGUAGE PATHOLOGY [facility]   Patient Name: Andrea Burke MRN: [id number] DOB:1946-07-10, 78 y.o., female Today's Date: 05/13/2024  PCP: Allen Lauraine CROME, PA-C REFERRING PROVIDER: Evonnie Asberry RAMAN, DO  END OF SESSION:  End of Session - 05/13/24 0836     Visit Number 1    Number of Visits 17    Date for SLP Re-Evaluation 07/10/24    SLP Start Time 1406    SLP Stop Time  1449    SLP Time Calculation (min) 43 min    Activity Tolerance Patient tolerated treatment well          Past Medical History:  Diagnosis Date   Cancer (HCC)    KIDNEY   Discoid lupus    Endometriosis    Headache(784.0)    Insomnia    Kidney disease    Lupus    Osteopenia    Urinary incontinence    Past Surgical History:  Procedure Laterality Date   CATARACT EXTRACTION, BILATERAL     PELVIC LAPAROSCOPY     DIAG LASER LAP-ENDOMETRIOSIS   REMOVAL OF LEFT KIDNEY  10/08/2006   Patient Active Problem List   Diagnosis Date Noted   Major depressive disorder, single episode, moderate (HCC) 08/06/2018   Moderate major depression (HCC) 06/23/2018   Anxiety disorder 06/23/2018   Pruritus 04/01/2017   Onychomycosis 05/12/2012   Lentigines 05/12/2012   Discoid lupus    Cancer (HCC)    Headache    Insomnia    Endometriosis    Osteopenia     ONSET DATE: script 04/21/24, diagnosed fall 2024  REFERRING DIAG:  R47.1 (ICD-10-CM) - Dysarthria    THERAPY DIAG:  Dysarthria and anarthria  Rationale for Evaluation and Treatment: Rehabilitation  SUBJECTIVE:   SUBJECTIVE STATEMENT: It's like all of a sudden my tongue got bigger and it's hard to talk.  Pt accompanied by: self  PERTINENT HISTORY: See above   PAIN:  Are you having pain?  FALLS: Has patient fallen in last 6 months?  Yes, Number of falls: one  LIVING ENVIRONMENT: Lives with: lives with their spouse Lives in: House/apartment  PLOF:  Level of assistance: Independent with ADLs, Independent with  IADLs Employment: Retired  PATIENT GOALS: improve speech ability  OBJECTIVE:  Note: Objective measures were completed at Evaluation unless otherwise noted.  DIAGNOSTIC FINDINGS:  02/26/23 CLINICAL DATA:  Headache with increasing frequency or severity. EXAM: CT HEAD WITHOUT CONTRAST   TECHNIQUE: Contiguous axial images were obtained from the base of the skull through the vertex without intravenous contrast.   RADIATION DOSE REDUCTION: This exam was performed according to the departmental dose-optimization program which includes automated exposure control, adjustment of the mA and/or kV according to patient size and/or use of iterative reconstruction technique.   COMPARISON:  None Available.   FINDINGS: Brain: No evidence of acute infarction, hemorrhage, hydrocephalus, extra-axial collection or mass lesion/mass effect. Mild for age cerebral volume loss   Vascular: No hyperdense vessel or unexpected calcification.   Skull: Normal. Negative for fracture or focal lesion.   Sinuses/Orbits: No acute finding.   IMPRESSION: No acute or reversible finding.    COGNITION: Overall cognitive status: Within functional limits for tasks assessed   MOTOR SPEECH: Overall motor speech: impaired Level of impairment: Word, Phrase, Sentence, and Conversation Respiration: thoracic breathing Phonation: low vocal intensity Resonance: WFL Articulation: Appears intact Intelligibility: Intelligible Motor planning: Appears intact Effective technique: increased vocal intensity  ORAL MOTOR EXAMINATION: Overall status: Impaired: Labial: Bilateral (Coordination) Lingual: Right (Strength)  Bilateral (Coordination)  OBJECTIVE VOICE ASSESSMENT: Sustained ah maximum phonation time: 7.3 seconds Sustained ah loudness average: 64 dB Oral reading (passage) loudness average: 70 dB Oral reading loudness range: 56-79 dB Conversational loudness average: 61-72 dB Conversational loudness range:  66 dB Voice quality: low vocal intensity Stimulability trials: Given SLP modeling and occasional min cues, loudness average increased to 69dB (range of 60 to 72) at sentence response level.    Completed audio recording of patients baseline voice without cueing from SLP: No  Pt does not report difficulty with swallowing which does not warrant further evaluation.  PATIENT REPORTED OUTCOME MEASURES (PROM): Communication Effectiveness Survey: to be provided in first 1-2 sessions                                                                                                                            TREATMENT DATE:   05/12/24: SLP explained role in ST, need for pt to be more intentional and purposeful with speech, discussed pt's candidacy for skilled ST and based upon results today pt will be good candidate for improving speech loudness and clarity.  PATIENT EDUCATION: Education details: see treatment date this date, above Person educated: Patient Education method: Programmer, multimedia, Demonstration, and Verbal cues Education comprehension: verbalized understanding, returned demonstration, verbal cues required, and needs further education  HOME EXERCISE PROGRAM: Eventually intentional and deliberate speech   GOALS: Goals reviewed with patient? No  SHORT TERM GOALS: Target date: 06/12/24  At visit 9 pt will have improved PROM compared to initial measurement Baseline: Goal status: INITIAL  2.  Pt will improve loudness to average 70dB in simple conversation in 2 sessions Baseline:  Goal status: INITIAL  3.  Pt will demo AB in 5 minutes at rest, 85% success in 2 sessions Baseline:  Goal status: INITIAL  4.  Pt will demo AB in sentence responses 85% success in 2 sessions Baseline:  Goal status: INITIAL   LONG TERM GOALS: Target date: 07/10/24  Pt will improve PROM compared to STG measurement Baseline:  Goal status: INITIAL  2.  Pt will demo AB in 5 minutes simple to mod complex  conversation, 85% success, in 3 sessions Baseline:  Goal status: INITIAL   ASSESSMENT:  CLINICAL IMPRESSION: Patient is a 78 y.o. F who was seen today for assessment of speech in light of PD. Pt does not endorse s/sx dysphagia at this time. Based upon stimulability data taken today she would be a good candidate for skilled ST to improve her speech intelligibility.  OBJECTIVE IMPAIRMENTS: Objective impairments include dysarthria. These impairments are limiting patient from household responsibilities, ADLs/IADLs, and effectively communicating at home and in community.Factors affecting potential to achieve goals and functional outcome are none noted today.. Patient will benefit from skilled SLP services to address above impairments and improve overall function.  REHAB POTENTIAL: Good  PLAN:  SLP FREQUENCY: 2x/week  SLP DURATION: 8 weeks  PLANNED INTERVENTIONS: Environmental controls, Cueing hierachy, Internal/external aids, Oral motor exercises, Functional  tasks, SLP instruction and feedback, Compensatory strategies, Patient/family education, and [id number] Treatment of speech (30 or 45 min)     Lillianne Eick, CCC-SLP 05/13/2024, 8:37 AM

## 2024-05-19 ENCOUNTER — Ambulatory Visit

## 2024-05-19 DIAGNOSIS — R471 Dysarthria and anarthria: Secondary | ICD-10-CM | POA: Diagnosis not present

## 2024-05-19 DIAGNOSIS — R4701 Aphasia: Secondary | ICD-10-CM

## 2024-05-19 NOTE — Therapy (Signed)
 OUTPATIENT SPEECH LANGUAGE PATHOLOGY [facility]   Patient Name: Andrea Burke MRN: [id number] DOB:Dec 17, 1945, 78 y.o., female Today's Date: 05/19/2024  PCP: Allen Lauraine CROME, PA-C REFERRING PROVIDER: Evonnie Asberry RAMAN, DO  END OF SESSION:  End of Session - 05/19/24 1410     Visit Number 2    Number of Visits 17    Date for SLP Re-Evaluation 07/10/24    SLP Start Time 1404    SLP Stop Time  1445    SLP Time Calculation (min) 41 min    Activity Tolerance Patient tolerated treatment well           Past Medical History:  Diagnosis Date   Cancer (HCC)    KIDNEY   Discoid lupus    Endometriosis    Headache(784.0)    Insomnia    Kidney disease    Lupus    Osteopenia    Urinary incontinence    Past Surgical History:  Procedure Laterality Date   CATARACT EXTRACTION, BILATERAL     PELVIC LAPAROSCOPY     DIAG LASER LAP-ENDOMETRIOSIS   REMOVAL OF LEFT KIDNEY  10/08/2006   Patient Active Problem List   Diagnosis Date Noted   Major depressive disorder, single episode, moderate (HCC) 08/06/2018   Moderate major depression (HCC) 06/23/2018   Anxiety disorder 06/23/2018   Pruritus 04/01/2017   Onychomycosis 05/12/2012   Lentigines 05/12/2012   Discoid lupus    Cancer (HCC)    Headache    Insomnia    Endometriosis    Osteopenia     ONSET DATE: script 04/21/24, diagnosed fall 2024  REFERRING DIAG:  R47.1 (ICD-10-CM) - Dysarthria    THERAPY DIAG:  Dysarthria and anarthria  Rationale for Evaluation and Treatment: Rehabilitation  SUBJECTIVE:   SUBJECTIVE STATEMENT: It's like all of a sudden my tongue got bigger and it's hard to talk.  Pt accompanied by: self  PERTINENT HISTORY: See above   PAIN:  Are you having pain?  FALLS: Has patient fallen in last 6 months?  Yes, Number of falls: one  LIVING ENVIRONMENT: Lives with: lives with their spouse Lives in: House/apartment  PLOF:  Level of assistance: Independent with ADLs, Independent  with IADLs Employment: Retired  PATIENT GOALS: improve speech ability  OBJECTIVE:  Note: Objective measures were completed at Evaluation unless otherwise noted.  DIAGNOSTIC FINDINGS:  02/26/23 CLINICAL DATA:  Headache with increasing frequency or severity. EXAM: CT HEAD WITHOUT CONTRAST   TECHNIQUE: Contiguous axial images were obtained from the base of the skull through the vertex without intravenous contrast.   RADIATION DOSE REDUCTION: This exam was performed according to the departmental dose-optimization program which includes automated exposure control, adjustment of the mA and/or kV according to patient size and/or use of iterative reconstruction technique.   COMPARISON:  None Available.   FINDINGS: Brain: No evidence of acute infarction, hemorrhage, hydrocephalus, extra-axial collection or mass lesion/mass effect. Mild for age cerebral volume loss   Vascular: No hyperdense vessel or unexpected calcification.   Skull: Normal. Negative for fracture or focal lesion.   Sinuses/Orbits: No acute finding.   IMPRESSION: No acute or reversible finding.  OBJECTIVE VOICE ASSESSMENT: Sustained ah maximum phonation time: 7.3 seconds Sustained ah loudness average: 64 dB Oral reading (passage) loudness average: 70 dB Oral reading loudness range: 56-79 dB Conversational loudness average: 61-72 dB Conversational loudness range: 66 dB Voice quality: low vocal intensity Stimulability trials: Given SLP modeling and occasional min cues, loudness average increased to 69dB (range of 60 to 72)  at sentence response level.   PATIENT REPORTED OUTCOME MEASURES (PROM): Communication Effectiveness Survey: (CES) provided 05/19/24 and pt scored herself 16/32 with higher scores indicating more effective communication across situations. Pt scored herself 1 (not at all effective) with taking part in a conversation in a noisy environment.                                                                                                                               TREATMENT DATE:   05/19/24: SLP began session with providing pt with CES. SLP began today with pt reading with more intent, purpose, and more deliberate. Average loudness was 69dB with SLP providing consistent mod-max A faded to occasional mod A for effort/intent. Specific cues not to be timid about speaking louder. SLP also used the phrase speak out. Pt to practice reading words with intent x20, BID.   05/12/24: SLP explained role in ST, need for pt to be more intentional and purposeful with speech, discussed pt's candidacy for skilled ST and based upon results today pt will be good candidate for improving speech loudness and clarity.  PATIENT EDUCATION: Education details: see treatment date this date, above Person educated: Patient Education method: Programmer, multimedia, Demonstration, and Verbal cues Education comprehension: verbalized understanding, returned demonstration, verbal cues required, and needs further education  HOME EXERCISE PROGRAM: Eventually intentional and deliberate speech   GOALS: Goals reviewed with patient? No  SHORT TERM GOALS: Target date: 06/12/24  At visit 9 pt will have improved PROM compared to initial measurement Baseline: Goal status: INITIAL  2.  Pt will improve loudness to average 70dB in simple conversation in 2 sessions Baseline:  Goal status: INITIAL  3.  Pt will demo AB in 5 minutes at rest, 85% success in 2 sessions Baseline:  Goal status: INITIAL  4.  Pt will demo AB in sentence responses 85% success in 2 sessions Baseline:  Goal status: INITIAL   LONG TERM GOALS: Target date: 07/10/24  Pt will improve PROM compared to STG measurement Baseline:  Goal status: INITIAL  2.  Pt will demo AB in 5 minutes simple to mod complex conversation, 85% success, in 3 sessions Baseline:  Goal status: INITIAL   ASSESSMENT:  CLINICAL IMPRESSION: Patient is a 78 y.o. F who was seen  today for treatment of speech in light of PD. Pt does not endorse s/sx dysphagia at this time. See treatment date above for today's date for further details on today's session.   OBJECTIVE IMPAIRMENTS: Objective impairments include dysarthria. These impairments are limiting patient from household responsibilities, ADLs/IADLs, and effectively communicating at home and in community.Factors affecting potential to achieve goals and functional outcome are none noted today.. Patient will benefit from skilled SLP services to address above impairments and improve overall function.  REHAB POTENTIAL: Good  PLAN:  SLP FREQUENCY: 2x/week  SLP DURATION: 8 weeks  PLANNED INTERVENTIONS: Environmental controls, Cueing hierachy, Internal/external aids, Oral motor exercises, Functional tasks, SLP instruction and  feedback, Compensatory strategies, Patient/family education, and 07492 Treatment of speech (30 or 45 min)     Labella Zahradnik, CCC-SLP 05/19/2024, 3:26 PM

## 2024-06-02 ENCOUNTER — Encounter: Payer: Self-pay | Admitting: Physician Assistant

## 2024-06-02 ENCOUNTER — Ambulatory Visit (INDEPENDENT_AMBULATORY_CARE_PROVIDER_SITE_OTHER): Admitting: Physician Assistant

## 2024-06-02 DIAGNOSIS — G20A1 Parkinson's disease without dyskinesia, without mention of fluctuations: Secondary | ICD-10-CM

## 2024-06-02 DIAGNOSIS — M546 Pain in thoracic spine: Secondary | ICD-10-CM

## 2024-06-02 DIAGNOSIS — F419 Anxiety disorder, unspecified: Secondary | ICD-10-CM | POA: Diagnosis not present

## 2024-06-02 DIAGNOSIS — C801 Malignant (primary) neoplasm, unspecified: Secondary | ICD-10-CM | POA: Diagnosis not present

## 2024-06-02 DIAGNOSIS — F331 Major depressive disorder, recurrent, moderate: Secondary | ICD-10-CM

## 2024-06-02 DIAGNOSIS — R5381 Other malaise: Secondary | ICD-10-CM

## 2024-06-02 NOTE — Progress Notes (Unsigned)
 [facility]  Patient ID: Andrea Burke,  MRN: [id number]  PCP: Allen Lauraine CROME, PA-C  Date of Evaluation: 06/02/2024 Time spent:25 minutes  Chief Complaint:  Chief Complaint   Follow-up    HISTORY/CURRENT STATUS: HPI for routine follow-up.  Has seen Speech therapy for a baseline.  Nothing   Holding pattern  Cymbalta  dose not increased b/c poss nightmares.   Individual Medical History/ Review of Systems: Changes? :Yes  see HPI and on chart  Past medication trials: Remeron- vivid dreams, increased appetite Lexapro-nightmares Sertraline-agitation, insomnia, impaired concentration Paxil-intolerable side effects Fluoxetine-intolerable side effects Viibryd - Partial improvement on 20 mg. Does not want to take doses above 20 mg. Cymbalta   Wellbutrin- Severe tremors Lithium  Hydroxyzine- has taken for itching.  Xanax Deplin Gabapentin- prescribed for pain  Allergies: Doxycycline calcium and Penicillins  Current Medications:  Current Outpatient Medications:    amphetamine -dextroamphetamine  (ADDERALL) 5 MG tablet, Take 0.5-1 tablets (2.5-5 mg total) by mouth every morning., Disp: 90 tablet, Rfl: 0   carbidopa -levodopa  (SINEMET  IR) 25-100 MG tablet, Take 1 tablet by mouth 3 (three) times daily., Disp: 270 tablet, Rfl: 1   clobetasol cream (TEMOVATE) 0.05 %, Apply topically., Disp: , Rfl:    DULoxetine  (CYMBALTA ) 30 MG capsule, Take 1 capsule (30 mg total) by mouth daily., Disp: 90 capsule, Rfl: 1   finasteride (PROSCAR) 5 MG tablet, Take 5 mg by mouth daily., Disp: , Rfl:    Clobetasol Propionate (TEMOVATE) 0.05 % external spray, Apply to scalp twice daily x 2 wk, then daily x 2 wk, then every other day x 2 wk. Not to face., Disp: , Rfl:    hydroxychloroquine (PLAQUENIL) 200 MG tablet, Take by mouth daily. (Patient not taking: Reported on 05/12/2024), Disp: , Rfl:    lidocaine  (LIDODERM ) 5 %, Place 1 patch onto the skin daily. Remove & Discard patch within 12 hours  or as directed by MD (Patient taking differently: Place 1 patch onto the skin as needed. Remove & Discard patch within 12 hours or as directed by MD), Disp: 30 patch, Rfl: 0 Medication Side Effects: none  Family Medical/ Social History: Changes? No  MENTAL HEALTH EXAM:  There were no vitals taken for this visit.There is no height or weight on file to calculate BMI.  General Appearance: Casual and Well Groomed  Eye Contact:  Good  Speech:  Clear and Coherent and Normal Rate  Volume:  Normal  Mood:  Euthymic  Affect:  Congruent  Thought Process:  Goal Directed and Descriptions of Associations: Circumstantial  Orientation:  Full (Time, Place, and Person)  Thought Content: Logical   Suicidal Thoughts:  No  Homicidal Thoughts:  No  Memory:  WNL  Judgement:  Good  Insight:  Good  Psychomotor Activity:  Normal  Concentration:  Concentration: Fair and Attention Span: Good  Recall:  Good  Fund of Knowledge: Good  Language: Good  Assets:  Desire for Improvement Financial Resources/Insurance Housing Transportation Vocational/Educational  ADL's:  Intact  Cognition: WNL  Prognosis:  Good   DIAGNOSES:  No diagnosis found.  Receiving Psychotherapy: Yes with Marval Bunde, LCSW.  RECOMMENDATIONS:   PDMP Reviewed.  Adderall filled 03/24/2024.   Lamictal discussed again, I'd want to discuss w/ Dr. Evonnie  Continue Adderall 5 mg q am.  Continue Cymbalta  30 mg, 1 p.o. q evening.   Continue therapy with Marval Bunde, LCSW. Return in 6 weeks.  Verneita Cooks, PA-C

## 2024-06-03 ENCOUNTER — Ambulatory Visit

## 2024-06-03 ENCOUNTER — Encounter: Payer: Self-pay | Admitting: Physician Assistant

## 2024-06-03 DIAGNOSIS — R471 Dysarthria and anarthria: Secondary | ICD-10-CM

## 2024-06-03 DIAGNOSIS — R4701 Aphasia: Secondary | ICD-10-CM

## 2024-06-03 NOTE — Therapy (Signed)
 OUTPATIENT SPEECH LANGUAGE PATHOLOGY [facility]   Patient Name: Andrea Burke MRN: [id number] DOB:[birth year], 78 y.o., female Today's Date: 06/03/2024  PCP: Allen Lauraine CROME, PA-C REFERRING PROVIDER: Evonnie Asberry RAMAN, DO  END OF SESSION:  End of Session - 06/03/24 1726     Visit Number 3    Number of Visits 17    Date for SLP Re-Evaluation 07/10/24    SLP Start Time 1621    SLP Stop Time  1703    SLP Time Calculation (min) 42 min    Activity Tolerance Patient tolerated treatment well            Past Medical History:  Diagnosis Date   Cancer (HCC)    KIDNEY   Discoid lupus    Endometriosis    Headache(784.0)    Insomnia    Kidney disease    Lupus    Osteopenia    Urinary incontinence    Past Surgical History:  Procedure Laterality Date   CATARACT EXTRACTION, BILATERAL     PELVIC LAPAROSCOPY     DIAG LASER LAP-ENDOMETRIOSIS   REMOVAL OF LEFT KIDNEY  10/08/2006   Patient Active Problem List   Diagnosis Date Noted   Major depressive disorder, single episode, moderate (HCC) 08/06/2018   Moderate major depression (HCC) 06/23/2018   Anxiety disorder 06/23/2018   Pruritus 04/01/2017   Onychomycosis 05/12/2012   Lentigines 05/12/2012   Discoid lupus    Cancer (HCC)    Headache    Insomnia    Endometriosis    Osteopenia     ONSET DATE: script 04/21/24, diagnosed fall 2024  REFERRING DIAG:  R47.1 (ICD-10-CM) - Dysarthria    THERAPY DIAG:  Dysarthria and anarthria  Rationale for Evaluation and Treatment: Rehabilitation  SUBJECTIVE:   SUBJECTIVE STATEMENT: Pt practiced word level at home between last session and today.  Pt accompanied by: self  PERTINENT HISTORY: See above   PAIN:  Are you having pain? Yes 5/10, mid back, aggravated by bending and UE movement, alleviated by heat, resting  FALLS: Has patient fallen in last 6 months?  Yes, Number of falls: one   PATIENT GOALS: improve speech ability  OBJECTIVE:  Note: Objective  measures were completed at Evaluation unless otherwise noted.  DIAGNOSTIC FINDINGS:  02/26/23 CLINICAL DATA:  Headache with increasing frequency or severity. EXAM: CT HEAD WITHOUT CONTRAST   TECHNIQUE: Contiguous axial images were obtained from the base of the skull through the vertex without intravenous contrast.   RADIATION DOSE REDUCTION: This exam was performed according to the departmental dose-optimization program which includes automated exposure control, adjustment of the mA and/or kV according to patient size and/or use of iterative reconstruction technique.   COMPARISON:  None Available.   FINDINGS: Brain: No evidence of acute infarction, hemorrhage, hydrocephalus, extra-axial collection or mass lesion/mass effect. Mild for age cerebral volume loss   Vascular: No hyperdense vessel or unexpected calcification.   Skull: Normal. Negative for fracture or focal lesion.   Sinuses/Orbits: No acute finding.   IMPRESSION: No acute or reversible finding.   PATIENT REPORTED OUTCOME MEASURES (PROM): Communication Effectiveness Survey: (CES) provided 05/19/24 and pt scored herself 16/32 with higher scores indicating more effective communication across situations. Pt scored herself 1 (not at all effective) with taking part in a conversation in a noisy environment.  TREATMENT DATE:   06/03/24: SLP targeted word level intent/purposeful speech with near 100% success. Pt  notably had difficulty with [employer]. Seemed concerned about this. SLP told pt to write down words she feels she gets tied up on and practice those  words if they are common words; SLP provided examples. SLP moved to 3-4 word phrases/sentences and pt success with WNL volume and clarity 90%. Pt to practice phrase level responses until next session. Pt cancelled some appointments after last  session to bring some weeks to x1/week, and pt asked SLP if she could get them back. SLP asked pt to check with support rep.   05/19/24: SLP began session with providing pt with CES. SLP began today with pt reading with more intent, purpose, and more deliberate. Average loudness was 69dB with SLP providing consistent mod-max A faded to occasional mod A for effort/intent. Specific cues not to be timid about speaking louder. SLP also used the phrase speak out. Pt to practice reading words with intent x20, BID.   05/12/24: SLP explained role in ST, need for pt to be more intentional and purposeful with speech, discussed pt's candidacy for skilled ST and based upon results today pt will be good candidate for improving speech loudness and clarity.  PATIENT EDUCATION: Education details: see treatment date this date, above Person educated: Patient Education method: Programmer, multimedia, Demonstration, and Verbal cues Education comprehension: verbalized understanding, returned demonstration, verbal cues required, and needs further education  HOME EXERCISE PROGRAM: Eventually intentional and deliberate speech   GOALS: Goals reviewed with patient? No  SHORT TERM GOALS: Target date: 06/12/24  At visit 9 pt will have improved PROM compared to initial measurement Baseline: Goal status: INITIAL  2.  Pt will improve loudness to average 70dB in simple conversation in 2 sessions Baseline:  Goal status: INITIAL  3.  Pt will demo AB in 5 minutes at rest, 85% success in 2 sessions Baseline:  Goal status: INITIAL  4.  Pt will demo AB in sentence responses 85% success in 2 sessions Baseline:  Goal status: INITIAL   LONG TERM GOALS: Target date: 07/10/24  Pt will improve PROM compared to STG measurement Baseline:  Goal status: INITIAL  2.  Pt will demo AB in 5 minutes simple to mod complex conversation, 85% success, in 3 sessions Baseline:  Goal status: INITIAL   ASSESSMENT:  CLINICAL  IMPRESSION: Patient is a 77 y.o. F who was seen today for treatment of speech in light of PD. Pt does not endorse s/sx dysphagia at this time. See treatment date above for today's date for further details on today's session.   OBJECTIVE IMPAIRMENTS: Objective impairments include dysarthria. These impairments are limiting patient from household responsibilities, ADLs/IADLs, and effectively communicating at home and in community.Factors affecting potential to achieve goals and functional outcome are none noted today.. Patient will benefit from skilled SLP services to address above impairments and improve overall function.  REHAB POTENTIAL: Good  PLAN:  SLP FREQUENCY: 2x/week  SLP DURATION: 8 weeks  PLANNED INTERVENTIONS: Environmental controls, Cueing hierachy, Internal/external aids, Oral motor exercises, Functional tasks, SLP instruction and feedback, Compensatory strategies, Patient/family education, and [id number] Treatment of speech (30 or 45 min)     Jizel Cheeks, CCC-SLP 06/03/2024, 5:27 PM

## 2024-06-04 ENCOUNTER — Ambulatory Visit (INDEPENDENT_AMBULATORY_CARE_PROVIDER_SITE_OTHER): Admitting: Psychiatry

## 2024-06-04 DIAGNOSIS — F331 Major depressive disorder, recurrent, moderate: Secondary | ICD-10-CM

## 2024-06-04 NOTE — Progress Notes (Signed)
 [facility] Counselor/Therapist Progress Note  Patient ID: Andrea Burke, MRN: [id number],    Date: 06/04/2024  Time Spent: 53 minutes   Treatment Type: Individual Therapy  Reported Symptoms: anxiety, depression, worrying a lot about herself and her husband's health    Mental Status Exam:  Appearance:   Casual and Neat     Behavior:  Appropriate, Sharing, and Motivated (some days not quite as motivated)  Motor:  Some tremors if I don't tak  Speech/Language:   Normal Rate  Affect:  Depressed and anxious  Mood:  anxious and depressed  Thought process:  goal directed  Thought content:    Rumination  Sensory/Perceptual disturbances:    WNL  Orientation:  oriented to person, place, month of year, year, and does sometimes lose track of day of week and the time   Attention:  Fair  Concentration:  Fair  Memory:  Some memory issues noted  Fund of knowledge:   Fair  Insight:    Good and Fair  Judgment:   Good and Fair  Impulse Control:  Fair   Risk Assessment: Danger to Self:  No Self-injurious Behavior: No Danger to Others: No Duty to Warn:no Physical Aggression / Violence:No  Access to Firearms a concern: No  Gang Involvement:No   Subjective:   Patient today working further on her anxiety, depression, and particularly her and her fearfulness about aging especially as I watch my 76 yr old mother age more, and I know I need to be thinking about the future more as I and husband age.  More emotional today and concerned about aging for her and her husband and what would we do if something happen to either 1 of us .  Processed some of this with patient today in session.  Very tearful at times.  Asking some really good questions.  Seem to be very helpful for her to be able to be tearful and supported, and talk through some initial questions about options and where she can get more information about resources for aging adults, which was shared with her today in session.  Some  communication issues with husband as he tends to not be very involved in talking.  States that he is more avoidant of issues.  Husband continues to be seen for his cancer and overall health issues.  Patient has seemed a little more stable with her Parkinson's and does tend to follow through better on doctor recommendations.  Misses family that lives in [place] .  Denies any thoughts to harm herself or anyone else and gives no indication of it.  Is being scheduled to return within 3 to 4 weeks and can also call ahead of that if she needs to get in sooner.  Encouraged patient is much as possible for her to be around people more.  Does plan to go to [facility] for her mother's birthday party soon.  Mood much more stable by end of session.   Interventions: Cognitive Behavioral Therapy, Solution-Oriented/Positive Psychology, and Ego-Supportive Long-Term goal: (measurable)  Enhance the ability to handle effectively the full variety of life's anxieties. Patient will eventually rate herself as a 3 or under on a 1-10 scale for depression and for anxiety, for a period of at least 2 months.  Strategies:  Patient will work on re-framing thoughts that have been leading her to feel more anxious.  The re-framed thoughts will support more calmness and personal empowerment for patient.     Short Term goal:  Increase  understanding of beliefs and messages that produce worry and anxiety. Strategies: Patient will work with therapist to gain more understanding of how anxiety and worry are supported by certain beliefs and messages.  She will be able to give at least 3 examples of such messages in an upcoming session.   Diagnosis:   ICD-10-CM   1. Major depressive disorder, recurrent episode, moderate (HCC)  F33.1      Plan: Patient in session today working well on her anxiety, fears about the future, depression, sadness, and issues related to her Parkinson's disease and also her husband's physical issues  that continued, and husband has reportedly received care following his cancer.  Patient continues to be opening up more in sessions which is helpful for her.  Does note that she tires more easily which she relates to some aging and also physical issues with her Parkinson's.  Is planning a contact soon with her extended family that live out of town as this helps patient feel supported.  Reminded patient to be maintaining contact with people who are supportive of her, remain on her prescribed medications, and recognize the strength that she continues to show with goal-directed behaviors helping her to move in a forward direction that supports her improved overall emotional health, her outlook into the future, and her overall wellbeing.  Hagen Tidd does continue to make progress and needs to keep working with her goal-directed behaviors to move in a healthier and more hopeful direction as she puts forth effort to remain motivated and working on her therapy goals helping her move forward into the future.  Goal review and progress/challenges noted with patient.  Next appointment within 4 weeks.   Barnie Bunde, LCSW

## 2024-06-05 DIAGNOSIS — E785 Hyperlipidemia, unspecified: Secondary | ICD-10-CM | POA: Diagnosis not present

## 2024-06-05 DIAGNOSIS — F321 Major depressive disorder, single episode, moderate: Secondary | ICD-10-CM | POA: Diagnosis not present

## 2024-06-10 ENCOUNTER — Encounter

## 2024-06-10 DIAGNOSIS — M8589 Other specified disorders of bone density and structure, multiple sites: Secondary | ICD-10-CM | POA: Diagnosis not present

## 2024-06-12 ENCOUNTER — Ambulatory Visit: Attending: Neurology

## 2024-06-12 DIAGNOSIS — R471 Dysarthria and anarthria: Secondary | ICD-10-CM | POA: Insufficient documentation

## 2024-06-12 DIAGNOSIS — R4701 Aphasia: Secondary | ICD-10-CM | POA: Insufficient documentation

## 2024-06-12 NOTE — Therapy (Signed)
 OUTPATIENT SPEECH LANGUAGE PATHOLOGY [facility]   Patient Name: Andrea Burke MRN: [id number] DOB:[birth year], 78 y.o., female Today's Date: 06/12/2024  PCP: Allen Lauraine CROME, PA-C REFERRING PROVIDER: Evonnie Asberry RAMAN, DO  END OF SESSION:  End of Session - 06/12/24 1338     Visit Number 4    Number of Visits 17    Date for SLP Re-Evaluation 07/10/24    SLP Start Time 0935    SLP Stop Time  1015    SLP Time Calculation (min) 40 min    Activity Tolerance Patient tolerated treatment well             Past Medical History:  Diagnosis Date   Cancer (HCC)    KIDNEY   Discoid lupus    Endometriosis    Headache(784.0)    Insomnia    Kidney disease    Lupus    Osteopenia    Urinary incontinence    Past Surgical History:  Procedure Laterality Date   CATARACT EXTRACTION, BILATERAL     PELVIC LAPAROSCOPY     DIAG LASER LAP-ENDOMETRIOSIS   REMOVAL OF LEFT KIDNEY  10/08/2006   Patient Active Problem List   Diagnosis Date Noted   Major depressive disorder, single episode, moderate (HCC) 08/06/2018   Moderate major depression (HCC) 06/23/2018   Anxiety disorder 06/23/2018   Pruritus 04/01/2017   Onychomycosis 05/12/2012   Lentigines 05/12/2012   Discoid lupus    Cancer (HCC)    Headache    Insomnia    Endometriosis    Osteopenia     ONSET DATE: script 04/21/24, diagnosed fall 2024  REFERRING DIAG:  R47.1 (ICD-10-CM) - Dysarthria    THERAPY DIAG:  Dysarthria and anarthria  Rationale for Evaluation and Treatment: Rehabilitation  SUBJECTIVE:   SUBJECTIVE STATEMENT: Pt practiced word and phrase level production at home between last session and today.  Pt accompanied by: self  PERTINENT HISTORY: See above   PAIN:  Are you having pain? Yes 5/10, mid back, aggravated by bending and UE movement, alleviated by heat, resting  FALLS: Has patient fallen in last 6 months?  Yes, Number of falls: one   PATIENT GOALS: improve speech  ability  OBJECTIVE:  Note: Objective measures were completed at Evaluation unless otherwise noted.  DIAGNOSTIC FINDINGS:  02/26/23 CLINICAL DATA:  Headache with increasing frequency or severity. EXAM: CT HEAD WITHOUT CONTRAST   TECHNIQUE: Contiguous axial images were obtained from the base of the skull through the vertex without intravenous contrast.   RADIATION DOSE REDUCTION: This exam was performed according to the departmental dose-optimization program which includes automated exposure control, adjustment of the mA and/or kV according to patient size and/or use of iterative reconstruction technique.   COMPARISON:  None Available.   FINDINGS: Brain: No evidence of acute infarction, hemorrhage, hydrocephalus, extra-axial collection or mass lesion/mass effect. Mild for age cerebral volume loss   Vascular: No hyperdense vessel or unexpected calcification.   Skull: Normal. Negative for fracture or focal lesion.   Sinuses/Orbits: No acute finding.   IMPRESSION: No acute or reversible finding.   PATIENT REPORTED OUTCOME MEASURES (PROM): Communication Effectiveness Survey: (CES) provided 05/19/24 and pt scored herself 16/32 with higher scores indicating more effective communication across situations. Pt scored herself 1 (not at all effective) with taking part in a conversation in a noisy environment.  TREATMENT DATE:   06/12/24: To warm up SLP gave pt sentences to read with intent. Pt with 80% success. SLP targeted phrases and sentences with pt using intent and purpose in her speech. Pt demonstrated incr'd mindfulness with responses 75% of the time, improving articulation and making volume more consistently WNL range. SLP with explanation that pt's changing a habit to being more intentional and purpseful with her speech will benefit later as pt stated, If I  do this why does it help? It's no different for the person I am talking to.   06/03/24: SLP targeted word level intent/purposeful speech with near 100% success. Pt  notably had difficulty with [employer]. Seemed concerned about this. SLP told pt to write down words she feels she gets tied up on and practice those  words if they are common words; SLP provided examples. SLP moved to 3-4 word phrases/sentences and pt success with WNL volume and clarity 90%. Pt to practice phrase level responses until next session. Pt cancelled some appointments after last session to bring some weeks to x1/week, and pt asked SLP if she could get them back. SLP asked pt to check with support rep.   05/19/24: SLP began session with providing pt with CES. SLP began today with pt reading with more intent, purpose, and more deliberate. Average loudness was 69dB with SLP providing consistent mod-max A faded to occasional mod A for effort/intent. Specific cues not to be timid about speaking louder. SLP also used the phrase speak out. Pt to practice reading words with intent x20, BID.   05/12/24: SLP explained role in ST, need for pt to be more intentional and purposeful with speech, discussed pt's candidacy for skilled ST and based upon results today pt will be good candidate for improving speech loudness and clarity.  PATIENT EDUCATION: Education details: see treatment date this date, above Person educated: Patient Education method: Programmer, multimedia, Demonstration, and Verbal cues Education comprehension: verbalized understanding, returned demonstration, verbal cues required, and needs further education  HOME EXERCISE PROGRAM: Eventually intentional and deliberate speech   GOALS: Goals reviewed with patient? No  SHORT TERM GOALS: Target date: 06/12/24  At visit 9 pt will have improved PROM compared to initial measurement Baseline: Goal status: INITIAL  2.  Pt will improve loudness to average 70dB in simple conversation in  2 sessions Baseline:  Goal status: INITIAL  3.  Pt will demo AB in 5 minutes at rest, 85% success in 2 sessions Baseline:  Goal status: INITIAL  4.  Pt will demo AB in sentence responses 85% success in 2 sessions Baseline:  Goal status: INITIAL   LONG TERM GOALS: Target date: 07/10/24  Pt will improve PROM compared to STG measurement Baseline:  Goal status: INITIAL  2.  Pt will demo AB in 5 minutes simple to mod complex conversation, 85% success, in 3 sessions Baseline:  Goal status: INITIAL   ASSESSMENT:  CLINICAL IMPRESSION: Patient is a 78 y.o. F who was seen today for treatment of speech in light of PD. See treatment date above for today's date for further details on today's session. Pt does not endorse s/sx dysphagia at this time.   OBJECTIVE IMPAIRMENTS: Objective impairments include dysarthria. These impairments are limiting patient from household responsibilities, ADLs/IADLs, and effectively communicating at home and in community.Factors affecting potential to achieve goals and functional outcome are none noted today.. Patient will benefit from skilled SLP services to address above impairments and improve overall function.  REHAB POTENTIAL: Good  PLAN:  SLP  FREQUENCY: 2x/week  SLP DURATION: 8 weeks  PLANNED INTERVENTIONS: Environmental controls, Cueing hierachy, Internal/external aids, Oral motor exercises, Functional tasks, SLP instruction and feedback, Compensatory strategies, Patient/family education, and [id number] Treatment of speech (30 or 45 min)     Riyan Gavina, CCC-SLP 06/12/2024, 1:39 PM

## 2024-06-15 ENCOUNTER — Ambulatory Visit

## 2024-06-15 DIAGNOSIS — R4701 Aphasia: Secondary | ICD-10-CM

## 2024-06-15 DIAGNOSIS — R471 Dysarthria and anarthria: Secondary | ICD-10-CM

## 2024-06-15 NOTE — Therapy (Signed)
 OUTPATIENT SPEECH LANGUAGE PATHOLOGY [facility]   Patient Name: Andrea Burke MRN: [id number] DOB:24-Jul-1946, 78 y.o., female Today's Date: 06/15/2024  PCP: Allen Lauraine CROME, PA-C REFERRING PROVIDER: Evonnie Asberry RAMAN, DO  END OF SESSION:  End of Session - 06/15/24 1110     Visit Number 5    Number of Visits 17    Date for SLP Re-Evaluation 07/10/24    SLP Start Time 1106    SLP Stop Time  1145    SLP Time Calculation (min) 39 min    Activity Tolerance Patient tolerated treatment well             Past Medical History:  Diagnosis Date   Cancer (HCC)    KIDNEY   Discoid lupus    Endometriosis    Headache(784.0)    Insomnia    Kidney disease    Lupus    Osteopenia    Urinary incontinence    Past Surgical History:  Procedure Laterality Date   CATARACT EXTRACTION, BILATERAL     PELVIC LAPAROSCOPY     DIAG LASER LAP-ENDOMETRIOSIS   REMOVAL OF LEFT KIDNEY  10/08/2006   Patient Active Problem List   Diagnosis Date Noted   Major depressive disorder, single episode, moderate (HCC) 08/06/2018   Moderate major depression (HCC) 06/23/2018   Anxiety disorder 06/23/2018   Pruritus 04/01/2017   Onychomycosis 05/12/2012   Lentigines 05/12/2012   Discoid lupus    Cancer (HCC)    Headache    Insomnia    Endometriosis    Osteopenia     ONSET DATE: script 04/21/24, diagnosed fall 2024  REFERRING DIAG:  R47.1 (ICD-10-CM) - Dysarthria    THERAPY DIAG:  Dysarthria and anarthria  Rationale for Evaluation and Treatment: Rehabilitation  SUBJECTIVE:   SUBJECTIVE STATEMENT: Pt practiced phrase and sentence level production at home between last session and today.  Pt accompanied by: self  PERTINENT HISTORY: See above   PAIN:  Are you having pain? Yes 5/10, mid back, aggravated by bending and UE movement, alleviated by heat, resting  FALLS: Has patient fallen in last 6 months?  Yes, Number of falls: one   PATIENT GOALS: improve speech  ability  OBJECTIVE:  Note: Objective measures were completed at Evaluation unless otherwise noted.  DIAGNOSTIC FINDINGS:  02/26/23 CLINICAL DATA:  Headache with increasing frequency or severity. EXAM: CT HEAD WITHOUT CONTRAST   TECHNIQUE: Contiguous axial images were obtained from the base of the skull through the vertex without intravenous contrast.   RADIATION DOSE REDUCTION: This exam was performed according to the departmental dose-optimization program which includes automated exposure control, adjustment of the mA and/or kV according to patient size and/or use of iterative reconstruction technique.   COMPARISON:  None Available.   FINDINGS: Brain: No evidence of acute infarction, hemorrhage, hydrocephalus, extra-axial collection or mass lesion/mass effect. Mild for age cerebral volume loss   Vascular: No hyperdense vessel or unexpected calcification.   Skull: Normal. Negative for fracture or focal lesion.   Sinuses/Orbits: No acute finding.   IMPRESSION: No acute or reversible finding.   PATIENT REPORTED OUTCOME MEASURES (PROM): Communication Effectiveness Survey: (CES) provided 05/19/24 and pt scored herself 16/32 with higher scores indicating more effective communication across situations. Pt scored herself 1 (not at all effective) with taking part in a conversation in a noisy environment.  TREATMENT DATE:   06/15/24: No more falls since eval. Pt has been practicing at home. Word level responses were 95% with intent noted. Sentence level responses were noted with intent/purpose/mindfulness 88% of the time - loudness 71dB average. Pt self corrected once, and improved her production to 100% when asked by SLP to repeat due to not using purpose with her answer. Pt stated she felt like she was shouting at someone so she didn't want to use intent. SLP  used digital recorder x2 to use auditory feedback to demo to pt she was not shouting. She agreed with SLP that her speech was WNL volume. SLP showed her using diagram why she feels like she is shouting. Pt to cont to practice at sentence level.   06/12/24: To warm up SLP gave pt sentences to read with intent. Pt with 80% success. SLP targeted phrases and sentences with pt using intent and purpose in her speech. Pt demonstrated incr'd mindfulness with responses 75% of the time, improving articulation and making volume more consistently WNL range. SLP with explanation that pt's changing a habit to being more intentional and purpseful with her speech will benefit later as pt stated, If I do this why does it help? It's no different for the person I am talking to.   06/03/24: SLP targeted word level intent/purposeful speech with near 100% success. Pt  notably had difficulty with [employer]. Seemed concerned about this. SLP told pt to write down words she feels she gets tied up on and practice those  words if they are common words; SLP provided examples. SLP moved to 3-4 word phrases/sentences and pt success with WNL volume and clarity 90%. Pt to practice phrase level responses until next session. Pt cancelled some appointments after last session to bring some weeks to x1/week, and pt asked SLP if she could get them back. SLP asked pt to check with support rep.   05/19/24: SLP began session with providing pt with CES. SLP began today with pt reading with more intent, purpose, and more deliberate. Average loudness was 69dB with SLP providing consistent mod-max A faded to occasional mod A for effort/intent. Specific cues not to be timid about speaking louder. SLP also used the phrase speak out. Pt to practice reading words with intent x20, BID.   05/12/24: SLP explained role in ST, need for pt to be more intentional and purposeful with speech, discussed pt's candidacy for skilled ST and based upon results today pt  will be good candidate for improving speech loudness and clarity.  PATIENT EDUCATION: Education details: see treatment date this date, above Person educated: Patient Education method: Programmer, multimedia, Demonstration, and Verbal cues Education comprehension: verbalized understanding, returned demonstration, verbal cues required, and needs further education  HOME EXERCISE PROGRAM: Eventually intentional and deliberate speech   GOALS: Goals reviewed with patient? No  SHORT TERM GOALS: Target date:  07/07/24 due to visit number  At visit 9 pt will have improved PROM compared to initial measurement Baseline: Goal status: INITIAL  2.  Pt will improve loudness to average 70dB in simple conversation in 2 sessions Baseline:  Goal status: INITIAL  3.  Pt will demo AB in 5 minutes at rest, 85% success in 2 sessions Baseline:  Goal status: INITIAL  4.  Pt will demo AB in sentence responses 85% success in 2 sessions Baseline: 06/15/24 Goal status: INITIAL   LONG TERM GOALS: Target date: 07/10/24  Pt will improve PROM compared to STG measurement Baseline:  Goal status: INITIAL  2.  Pt will demo AB in 5 minutes simple to mod complex conversation, 85% success, in 3 sessions Baseline:  Goal status: INITIAL   ASSESSMENT:  CLINICAL IMPRESSION: Patient is a 78 y.o. F who was seen today for treatment of speech in light of PD. See treatment date above for today's date for further details on today's session. She is improving in ability to use more intent and in so doing, incr her speech clarity. Pt does not endorse s/sx dysphagia at this time.   OBJECTIVE IMPAIRMENTS: Objective impairments include dysarthria. These impairments are limiting patient from household responsibilities, ADLs/IADLs, and effectively communicating at home and in community.Factors affecting potential to achieve goals and functional outcome are none noted today.. Patient will benefit from skilled SLP services to address  above impairments and improve overall function.  REHAB POTENTIAL: Good  PLAN:  SLP FREQUENCY: 2x/week  SLP DURATION: 8 weeks  PLANNED INTERVENTIONS: Environmental controls, Cueing hierachy, Internal/external aids, Oral motor exercises, Functional tasks, SLP instruction and feedback, Compensatory strategies, Patient/family education, and 07492 Treatment of speech (30 or 45 min)     Deva Ron, CCC-SLP 06/15/2024, 11:11 AM

## 2024-06-17 ENCOUNTER — Encounter

## 2024-06-22 ENCOUNTER — Ambulatory Visit

## 2024-06-22 DIAGNOSIS — R4701 Aphasia: Secondary | ICD-10-CM

## 2024-06-22 DIAGNOSIS — R471 Dysarthria and anarthria: Secondary | ICD-10-CM | POA: Diagnosis not present

## 2024-06-22 NOTE — Therapy (Signed)
 OUTPATIENT SPEECH LANGUAGE PATHOLOGY [facility]   Patient Name: Andrea Burke MRN: [id number] DOB:[birth year], 78 y.o., female Today's Date: 06/22/2024  PCP: Andrea Burke CROME, PA-C REFERRING PROVIDER: Evonnie Asberry RAMAN, DO  END OF SESSION:  End of Session - 06/22/24 1231     Visit Number 6    Number of Visits 17    Date for SLP Re-Evaluation 07/10/24    SLP Start Time 1104    SLP Stop Time  1145    SLP Time Calculation (min) 41 min    Activity Tolerance Patient tolerated treatment well              Past Medical History:  Diagnosis Date   Cancer (HCC)    KIDNEY   Discoid lupus    Endometriosis    Headache(784.0)    Insomnia    Kidney disease    Lupus    Osteopenia    Urinary incontinence    Past Surgical History:  Procedure Laterality Date   CATARACT EXTRACTION, BILATERAL     PELVIC LAPAROSCOPY     DIAG LASER LAP-ENDOMETRIOSIS   REMOVAL OF LEFT KIDNEY  10/08/2006   Patient Active Problem List   Diagnosis Date Noted   Major depressive disorder, single episode, moderate (HCC) 08/06/2018   Moderate major depression (HCC) 06/23/2018   Anxiety disorder 06/23/2018   Pruritus 04/01/2017   Onychomycosis 05/12/2012   Lentigines 05/12/2012   Discoid lupus    Cancer (HCC)    Headache    Insomnia    Endometriosis    Osteopenia     ONSET DATE: script 04/21/24, diagnosed fall 2024  REFERRING DIAG:  R47.1 (ICD-10-CM) - Dysarthria    THERAPY DIAG:  Dysarthria and anarthria  Rationale for Evaluation and Treatment: Rehabilitation  SUBJECTIVE:   SUBJECTIVE STATEMENT: I didn't get (practicing) done as much as I wanted..  Pt accompanied by: self  PERTINENT HISTORY: See above   PAIN:  Are you having pain? Yes 5/10, mid back, aggravated by bending and UE movement, alleviated by heat, resting  FALLS: Has patient fallen in last 6 months?  Yes, Number of falls: one   PATIENT GOALS: improve speech ability  OBJECTIVE:  Note: Objective  measures were completed at Evaluation unless otherwise noted.  DIAGNOSTIC FINDINGS:  02/26/23 CLINICAL DATA:  Headache with increasing frequency or severity. EXAM: CT HEAD WITHOUT CONTRAST   TECHNIQUE: Contiguous axial images were obtained from the base of the skull through the vertex without intravenous contrast.   RADIATION DOSE REDUCTION: This exam was performed according to the departmental dose-optimization program which includes automated exposure control, adjustment of the mA and/or kV according to patient size and/or use of iterative reconstruction technique.   COMPARISON:  None Available.   FINDINGS: Brain: No evidence of acute infarction, hemorrhage, hydrocephalus, extra-axial collection or mass lesion/mass effect. Mild for age cerebral volume loss   Vascular: No hyperdense vessel or unexpected calcification.   Skull: Normal. Negative for fracture or focal lesion.   Sinuses/Orbits: No acute finding.   IMPRESSION: No acute or reversible finding.   PATIENT REPORTED OUTCOME MEASURES (PROM): Communication Effectiveness Survey: (CES) provided 05/19/24 and pt scored herself 16/32 with higher scores indicating more effective communication across situations. Pt scored herself 1 (not at all effective) with taking part in a conversation in a noisy environment.  TREATMENT DATE:   06/22/24: Pt not adhering to practice schedule but is still making attempt to practice consistently. SLP encouraged Andrea Burke to cont to strive to practice as directed and provided two suggestions to accomplish this. Today with sentence responses pt used intentional purposeful speech 90% of the time, avereage 70dB. Short Q and A conversational responses following pt's initial response resulted in pt producing average of 68dB. When asked to repeat or to use purposeful effortful  speech pt's volume improved to 71dB average. Pt provided with homework.   06/15/24: No more falls since eval. Pt has been practicing at home. Word level responses were 95% with intent noted. Sentence level responses were noted with intent/purpose/mindfulness 88% of the time - loudness 71dB average. Pt self corrected once, and improved her production to 100% when asked by SLP to repeat due to not using purpose with her answer. Pt stated she felt like she was shouting at someone so she didn't want to use intent. SLP used digital recorder x2 to use auditory feedback to demo to pt she was not shouting. She agreed with SLP that her speech was WNL volume. SLP showed her using diagram why she feels like she is shouting. Pt to cont to practice at sentence level.   06/12/24: To warm up SLP gave pt sentences to read with intent. Pt with 80% success. SLP targeted phrases and sentences with pt using intent and purpose in her speech. Pt demonstrated incr'd mindfulness with responses 75% of the time, improving articulation and making volume more consistently WNL range. SLP with explanation that pt's changing a habit to being more intentional and purpseful with her speech will benefit later as pt stated, If I do this why does it help? It's no different for the person I am talking to.   06/03/24: SLP targeted word level intent/purposeful speech with near 100% success. Pt  notably had difficulty with [employer]. Seemed concerned about this. SLP told pt to write down words she feels she gets tied up on and practice those  words if they are common words; SLP provided examples. SLP moved to 3-4 word phrases/sentences and pt success with WNL volume and clarity 90%. Pt to practice phrase level responses until next session. Pt cancelled some appointments after last session to bring some weeks to x1/week, and pt asked SLP if she could get them back. SLP asked pt to check with support rep.   05/19/24: SLP began session with providing  pt with CES. SLP began today with pt reading with more intent, purpose, and more deliberate. Average loudness was 69dB with SLP providing consistent mod-max A faded to occasional mod A for effort/intent. Specific cues not to be timid about speaking louder. SLP also used the phrase speak out. Pt to practice reading words with intent x20, BID.   05/12/24: SLP explained role in ST, need for pt to be more intentional and purposeful with speech, discussed pt's candidacy for skilled ST and based upon results today pt will be good candidate for improving speech loudness and clarity.  PATIENT EDUCATION: Education details: see treatment date this date, above Person educated: Patient Education method: Programmer, multimedia, Demonstration, and Verbal cues Education comprehension: verbalized understanding, returned demonstration, verbal cues required, and needs further education  HOME EXERCISE PROGRAM: Eventually intentional and deliberate speech   GOALS: Goals reviewed with patient? No  SHORT TERM GOALS: Target date:  07/07/24 due to visit number  At visit 9 pt will have improved PROM compared to initial measurement Baseline: Goal status:  INITIAL  2.  Pt will improve loudness to average 70dB in simple conversation in 2 sessions Baseline:  Goal status: INITIAL  3.  Pt will demo AB in 5 minutes at rest, 85% success in 2 sessions Baseline:  Goal status: INITIAL  4.  Pt will demo AB in sentence responses 85% success in 2 sessions Baseline: 06/15/24 Goal status: INITIAL   LONG TERM GOALS: Target date: 07/10/24  Pt will improve PROM compared to STG measurement Baseline:  Goal status: INITIAL  2.  Pt will demo AB in 5 minutes simple to mod complex conversation, 85% success, in 3 sessions Baseline:  Goal status: INITIAL   ASSESSMENT:  CLINICAL IMPRESSION: Patient is a 78 y.o. F who was seen today for treatment of speech in light of PD. See treatment date above for today's date for further  details on today's session. She is improving in ability to use more intent and in so doing, incr her speech clarity. Pt does not endorse s/sx dysphagia at this time.   OBJECTIVE IMPAIRMENTS: Objective impairments include dysarthria. These impairments are limiting patient from household responsibilities, ADLs/IADLs, and effectively communicating at home and in community.Factors affecting potential to achieve goals and functional outcome are none noted today.. Patient will benefit from skilled SLP services to address above impairments and improve overall function.  REHAB POTENTIAL: Good  PLAN:  SLP FREQUENCY: 2x/week  SLP DURATION: 8 weeks  PLANNED INTERVENTIONS: Environmental controls, Cueing hierachy, Internal/external aids, Oral motor exercises, Functional tasks, SLP instruction and feedback, Compensatory strategies, Patient/family education, and [id number] Treatment of speech (30 or 45 min)     Flor Houdeshell, CCC-SLP 06/22/2024, 12:32 PM

## 2024-06-24 ENCOUNTER — Ambulatory Visit

## 2024-06-24 DIAGNOSIS — R471 Dysarthria and anarthria: Secondary | ICD-10-CM

## 2024-06-24 DIAGNOSIS — R4701 Aphasia: Secondary | ICD-10-CM | POA: Diagnosis not present

## 2024-06-24 NOTE — Therapy (Signed)
 OUTPATIENT SPEECH LANGUAGE PATHOLOGY [facility]   Patient Name: Andrea Burke MRN: [id number] DOB:[birth year], 78 y.o., female Today's Date: 06/24/2024  PCP: Allen Lauraine CROME, PA-C REFERRING PROVIDER: Evonnie Asberry RAMAN, DO  END OF SESSION:  End of Session - 06/24/24 1457     Visit Number 7    Number of Visits 17    Date for SLP Re-Evaluation 07/10/24    SLP Start Time 1452    SLP Stop Time  1530    SLP Time Calculation (min) 38 min    Activity Tolerance Patient tolerated treatment well              Past Medical History:  Diagnosis Date   Cancer (HCC)    KIDNEY   Discoid lupus    Endometriosis    Headache(784.0)    Insomnia    Kidney disease    Lupus    Osteopenia    Urinary incontinence    Past Surgical History:  Procedure Laterality Date   CATARACT EXTRACTION, BILATERAL     PELVIC LAPAROSCOPY     DIAG LASER LAP-ENDOMETRIOSIS   REMOVAL OF LEFT KIDNEY  10/08/2006   Patient Active Problem List   Diagnosis Date Noted   Major depressive disorder, single episode, moderate (HCC) 08/06/2018   Moderate major depression (HCC) 06/23/2018   Anxiety disorder 06/23/2018   Pruritus 04/01/2017   Onychomycosis 05/12/2012   Lentigines 05/12/2012   Discoid lupus    Cancer (HCC)    Headache    Insomnia    Endometriosis    Osteopenia     ONSET DATE: script 04/21/24, diagnosed fall 2024  REFERRING DIAG:  R47.1 (ICD-10-CM) - Dysarthria    THERAPY DIAG:  Dysarthria and anarthria  Rationale for Evaluation and Treatment: Rehabilitation  SUBJECTIVE:   SUBJECTIVE STATEMENT: I didn't get (practicing) done as much as I wanted..  Pt accompanied by: self  PERTINENT HISTORY: See above   PAIN:  Are you having pain? Yes 5/10, mid back, aggravated by bending and UE movement, alleviated by heat, resting  FALLS: Has patient fallen in last 6 months?  Yes, Number of falls: one   PATIENT GOALS: improve speech ability  OBJECTIVE:  Note: Objective  measures were completed at Evaluation unless otherwise noted.  DIAGNOSTIC FINDINGS:  02/26/23 CLINICAL DATA:  Headache with increasing frequency or severity. EXAM: CT HEAD WITHOUT CONTRAST   TECHNIQUE: Contiguous axial images were obtained from the base of the skull through the vertex without intravenous contrast.   RADIATION DOSE REDUCTION: This exam was performed according to the departmental dose-optimization program which includes automated exposure control, adjustment of the mA and/or kV according to patient size and/or use of iterative reconstruction technique.   COMPARISON:  None Available.   FINDINGS: Brain: No evidence of acute infarction, hemorrhage, hydrocephalus, extra-axial collection or mass lesion/mass effect. Mild for age cerebral volume loss   Vascular: No hyperdense vessel or unexpected calcification.   Skull: Normal. Negative for fracture or focal lesion.   Sinuses/Orbits: No acute finding.   IMPRESSION: No acute or reversible finding.   PATIENT REPORTED OUTCOME MEASURES (PROM): Communication Effectiveness Survey: (CES) provided 05/19/24 and pt scored herself 16/32 with higher scores indicating more effective communication across situations. Pt scored herself 1 (not at all effective) with taking part in a conversation in a noisy environment.  TREATMENT DATE:   06/24/24: Pt admitted her practice was not 100% as she would have liked since last session. In phrase warm up pt used intent 90% of the time average 72dB. With sentneces from homework pt used intent 5/6, with average 70dB. Sentence responses with min-mod complex answers required average 70dB and intent noted 85% of the time; At times loudness decay heard. Homework provided. In simple conversation pt's volume decr'd to average 69dB with usual loudness decay.  06/22/24: Pt not  adhering to practice schedule but is still making attempt to practice consistently. SLP encouraged Andrea Burke to cont to strive to practice as directed and provided two suggestions to accomplish this. Today with sentence responses pt used intentional purposeful speech 90% of the time, average 70dB. Short Q and A conversational responses following pt's initial response resulted in pt producing average of 68dB. When asked to repeat or to use purposeful effortful speech pt's volume improved to 71dB average. Pt provided with homework.   06/15/24: No more falls since eval. Pt has been practicing at home. Word level responses were 95% with intent noted. Sentence level responses were noted with intent/purpose/mindfulness 88% of the time - loudness 71dB average. Pt self corrected once, and improved her production to 100% when asked by SLP to repeat due to not using purpose with her answer. Pt stated she felt like she was shouting at someone so she didn't want to use intent. SLP used digital recorder x2 to use auditory feedback to demo to pt she was not shouting. She agreed with SLP that her speech was WNL volume. SLP showed her using diagram why she feels like she is shouting. Pt to cont to practice at sentence level.   06/12/24: To warm up SLP gave pt sentences to read with intent. Pt with 80% success. SLP targeted phrases and sentences with pt using intent and purpose in her speech. Pt demonstrated incr'd mindfulness with responses 75% of the time, improving articulation and making volume more consistently WNL range. SLP with explanation that pt's changing a habit to being more intentional and purpseful with her speech will benefit later as pt stated, If I do this why does it help? It's no different for the person I am talking to.   06/03/24: SLP targeted word level intent/purposeful speech with near 100% success. Pt  notably had difficulty with [employer]. Seemed concerned about this. SLP told pt to write down words she  feels she gets tied up on and practice those  words if they are common words; SLP provided examples. SLP moved to 3-4 word phrases/sentences and pt success with WNL volume and clarity 90%. Pt to practice phrase level responses until next session. Pt cancelled some appointments after last session to bring some weeks to x1/week, and pt asked SLP if she could get them back. SLP asked pt to check with support rep.   05/19/24: SLP began session with providing pt with CES. SLP began today with pt reading with more intent, purpose, and more deliberate. Average loudness was 69dB with SLP providing consistent mod-max A faded to occasional mod A for effort/intent. Specific cues not to be timid about speaking louder. SLP also used the phrase speak out. Pt to practice reading words with intent x20, BID.   05/12/24: SLP explained role in ST, need for pt to be more intentional and purposeful with speech, discussed pt's candidacy for skilled ST and based upon results today pt will be good candidate for improving speech loudness and clarity.  PATIENT  EDUCATION: Education details: see treatment date this date, above Person educated: Patient Education method: Programmer, multimedia, Facilities manager, and Verbal cues Education comprehension: verbalized understanding, returned demonstration, verbal cues required, and needs further education  HOME EXERCISE PROGRAM: Eventually intentional and deliberate speech   GOALS: Goals reviewed with patient? No  SHORT TERM GOALS: Target date:  07/07/24 due to visit number  At visit 9 pt will have improved PROM compared to initial measurement Baseline: Goal status: INITIAL  2.  Pt will improve loudness to average 70dB in simple conversation in 2 sessions Baseline:  Goal status: INITIAL  3.  Pt will demo AB in 5 minutes at rest, 85% success in 2 sessions Baseline:  Goal status: INITIAL  4.  Pt will demo AB in sentence responses 85% success in 2 sessions Baseline: 06/15/24 Goal  status: INITIAL   LONG TERM GOALS: Target date: 07/10/24  Pt will improve PROM compared to STG measurement Baseline:  Goal status: INITIAL  2.  Pt will demo AB in 5 minutes simple to mod complex conversation, 85% success, in 3 sessions Baseline:  Goal status: INITIAL   ASSESSMENT:  CLINICAL IMPRESSION: Patient is a 78 y.o. F who was seen today for treatment of speech in light of PD. See treatment date above for today's date for further details on today's session. She is improving in ability to use more intent and in so doing, incr her speech clarity. Pt does not endorse s/sx dysphagia at this time.   OBJECTIVE IMPAIRMENTS: Objective impairments include dysarthria. These impairments are limiting patient from household responsibilities, ADLs/IADLs, and effectively communicating at home and in community.Factors affecting potential to achieve goals and functional outcome are none noted today.. Patient will benefit from skilled SLP services to address above impairments and improve overall function.  REHAB POTENTIAL: Good  PLAN:  SLP FREQUENCY: 2x/week  SLP DURATION: 8 weeks  PLANNED INTERVENTIONS: Environmental controls, Cueing hierachy, Internal/external aids, Oral motor exercises, Functional tasks, SLP instruction and feedback, Compensatory strategies, Patient/family education, and 07492 Treatment of speech (30 or 45 min)     Shakelia Scrivner, CCC-SLP 06/24/2024, 2:57 PM

## 2024-06-29 ENCOUNTER — Encounter

## 2024-07-01 ENCOUNTER — Ambulatory Visit

## 2024-07-01 DIAGNOSIS — R471 Dysarthria and anarthria: Secondary | ICD-10-CM | POA: Diagnosis not present

## 2024-07-01 DIAGNOSIS — R4701 Aphasia: Secondary | ICD-10-CM | POA: Diagnosis not present

## 2024-07-01 NOTE — Therapy (Signed)
 OUTPATIENT SPEECH LANGUAGE PATHOLOGY [facility]   Patient Name: Andrea Burke MRN: [id number] DOB:[birth year], 78 y.o., female Today's Date: 07/01/2024  PCP: Allen Lauraine CROME, PA-C REFERRING PROVIDER: Evonnie Asberry RAMAN, DO  END OF SESSION:  End of Session - 07/01/24 1459     Visit Number 8    Number of Visits 17    Date for Recertification  07/10/24    SLP Start Time 1451    SLP Stop Time  1530    SLP Time Calculation (min) 39 min    Activity Tolerance Patient tolerated treatment well              Past Medical History:  Diagnosis Date   Cancer (HCC)    KIDNEY   Discoid lupus    Endometriosis    Headache(784.0)    Insomnia    Kidney disease    Lupus    Osteopenia    Urinary incontinence    Past Surgical History:  Procedure Laterality Date   CATARACT EXTRACTION, BILATERAL     PELVIC LAPAROSCOPY     DIAG LASER LAP-ENDOMETRIOSIS   REMOVAL OF LEFT KIDNEY  10/08/2006   Patient Active Problem List   Diagnosis Date Noted   Major depressive disorder, single episode, moderate (HCC) 08/06/2018   Moderate major depression (HCC) 06/23/2018   Anxiety disorder 06/23/2018   Pruritus 04/01/2017   Onychomycosis 05/12/2012   Lentigines 05/12/2012   Discoid lupus    Cancer (HCC)    Headache    Insomnia    Endometriosis    Osteopenia     ONSET DATE: script 04/21/24, diagnosed fall 2024  REFERRING DIAG:  R47.1 (ICD-10-CM) - Dysarthria    THERAPY DIAG:  Dysarthria and anarthria  Rationale for Evaluation and Treatment: Rehabilitation  SUBJECTIVE:   SUBJECTIVE STATEMENT: I was a bad girl and didn't practice. (Pt's mother died over the weekend).  Pt accompanied by: self  PERTINENT HISTORY: See above   PAIN:  Are you having pain? Yes 4/10, mid back, aggravated by bending and UE movement, alleviated by heat, resting  FALLS: Has patient fallen in last 6 months?  Yes, Number of falls: one   PATIENT GOALS: improve speech ability  OBJECTIVE:   Note: Objective measures were completed at Evaluation unless otherwise noted.  DIAGNOSTIC FINDINGS:  02/26/23 CLINICAL DATA:  Headache with increasing frequency or severity. EXAM: CT HEAD WITHOUT CONTRAST   TECHNIQUE: Contiguous axial images were obtained from the base of the skull through the vertex without intravenous contrast.   RADIATION DOSE REDUCTION: This exam was performed according to the departmental dose-optimization program which includes automated exposure control, adjustment of the mA and/or kV according to patient size and/or use of iterative reconstruction technique.   COMPARISON:  None Available.   FINDINGS: Brain: No evidence of acute infarction, hemorrhage, hydrocephalus, extra-axial collection or mass lesion/mass effect. Mild for age cerebral volume loss   Vascular: No hyperdense vessel or unexpected calcification.   Skull: Normal. Negative for fracture or focal lesion.   Sinuses/Orbits: No acute finding.   IMPRESSION: No acute or reversible finding.   PATIENT REPORTED OUTCOME MEASURES (PROM): Communication Effectiveness Survey: (CES) provided 05/19/24 and pt scored herself 16/32 with higher scores indicating more effective communication across situations. Pt scored herself 1 (not at all effective) with taking part in a conversation in a noisy environment.  TREATMENT DATE:   07/01/24: SLP assisted pt in speaking with more intent and purpose. In phrase level practice for warm up pt average volume was 71dB, and in sentence warm up pt's voice was average 71dB. In multiple sentence responses pt maintained intentional speech 85% of the time, with average 70dB. Homework to extend to today's session. In simple conversation pt's average was 70dB.  06/24/24: Pt admitted her practice was not 100% as she would have liked since last session. In  phrase warm up pt used intent 90% of the time average 72dB. With sentneces from homework pt used intent 5/6, with average 70dB. Sentence responses with min-mod complex answers required average 70dB and intent noted 85% of the time; At times loudness decay heard. Homework provided. In simple conversation pt's volume decr'd to average 69dB with usual loudness decay.  06/22/24: Pt not adhering to practice schedule but is still making attempt to practice consistently. SLP encouraged [name] to cont to strive to practice as directed and provided two suggestions to accomplish this. Today with sentence responses pt used intentional purposeful speech 90% of the time, average 70dB. Short Q and A conversational responses following pt's initial response resulted in pt producing average of 68dB. When asked to repeat or to use purposeful effortful speech pt's volume improved to 71dB average. Pt provided with homework.   06/15/24: No more falls since eval. Pt has been practicing at home. Word level responses were 95% with intent noted. Sentence level responses were noted with intent/purpose/mindfulness 88% of the time - loudness 71dB average. Pt self corrected once, and improved her production to 100% when asked by SLP to repeat due to not using purpose with her answer. Pt stated she felt like she was shouting at someone so she didn't want to use intent. SLP used digital recorder x2 to use auditory feedback to demo to pt she was not shouting. She agreed with SLP that her speech was WNL volume. SLP showed her using diagram why she feels like she is shouting. Pt to cont to practice at sentence level.   06/12/24: To warm up SLP gave pt sentences to read with intent. Pt with 80% success. SLP targeted phrases and sentences with pt using intent and purpose in her speech. Pt demonstrated incr'd mindfulness with responses 75% of the time, improving articulation and making volume more consistently WNL range. SLP with explanation that  pt's changing a habit to being more intentional and purpseful with her speech will benefit later as pt stated, If I do this why does it help? It's no different for the person I am talking to.   06/03/24: SLP targeted word level intent/purposeful speech with near 100% success. Pt  notably had difficulty with [employer]. Seemed concerned about this. SLP told pt to write down words she feels she gets tied up on and practice those  words if they are common words; SLP provided examples. SLP moved to 3-4 word phrases/sentences and pt success with WNL volume and clarity 90%. Pt to practice phrase level responses until next session. Pt cancelled some appointments after last session to bring some weeks to x1/week, and pt asked SLP if she could get them back. SLP asked pt to check with support rep.   05/19/24: SLP began session with providing pt with CES. SLP began today with pt reading with more intent, purpose, and more deliberate. Average loudness was 69dB with SLP providing consistent mod-max A faded to occasional mod A for effort/intent. Specific cues not to be timid  about speaking louder. SLP also used the phrase speak out. Pt to practice reading words with intent x20, BID.   05/12/24: SLP explained role in ST, need for pt to be more intentional and purposeful with speech, discussed pt's candidacy for skilled ST and based upon results today pt will be good candidate for improving speech loudness and clarity.  PATIENT EDUCATION: Education details: see treatment date this date, above Person educated: Patient Education method: Programmer, multimedia, Demonstration, and Verbal cues Education comprehension: verbalized understanding, returned demonstration, verbal cues required, and needs further education  HOME EXERCISE PROGRAM: Eventually intentional and deliberate speech   GOALS: Goals reviewed with patient? No  SHORT TERM GOALS: Target date:  07/07/24 due to visit number  At visit 9 pt will have improved PROM  compared to initial measurement Baseline: Goal status: INITIAL  2.  Pt will improve loudness to average 70dB in simple conversation in 2 sessions Baseline:  Goal status: INITIAL  3.  Pt will demo AB in 5 minutes at rest, 85% success in 2 sessions Baseline:  Goal status: INITIAL  4.  Pt will demo AB in sentence responses 85% success in 2 sessions Baseline: 06/15/24 Goal status: INITIAL   LONG TERM GOALS: Target date: 07/10/24  Pt will improve PROM compared to STG measurement Baseline:  Goal status: INITIAL  2.  Pt will demo AB in 5 minutes simple to mod complex conversation, 85% success, in 3 sessions Baseline:  Goal status: INITIAL   ASSESSMENT:  CLINICAL IMPRESSION: Patient is a 78 y.o. F who was seen today for treatment of speech in light of PD. See treatment date above for today's date for further details on today's session. She is improving in ability to use more intent and in so doing, incr her speech clarity. Pt does not endorse s/sx dysphagia at this time.   OBJECTIVE IMPAIRMENTS: Objective impairments include dysarthria. These impairments are limiting patient from household responsibilities, ADLs/IADLs, and effectively communicating at home and in community.Factors affecting potential to achieve goals and functional outcome are none noted today.. Patient will benefit from skilled SLP services to address above impairments and improve overall function.  REHAB POTENTIAL: Good  PLAN:  SLP FREQUENCY: 2x/week  SLP DURATION: 8 weeks  PLANNED INTERVENTIONS: Environmental controls, Cueing hierachy, Internal/external aids, Oral motor exercises, Functional tasks, SLP instruction and feedback, Compensatory strategies, Patient/family education, and [id number] Treatment of speech (30 or 45 min)     Laurelin Elson, CCC-SLP 07/01/2024, 3:00 PM

## 2024-07-02 ENCOUNTER — Ambulatory Visit: Payer: Self-pay | Admitting: Psychology

## 2024-07-02 ENCOUNTER — Ambulatory Visit (INDEPENDENT_AMBULATORY_CARE_PROVIDER_SITE_OTHER): Admitting: Psychology

## 2024-07-02 ENCOUNTER — Institutional Professional Consult (permissible substitution): Payer: Self-pay | Admitting: Psychology

## 2024-07-02 DIAGNOSIS — R4189 Other symptoms and signs involving cognitive functions and awareness: Secondary | ICD-10-CM

## 2024-07-02 DIAGNOSIS — Z1231 Encounter for screening mammogram for malignant neoplasm of breast: Secondary | ICD-10-CM | POA: Diagnosis not present

## 2024-07-02 DIAGNOSIS — R419 Unspecified symptoms and signs involving cognitive functions and awareness: Secondary | ICD-10-CM

## 2024-07-02 NOTE — Progress Notes (Unsigned)
 NEUROPSYCHOLOGICAL EVALUATION . [facility]  [place] Department of Neurology  Date of Evaluation: 07/02/2024  REASON FOR REFERRAL   Andrea Burke is a*** ***-year-old, {KDEISSHANDEDNESS:32240}-handed, *** female with *** years of formal education. She was referred for neuropsychological evaluation by her neurologist, {KDEISSPROVIDERS1:32312}, to assess current neurocognitive functioning, document potential cognitive deficits, and assist with treatment planning ***. This is her first neuropsychological evaluation.  She previously underwent neuropsychological evaluation on *** at ***. {KDEISSPREVIOUSNP:32242}  SUMMARY OF RESULTS   Premorbid cognitive abilities are estimated to be in the *** range based on word reading and sociodemographic factors. ***  DIAGNOSTIC IMPRESSION   Results of the current evaluation indicated ***  ICD-10 Codes: ***  RECOMMENDATIONS   A repeat neuropsychological evaluation in *** months (or sooner if functional decline is noted) is recommended.  In consultation with your doctor, schedule cognitive reevaluation on an as-needed basis to assess for cognitive decline and update treatment recommendations. Reevaluation should occur during a period of medical and affective stability.***  ***Discuss with your neurologist the risks and benefits of starting a medication that can help slow memory decline.  ***Patient has already been prescribed a medication (i.e., ***) aimed at addressing memory concerns. She is encouraged to continue taking this medication as prescribed. It is important to highlight that this medication has been shown to slow functional decline in some individuals.  Since no one resides in the home who can casually oversee that instrumental activities of daily living are being completed appropriately, the patient is encouraged to use available tools and strategies--such as medication organizers, pillboxes, automated reminders, and  autopay services--to support the management of medications, finances, and medical appointments and to minimize the risk of errors.***  Findings from this evaluation raise concern about the patient's ability to safely drive.*** Performance across neurocognitive testing is not a strong predictor of an individual's safety operating a motor vehicle. Should *** family wish to pursue a formalized driving evaluation, they could reach out to the following agencies:  [facility] [employer] [facility] in [place]: [phone] [facility] in [place]: [phone] [facility] in [place]: [phone] [facility] in [place]: [phone] or [phone]  Continue treatment for ***, especially given that emotional distress can exacerbate cognitive difficulties. Discuss current medication regimen with your prescribing provider to ensure you are receiving maximum benefit. If symptoms begin to interfere with daily functioning, you may wish to reconsider exploring additional treatment options, such as mindfulness, relaxation techniques, or counseling.  Managing grief is important for overall well-being. You may find some of the following recommendations helpful:***  Allow yourself to grieve: It is important to give yourself permission to feel the emotions that come with grief. Everyone grieves differently, and there is no set timeline for when you "should" feel better. Allow yourself to experience sadness, anger, or even relief as part of the healing process. Seek support: Grief can feel isolating, but talking with someone you trust, whether it is a friend, family member, or counselor, can help you process your emotions. You might also consider joining a support group, where you can connect with others who understand what you are going through. Practice self-care: Take care of your physical health during this time. Regular exercise, a balanced diet, and adequate sleep can  help your body manage the stress of grief. It is also important to do things that make you feel good, whether it is reading, taking walks, or pursuing a hobby you enjoy. Find ways to honor the memory of your loved  one: Finding a way to celebrate the life of your loved one, such as creating a memory book or doing something they loved, can help keep their memory alive and bring you comfort. Focus on the present: While remembering your loved one is important, it can also help to stay present and focus on small moments of joy in your daily life. Engage in activities that bring you a sense of calm or happiness, even if it is just for a short time. Consider mindfulness or relaxation techniques: Practices such as mindfulness meditation, yoga, or deep breathing exercises can help you manage overwhelming emotions and find moments of peace in your day. Know that healing takes time: Grief is not something you "get over" - it is something you learn to live with. Over time, the pain may lessen, and you will find ways to adjust to life without your loved one, but it is important to remember that healing is not linear.  Given ongoing sleep difficulties, she may benefit from the implementation of sleep hygiene techniques, including:***  Go to bed and get up at the same time each day to help your body establish a regular rhythm. Establish and maintain a bedtime routine. Certain activities such as stretching, meditating, listening to soft music, or reading ~15 minutes before bedtime can be a great way to regularly get your brain and body ready for sleep. Avoid taking naps during the day. Avoid alcohol and caffeine for 5 or 6 hours before going to bed. Get regular exercise, but not in the hours before bedtime. Use comfortable bedding and maintain a cool temperature in your bedroom. Block out light and distracting noise. Avoid watching television or using your phone/computer in bed. Avoid staying in bed if you have  difficulty falling asleep. If you have not been able to get to sleep after about 20 minutes or more, get up and do something calming or boring until you feel sleepy, then return to bed and try again.  Prioritize physical health through diet, exercise, and sleep. Regular physical activity supports cardiovascular health, improves mood, and helps preserve mobility and independence. Aim for at least 150 minutes of moderate aerobic exercise per week (e.g., brisk walking, swimming, gardening). A brain-healthy diet such as the Mediterranean or MIND diet is rich in fruits, vegetables, whole grains, healthy fats, and lean proteins, and has been associated with reduced risk of cognitive decline. Additionally, getting adequate, quality sleep and managing chronic conditions with the help of healthcare providers are essential components of healthy aging.***  Continue to stay socially and mentally engaged. Maintaining strong social connections and regularly stimulating your brain can help protect against cognitive decline. This includes staying connected with friends and family, volunteering, or participating in community groups. Mentally engaging activities--such as reading, doing puzzles, playing strategy games, or learning a new language or musical instrument--promote brain plasticity. If you are interested in activities to support cognitive engagement, this site offers a variety of apps and games organized by difficulty level:***  https://www.[facility].org/get-to-know-barrow/centers-programs/neurorehabilitation-center/neuro-rehab-apps-and-games/  Consider implementing compensatory strategies to maximize independence and maintain daily functioning. Examples include:  Adhere to routine. Compensatory strategies work best when they are used consistently. Use a planner, calendar, or white board that has the schedule and important events for the day clearly listed to reference and cross off when tasks are complete.   Ask for written information, especially if it is new or unfamiliar (e.g., information provided at a doctor's appointment).  Create an organized environment. Keep items that can be easily misplaced  in a sensible location and get into the habit of always returning the items to those places. Pay attention and reduce distractions. Make a point of focusing attention on information you want to remember. One-on-one interaction is more likely to facilitate attention and minimize distraction. Make eye contact and repeat the information out loud after you hear it. Reduce interruptions or distractions especially when attempting to learn new information.  Create associations. When learning something new, think about and understand the information. Explain it in your own words or try to associate it with something you already know. Take notes to help remember important details. Evaluate goals and plan accordingly. When confronted by many different tasks, begin by making a list that prioritizes each task and estimates the time it will take to complete. Break down complicated tasks into smaller, more manageable steps. Focus on one task at a time and complete each task before starting another. Avoid multitasking.   {KDEISSDRIVINGRECS:32201}  {KDEISSDEMENTIARECS:32187}  {KDEISSHEALTHYLIVINGRECS:32196}  {KDEISSPSYCHRECS:32202}  {KDEISSREFERRALRECS:32193}  {KDEISSSLEEPRECS:32198}  {KDEISSSUBSTANCERECS:32199}  {KDEISSSENSORYRECS:32189}  {KDEISSACPRECS (Optional):32200}  {KDEISSCOGRECS:32216}  {KDEISSCAREGIVINGRECS:32194}  DISPOSITION   Patient will follow up with the referring provider, {KDEISSNEUROLOGISTS:32243}. {KDEISSFOLLOWUP:32247}. She and *** will be provided verbal feedback in approximately one week regarding the findings and impression during this visit.  The remainder of the report includes the details of the patient's background and a table of results from the current evaluation, which  support the summary and recommendations described above.  BACKGROUND   History of Presenting Illness: The following information was obtained from a review of medical records and an interview with the patient and ***. ***  Previous Neuropsychological Evaluation(s): ***  Cognitive Functioning: During today's appointment, the patient reported ***.  Physical Functioning: Patient denied difficulties with sleep initiation and maintenance. Appetite is stable. No changes to sense of taste or smell were reported. Vision and hearing are stable. ***RBD, falls, tremor, speech, incontinence, pain***  Emotional Functioning: Patient reported her current mood as ***. ***  Neuroimaging: MRI of the brain (***) documented ***.  Other Relevant Medical History: Remarkable for ***. Please refer to the medical record for a more comprehensive problem list. No history of stroke, CNS infection, head injury, or seizure was reported.***  Current Medications: Per patient, ***.  The patient denied taking medications.*** At this time, she reports only taking vitamins and supplements.***  Functional Status: Patient independently performs all ADLs and IADLs, including driving, without difficulty.***  Family Neurological History: Remarkable for ***  Psychiatric History: Remarkable for ***.  History of depression, anxiety, prior mental health treatment, suicidal ideation, hallucinations, and psychiatric hospitalizations was not reported.  Substance Use History: {KDEISSSUBSTANCES:32250}  Social and Developmental History: Patient was born in ***. History of perinatal complications and developmental delays was not reported. Language ***. Patient is *** and has *** children who live locally. Patient resides in ***.  Educational and Occupational History: No history of childhood learning disability, special education services, or grade retention was reported. Patient completed *** or obtained a *** degree. Patient was  employed as ***. Patient retired in [employer]  BEHAVIORAL OBSERVATIONS   Patient arrived {KDEISSBO1:32254} and was accompanied by ***. She ambulated {KDEISSBO2:32255}. She was alert and {KDEISSBO3:32256}. She was appropriately groomed and dressed for the setting. No significant sensory or motor abnormalities*** were observed. Vision and hearing*** were adequate for testing purposes. {KDEISSBO6:32259}. No conversational word-finding difficulties, paraphasic errors, or dysarthria were observed. Comprehension was conversationally intact. Thought processes were {KDEISSBO4:32257}. Thought content was organized and devoid of delusions. Insight appeared {KDEISSBO5:32258}. Affect  was *** and congruent with {KDEISSBO7:32260} mood. She was cooperative and gave adequate effort during testing, including on standalone and embedded measures of performance validity***. Results are thought to accurately reflect her cognitive functioning at this time.  NEUROPSYCHOLOGICAL TESTING RESULTS   Tests Administered: {KDEISSNPTESTS:32266}  Test results are provided in the table below. Whenever possible, the patient's scores were compared against age-, sex-, and education-corrected normative samples. Interpretive descriptions are based on the AACN consensus conference statement on uniform labeling (Guilmette et al., 2020).  PREMORBID FUNCTIONING RAW  RANGE  TOPF 56 StdS=114 High Average  ATTENTION & WORKING MEMORY RAW  RANGE  WAIS-5 Digits Forward -- ss=7 Low Average  WAIS-5 Digits Backward -- ss=14 High Average  WAIS-5 Digit Sequencing -- ss=9 Average  WAIS-5 Arithmetic -- ss=12 High Average  PROCESSING SPEED RAW  RANGE  Trails A 36''0e T=49 Average  WAIS-5 Naming Speed Quantity -- ss=12 High Average  DKEFS CWIT Color Naming 33''0e ss=10 Average  DKEFS CWIT Word Reading 26''1e ss=10 Average  EXECUTIVE FUNCTION RAW  RANGE  Trails B 76''0e T=52 Average  WAIS-5 Matrix Reasoning -- ss=10 Average  WAIS-5 Similarities --  ss=13 High Average  COWAT Letter Fluency 11+6+10 T=37 Low Average  DKEFS CWIT Inhibition 63''3e ss=12 High Average  DKEFS CWIT Inhibition/Switching 60''0e ss=13 High Average  LANGUAGE RAW  RANGE  COWAT Letter Fluency 11+6+10 T=37 Low Average  Animal Naming Test 17 T=47 Average  NAB Naming Test 28/31 T=40 WNL  VISUOSPATIAL RAW  RANGE  WAIS-5 Block Design -- ss=12 High Average  JLO C/S=26/30 56%ile Average  VERBAL LEARNING & MEMORY RAW  RANGE  HVLT Learning Trials (6+9+11)/36 T=55 Average  HVLT Delayed Recall 10/12 T=56 Average  HVLT Recognition Hits 10 -- --  HVLT Recognition False Positives 0 -- --  HVLT Discrimination Index 10 T=48 Average  WMS-IV LM-I  (8+12+13)/53 ss=11 Average  WMS-IV LM-II  (9+10)/39 ss=11 Average  WMS-IV LM Recognition  (8+11)/23 51-75%ile Average  VISUAL LEARNING & MEMORY RAW  RANGE  NAB Shape Learning Immediate Recognition (7+8+8)/27 T=72 Exceptionally High  NAB Shape Learning Delayed Recognition 7/9 T=60 High Average  NAB Shape Learning Delayed RDI 7h, 32fa T=49 Average  QUESTIONNAIRES RAW  RANGE  GDS-SF 7 -- Mild  GAS-10 13 -- Severe  *Note: ss = scaled score; StdS = standard score; T = t-score; C/S = corrected raw score; WNL = within normal limits; BNL= below normal limits; D/C = discontinued. Scores from skewed distributions are typically interpreted as WNL (>=16th %ile) or BNL (<16th %ile).   INFORMED CONSENT   Patient was provided with a verbal description of the nature and purpose of the neuropsychological evaluation. Also reviewed were the foreseeable risks and/or discomforts and benefits of the procedure, limits of confidentiality, and mandatory reporting requirements of this provider. Patient was given the opportunity to have their questions answered. Oral consent to participate was provided by the patient.   This report was prepared as part of a clinical evaluation and is not intended for forensic use.  SERVICE   This evaluation was conducted by  Renda Beckwith, Psy.D. In addition to time spent directly with the patient, total professional time (120 minutes) includes record review, integration of relevant medical history, test selection, interpretation of findings, and report preparation. A technician, Lonell Jude, B.S., provided testing and scoring assistance (165 minutes).  Psychiatric Diagnostic Evaluation Services (Professional): 09208 x 1 Neuropsychological Testing Evaluation Services (Professional): 03867 x 1 Neuropsychological Testing Evaluation Services (Professional): 03866 x 1 Neuropsychological Test Administration and  Scoring Radiographer, therapeutic): [phone] x 1 Neuropsychological Test Administration and Scoring (Technician): [phone] x 4  This report was generated using voice recognition software. While this document has been carefully reviewed, transcription errors may be present. I apologize in advance for any inconvenience. Please contact me if further clarification is needed.            Renda Beckwith, Psy.D.             Neuropsychologist

## 2024-07-02 NOTE — Progress Notes (Signed)
   Psychometrician Note   Cognitive testing was administered to Andrea Burke by Lonell Jude, B.S. (psychometrist) under the supervision of Dr. Renda Beckwith, Psy.D., licensed psychologist on 07/02/2024. Andrea Burke did not appear overtly distressed by the testing session per behavioral observation or responses across self-report questionnaires. Rest breaks were offered.   The battery of tests administered was selected by Dr. Renda Beckwith, Psy.D. with consideration to Andrea Burke's current level of functioning, the nature of her symptoms, emotional and behavioral responses during interview, level of literacy, observed level of motivation/effort, and the nature of the referral question. This battery was communicated to the psychometrist. Communication between Dr. Renda Beckwith, Psy.D. and the psychometrist was ongoing throughout the evaluation and Dr. Renda Beckwith, Psy.D. was immediately accessible at all times. Dr. Renda Beckwith, Psy.D. provided supervision to the psychometrist on the date of this service to the extent necessary to assure the quality of all services provided.    Andrea Burke will return within approximately 1-2 weeks for an interactive feedback session with Dr. Beckwith at which time her test performances, clinical impressions, and treatment recommendations will be reviewed in detail. Andrea Burke understands she can contact our office should she require our assistance before this time.  A total of 165 minutes of billable time were spent face-to-face with Andrea Burke by the psychometrist. This includes both test administration and scoring time. Billing for these services is reflected in the clinical report generated by Dr. Renda Beckwith, Psy.D.  This note reflects time spent with the psychometrician and does not include test scores or any clinical interpretations made by Dr. Beckwith. The full report will follow in a separate note.

## 2024-07-06 ENCOUNTER — Institutional Professional Consult (permissible substitution): Payer: Medicare Other | Admitting: Psychology

## 2024-07-06 ENCOUNTER — Ambulatory Visit: Payer: Self-pay

## 2024-07-06 ENCOUNTER — Ambulatory Visit

## 2024-07-06 DIAGNOSIS — R471 Dysarthria and anarthria: Secondary | ICD-10-CM

## 2024-07-06 DIAGNOSIS — R4701 Aphasia: Secondary | ICD-10-CM

## 2024-07-06 NOTE — Therapy (Signed)
 OUTPATIENT SPEECH LANGUAGE PATHOLOGY [facility]   Patient Name: Andrea Burke MRN: [id number] DOB:22-Dec-1945, 78 y.o., female Today's Date: 07/06/2024  PCP: Allen Lauraine CROME, PA-C REFERRING PROVIDER: Evonnie Asberry RAMAN, DO  END OF SESSION:  End of Session - 07/06/24 1449     Visit Number 9    Number of Visits 17    Date for Recertification  07/10/24    SLP Start Time 1449    SLP Stop Time  1530    SLP Time Calculation (min) 41 min    Activity Tolerance Patient tolerated treatment well              Past Medical History:  Diagnosis Date   Cancer (HCC)    KIDNEY   Discoid lupus    Endometriosis    Headache(784.0)    Insomnia    Kidney disease    Lupus    Osteopenia    Urinary incontinence    Past Surgical History:  Procedure Laterality Date   CATARACT EXTRACTION, BILATERAL     PELVIC LAPAROSCOPY     DIAG LASER LAP-ENDOMETRIOSIS   REMOVAL OF LEFT KIDNEY  10/08/2006   Patient Active Problem List   Diagnosis Date Noted   Major depressive disorder, single episode, moderate (HCC) 08/06/2018   Moderate major depression (HCC) 06/23/2018   Anxiety disorder 06/23/2018   Pruritus 04/01/2017   Onychomycosis 05/12/2012   Lentigines 05/12/2012   Discoid lupus    Cancer (HCC)    Headache    Insomnia    Endometriosis    Osteopenia     ONSET DATE: script 04/21/24, diagnosed fall 2024  REFERRING DIAG:  R47.1 (ICD-10-CM) - Dysarthria    THERAPY DIAG:  Dysarthria and anarthria  Rationale for Evaluation and Treatment: Rehabilitation  SUBJECTIVE:   SUBJECTIVE STATEMENT: It's not in my mind a lot. (But admits that purpose and intent with speaking is, rarely, in her mind)  Pt accompanied by: self  PERTINENT HISTORY: See above   PAIN:  Are you having pain? Yes 3/10, mid back, aggravated by bending and UE movement, alleviated by heat, resting  FALLS: Has patient fallen in last 6 months?  Yes, Number of falls: one   PATIENT GOALS: improve  speech ability  OBJECTIVE:  Note: Objective measures were completed at Evaluation unless otherwise noted.  DIAGNOSTIC FINDINGS:  02/26/23 CLINICAL DATA:  Headache with increasing frequency or severity. EXAM: CT HEAD WITHOUT CONTRAST   TECHNIQUE: Contiguous axial images were obtained from the base of the skull through the vertex without intravenous contrast.   RADIATION DOSE REDUCTION: This exam was performed according to the departmental dose-optimization program which includes automated exposure control, adjustment of the mA and/or kV according to patient size and/or use of iterative reconstruction technique.   COMPARISON:  None Available.   FINDINGS: Brain: No evidence of acute infarction, hemorrhage, hydrocephalus, extra-axial collection or mass lesion/mass effect. Mild for age cerebral volume loss   Vascular: No hyperdense vessel or unexpected calcification.   Skull: Normal. Negative for fracture or focal lesion.   Sinuses/Orbits: No acute finding.   IMPRESSION: No acute or reversible finding.   PATIENT REPORTED OUTCOME MEASURES (PROM): Communication Effectiveness Survey: (CES) provided 05/19/24 and pt scored herself 16/32 with higher scores indicating more effective communication across situations. Pt scored herself 1 (not at all effective) with taking part in a conversation in a noisy environment.  TREATMENT DATE:   07/06/24: SLP used min A initially for pt to use intent and deliberate speech to improve volume and clarity of speech. [name] benefited from rare min A for intent in 2-4 minute conversational segments x10. Pt was provided homework for multisentence responses using intent and purpose, lifting voice.   07/01/24: SLP assisted pt in speaking with more intent and purpose. In phrase level practice for warm up pt average volume was 71dB, and  in sentence warm up pt's voice was average 71dB. In multiple sentence responses pt maintained intentional speech 85% of the time, with average 70dB. Homework to extend to today's session. In simple conversation pt's average was 70dB.  06/24/24: Pt admitted her practice was not 100% as she would have liked since last session. In phrase warm up pt used intent 90% of the time average 72dB. With sentneces from homework pt used intent 5/6, with average 70dB. Sentence responses with min-mod complex answers required average 70dB and intent noted 85% of the time; At times loudness decay heard. Homework provided. In simple conversation pt's volume decr'd to average 69dB with usual loudness decay.  06/22/24: Pt not adhering to practice schedule but is still making attempt to practice consistently. SLP encouraged Andrea Burke to cont to strive to practice as directed and provided two suggestions to accomplish this. Today with sentence responses pt used intentional purposeful speech 90% of the time, average 70dB. Short Q and A conversational responses following pt's initial response resulted in pt producing average of 68dB. When asked to repeat or to use purposeful effortful speech pt's volume improved to 71dB average. Pt provided with homework.   06/15/24: No more falls since eval. Pt has been practicing at home. Word level responses were 95% with intent noted. Sentence level responses were noted with intent/purpose/mindfulness 88% of the time - loudness 71dB average. Pt self corrected once, and improved her production to 100% when asked by SLP to repeat due to not using purpose with her answer. Pt stated she felt like she was shouting at someone so she didn't want to use intent. SLP used digital recorder x2 to use auditory feedback to demo to pt she was not shouting. She agreed with SLP that her speech was WNL volume. SLP showed her using diagram why she feels like she is shouting. Pt to cont to practice at sentence level.    06/12/24: To warm up SLP gave pt sentences to read with intent. Pt with 80% success. SLP targeted phrases and sentences with pt using intent and purpose in her speech. Pt demonstrated incr'd mindfulness with responses 75% of the time, improving articulation and making volume more consistently WNL range. SLP with explanation that pt's changing a habit to being more intentional and purpseful with her speech will benefit later as pt stated, If I do this why does it help? It's no different for the person I am talking to.   06/03/24: SLP targeted word level intent/purposeful speech with near 100% success. Pt  notably had difficulty with [employer]. Seemed concerned about this. SLP told pt to write down words she feels she gets tied up on and practice those  words if they are common words; SLP provided examples. SLP moved to 3-4 word phrases/sentences and pt success with WNL volume and clarity 90%. Pt to practice phrase level responses until next session. Pt cancelled some appointments after last session to bring some weeks to x1/week, and pt asked SLP if she could get them back. SLP asked pt to check  with support rep.   05/19/24: SLP began session with providing pt with CES. SLP began today with pt reading with more intent, purpose, and more deliberate. Average loudness was 69dB with SLP providing consistent mod-max A faded to occasional mod A for effort/intent. Specific cues not to be timid about speaking louder. SLP also used the phrase speak out. Pt to practice reading words with intent x20, BID.   05/12/24: SLP explained role in ST, need for pt to be more intentional and purposeful with speech, discussed pt's candidacy for skilled ST and based upon results today pt will be good candidate for improving speech loudness and clarity.  PATIENT EDUCATION: Education details: see treatment date this date, above Person educated: Patient Education method: Programmer, multimedia, Demonstration, and Verbal cues Education  comprehension: verbalized understanding, returned demonstration, verbal cues required, and needs further education  HOME EXERCISE PROGRAM: Eventually intentional and deliberate speech   GOALS: Goals reviewed with patient? No  SHORT TERM GOALS: Target date:  07/07/24 due to visit number  At visit 9 pt will have improved PROM compared to initial measurement Baseline: Goal status: INITIAL  2.  Pt will improve loudness to average 70dB in simple conversation in 2 sessions Baseline:  Goal status: INITIAL  3.  Pt will demo AB in 5 minutes at rest, 85% success in 2 sessions Baseline:  Goal status: INITIAL  4.  Pt will demo AB in sentence responses 85% success in 2 sessions Baseline: 06/15/24 Goal status: INITIAL   LONG TERM GOALS: Target date: 07/10/24  Pt will improve PROM compared to STG measurement Baseline:  Goal status: INITIAL  2.  Pt will demo AB in 5 minutes simple to mod complex conversation, 85% success, in 3 sessions Baseline:  Goal status: INITIAL   ASSESSMENT:  CLINICAL IMPRESSION: Patient is a 78 y.o. F who was seen today for treatment of speech in light of PD. See treatment date above for today's date for further details on today's session. She continues improvement in ability to use more intent and in so doing, incr her speech clarity. Pt does not endorse s/sx dysphagia at this time.   OBJECTIVE IMPAIRMENTS: Objective impairments include dysarthria. These impairments are limiting patient from household responsibilities, ADLs/IADLs, and effectively communicating at home and in community.Factors affecting potential to achieve goals and functional outcome are none noted today.. Patient will benefit from skilled SLP services to address above impairments and improve overall function.  REHAB POTENTIAL: Good  PLAN:  SLP FREQUENCY: 2x/week  SLP DURATION: 8 weeks  PLANNED INTERVENTIONS: Environmental controls, Cueing hierachy, Internal/external aids, Oral motor  exercises, Functional tasks, SLP instruction and feedback, Compensatory strategies, Patient/family education, and [id number] Treatment of speech (30 or 45 min)     Yerick Eggebrecht, CCC-SLP 07/06/2024, 2:50 PM

## 2024-07-07 ENCOUNTER — Ambulatory Visit

## 2024-07-07 DIAGNOSIS — F321 Major depressive disorder, single episode, moderate: Secondary | ICD-10-CM | POA: Diagnosis not present

## 2024-07-07 DIAGNOSIS — E785 Hyperlipidemia, unspecified: Secondary | ICD-10-CM | POA: Diagnosis not present

## 2024-07-08 ENCOUNTER — Encounter: Payer: Medicare Other | Admitting: Psychology

## 2024-07-09 ENCOUNTER — Ambulatory Visit: Admitting: Psychiatry

## 2024-07-09 ENCOUNTER — Encounter

## 2024-07-09 DIAGNOSIS — F331 Major depressive disorder, recurrent, moderate: Secondary | ICD-10-CM

## 2024-07-09 NOTE — Progress Notes (Signed)
 [facility] Counselor/Therapist Progress Note  Patient ID: Andrea Burke, MRN: [id number],    Date: 07/09/2024  Time Spent: 53 minutes   Treatment Type: Individual Therapy  Reported Symptoms: grief over mother's death this past week, anxiety, depression , worries a lot about herself and her husband's health (he is to have knee replacement surgery in Nov.)    Mental Status Exam:  Appearance:   Casual and Neat     Behavior:  Appropriate, Sharing, and Motivated  Motor:  Some motor skill issues and states she is careful in her moving about; walking ok into office today  Speech/Language:   Clear and Coherent  Affect:  Depressed and anxious  Mood:  anxious and depressed  Thought process:  goal directed  Thought content:    Rumination  Sensory/Perceptual disturbances:    WNL  Orientation:  oriented to person, place, time/date, situation, day of week, month of year, year, and stated date of Oct. 2, 2025  Attention:  Fair  Concentration:  Fair  Memory:  Fair, went to wrong parking lot recently  [employer] of knowledge:   Good and Fair  Insight:    Good and Fair  Judgment:   Good  Impulse Control:  Good and Fair   Risk Assessment: Danger to Self:  No Self-injurious Behavior: No Danger to Others: No Duty to Warn:no Physical Aggression / Violence:No  Access to Firearms a concern: No  Gang Involvement:No   Subjective:  Patient today reports main symptoms most recently as grief re: mom's death last week and funeral to be this coming week, sadness, depression, and anxiety. Needed session today to share and process her grief over loss of her elderly mother, and did tearfully process a lot of her grief and sadness.  Being able to do this processing of her grief in an environment here where she can be supported, seem to be very helpful for her.  Will be going to the funeral probably in [place] next week and and we are scheduling a time after that to meet for her next session.  Patient  still having some anxiety and concerns about as me and my husband age and is starting to think a little bit about where they might want to be living at that point including being closer to relatives out of town.  Some issues remain with husband although more recently he has seemed a little better and is actually done some volunteer work at the hospital in the waiting room area.  Less avoidance of issues today.  Did well and working on her grief regarding the loss of her mother.   Interventions: Cognitive Behavioral Therapy, Solution-Oriented/Positive Psychology, and Ego-Supportive Long-Term goal: (measurable)  Enhance the ability to handle effectively the full variety of life's anxieties. Patient will eventually rate herself as a 3 or under on a 1-10 scale for depression and for anxiety, for a period of at least 2 months.  Strategies:  Patient will work on re-framing thoughts that have been leading her to feel more anxious.  The re-framed thoughts will support more calmness and personal empowerment for patient.     Short Term goal:  Increase understanding of beliefs and messages that produce worry and anxiety. Strategies: Patient will work with therapist to gain more understanding of how anxiety and worry are supported by certain beliefs and messages.  She will be able to give at least 3 examples of such messages in an upcoming session.   Diagnosis:   ICD-10-CM  1. Major depressive disorder, recurrent episode, moderate (HCC)  F33.1       Plan: Patient today in session and her first words were sharing the loss of her mother who died a few days ago.  Worked on her grief today in session as this was obviously the most important thing for her.  She was very open and shared a lot of her sadness, that she does not normally do as she is a very private person.  I encouraged her and her sharing of feelings and it did seem to help her to vent and share not only her sadness but also some positive  memories.  Talked about the closeness that she had shared with her mother for many years and that she missed talking to her by about 1 hour on the night she died.  Continues to follow through on her doctors appointments regarding Parkinson's disease and remains supportive of her husband and his medical issues including some issues related to cancer.  Patient much more open today in session than usual and seem to notice feeling more relieved and less on edge by end of session.  Will be seeing a lot of her family and friends this coming week as they travel out of town for her mother's funeral.  To follow-up again within approximately 2 weeks.Reminded and encouraged patient to be maintaining contact with people who are supportive of her, remain on her prescribed medications, and recognize the strength that she continues to show with goal-directed behaviors that enable her to move in a forward direction and supports her overall improved emotional health, and her outlook into the future.  Namrata Dangler is continuing to make progress and needs to keep working on her goal-directed behaviors to move in a  healthier and more hopeful direction as she works to remain motivated and focused on her therapy goals helping her move in a forward direction into the future.  Goal review and progress/challenges noted with patient.  Next appointment within 4 weeks.   Barnie Bunde, LCSW

## 2024-07-14 ENCOUNTER — Ambulatory Visit (INDEPENDENT_AMBULATORY_CARE_PROVIDER_SITE_OTHER): Admitting: Psychology

## 2024-07-14 DIAGNOSIS — R419 Unspecified symptoms and signs involving cognitive functions and awareness: Secondary | ICD-10-CM | POA: Diagnosis not present

## 2024-07-14 NOTE — Progress Notes (Signed)
   NEUROPSYCHOLOGY FEEDBACK SESSION . [facility]   Department of Neurology  Date of Feedback Session: 07/14/2024  REASON FOR REFERRAL   Andrea Burke is a 78 year old, right-handed, Asian female with 16 years of formal education. She was referred for neuropsychological evaluation by her neurologist, Asberry Tat, D.O., to assess current neurocognitive functioning, document potential cognitive deficits, and assist with treatment planning in the setting of Parkinson's disease (diagnosed in 2024) and mood disturbance. This is her first neuropsychological evaluation.  FEEDBACK   Patient completed a comprehensive neuropsychological evaluation on 07/02/2024. Please refer to that encounter for the full report and recommendations. Briefly, results indicated normal cognitive functioning, with no impairments observed in any domain. Her overall profile reflects well-preserved cognitive and functional abilities. Additionally, there is no emerging pattern suggestive of Parkinson's-related cognitive decline at this time. Subjective cognitive concerns are most likely attributable to normal aging and personal factors, including depression, anxiety, recent grief, and tendencies toward self-critical appraisal. Her heightened awareness of subtle cognitive changes may also reflect increased sensitivity to minor fluctuations that remain within the range of typical cognitive function.   Today, the patient was accompanied by her husband. They were provided verbal feedback regarding the findings and impression during this visit, and their questions were answered. A copy of the report was provided at the conclusion of the visit.  DISPOSITION   Patient will follow up with the referring provider, Dr. Evonnie. No follow-up neuropsychological testing was scheduled at this time. Please feel free to refer the patient for repeated evaluation if she shows a significant change in neurocognitive  status.  SERVICE   This feedback session was conducted by Renda Beckwith, Psy.D. [place] of [id number] (40 minutes) was billed for Dr. Beckwith' time spent in preparing, conducting, and documenting the current feedback session.  This report was generated using voice recognition software. While this document has been carefully reviewed, transcription errors may be present. I apologize in advance for any inconvenience. Please contact me if further clarification is needed.

## 2024-07-15 ENCOUNTER — Ambulatory Visit: Attending: Neurology

## 2024-07-15 DIAGNOSIS — R4701 Aphasia: Secondary | ICD-10-CM | POA: Diagnosis not present

## 2024-07-15 DIAGNOSIS — R471 Dysarthria and anarthria: Secondary | ICD-10-CM | POA: Insufficient documentation

## 2024-07-15 NOTE — Therapy (Unsigned)
 OUTPATIENT SPEECH LANGUAGE PATHOLOGY [facility]   Patient Name: Andrea Burke MRN: [id number] DOB:04-Oct-1946, 78 y.o., female Today's Date: 07/16/2024  PCP: Allen Lauraine CROME, PA-C REFERRING PROVIDER: Evonnie Asberry RAMAN, DO  END OF SESSION:  End of Session - 07/15/24 0940     Visit Number 10    Number of Visits 17    Date for Recertification  08/14/24    SLP Start Time 0935    SLP Stop Time  1015    SLP Time Calculation (min) 40 min    Activity Tolerance Patient tolerated treatment well               Past Medical History:  Diagnosis Date   Cancer (HCC)    KIDNEY   Discoid lupus    Endometriosis    Headache(784.0)    Insomnia    Kidney disease    Lupus    Osteopenia    Urinary incontinence    Past Surgical History:  Procedure Laterality Date   CATARACT EXTRACTION, BILATERAL     PELVIC LAPAROSCOPY     DIAG LASER LAP-ENDOMETRIOSIS   REMOVAL OF LEFT KIDNEY  10/08/2006   Patient Active Problem List   Diagnosis Date Noted   Major depressive disorder, single episode, moderate (HCC) 08/06/2018   Moderate major depression (HCC) 06/23/2018   Anxiety disorder 06/23/2018   Pruritus 04/01/2017   Onychomycosis 05/12/2012   Lentigines 05/12/2012   Discoid lupus    Cancer (HCC)    Headache    Insomnia    Endometriosis    Osteopenia    Speech Therapy Progress Note  Dates of Reporting Period: 05/12/24 to present  Subjective Statement: Pt has been seen for 10 visits ST focusing on more effortful and intentional speech volume. She has not been completely adherent to recommended practice schedule - partly due to a family emergency during this period.  Objective: See below  Goal Update: See below  Plan: Cont to see pt 1-2/week for 17 total sessions  Reason Skilled Services are Required: Pt has not reached full potential this therapy course.   ONSET DATE: script 04/21/24, diagnosed fall 2024  REFERRING DIAG:  R47.1 (ICD-10-CM) -  Dysarthria    THERAPY DIAG:  Dysarthria and anarthria  Rationale for Evaluation and Treatment: Rehabilitation  SUBJECTIVE:   SUBJECTIVE STATEMENT: Pt told SLP she was not effective with her homework since last session.  Pt accompanied by: self  PERTINENT HISTORY: See above   PAIN:  Are you having pain? No  FALLS: Has patient fallen in last 6 months?  Yes, Number of falls: one   PATIENT GOALS: improve speech ability  OBJECTIVE:  Note: Objective measures were completed at Evaluation unless otherwise noted.  DIAGNOSTIC FINDINGS:  02/26/23 CLINICAL DATA:  Headache with increasing frequency or severity. EXAM: CT HEAD WITHOUT CONTRAST   TECHNIQUE: Contiguous axial images were obtained from the base of the skull through the vertex without intravenous contrast.   RADIATION DOSE REDUCTION: This exam was performed according to the departmental dose-optimization program which includes automated exposure control, adjustment of the mA and/or kV according to patient size and/or use of iterative reconstruction technique.   COMPARISON:  None Available.   FINDINGS: Brain: No evidence of acute infarction, hemorrhage, hydrocephalus, extra-axial collection or mass lesion/mass effect. Mild for age cerebral volume loss   Vascular: No hyperdense vessel or unexpected calcification.   Skull: Normal. Negative for fracture or focal lesion.   Sinuses/Orbits: No acute finding.   IMPRESSION: No acute or reversible  finding.   PATIENT REPORTED OUTCOME MEASURES (PROM): Communication Effectiveness Survey: (CES) provided 05/19/24 and pt scored herself 16/32 with higher scores indicating more effective communication across situations. Pt scored herself 1 (not at all effective) with taking part in a conversation in a noisy environment.  In re-administration on 07/15/24 pt scored 18/32, with 3 (mostly effective) with having a conversation with family member at home, and having a  conversation with a familiar person over the phone. All other responses were 2 (somewhat effective).                                                                                                                            TREATMENT DATE:   07/15/24: Pt filled out PROM today - scored 18/32, greater than initial administration. Today pt maintained WNL volume with sentence and two-sentence responses, and was aware of lower than WNL responses demonstrated by self correcting 80% of the time. AB was noted in sentence responses 90%. Homework provided.  07/06/24: SLP used min A initially for pt to use intent and deliberate speech to improve volume and clarity of speech. Anvita benefited from rare min A for intent in 2-4 minute conversational segments x10. Pt was provided homework for multisentence responses using intent and purpose, lifting voice.   07/01/24: SLP assisted pt in speaking with more intent and purpose. In phrase level practice for warm up pt average volume was 71dB, and in sentence warm up pt's voice was average 71dB. In multiple sentence responses pt maintained intentional speech 85% of the time, with average 70dB. Homework to extend to today's session. In simple conversation pt's average was 70dB.  06/24/24: Pt admitted her practice was not 100% as she would have liked since last session. In phrase warm up pt used intent 90% of the time average 72dB. With sentneces from homework pt used intent 5/6, with average 70dB. Sentence responses with min-mod complex answers required average 70dB and intent noted 85% of the time; At times loudness decay heard. Homework provided. In simple conversation pt's volume decr'd to average 69dB with usual loudness decay.  06/22/24: Pt not adhering to practice schedule but is still making attempt to practice consistently. SLP encouraged Caidynce to cont to strive to practice as directed and provided two suggestions to accomplish this. Today with sentence responses pt  used intentional purposeful speech 90% of the time, average 70dB. Short Q and A conversational responses following pt's initial response resulted in pt producing average of 68dB. When asked to repeat or to use purposeful effortful speech pt's volume improved to 71dB average. Pt provided with homework.   06/15/24: No more falls since eval. Pt has been practicing at home. Word level responses were 95% with intent noted. Sentence level responses were noted with intent/purpose/mindfulness 88% of the time - loudness 71dB average. Pt self corrected once, and improved her production to 100% when asked by SLP to repeat due to not using purpose with her answer. Pt stated  she felt like she was shouting at someone so she didn't want to use intent. SLP used digital recorder x2 to use auditory feedback to demo to pt she was not shouting. She agreed with SLP that her speech was WNL volume. SLP showed her using diagram why she feels like she is shouting. Pt to cont to practice at sentence level.   06/12/24: To warm up SLP gave pt sentences to read with intent. Pt with 80% success. SLP targeted phrases and sentences with pt using intent and purpose in her speech. Pt demonstrated incr'd mindfulness with responses 75% of the time, improving articulation and making volume more consistently WNL range. SLP with explanation that pt's changing a habit to being more intentional and purpseful with her speech will benefit later as pt stated, If I do this why does it help? It's no different for the person I am talking to.   06/03/24: SLP targeted word level intent/purposeful speech with near 100% success. Pt  notably had difficulty with [employer]. Seemed concerned about this. SLP told pt to write down words she feels she gets tied up on and practice those  words if they are common words; SLP provided examples. SLP moved to 3-4 word phrases/sentences and pt success with WNL volume and clarity 90%. Pt to practice phrase level responses  until next session. Pt cancelled some appointments after last session to bring some weeks to x1/week, and pt asked SLP if she could get them back. SLP asked pt to check with support rep.   05/19/24: SLP began session with providing pt with CES. SLP began today with pt reading with more intent, purpose, and more deliberate. Average loudness was 69dB with SLP providing consistent mod-max A faded to occasional mod A for effort/intent. Specific cues not to be timid about speaking louder. SLP also used the phrase speak out. Pt to practice reading words with intent x20, BID.   05/12/24: SLP explained role in ST, need for pt to be more intentional and purposeful with speech, discussed pt's candidacy for skilled ST and based upon results today pt will be good candidate for improving speech loudness and clarity.  PATIENT EDUCATION: Education details: see treatment date this date, above Person educated: Patient Education method: Programmer, multimedia, Demonstration, and Verbal cues Education comprehension: verbalized understanding, returned demonstration, verbal cues required, and needs further education  HOME EXERCISE PROGRAM: Eventually intentional and deliberate speech   GOALS: Goals reviewed with patient? No  SHORT TERM GOALS: Target date:  07/07/24 due to visit number  At visit 9 pt will have improved PROM compared to initial measurement Baseline: Goal status: met  2.  Pt will improve loudness to average 70dB in simple conversation in 2 sessions Baseline:  Goal status: not met, cont in LTGs  3.  Pt will demo AB in 5 minutes at rest, 85% success in 2 sessions Baseline:  Goal status: partially met, cont in LTGs  4.  Pt will demo AB in sentence responses 85% success in 2 sessions Baseline: 06/15/24 Goal status: met   LONG TERM GOALS: Target date:  08/14/24  Pt will improve PROM compared to STG measurement Baseline:  Goal status: not met and continue  2.  Pt will demo AB in 5 minutes simple to  mod complex conversation, 85% success, in 3 sessions Baseline:  Goal status: not met and cont  3. Pt will average 70dB in 8 minutes conversation Baseline:  Goal status: INITIAL  ASSESSMENT:  CLINICAL IMPRESSION: RECERT TODAY. Pt has made gains  with conversational speech but has not had her full plan of care so all LTGs will be continued with one new LTG for WNL volume in conversation. Patient is a 78 y.o. F who was seen today for treatment of speech in light of PD. See treatment date above for today's date for further details on today's session. She continues improvement in ability to use more intent and in so doing, incr her speech clarity. Pt does not endorse s/sx dysphagia at this time.   OBJECTIVE IMPAIRMENTS: Objective impairments include dysarthria. These impairments are limiting patient from household responsibilities, ADLs/IADLs, and effectively communicating at home and in community.Factors affecting potential to achieve goals and functional outcome are none noted today.. Patient will benefit from skilled SLP services to address above impairments and improve overall function.  REHAB POTENTIAL: Good  PLAN:  SLP FREQUENCY: 1-2x/week  SLP DURATION: 17 total  sessions  PLANNED INTERVENTIONS: Environmental controls, Cueing hierachy, Internal/external aids, Oral motor exercises, Functional tasks, SLP instruction and feedback, Compensatory strategies, Patient/family education, and 07492 Treatment of speech (30 or 45 min)     Oliviah Agostini, CCC-SLP 07/16/2024, 8:26 AM

## 2024-07-16 ENCOUNTER — Encounter: Payer: Medicare Other | Admitting: Psychology

## 2024-07-17 ENCOUNTER — Ambulatory Visit

## 2024-07-17 ENCOUNTER — Encounter: Admitting: Psychology

## 2024-07-17 DIAGNOSIS — R471 Dysarthria and anarthria: Secondary | ICD-10-CM

## 2024-07-17 DIAGNOSIS — R4701 Aphasia: Secondary | ICD-10-CM

## 2024-07-17 NOTE — Therapy (Signed)
 OUTPATIENT SPEECH LANGUAGE PATHOLOGY [facility]   Patient Name: Andrea Burke MRN: [id number] DOB:[birth year], 78 y.o., female Today's Date: 07/17/2024  PCP: Allen Lauraine CROME, PA-C REFERRING PROVIDER: Evonnie Asberry RAMAN, DO  END OF SESSION:  End of Session - 07/17/24 1257     Visit Number 11    Number of Visits 17    Date for Recertification  08/14/24    SLP Start Time 0850    SLP Stop Time  0930    SLP Time Calculation (min) 40 min    Activity Tolerance Patient tolerated treatment well                Past Medical History:  Diagnosis Date   Cancer (HCC)    KIDNEY   Discoid lupus    Endometriosis    Headache(784.0)    Insomnia    Kidney disease    Lupus    Osteopenia    Urinary incontinence    Past Surgical History:  Procedure Laterality Date   CATARACT EXTRACTION, BILATERAL     PELVIC LAPAROSCOPY     DIAG LASER LAP-ENDOMETRIOSIS   REMOVAL OF LEFT KIDNEY  10/08/2006   Patient Active Problem List   Diagnosis Date Noted   Major depressive disorder, single episode, moderate (HCC) 08/06/2018   Moderate major depression (HCC) 06/23/2018   Anxiety disorder 06/23/2018   Pruritus 04/01/2017   Onychomycosis 05/12/2012   Lentigines 05/12/2012   Discoid lupus    Cancer (HCC)    Headache    Insomnia    Endometriosis    Osteopenia       ONSET DATE: script 04/21/24, diagnosed fall 2024  REFERRING DIAG:  R47.1 (ICD-10-CM) - Dysarthria    THERAPY DIAG:  Dysarthria and anarthria  Rationale for Evaluation and Treatment: Rehabilitation  SUBJECTIVE:   SUBJECTIVE STATEMENT: When SLP told pt that her voice sounded good she was unsure.   Pt accompanied by: self  PERTINENT HISTORY: See above   PAIN:  Are you having pain? No  FALLS: Has patient fallen in last 6 months?  Yes, Number of falls: one   PATIENT GOALS: improve speech ability  OBJECTIVE:  Note: Objective measures were completed at Evaluation unless otherwise  noted.  DIAGNOSTIC FINDINGS:  02/26/23 CLINICAL DATA:  Headache with increasing frequency or severity. EXAM: CT HEAD WITHOUT CONTRAST   TECHNIQUE: Contiguous axial images were obtained from the base of the skull through the vertex without intravenous contrast.   RADIATION DOSE REDUCTION: This exam was performed according to the departmental dose-optimization program which includes automated exposure control, adjustment of the mA and/or kV according to patient size and/or use of iterative reconstruction technique.   COMPARISON:  None Available.   FINDINGS: Brain: No evidence of acute infarction, hemorrhage, hydrocephalus, extra-axial collection or mass lesion/mass effect. Mild for age cerebral volume loss   Vascular: No hyperdense vessel or unexpected calcification.   Skull: Normal. Negative for fracture or focal lesion.   Sinuses/Orbits: No acute finding.   IMPRESSION: No acute or reversible finding.   PATIENT REPORTED OUTCOME MEASURES (PROM): Communication Effectiveness Survey: (CES) provided 05/19/24 and pt scored herself 16/32 with higher scores indicating more effective communication across situations. Pt scored herself 1 (not at all effective) with taking part in a conversation in a noisy environment.  In re-administration on 07/15/24 pt scored 18/32, with 3 (mostly effective) with having a conversation with family member at home, and having a conversation with a familiar person over the phone. All other responses were 2 (somewhat  effective).                                                                                                                            TREATMENT DATE:   07/17/24: Pt has done slightly more practicing since last session. SLP appreciates pt's results from her neuropsych evaluation. Nothing of note other than likely age-related cognitive changes.Today in multi-sentence responses pt's volume was WNL 90% of the time. SLP told pt to try to  practice more this week to ensure best possible opportunity for improvement.  07/15/24: Pt filled out PROM today - scored 18/32, greater than initial administration. Today pt maintained WNL volume with sentence and two-sentence responses, and was aware of lower than WNL responses demonstrated by self correcting 80% of the time. AB was noted in sentence responses 90%. Homework provided.  07/06/24: SLP used min A initially for pt to use intent and deliberate speech to improve volume and clarity of speech. Rufus benefited from rare min A for intent in 2-4 minute conversational segments x10. Pt was provided homework for multisentence responses using intent and purpose, lifting voice.   07/01/24: SLP assisted pt in speaking with more intent and purpose. In phrase level practice for warm up pt average volume was 71dB, and in sentence warm up pt's voice was average 71dB. In multiple sentence responses pt maintained intentional speech 85% of the time, with average 70dB. Homework to extend to today's session. In simple conversation pt's average was 70dB.  06/24/24: Pt admitted her practice was not 100% as she would have liked since last session. In phrase warm up pt used intent 90% of the time average 72dB. With sentneces from homework pt used intent 5/6, with average 70dB. Sentence responses with min-mod complex answers required average 70dB and intent noted 85% of the time; At times loudness decay heard. Homework provided. In simple conversation pt's volume decr'd to average 69dB with usual loudness decay.  06/22/24: Pt not adhering to practice schedule but is still making attempt to practice consistently. SLP encouraged Nickisha to cont to strive to practice as directed and provided two suggestions to accomplish this. Today with sentence responses pt used intentional purposeful speech 90% of the time, average 70dB. Short Q and A conversational responses following pt's initial response resulted in pt producing average  of 68dB. When asked to repeat or to use purposeful effortful speech pt's volume improved to 71dB average. Pt provided with homework.   06/15/24: No more falls since eval. Pt has been practicing at home. Word level responses were 95% with intent noted. Sentence level responses were noted with intent/purpose/mindfulness 88% of the time - loudness 71dB average. Pt self corrected once, and improved her production to 100% when asked by SLP to repeat due to not using purpose with her answer. Pt stated she felt like she was shouting at someone so she didn't want to use intent. SLP used digital recorder x2 to use auditory feedback to demo to pt  she was not shouting. She agreed with SLP that her speech was WNL volume. SLP showed her using diagram why she feels like she is shouting. Pt to cont to practice at sentence level.   06/12/24: To warm up SLP gave pt sentences to read with intent. Pt with 80% success. SLP targeted phrases and sentences with pt using intent and purpose in her speech. Pt demonstrated incr'd mindfulness with responses 75% of the time, improving articulation and making volume more consistently WNL range. SLP with explanation that pt's changing a habit to being more intentional and purpseful with her speech will benefit later as pt stated, If I do this why does it help? It's no different for the person I am talking to.   06/03/24: SLP targeted word level intent/purposeful speech with near 100% success. Pt  notably had difficulty with [employer]. Seemed concerned about this. SLP told pt to write down words she feels she gets tied up on and practice those  words if they are common words; SLP provided examples. SLP moved to 3-4 word phrases/sentences and pt success with WNL volume and clarity 90%. Pt to practice phrase level responses until next session. Pt cancelled some appointments after last session to bring some weeks to x1/week, and pt asked SLP if she could get them back. SLP asked pt to check  with support rep.   05/19/24: SLP began session with providing pt with CES. SLP began today with pt reading with more intent, purpose, and more deliberate. Average loudness was 69dB with SLP providing consistent mod-max A faded to occasional mod A for effort/intent. Specific cues not to be timid about speaking louder. SLP also used the phrase speak out. Pt to practice reading words with intent x20, BID.   05/12/24: SLP explained role in ST, need for pt to be more intentional and purposeful with speech, discussed pt's candidacy for skilled ST and based upon results today pt will be good candidate for improving speech loudness and clarity.  PATIENT EDUCATION: Education details: see treatment date this date, above Person educated: Patient Education method: Programmer, multimedia, Demonstration, and Verbal cues Education comprehension: verbalized understanding, returned demonstration, verbal cues required, and needs further education  HOME EXERCISE PROGRAM: Eventually intentional and deliberate speech   GOALS: Goals reviewed with patient? No  SHORT TERM GOALS: Target date:  07/07/24 due to visit number  At visit 9 pt will have improved PROM compared to initial measurement Baseline: Goal status: met  2.  Pt will improve loudness to average 70dB in simple conversation in 2 sessions Baseline:  Goal status: not met, cont in LTGs  3.  Pt will demo AB in 5 minutes at rest, 85% success in 2 sessions Baseline:  Goal status: partially met, cont in LTGs  4.  Pt will demo AB in sentence responses 85% success in 2 sessions Baseline: 06/15/24 Goal status: met   LONG TERM GOALS: Target date:  08/14/24  Pt will improve PROM compared to STG measurement Baseline:  Goal status: not met and continue  2.  Pt will demo AB in 5 minutes simple to mod complex conversation, 85% success, in 3 sessions Baseline:  Goal status: not met and cont  3. Pt will average 70dB in 8 minutes conversation Baseline:  Goal  status: INITIAL  ASSESSMENT:  CLINICAL IMPRESSION: Patient is a 78 y.o. F who was seen today for treatment of speech in light of PD. See treatment date above for today's date for further details on today's session. She continues improvement  in ability to use more intent and in so doing, incr her speech clarity. Pt does not endorse s/sx dysphagia at this time.   OBJECTIVE IMPAIRMENTS: Objective impairments include dysarthria. These impairments are limiting patient from household responsibilities, ADLs/IADLs, and effectively communicating at home and in community.Factors affecting potential to achieve goals and functional outcome are none noted today.. Patient will benefit from skilled SLP services to address above impairments and improve overall function.  REHAB POTENTIAL: Good  PLAN:  SLP FREQUENCY: 1-2x/week  SLP DURATION: 17 total  sessions  PLANNED INTERVENTIONS: Environmental controls, Cueing hierachy, Internal/external aids, Oral motor exercises, Functional tasks, SLP instruction and feedback, Compensatory strategies, Patient/family education, and 07492 Treatment of speech (30 or 45 min)     Menno Vanbergen, CCC-SLP 07/17/2024, 12:59 PM

## 2024-07-20 ENCOUNTER — Institutional Professional Consult (permissible substitution): Admitting: Psychology

## 2024-07-20 ENCOUNTER — Ambulatory Visit: Payer: Self-pay

## 2024-07-22 ENCOUNTER — Ambulatory Visit: Admitting: Physician Assistant

## 2024-07-22 ENCOUNTER — Encounter

## 2024-07-23 ENCOUNTER — Ambulatory Visit (INDEPENDENT_AMBULATORY_CARE_PROVIDER_SITE_OTHER): Admitting: Physician Assistant

## 2024-07-23 ENCOUNTER — Encounter: Payer: Self-pay | Admitting: Physician Assistant

## 2024-07-23 DIAGNOSIS — F330 Major depressive disorder, recurrent, mild: Secondary | ICD-10-CM

## 2024-07-23 DIAGNOSIS — F4321 Adjustment disorder with depressed mood: Secondary | ICD-10-CM | POA: Diagnosis not present

## 2024-07-23 DIAGNOSIS — R5381 Other malaise: Secondary | ICD-10-CM

## 2024-07-23 MED ORDER — AMPHETAMINE-DEXTROAMPHETAMINE 5 MG PO TABS: 5.0000 mg | ORAL_TABLET | Freq: Every morning | ORAL | 0 refills | Status: DC

## 2024-07-23 NOTE — Patient Instructions (Addendum)
 Increase the Adderall 5 mg to 1.5 pills every morning. Please call the office sometime the week of November 3 to let me know how well you are responding to this new dose.  If you feel like it is working well enough I will send in another 30-day supply.  If it is not strong enough leave that message as well and I will increase the dose up to 10 mg.

## 2024-07-23 NOTE — Progress Notes (Signed)
 [facility]  Patient ID: Andrea Burke,  MRN: [id number]  PCP: Allen Lauraine CROME, PA-C  Date of Evaluation: 07/23/2024 Time spent:20 minutes  Chief Complaint:  Chief Complaint   Depression; Follow-up    HISTORY/CURRENT STATUS: HPI for routine follow-up.  Her mom passed away 3 weeks ago, understandably sad. Stress with her husband's ongoing health issues too. Is going through a lot.  Energy is ok.  The Adderall is helping a little.  Sleeps well most of the time but still naps during the day.  Not as bad as before starting Adderall.  ADLs and personal hygiene are normal.   Appetite has not changed.  No mania, delirium, AH/VH.  No SI/HI.  Individual Medical History/ Review of Systems: Changes? :Yes   see neuro-cognitive eval last month.   Past medication trials: Remeron- vivid dreams, increased appetite Lexapro-nightmares Sertraline-agitation, insomnia, impaired concentration Paxil-intolerable side effects Fluoxetine-intolerable side effects Viibryd - Partial improvement on 20 mg. Does not want to take doses above 20 mg. Cymbalta  at doses higher than 30 mg causes nightmares Wellbutrin- Severe tremors Lithium  Hydroxyzine- has taken for itching.  Xanax Deplin Gabapentin- prescribed for pain  Allergies: Doxycycline calcium and Penicillins  Current Medications:  Current Outpatient Medications:    carbidopa -levodopa  (SINEMET  IR) 25-100 MG tablet, Take 1 tablet by mouth 3 (three) times daily., Disp: 270 tablet, Rfl: 1   clobetasol cream (TEMOVATE) 0.05 %, Apply topically., Disp: , Rfl:    Clobetasol Propionate (TEMOVATE) 0.05 % external spray, Apply to scalp twice daily x 2 wk, then daily x 2 wk, then every other day x 2 wk. Not to face., Disp: , Rfl:    DULoxetine  (CYMBALTA ) 30 MG capsule, Take 1 capsule (30 mg total) by mouth daily., Disp: 90 capsule, Rfl: 1   finasteride (PROSCAR) 5 MG tablet, Take 5 mg by mouth daily., Disp: , Rfl:    lidocaine  (LIDODERM ) 5 %,  Place 1 patch onto the skin daily. Remove & Discard patch within 12 hours or as directed by MD, Disp: 30 patch, Rfl: 0   amphetamine -dextroamphetamine  (ADDERALL) 5 MG tablet, Take 1-1.5 tablets (5-7.5 mg total) by mouth every morning., Disp: 45 tablet, Rfl: 0   hydroxychloroquine (PLAQUENIL) 200 MG tablet, Take by mouth daily. (Patient not taking: Reported on 05/12/2024), Disp: , Rfl:  Medication Side Effects: none  Family Medical/ Social History: Changes?  Her Mom died 3 weeks ago, had a stroke. Husband will have knee surgery in a month or so.   MENTAL HEALTH EXAM:  There were no vitals taken for this visit.There is no height or weight on file to calculate BMI.  General Appearance: Casual and Well Groomed  Eye Contact:  Good  Speech:  Clear and Coherent and Normal Rate  Volume:  Normal  Mood:  Sad  Affect:  sad and tearful when talking about her Mom  Thought Process:  Goal Directed and Descriptions of Associations: Circumstantial  Orientation:  Full (Time, Place, and Person)  Thought Content: Logical   Suicidal Thoughts:  No  Homicidal Thoughts:  No  Memory:  WNL  Judgement:  Good  Insight:  Good  Psychomotor Activity:  Normal  Concentration:  Concentration: Fair and Attention Span: Good  Recall:  Good  Fund of Knowledge: Good  Language: Good  Assets:  Desire for Improvement Financial Resources/Insurance Housing Transportation Vocational/Educational  ADL's:  Intact  Cognition: WNL  Prognosis:  Good   DIAGNOSES:    ICD-10-CM   1. Mild episode of recurrent major depressive disorder  F33.0     2. Malaise and fatigue  R53.81    R53.83     3. Grief  F43.21      Receiving Psychotherapy: Yes with Marval Bunde, LCSW.  RECOMMENDATIONS:   PDMP Reviewed.  Adderall filled 06/15/2024. I provided approximately  20 minutes of face to face time during this encounter, including time spent before and after the visit in records review, medical decision making, counseling pertinent to  today's visit, and charting.   Increase the Adderall 5 mg to 1.5 pills every morning. Please call the office sometime the week of November 3 to let me know how well you are responding to this new dose.  If you feel like it is working well enough I will send in another 30-day supply.  If it is not strong enough leave that message as well and I will increase the dose up to 10 mg.  My sympathy in the loss of her Mom.   Increase Adderall 5 mg, to 1.5 pills daily. Continue Cymbalta  30 mg, 1 p.o. q evening.   Continue therapy with Marval Bunde, LCSW. Return in 2 months.   Verneita Cooks, PA-C

## 2024-07-24 ENCOUNTER — Ambulatory Visit

## 2024-07-24 DIAGNOSIS — R471 Dysarthria and anarthria: Secondary | ICD-10-CM

## 2024-07-24 DIAGNOSIS — R4701 Aphasia: Secondary | ICD-10-CM

## 2024-07-24 NOTE — Therapy (Signed)
 OUTPATIENT SPEECH LANGUAGE PATHOLOGY [facility]   Patient Name: Andrea Burke MRN: [id number] DOB:December 06, 1945, 78 y.o., female Today's Date: 07/24/2024  PCP: Allen Lauraine CROME, PA-C REFERRING PROVIDER: Evonnie Asberry RAMAN, DO  END OF SESSION:  End of Session - 07/24/24 0857     Visit Number 12    Number of Visits 17    Date for Recertification  08/14/24    SLP Start Time 0853    SLP Stop Time  0932    SLP Time Calculation (min) 39 min    Activity Tolerance Patient tolerated treatment well                Past Medical History:  Diagnosis Date   Cancer (HCC)    KIDNEY   Discoid lupus    Endometriosis    Headache(784.0)    Insomnia    Kidney disease    Lupus    Osteopenia    Urinary incontinence    Past Surgical History:  Procedure Laterality Date   CATARACT EXTRACTION, BILATERAL     PELVIC LAPAROSCOPY     DIAG LASER LAP-ENDOMETRIOSIS   REMOVAL OF LEFT KIDNEY  10/08/2006   Patient Active Problem List   Diagnosis Date Noted   Major depressive disorder, single episode, moderate (HCC) 08/06/2018   Moderate major depression (HCC) 06/23/2018   Anxiety disorder 06/23/2018   Pruritus 04/01/2017   Onychomycosis 05/12/2012   Lentigines 05/12/2012   Discoid lupus    Cancer (HCC)    Headache    Insomnia    Endometriosis    Osteopenia       ONSET DATE: script 04/21/24, diagnosed fall 2024  REFERRING DIAG:  R47.1 (ICD-10-CM) - Dysarthria    THERAPY DIAG:  Dysarthria and anarthria  Rationale for Evaluation and Treatment: Rehabilitation  SUBJECTIVE:   SUBJECTIVE STATEMENT: Now I see why you are telling me to throw my voice.  Pt accompanied by: self  PERTINENT HISTORY: See above   PAIN:  Are you having pain? No  FALLS: Has patient fallen in last 6 months?  Yes, Number of falls: one   PATIENT GOALS: improve speech ability  OBJECTIVE:  Note: Objective measures were completed at Evaluation unless otherwise noted.  DIAGNOSTIC  FINDINGS:  02/26/23 CLINICAL DATA:  Headache with increasing frequency or severity. EXAM: CT HEAD WITHOUT CONTRAST   TECHNIQUE: Contiguous axial images were obtained from the base of the skull through the vertex without intravenous contrast.   RADIATION DOSE REDUCTION: This exam was performed according to the departmental dose-optimization program which includes automated exposure control, adjustment of the mA and/or kV according to patient size and/or use of iterative reconstruction technique.   COMPARISON:  None Available.   FINDINGS: Brain: No evidence of acute infarction, hemorrhage, hydrocephalus, extra-axial collection or mass lesion/mass effect. Mild for age cerebral volume loss   Vascular: No hyperdense vessel or unexpected calcification.   Skull: Normal. Negative for fracture or focal lesion.   Sinuses/Orbits: No acute finding.   IMPRESSION: No acute or reversible finding.   PATIENT REPORTED OUTCOME MEASURES (PROM): Communication Effectiveness Survey: (CES) provided 05/19/24 and pt scored herself 16/32 with higher scores indicating more effective communication across situations. Pt scored herself 1 (not at all effective) with taking part in a conversation in a noisy environment.  In re-administration on 07/15/24 pt scored 18/32, with 3 (mostly effective) with having a conversation with family member at home, and having a conversation with a familiar person over the phone. All other responses were 2 (somewhat effective).  TREATMENT DATE:   07/24/24: Pt states she had little stumbles with her speech prior to coming to ST. Pt had questions about decline of speech and SLP shared with pt that right now she's doing everything possible to afford success with her speech. SLP guided pt in short conversational segments of 45-90 seconds pt maintained  average WNL volume 80% of the time. After 7 of these pt stated, I need to continue to use more effort when I talk. At the end of the session she stated s statement.  07/17/24: Pt has done slightly more practicing since last session. SLP appreciates pt's results from her neuropsych evaluation. Nothing of note other than likely age-related cognitive changes.Today in multi-sentence responses pt's volume was WNL 90% of the time. SLP told pt to try to practice more this week to ensure best possible opportunity for improvement.  07/15/24: Pt filled out PROM today - scored 18/32, greater than initial administration. Today pt maintained WNL volume with sentence and two-sentence responses, and was aware of lower than WNL responses demonstrated by self correcting 80% of the time. AB was noted in sentence responses 90%. Homework provided.  07/06/24: SLP used min A initially for pt to use intent and deliberate speech to improve volume and clarity of speech. Brieonna benefited from rare min A for intent in 2-4 minute conversational segments x10. Pt was provided homework for multisentence responses using intent and purpose, lifting voice.   07/01/24: SLP assisted pt in speaking with more intent and purpose. In phrase level practice for warm up pt average volume was 71dB, and in sentence warm up pt's voice was average 71dB. In multiple sentence responses pt maintained intentional speech 85% of the time, with average 70dB. Homework to extend to today's session. In simple conversation pt's average was 70dB.  06/24/24: Pt admitted her practice was not 100% as she would have liked since last session. In phrase warm up pt used intent 90% of the time average 72dB. With sentneces from homework pt used intent 5/6, with average 70dB. Sentence responses with min-mod complex answers required average 70dB and intent noted 85% of the time; At times loudness decay heard. Homework provided. In simple conversation pt's volume decr'd to  average 69dB with usual loudness decay.  06/22/24: Pt not adhering to practice schedule but is still making attempt to practice consistently. SLP encouraged Kourtlyn to cont to strive to practice as directed and provided two suggestions to accomplish this. Today with sentence responses pt used intentional purposeful speech 90% of the time, average 70dB. Short Q and A conversational responses following pt's initial response resulted in pt producing average of 68dB. When asked to repeat or to use purposeful effortful speech pt's volume improved to 71dB average. Pt provided with homework.   06/15/24: No more falls since eval. Pt has been practicing at home. Word level responses were 95% with intent noted. Sentence level responses were noted with intent/purpose/mindfulness 88% of the time - loudness 71dB average. Pt self corrected once, and improved her production to 100% when asked by SLP to repeat due to not using purpose with her answer. Pt stated she felt like she was shouting at someone so she didn't want to use intent. SLP used digital recorder x2 to use auditory feedback to demo to pt she was not shouting. She agreed with SLP that her speech was WNL volume. SLP showed her using diagram why she feels like she is shouting. Pt to cont to practice at sentence level.   06/12/24:  To warm up SLP gave pt sentences to read with intent. Pt with 80% success. SLP targeted phrases and sentences with pt using intent and purpose in her speech. Pt demonstrated incr'd mindfulness with responses 75% of the time, improving articulation and making volume more consistently WNL range. SLP with explanation that pt's changing a habit to being more intentional and purpseful with her speech will benefit later as pt stated, If I do this why does it help? It's no different for the person I am talking to.   06/03/24: SLP targeted word level intent/purposeful speech with near 100% success. Pt  notably had difficulty with [employer].  Seemed concerned about this. SLP told pt to write down words she feels she gets tied up on and practice those  words if they are common words; SLP provided examples. SLP moved to 3-4 word phrases/sentences and pt success with WNL volume and clarity 90%. Pt to practice phrase level responses until next session. Pt cancelled some appointments after last session to bring some weeks to x1/week, and pt asked SLP if she could get them back. SLP asked pt to check with support rep.   05/19/24: SLP began session with providing pt with CES. SLP began today with pt reading with more intent, purpose, and more deliberate. Average loudness was 69dB with SLP providing consistent mod-max A faded to occasional mod A for effort/intent. Specific cues not to be timid about speaking louder. SLP also used the phrase speak out. Pt to practice reading words with intent x20, BID.   05/12/24: SLP explained role in ST, need for pt to be more intentional and purposeful with speech, discussed pt's candidacy for skilled ST and based upon results today pt will be good candidate for improving speech loudness and clarity.  PATIENT EDUCATION: Education details: see treatment date this date, above Person educated: Patient Education method: Programmer, multimedia, Demonstration, and Verbal cues Education comprehension: verbalized understanding, returned demonstration, verbal cues required, and needs further education  HOME EXERCISE PROGRAM: Eventually intentional and deliberate speech   GOALS: Goals reviewed with patient? No  SHORT TERM GOALS: Target date:  07/07/24 due to visit number  At visit 9 pt will have improved PROM compared to initial measurement Baseline: Goal status: met  2.  Pt will improve loudness to average 70dB in simple conversation in 2 sessions Baseline:  Goal status: not met, cont in LTGs  3.  Pt will demo AB in 5 minutes at rest, 85% success in 2 sessions Baseline:  Goal status: partially met, cont in  LTGs  4.  Pt will demo AB in sentence responses 85% success in 2 sessions Baseline: 06/15/24 Goal status: met   LONG TERM GOALS: Target date:  08/14/24  Pt will improve PROM compared to STG measurement Baseline:  Goal status: not met and continue  2.  Pt will demo AB in 5 minutes simple to mod complex conversation, 85% success, in 3 sessions Baseline:  Goal status: not met and cont  3. Pt will average 70dB in 8 minutes conversation Baseline:  Goal status: INITIAL  ASSESSMENT:  CLINICAL IMPRESSION: Patient is a 78 y.o. F who was seen today for treatment of speech in light of PD. See treatment date above for today's date for further details on today's session. She continues improvement in ability to use more intent and in so doing, incr her speech clarity. She is becoming more aware of the need to use more intent when she speaks. Pt does not endorse s/sx dysphagia at this  time.   OBJECTIVE IMPAIRMENTS: Objective impairments include dysarthria. These impairments are limiting patient from household responsibilities, ADLs/IADLs, and effectively communicating at home and in community.Factors affecting potential to achieve goals and functional outcome are none noted today.. Patient will benefit from skilled SLP services to address above impairments and improve overall function.  REHAB POTENTIAL: Good  PLAN:  SLP FREQUENCY: 1-2x/week  SLP DURATION: 17 total  sessions  PLANNED INTERVENTIONS: Environmental controls, Cueing hierachy, Internal/external aids, Oral motor exercises, Functional tasks, SLP instruction and feedback, Compensatory strategies, Patient/family education, and [id number] Treatment of speech (30 or 45 min)     Kemarion Abbey, CCC-SLP 07/24/2024, 8:57 AM

## 2024-07-26 ENCOUNTER — Encounter: Payer: Self-pay | Admitting: Physician Assistant

## 2024-07-28 ENCOUNTER — Ambulatory Visit

## 2024-07-28 DIAGNOSIS — R471 Dysarthria and anarthria: Secondary | ICD-10-CM

## 2024-07-28 DIAGNOSIS — R4701 Aphasia: Secondary | ICD-10-CM | POA: Diagnosis not present

## 2024-07-28 NOTE — Therapy (Signed)
 OUTPATIENT SPEECH LANGUAGE PATHOLOGY [facility]   Patient Name: Andrea Burke MRN: [id number] DOB:[birth year], 78 y.o., female Today's Date: 07/28/2024  PCP: Allen Lauraine CROME, PA-C REFERRING PROVIDER: Evonnie Asberry RAMAN, DO  END OF SESSION:  End of Session - 07/28/24 1448     Visit Number 13    Number of Visits 17    Date for Recertification  08/14/24    SLP Start Time 1448    SLP Stop Time  1530    SLP Time Calculation (min) 42 min    Activity Tolerance Patient tolerated treatment well                Past Medical History:  Diagnosis Date   Cancer (HCC)    KIDNEY   Discoid lupus    Endometriosis    Headache(784.0)    Insomnia    Kidney disease    Lupus    Osteopenia    Urinary incontinence    Past Surgical History:  Procedure Laterality Date   CATARACT EXTRACTION, BILATERAL     PELVIC LAPAROSCOPY     DIAG LASER LAP-ENDOMETRIOSIS   REMOVAL OF LEFT KIDNEY  10/08/2006   Patient Active Problem List   Diagnosis Date Noted   Major depressive disorder, single episode, moderate (HCC) 08/06/2018   Moderate major depression (HCC) 06/23/2018   Anxiety disorder 06/23/2018   Pruritus 04/01/2017   Onychomycosis 05/12/2012   Lentigines 05/12/2012   Discoid lupus    Cancer (HCC)    Headache    Insomnia    Endometriosis    Osteopenia       ONSET DATE: script 04/21/24, diagnosed fall 2024  REFERRING DIAG:  R47.1 (ICD-10-CM) - Dysarthria    THERAPY DIAG:  Dysarthria and anarthria  Rationale for Evaluation and Treatment: Rehabilitation  SUBJECTIVE:   SUBJECTIVE STATEMENT: How did your team do this weekend?  Pt accompanied by: self  PERTINENT HISTORY: See above   PAIN:  Are you having pain? No  FALLS: Has patient fallen in last 6 months?  Yes, Number of falls: one   PATIENT GOALS: improve speech ability  OBJECTIVE:  Note: Objective measures were completed at Evaluation unless otherwise noted.  DIAGNOSTIC FINDINGS:   02/26/23 CLINICAL DATA:  Headache with increasing frequency or severity. EXAM: CT HEAD WITHOUT CONTRAST   TECHNIQUE: Contiguous axial images were obtained from the base of the skull through the vertex without intravenous contrast.   RADIATION DOSE REDUCTION: This exam was performed according to the departmental dose-optimization program which includes automated exposure control, adjustment of the mA and/or kV according to patient size and/or use of iterative reconstruction technique.   COMPARISON:  None Available.   FINDINGS: Brain: No evidence of acute infarction, hemorrhage, hydrocephalus, extra-axial collection or mass lesion/mass effect. Mild for age cerebral volume loss   Vascular: No hyperdense vessel or unexpected calcification.   Skull: Normal. Negative for fracture or focal lesion.   Sinuses/Orbits: No acute finding.   IMPRESSION: No acute or reversible finding.   PATIENT REPORTED OUTCOME MEASURES (PROM): Communication Effectiveness Survey: (CES) provided 05/19/24 and pt scored herself 16/32 with higher scores indicating more effective communication across situations. Pt scored herself 1 (not at all effective) with taking part in a conversation in a noisy environment.  In re-administration on 07/15/24 pt scored 18/32, with 3 (mostly effective) with having a conversation with family member at home, and having a conversation with a familiar person over the phone. All other responses were 2 (somewhat effective).  TREATMENT DATE:   07/28/24: 3-4 minute conversational segments were measured today for WNL volume. With occasional min cues pt maintained WNL volume x5. SLP took pt outside to use more generalized surroundings and pt maintained intelligible volume in this setting also, for 7 minutes. SLP and pt agreed she could decr to once/week.     07/24/24: Pt states she had little stumbles with her speech prior to coming to ST. Pt had questions about decline of speech and SLP shared with pt that right now she's doing everything possible to afford success with her speech. SLP guided pt in short conversational segments of 45-90 seconds pt maintained average WNL volume 80% of the time. After 7 of these pt stated, I need to continue to use more effort when I talk. At the end of the session she stated s statement.  07/17/24: Pt has done slightly more practicing since last session. SLP appreciates pt's results from her neuropsych evaluation. Nothing of note other than likely age-related cognitive changes.Today in multi-sentence responses pt's volume was WNL 90% of the time. SLP told pt to try to practice more this week to ensure best possible opportunity for improvement.  07/15/24: Pt filled out PROM today - scored 18/32, greater than initial administration. Today pt maintained WNL volume with sentence and two-sentence responses, and was aware of lower than WNL responses demonstrated by self correcting 80% of the time. AB was noted in sentence responses 90%. Homework provided.  07/06/24: SLP used min A initially for pt to use intent and deliberate speech to improve volume and clarity of speech. Jasia benefited from rare min A for intent in 2-4 minute conversational segments x10. Pt was provided homework for multisentence responses using intent and purpose, lifting voice.   07/01/24: SLP assisted pt in speaking with more intent and purpose. In phrase level practice for warm up pt average volume was 71dB, and in sentence warm up pt's voice was average 71dB. In multiple sentence responses pt maintained intentional speech 85% of the time, with average 70dB. Homework to extend to today's session. In simple conversation pt's average was 70dB.  06/24/24: Pt admitted her practice was not 100% as she would have liked since last session. In phrase warm up  pt used intent 90% of the time average 72dB. With sentneces from homework pt used intent 5/6, with average 70dB. Sentence responses with min-mod complex answers required average 70dB and intent noted 85% of the time; At times loudness decay heard. Homework provided. In simple conversation pt's volume decr'd to average 69dB with usual loudness decay.  06/22/24: Pt not adhering to practice schedule but is still making attempt to practice consistently. SLP encouraged Shariece to cont to strive to practice as directed and provided two suggestions to accomplish this. Today with sentence responses pt used intentional purposeful speech 90% of the time, average 70dB. Short Q and A conversational responses following pt's initial response resulted in pt producing average of 68dB. When asked to repeat or to use purposeful effortful speech pt's volume improved to 71dB average. Pt provided with homework.   06/15/24: No more falls since eval. Pt has been practicing at home. Word level responses were 95% with intent noted. Sentence level responses were noted with intent/purpose/mindfulness 88% of the time - loudness 71dB average. Pt self corrected once, and improved her production to 100% when asked by SLP to repeat due to not using purpose with her answer. Pt stated she felt like she was shouting at someone so she didn't want  to use intent. SLP used digital recorder x2 to use auditory feedback to demo to pt she was not shouting. She agreed with SLP that her speech was WNL volume. SLP showed her using diagram why she feels like she is shouting. Pt to cont to practice at sentence level.   06/12/24: To warm up SLP gave pt sentences to read with intent. Pt with 80% success. SLP targeted phrases and sentences with pt using intent and purpose in her speech. Pt demonstrated incr'd mindfulness with responses 75% of the time, improving articulation and making volume more consistently WNL range. SLP with explanation that pt's changing a  habit to being more intentional and purpseful with her speech will benefit later as pt stated, If I do this why does it help? It's no different for the person I am talking to.   06/03/24: SLP targeted word level intent/purposeful speech with near 100% success. Pt  notably had difficulty with [employer]. Seemed concerned about this. SLP told pt to write down words she feels she gets tied up on and practice those  words if they are common words; SLP provided examples. SLP moved to 3-4 word phrases/sentences and pt success with WNL volume and clarity 90%. Pt to practice phrase level responses until next session. Pt cancelled some appointments after last session to bring some weeks to x1/week, and pt asked SLP if she could get them back. SLP asked pt to check with support rep.   05/19/24: SLP began session with providing pt with CES. SLP began today with pt reading with more intent, purpose, and more deliberate. Average loudness was 69dB with SLP providing consistent mod-max A faded to occasional mod A for effort/intent. Specific cues not to be timid about speaking louder. SLP also used the phrase speak out. Pt to practice reading words with intent x20, BID.   05/12/24: SLP explained role in ST, need for pt to be more intentional and purposeful with speech, discussed pt's candidacy for skilled ST and based upon results today pt will be good candidate for improving speech loudness and clarity.  PATIENT EDUCATION: Education details: see treatment date this date, above Person educated: Patient Education method: Programmer, multimedia, Demonstration, and Verbal cues Education comprehension: verbalized understanding, returned demonstration, verbal cues required, and needs further education  HOME EXERCISE PROGRAM: Eventually intentional and deliberate speech   GOALS: Goals reviewed with patient? No  SHORT TERM GOALS: Target date:  07/07/24 due to visit number  At visit 9 pt will have improved PROM compared to  initial measurement Baseline: Goal status: met  2.  Pt will improve loudness to average 70dB in simple conversation in 2 sessions Baseline:  Goal status: not met, cont in LTGs  3.  Pt will demo AB in 5 minutes at rest, 85% success in 2 sessions Baseline:  Goal status: partially met, cont in LTGs  4.  Pt will demo AB in sentence responses 85% success in 2 sessions Baseline: 06/15/24 Goal status: met   LONG TERM GOALS: Target date:  08/14/24  Pt will improve PROM compared to STG measurement Baseline:  Goal status: continue  2.  Pt will demo AB in 5 minutes simple to mod complex conversation, 85% success, in 3 sessions Baseline: 07/28/24 Goal status: cont  3. Pt will average 70dB in 8 minutes conversation Baseline:  Goal status: INITIAL  ASSESSMENT:  CLINICAL IMPRESSION: Patient is a 78 y.o. F who was seen today for treatment of speech in light of PD. See treatment date above for today's  date for further details on today's session. She continues to demo improvement in ability to use more intent and in so doing, incr her speech clarity and maintain WNL volume in conversation. Pt does not endorse s/sx dysphagia at this time.   OBJECTIVE IMPAIRMENTS: Objective impairments include dysarthria. These impairments are limiting patient from household responsibilities, ADLs/IADLs, and effectively communicating at home and in community.Factors affecting potential to achieve goals and functional outcome are none noted today.. Patient will benefit from skilled SLP services to address above impairments and improve overall function.  REHAB POTENTIAL: Good  PLAN:  SLP FREQUENCY: 1-2x/week  SLP DURATION: 17 total  sessions  PLANNED INTERVENTIONS: Environmental controls, Cueing hierachy, Internal/external aids, Oral motor exercises, Functional tasks, SLP instruction and feedback, Compensatory strategies, Patient/family education, and 07492 Treatment of speech (30 or 45 min)      Xavien Dauphinais, CCC-SLP 07/28/2024, 2:49 PM

## 2024-07-30 ENCOUNTER — Ambulatory Visit

## 2024-08-03 ENCOUNTER — Ambulatory Visit

## 2024-08-03 DIAGNOSIS — R471 Dysarthria and anarthria: Secondary | ICD-10-CM | POA: Diagnosis not present

## 2024-08-03 DIAGNOSIS — R4701 Aphasia: Secondary | ICD-10-CM | POA: Diagnosis not present

## 2024-08-03 NOTE — Therapy (Signed)
 OUTPATIENT SPEECH LANGUAGE PATHOLOGY [facility]   Patient Name: Andrea Burke MRN: [id number] DOB:[birth year], 78 y.o., female Today's Date: 08/03/2024  PCP: Allen Lauraine CROME, PA-C REFERRING PROVIDER: Evonnie Asberry RAMAN, DO  END OF SESSION:  End of Session - 08/03/24 1357     Visit Number 14    Number of Visits 17    Date for Recertification  08/14/24    SLP Start Time 1019    SLP Stop Time  1100    SLP Time Calculation (min) 41 min    Activity Tolerance Patient tolerated treatment well                 Past Medical History:  Diagnosis Date   Cancer (HCC)    KIDNEY   Discoid lupus    Endometriosis    Headache(784.0)    Insomnia    Kidney disease    Lupus    Osteopenia    Urinary incontinence    Past Surgical History:  Procedure Laterality Date   CATARACT EXTRACTION, BILATERAL     PELVIC LAPAROSCOPY     DIAG LASER LAP-ENDOMETRIOSIS   REMOVAL OF LEFT KIDNEY  10/08/2006   Patient Active Problem List   Diagnosis Date Noted   Major depressive disorder, single episode, moderate (HCC) 08/06/2018   Moderate major depression (HCC) 06/23/2018   Anxiety disorder 06/23/2018   Pruritus 04/01/2017   Onychomycosis 05/12/2012   Lentigines 05/12/2012   Discoid lupus    Cancer (HCC)    Headache    Insomnia    Endometriosis    Osteopenia       ONSET DATE: script 04/21/24, diagnosed fall 2024  REFERRING DIAG:  R47.1 (ICD-10-CM) - Dysarthria    THERAPY DIAG:  Dysarthria and anarthria  Rationale for Evaluation and Treatment: Rehabilitation  SUBJECTIVE:   SUBJECTIVE STATEMENT: Pt is not speaking much at home.   Pt accompanied by: self  PERTINENT HISTORY: See above   PAIN:  Are you having pain? Yes - mid-low back 5/10.   FALLS: Has patient fallen in last 6 months?  Yes, Number of falls: one   PATIENT GOALS: improve speech ability  OBJECTIVE:  Note: Objective measures were completed at Evaluation unless otherwise noted.  DIAGNOSTIC  FINDINGS:  02/26/23 CLINICAL DATA:  Headache with increasing frequency or severity. EXAM: CT HEAD WITHOUT CONTRAST   TECHNIQUE: Contiguous axial images were obtained from the base of the skull through the vertex without intravenous contrast.   RADIATION DOSE REDUCTION: This exam was performed according to the departmental dose-optimization program which includes automated exposure control, adjustment of the mA and/or kV according to patient size and/or use of iterative reconstruction technique.   COMPARISON:  None Available.   FINDINGS: Brain: No evidence of acute infarction, hemorrhage, hydrocephalus, extra-axial collection or mass lesion/mass effect. Mild for age cerebral volume loss   Vascular: No hyperdense vessel or unexpected calcification.   Skull: Normal. Negative for fracture or focal lesion.   Sinuses/Orbits: No acute finding.   IMPRESSION: No acute or reversible finding.   PATIENT REPORTED OUTCOME MEASURES (PROM): Communication Effectiveness Survey: (CES) provided 05/19/24 and pt scored herself 16/32 with higher scores indicating more effective communication across situations. Pt scored herself 1 (not at all effective) with taking part in a conversation in a noisy environment.  In re-administration on 07/15/24 pt scored 18/32, with 3 (mostly effective) with having a conversation with family member at home, and having a conversation with a familiar person over the phone. All other responses were 2 (  somewhat effective).                                                                                                                            TREATMENT DATE:   08/03/24: SLP guided pt through conversational segments of 2-4 minutes. Pt maintained WNL volume 80% of the time with these. Because of pt's s, SLP reiterated pt will need to practice speech after d/c and helped pt problem solve which people she could talk with after d/c.   07/28/24: 3-4 minute  conversational segments were measured today for WNL volume. With occasional min cues pt maintained WNL volume x5. SLP took pt outside to use more generalized surroundings and pt maintained intelligible volume in this setting also, for 7 minutes. SLP and pt agreed she could decr to once/week.    07/24/24: Pt states she had little stumbles with her speech prior to coming to ST. Pt had questions about decline of speech and SLP shared with pt that right now she's doing everything possible to afford success with her speech. SLP guided pt in short conversational segments of 45-90 seconds pt maintained average WNL volume 80% of the time. After 7 of these pt stated, I need to continue to use more effort when I talk. At the end of the session she stated s statement.  07/17/24: Pt has done slightly more practicing since last session. SLP appreciates pt's results from her neuropsych evaluation. Nothing of note other than likely age-related cognitive changes.Today in multi-sentence responses pt's volume was WNL 90% of the time. SLP told pt to try to practice more this week to ensure best possible opportunity for improvement.  07/15/24: Pt filled out PROM today - scored 18/32, greater than initial administration. Today pt maintained WNL volume with sentence and two-sentence responses, and was aware of lower than WNL responses demonstrated by self correcting 80% of the time. AB was noted in sentence responses 90%. Homework provided.  07/06/24: SLP used min A initially for pt to use intent and deliberate speech to improve volume and clarity of speech. Joel benefited from rare min A for intent in 2-4 minute conversational segments x10. Pt was provided homework for multisentence responses using intent and purpose, lifting voice.   07/01/24: SLP assisted pt in speaking with more intent and purpose. In phrase level practice for warm up pt average volume was 71dB, and in sentence warm up pt's voice was average 71dB. In  multiple sentence responses pt maintained intentional speech 85% of the time, with average 70dB. Homework to extend to today's session. In simple conversation pt's average was 70dB.  06/24/24: Pt admitted her practice was not 100% as she would have liked since last session. In phrase warm up pt used intent 90% of the time average 72dB. With sentneces from homework pt used intent 5/6, with average 70dB. Sentence responses with min-mod complex answers required average 70dB and intent noted 85% of the time; At times loudness decay heard. Homework provided.  In simple conversation pt's volume decr'd to average 69dB with usual loudness decay.  06/22/24: Pt not adhering to practice schedule but is still making attempt to practice consistently. SLP encouraged [place] to cont to strive to practice as directed and provided two suggestions to accomplish this. Today with sentence responses pt used intentional purposeful speech 90% of the time, average 70dB. Short Q and A conversational responses following pt's initial response resulted in pt producing average of 68dB. When asked to repeat or to use purposeful effortful speech pt's volume improved to 71dB average. Pt provided with homework.   06/15/24: No more falls since eval. Pt has been practicing at home. Word level responses were 95% with intent noted. Sentence level responses were noted with intent/purpose/mindfulness 88% of the time - loudness 71dB average. Pt self corrected once, and improved her production to 100% when asked by SLP to repeat due to not using purpose with her answer. Pt stated she felt like she was shouting at someone so she didn't want to use intent. SLP used digital recorder x2 to use auditory feedback to demo to pt she was not shouting. She agreed with SLP that her speech was WNL volume. SLP showed her using diagram why she feels like she is shouting. Pt to cont to practice at sentence level.   06/12/24: To warm up SLP gave pt sentences to read  with intent. Pt with 80% success. SLP targeted phrases and sentences with pt using intent and purpose in her speech. Pt demonstrated incr'd mindfulness with responses 75% of the time, improving articulation and making volume more consistently WNL range. SLP with explanation that pt's changing a habit to being more intentional and purpseful with her speech will benefit later as pt stated, If I do this why does it help? It's no different for the person I am talking to.   06/03/24: SLP targeted word level intent/purposeful speech with near 100% success. Pt  notably had difficulty with [employer]. Seemed concerned about this. SLP told pt to write down words she feels she gets tied up on and practice those  words if they are common words; SLP provided examples. SLP moved to 3-4 word phrases/sentences and pt success with WNL volume and clarity 90%. Pt to practice phrase level responses until next session. Pt cancelled some appointments after last session to bring some weeks to x1/week, and pt asked SLP if she could get them back. SLP asked pt to check with support rep.   05/19/24: SLP began session with providing pt with CES. SLP began today with pt reading with more intent, purpose, and more deliberate. Average loudness was 69dB with SLP providing consistent mod-max A faded to occasional mod A for effort/intent. Specific cues not to be timid about speaking louder. SLP also used the phrase speak out. Pt to practice reading words with intent x20, BID.   05/12/24: SLP explained role in ST, need for pt to be more intentional and purposeful with speech, discussed pt's candidacy for skilled ST and based upon results today pt will be good candidate for improving speech loudness and clarity.  PATIENT EDUCATION: Education details: see treatment date this date, above Person educated: Patient Education method: Programmer, Multimedia, Demonstration, and Verbal cues Education comprehension: verbalized understanding, returned  demonstration, verbal cues required, and needs further education  HOME EXERCISE PROGRAM: Eventually intentional and deliberate speech   GOALS: Goals reviewed with patient? No  SHORT TERM GOALS: Target date:  07/07/24 due to visit number  At visit 9 pt will  have improved PROM compared to initial measurement Baseline: Goal status: met  2.  Pt will improve loudness to average 70dB in simple conversation in 2 sessions Baseline:  Goal status: not met, cont in LTGs  3.  Pt will demo AB in 5 minutes at rest, 85% success in 2 sessions Baseline:  Goal status: partially met, cont in LTGs  4.  Pt will demo AB in sentence responses 85% success in 2 sessions Baseline: 06/15/24 Goal status: met   LONG TERM GOALS: Target date:  08/14/24  Pt will improve PROM compared to STG measurement Baseline:  Goal status: continue  2.  Pt will demo AB in 5 minutes simple to mod complex conversation, 85% success, in 3 sessions Baseline: 07/28/24, 08/03/24 Goal status: cont  3. Pt will average 70dB in 8 minutes conversation Baseline:  Goal status: INITIAL  ASSESSMENT:  CLINICAL IMPRESSION: Patient is a 78 y.o. F who was seen today for treatment of speech in light of PD. See treatment date above for today's date for further details on today's session. She continues to demo improvement in ability to use more intent and in so doing, incr her speech clarity and maintain WNL volume in conversation. Pt does not endorse s/sx dysphagia at this time.   OBJECTIVE IMPAIRMENTS: Objective impairments include dysarthria. These impairments are limiting patient from household responsibilities, ADLs/IADLs, and effectively communicating at home and in community.Factors affecting potential to achieve goals and functional outcome are none noted today.. Patient will benefit from skilled SLP services to address above impairments and improve overall function.  REHAB POTENTIAL: Good  PLAN:  SLP FREQUENCY:  1-2x/week  SLP DURATION: 17 total  sessions  PLANNED INTERVENTIONS: Environmental controls, Cueing hierachy, Internal/external aids, Oral motor exercises, Functional tasks, SLP instruction and feedback, Compensatory strategies, Patient/family education, and [id number] Treatment of speech (30 or 45 min)     Stellar Gensel, CCC-SLP 08/03/2024, 1:57 PM

## 2024-08-06 ENCOUNTER — Encounter: Admitting: Psychology

## 2024-08-07 ENCOUNTER — Ambulatory Visit: Admitting: Psychiatry

## 2024-08-10 ENCOUNTER — Ambulatory Visit: Admitting: Psychiatry

## 2024-08-11 NOTE — Progress Notes (Unsigned)
 Assessment/Plan:   1.  Parkinsons's disease  -much improved after starting levodopa .  She will continue carbidopa /levodopa  25/100, 1 tablet 3 times per day.  She initially thought it made her sleepy, and we tried to change her to Rytary .  She also thought that made her sleepy, so we stopped all Parkinson's medication and she remained sleepy.  Thereby, we concluded that it was not the medication making her sleepy and we ended up putting her back on immediate release levodopa , 1 tablet 3 times per day.             -Discussed increasing exercise.             -discussed Parkinsons Disease GeneRation study.  Father with Parkinsons Disease    2.  Memory loss, per patient             - Neurocognitive testing with Dr. Gayland in September, 2025 was normal.  She felt that subjective memory complaints were attributable to anxiety, depression, grief and tendencies toward self-critical appraisal.   3.  Depression             -Currently seeing psychiatry APP at [facility] and on Cymbalta .  Has tried and failed numerous antidepressants.  -She is following with counseling as well as the APP  -Adderrall added by APP for EDS in January, 2025 but she hasn't started it yet.  nPSG could be of value, but that cannot be done while on Adderall.  Subjective:   Andrea Burke was seen today in follow up for Parkinsons disease, diagnosed last visit.  My previous records were reviewed prior to todays visit as well as outside records available to me.  Patient is with her husband who supplements the history.  Patient remains on levodopa .  She is taking it faithfully.  She had neurocognitive testing with Dr. Gayland July 02, 2024 and that was completely normal.  Current prescribed movement disorder medications: Carbidopa /levodopa  25/100, 1 tablet 3 times per day.   PREVIOUS MEDICATIONS: Sinemet ; Rytary  (she felt that she was the same degree of sleepy on Rytary  that she was with IR levodopa , but then we held all  Parkinson's medicines and she stated that she still felt sleepy, so that was not the medication)  Prior medications from psychiatry:  Mirtazapine (increased appetite, possibly dreaming);  Lexapro (nightmares); Sertraline-agitation, insomnia, impaired concentration Paxil-intolerable side effects Fluoxetine-intolerable side effects Viibryd - Partial improvement on 20 mg. Does not want to take doses above 20 mg. Cymbalta  Wellbutrin- Severe tremors Lithium  Hydroxyzine- has taken for itching.  Xanax Deplin Gabapentin- prescribed for pain  ALLERGIES:   Allergies  Allergen Reactions   Doxycycline Calcium    Penicillins     CURRENT MEDICATIONS:  No outpatient medications have been marked as taking for the 08/13/24 encounter (Appointment) with Beckam Abdulaziz, Asberry RAMAN, DO.     Objective:   PHYSICAL EXAMINATION:    VITALS:   There were no vitals filed for this visit.    GEN:  The patient appears stated age and is in NAD. HEENT:  Normocephalic, atraumatic.  The mucous membranes are moist. The superficial temporal arteries are without ropiness or tenderness.  Neurological examination:  Orientation: The patient is alert and oriented x3.  Cranial nerves: There is good facial symmetry.  Extraocular muscles are intact. The visual fields are full to confrontational testing. The speech is fluent and clear. Soft palate rises symmetrically and there is no tongue deviation. Hearing is intact to conversational tone. Sensation: Sensation is intact to light  touch throughout Motor: Strength is at least antigravity x 4  Movement examination: Tone: There is some trouble relaxing but overall good tone Abnormal movements: there is no tremor today Coordination:  There is only decremation with toe taps on the L Gait and Station: Patient ambulates well in the hall.  I have reviewed and interpreted the following labs independently    Chemistry      Component Value Date/Time   NA 135 03/19/2024 1622    K 3.8 03/19/2024 1622   CL 103 03/19/2024 1622   CO2 20 (L) 03/19/2024 1622   BUN 15 03/19/2024 1622   CREATININE 0.63 03/19/2024 1622   CREATININE 0.72 07/31/2014 1222      Component Value Date/Time   CALCIUM 9.1 03/19/2024 1622   ALKPHOS 54 07/31/2014 1222   AST 21 07/31/2014 1222   ALT 17 07/31/2014 1222   BILITOT 0.6 07/31/2014 1222       Lab Results  Component Value Date   WBC 7.7 03/19/2024   HGB 13.5 03/19/2024   HCT 40.8 03/19/2024   MCV 87.7 03/19/2024   PLT 155 03/19/2024    Lab Results  Component Value Date   TSH 1.45 09/29/2018     Cc:  Allen, Lauraine CROME, PA-C

## 2024-08-13 ENCOUNTER — Ambulatory Visit (INDEPENDENT_AMBULATORY_CARE_PROVIDER_SITE_OTHER): Admitting: Neurology

## 2024-08-13 ENCOUNTER — Encounter: Payer: Self-pay | Admitting: Neurology

## 2024-08-13 VITALS — BP 118/74 | HR 75 | Wt 132.6 lb

## 2024-08-13 DIAGNOSIS — R413 Other amnesia: Secondary | ICD-10-CM | POA: Diagnosis not present

## 2024-08-13 DIAGNOSIS — F321 Major depressive disorder, single episode, moderate: Secondary | ICD-10-CM | POA: Diagnosis not present

## 2024-08-13 DIAGNOSIS — G20A1 Parkinson's disease without dyskinesia, without mention of fluctuations: Secondary | ICD-10-CM

## 2024-08-13 MED ORDER — CARBIDOPA-LEVODOPA 25-100 MG PO TABS: 1.0000 | ORAL_TABLET | Freq: Three times a day (TID) | ORAL | 1 refills | Status: AC

## 2024-08-13 NOTE — Patient Instructions (Signed)
 Hang in there!  You are looking good!  Here are some resources/books that you may find helpful as you navigate the challenges of Parkinson's Disease  1.  Parkinson's treatement: 10 secrets to a happier life by Ozell Games, MD 2.  Navigating Life with Parkinsons disease by Sotirios Parashos 3.  My degeneration: A journey through Parkinsons Marshal Kapur - Shohl) 4.  Every Victory counts (I believe this one if free through [employer]) 5.  Lucky Man by Ozell JINNY Baseman 6.  101 Questions & Answers about Parkinson's by Erle Beard 7.  Parkinsons Disease Treatment Book by JE Ahiskog

## 2024-08-19 ENCOUNTER — Ambulatory Visit: Admitting: Psychiatry

## 2024-08-19 DIAGNOSIS — F331 Major depressive disorder, recurrent, moderate: Secondary | ICD-10-CM

## 2024-08-19 NOTE — Progress Notes (Signed)
 [facility] Counselor/Therapist Progress Note  Patient ID: Andrea Burke, MRN: [id number],    Date: 08/19/2024  Time Spent: 53 minutes   Treatment Type: Individual Therapy  Reported Symptoms:  anxiety, some depression, worrying about her and husband's health (husband is to have knee replacement surgery Dec 11, 2025some grief over mom's death that comes and goes   Mental Status Exam:  Appearance:   Casual and Neat     Behavior:  Appropriate, Sharing, and Motivated  Motor:  They are pretty ok but some shakiness/tremor in her arm and related to her Parkinson's  Speech/Language:   Clear and Coherent  Affect:  Depressed and anxious  Mood:  anxious and depressed  Thought process:  goal directed  Thought content:    Rumination  Sensory/Perceptual disturbances:    WNL  Orientation:  oriented to person, place, time/date, situation, day of week, month of year, year, and stated date of Nov. 12, 2025  Attention:  Fair  Concentration:  Fair  Memory:  Some occasional forgetting and am told it's normal aging  Fund of knowledge:   Good  Insight:    Good and Fair  Judgment:   Good  Impulse Control:  Good   Risk Assessment: Danger to Self:  No Self-injurious Behavior: No Danger to Others: No Duty to Warn:no Physical Aggression / Violence:No  Access to Firearms a concern: No  Gang Involvement:No   Subjective:  Patient today reporting symptom of grief re:mom's death, depression, sadness, and anxiety with too many things happening at once.  Husband's surgery coming up, Christmas, Thanksgiving all coming up soon. Very aware of her lingering sadness over mother's death, and worries about getting older and older each day. Very tearful in part of session and worked through some of her fearfulness as well as her tearfulness. Trying to be more positive and work on strategies but it's not been that long since mom's death. Gaining more strength gradually and believing in herself more.  Less fearful at end of session and feeling some relief after letting her tears out and talking through some of her fears of the future, including husband's health issues and upcoming surgery in a couple weeks.    Interventions: Cognitive Behavioral Therapy, Solution-Oriented/Positive Psychology, and Ego-Supportive Long-Term goal: (measurable)  Enhance the ability to handle effectively the full variety of life's anxieties. Patient will eventually rate herself as a 3 or under on a 1-10 scale for depression and for anxiety, for a period of at least 2 months.  Strategies:  Patient will work on re-framing thoughts that have been leading her to feel more anxious.  The re-framed thoughts will support more calmness and personal empowerment for patient.     Short Term goal:  Increase understanding of beliefs and messages that produce worry and anxiety. Strategies: Patient will work with therapist to gain more understanding of how anxiety and worry are supported by certain beliefs and messages.  She will be able to give at least 3 examples of such messages in an upcoming session.   Diagnosis:   ICD-10-CM   1. Major depressive disorder, recurrent episode, moderate (HCC)  F33.1      Plan: Patient did well in session today working more intentionally on some significant grief issues re: death of mother and some concerns re: health of patient and her husband as they get older in age. Shared very openly in session today and did seem to feel stronger and more supported by end of session. Will see  again in 3 weeks. Encouraged patient to maintain contact with people who are supportive of her, stay on her prescribed medications, maintain good contact with extended family, take her medications as prescribed, and recognize the strength that she continues to show with goal-directed behaviors enabling her to move in a forward direction and supporting her overall improved emotional health, and her outlook into the  future.  Rashawna Scoles is making progress and needs to keep working on her goal-directed behaviors to help her move in a healthier and more hopeful direction as she works to remain motivated and focused on her therapy goals helping her be able to be as active and involved as possible with her family and other people.  Goal review and progress/challenges noted with patient.  Next appointment within 4 weeks.   Barnie Bunde, LCSW

## 2024-08-20 ENCOUNTER — Ambulatory Visit: Attending: Neurology

## 2024-08-20 DIAGNOSIS — R4701 Aphasia: Secondary | ICD-10-CM | POA: Insufficient documentation

## 2024-08-20 DIAGNOSIS — R471 Dysarthria and anarthria: Secondary | ICD-10-CM | POA: Insufficient documentation

## 2024-08-20 NOTE — Therapy (Addendum)
 OUTPATIENT SPEECH LANGUAGE PATHOLOGY PARKINSON'S TREATMENT/RECERTIFICATION   Patient Name: Andrea Burke MRN: [id number] DOB:1946-01-24, 78 y.o., female Today's Date: 08/20/2024  PCP: Allen Lauraine CROME, PA-C REFERRING PROVIDER: Evonnie Asberry RAMAN, DO  END OF SESSION:  End of Session - 08/20/24 1538    Visit Number 15      Number of Visits 17     Date for Recertification  09/04/24     SLP Start Time 1445    SLP Stop Time  1531    SLP Time Calculation (min) 46 min                 Past Medical History:  Diagnosis Date   Cancer (HCC)    KIDNEY   Discoid lupus    Endometriosis    Headache(784.0)    Insomnia    Kidney disease    Lupus    Osteopenia    Urinary incontinence    Past Surgical History:  Procedure Laterality Date   CATARACT EXTRACTION, BILATERAL     PELVIC LAPAROSCOPY     DIAG LASER LAP-ENDOMETRIOSIS   REMOVAL OF LEFT KIDNEY  10/08/2006   Patient Active Problem List   Diagnosis Date Noted   Major depressive disorder, single episode, moderate (HCC) 08/06/2018   Moderate major depression (HCC) 06/23/2018   Anxiety disorder 06/23/2018   Pruritus 04/01/2017   Onychomycosis 05/12/2012   Lentigines 05/12/2012   Discoid lupus    Cancer (HCC)    Headache    Insomnia    Endometriosis    Osteopenia     Speech Therapy Progress Note  Dates of Reporting Period: 07/15/24 to present  Subjective Statement: See below  Objective: Pt is improving with speech loudness. See below  Goal Update: See below  Plan: See pt for one-two more sessions  Reason Skilled Services are Required: pt would like to ensure progress over time   ONSET DATE: script 04/21/24, diagnosed fall 2024  REFERRING DIAG:  R47.1 (ICD-10-CM) - Dysarthria    THERAPY DIAG:  Dysarthria and anarthria  Rationale for Evaluation and Treatment: Rehabilitation  SUBJECTIVE:   SUBJECTIVE STATEMENT: I can do it.   Pt accompanied by: self  PERTINENT HISTORY: See above   PAIN:   Are you having pain? Yes - mid-low back 5/10.   FALLS: Has patient fallen in last 6 months?  Yes, Number of falls: one   PATIENT GOALS: improve speech ability  OBJECTIVE:  Note: Objective measures were completed at Evaluation unless otherwise noted.  DIAGNOSTIC FINDINGS:  02/26/23 CLINICAL DATA:  Headache with increasing frequency or severity. EXAM: CT HEAD WITHOUT CONTRAST   TECHNIQUE: Contiguous axial images were obtained from the base of the skull through the vertex without intravenous contrast.   RADIATION DOSE REDUCTION: This exam was performed according to the departmental dose-optimization program which includes automated exposure control, adjustment of the mA and/or kV according to patient size and/or use of iterative reconstruction technique.   COMPARISON:  None Available.   FINDINGS: Brain: No evidence of acute infarction, hemorrhage, hydrocephalus, extra-axial collection or mass lesion/mass effect. Mild for age cerebral volume loss   Vascular: No hyperdense vessel or unexpected calcification.   Skull: Normal. Negative for fracture or focal lesion.   Sinuses/Orbits: No acute finding.   IMPRESSION: No acute or reversible finding.   PATIENT REPORTED OUTCOME MEASURES (PROM): Communication Effectiveness Survey: (CES) provided 05/19/24 and pt scored herself 16/32 with higher scores indicating more effective communication across situations. Pt scored herself 1 (not at all effective) with taking  part in a conversation in a noisy environment.  In re-administration on 07/15/24 pt scored 18/32, with 3 (mostly effective) with having a conversation with family member at home, and having a conversation with a familiar person over the phone. All other responses were 2 (somewhat effective).                                                                                                                            TREATMENT DATE:  08/20/24: SLP guided pt through  conversational segments of 8 minutes. Pt maintained WNL volume 90% of the time. SLP increased the challenge by adding background noise. Pt performance was stable during this challenge. SLP educated pt about purpose of using intent during speech for optimizing muscular engagement for speech volume and strength. As pt is nearing d/c, she was curious about future changes related to speech/swallowing. SLP plans to review this in upcoming session.   08/03/24: SLP guided pt through conversational segments of 2-4 minutes. Pt maintained WNL volume 80% of the time with these. Because of pt's s, SLP reiterated pt will need to practice speech after d/c and helped pt problem solve which people she could talk with after d/c.   07/28/24: 3-4 minute conversational segments were measured today for WNL volume. With occasional min cues pt maintained WNL volume x5. SLP took pt outside to use more generalized surroundings and pt maintained intelligible volume in this setting also, for 7 minutes. SLP and pt agreed she could decr to once/week.    07/24/24: Pt states she had little stumbles with her speech prior to coming to ST. Pt had questions about decline of speech and SLP shared with pt that right now she's doing everything possible to afford success with her speech. SLP guided pt in short conversational segments of 45-90 seconds pt maintained average WNL volume 80% of the time. After 7 of these pt stated, I need to continue to use more effort when I talk. At the end of the session she stated s statement.  07/17/24: Pt has done slightly more practicing since last session. SLP appreciates pt's results from her neuropsych evaluation. Nothing of note other than likely age-related cognitive changes.Today in multi-sentence responses pt's volume was WNL 90% of the time. SLP told pt to try to practice more this week to ensure best possible opportunity for improvement.  07/15/24: Pt filled out PROM today - scored 18/32,  greater than initial administration. Today pt maintained WNL volume with sentence and two-sentence responses, and was aware of lower than WNL responses demonstrated by self correcting 80% of the time. AB was noted in sentence responses 90%. Homework provided.  07/06/24: SLP used min A initially for pt to use intent and deliberate speech to improve volume and clarity of speech. Allisyn benefited from rare min A for intent in 2-4 minute conversational segments x10. Pt was provided homework for multisentence responses using intent and purpose, lifting voice.   07/01/24: SLP assisted pt in  speaking with more intent and purpose. In phrase level practice for warm up pt average volume was 71dB, and in sentence warm up pt's voice was average 71dB. In multiple sentence responses pt maintained intentional speech 85% of the time, with average 70dB. Homework to extend to today's session. In simple conversation pt's average was 70dB.  06/24/24: Pt admitted her practice was not 100% as she would have liked since last session. In phrase warm up pt used intent 90% of the time average 72dB. With sentneces from homework pt used intent 5/6, with average 70dB. Sentence responses with min-mod complex answers required average 70dB and intent noted 85% of the time; At times loudness decay heard. Homework provided. In simple conversation pt's volume decr'd to average 69dB with usual loudness decay.  06/22/24: Pt not adhering to practice schedule but is still making attempt to practice consistently. SLP encouraged Samiha to cont to strive to practice as directed and provided two suggestions to accomplish this. Today with sentence responses pt used intentional purposeful speech 90% of the time, average 70dB. Short Q and A conversational responses following pt's initial response resulted in pt producing average of 68dB. When asked to repeat or to use purposeful effortful speech pt's volume improved to 71dB average. Pt provided with  homework.   06/15/24: No more falls since eval. Pt has been practicing at home. Word level responses were 95% with intent noted. Sentence level responses were noted with intent/purpose/mindfulness 88% of the time - loudness 71dB average. Pt self corrected once, and improved her production to 100% when asked by SLP to repeat due to not using purpose with her answer. Pt stated she felt like she was shouting at someone so she didn't want to use intent. SLP used digital recorder x2 to use auditory feedback to demo to pt she was not shouting. She agreed with SLP that her speech was WNL volume. SLP showed her using diagram why she feels like she is shouting. Pt to cont to practice at sentence level.   06/12/24: To warm up SLP gave pt sentences to read with intent. Pt with 80% success. SLP targeted phrases and sentences with pt using intent and purpose in her speech. Pt demonstrated incr'd mindfulness with responses 75% of the time, improving articulation and making volume more consistently WNL range. SLP with explanation that pt's changing a habit to being more intentional and purpseful with her speech will benefit later as pt stated, If I do this why does it help? It's no different for the person I am talking to.   06/03/24: SLP targeted word level intent/purposeful speech with near 100% success. Pt  notably had difficulty with [employer]. Seemed concerned about this. SLP told pt to write down words she feels she gets tied up on and practice those  words if they are common words; SLP provided examples. SLP moved to 3-4 word phrases/sentences and pt success with WNL volume and clarity 90%. Pt to practice phrase level responses until next session. Pt cancelled some appointments after last session to bring some weeks to x1/week, and pt asked SLP if she could get them back. SLP asked pt to check with support rep.   05/19/24: SLP began session with providing pt with CES. SLP began today with pt reading with more intent,  purpose, and more deliberate. Average loudness was 69dB with SLP providing consistent mod-max A faded to occasional mod A for effort/intent. Specific cues not to be timid about speaking louder. SLP also used the phrase speak  out. Pt to practice reading words with intent x20, BID.   05/12/24: SLP explained role in ST, need for pt to be more intentional and purposeful with speech, discussed pt's candidacy for skilled ST and based upon results today pt will be good candidate for improving speech loudness and clarity.  PATIENT EDUCATION: Education details: see treatment date this date, above Person educated: Patient Education method: Programmer, Multimedia, Demonstration, and Verbal cues Education comprehension: verbalized understanding, returned demonstration, verbal cues required, and needs further education  HOME EXERCISE PROGRAM: Eventually intentional and deliberate speech   GOALS: Goals reviewed with patient? No  SHORT TERM GOALS: Target date:  07/07/24 due to visit number  At visit 9 pt will have improved PROM compared to initial measurement Baseline: Goal status: met  2.  Pt will improve loudness to average 70dB in simple conversation in 2 sessions Baseline:  Goal status: not met, cont in LTGs  3.  Pt will demo AB in 5 minutes at rest, 85% success in 2 sessions Baseline:  Goal status: partially met, cont in LTGs  4.  Pt will demo AB in sentence responses 85% success in 2 sessions Baseline: 06/15/24 Goal status: met   LONG TERM GOALS: Target date:  09/04/24  Pt will improve PROM compared to STG measurement Baseline:  Goal status: continue  2.  Pt will demo AB in 5 minutes simple to mod complex conversation, 85% success, in 3 sessions Baseline: 07/28/24, 08/03/24 Goal status: cont  3. Pt will average 70dB in 8 minutes conversation Baseline:  Goal status: INITIAL  ASSESSMENT:  CLINICAL IMPRESSION: RECERT TODAY. Patient is a 78 y.o. F who was seen today for treatment of  speech in light of PD. See treatment date above for today's date for further details on today's session. She continues to demo improvement in ability to use more intent and in so doing, incr her speech clarity and maintain WNL volume in conversation. Pt does not endorse s/sx dysphagia at this time.   OBJECTIVE IMPAIRMENTS: Objective impairments include dysarthria. These impairments are limiting patient from household responsibilities, ADLs/IADLs, and effectively communicating at home and in community.Factors affecting potential to achieve goals and functional outcome are none noted today.. Patient will benefit from skilled SLP services to address above impairments and improve overall function.  REHAB POTENTIAL: Good  PLAN:  SLP FREQUENCY: 1-2x/week  SLP DURATION: 17 total  sessions  PLANNED INTERVENTIONS: Environmental controls, Cueing hierachy, Internal/external aids, Oral motor exercises, Functional tasks, SLP instruction and feedback, Compensatory strategies, Patient/family education, and [id number] Treatment of speech (30 or 45 min)     Waddell Music, CF-SLP 08/20/2024, 3:39 PM

## 2024-08-21 ENCOUNTER — Ambulatory Visit: Admitting: Psychiatry

## 2024-08-25 DIAGNOSIS — Z1211 Encounter for screening for malignant neoplasm of colon: Secondary | ICD-10-CM | POA: Diagnosis not present

## 2024-08-25 DIAGNOSIS — K5900 Constipation, unspecified: Secondary | ICD-10-CM | POA: Diagnosis not present

## 2024-08-27 DIAGNOSIS — Z85528 Personal history of other malignant neoplasm of kidney: Secondary | ICD-10-CM | POA: Diagnosis not present

## 2024-08-27 DIAGNOSIS — E539 Vitamin B deficiency, unspecified: Secondary | ICD-10-CM | POA: Diagnosis not present

## 2024-08-27 DIAGNOSIS — M85851 Other specified disorders of bone density and structure, right thigh: Secondary | ICD-10-CM | POA: Diagnosis not present

## 2024-08-27 DIAGNOSIS — G20B1 Parkinson's disease with dyskinesia, without mention of fluctuations: Secondary | ICD-10-CM | POA: Diagnosis not present

## 2024-08-27 DIAGNOSIS — E559 Vitamin D deficiency, unspecified: Secondary | ICD-10-CM | POA: Diagnosis not present

## 2024-08-27 DIAGNOSIS — M85852 Other specified disorders of bone density and structure, left thigh: Secondary | ICD-10-CM | POA: Diagnosis not present

## 2024-08-27 DIAGNOSIS — Z0000 Encounter for general adult medical examination without abnormal findings: Secondary | ICD-10-CM | POA: Diagnosis not present

## 2024-08-27 DIAGNOSIS — L932 Other local lupus erythematosus: Secondary | ICD-10-CM | POA: Diagnosis not present

## 2024-08-28 ENCOUNTER — Ambulatory Visit

## 2024-08-28 DIAGNOSIS — R4701 Aphasia: Secondary | ICD-10-CM

## 2024-08-28 DIAGNOSIS — R471 Dysarthria and anarthria: Secondary | ICD-10-CM

## 2024-08-28 NOTE — Therapy (Signed)
 OUTPATIENT SPEECH LANGUAGE PATHOLOGY [facility]charge   Patient Name: Andrea Burke MRN: [id number] DOB:02-Aug-1946, 78 y.o., female Today's Date: 08/28/2024  PCP: Allen Lauraine CROME, PA-C REFERRING PROVIDER: Evonnie Asberry RAMAN, DO  END OF SESSION:  Visit Number 16      Number of Visits 17     Date for Recertification  09/04/24     SLP Start Time 0936    SLP Stop Time  1016     SLP Time Calculation (min) 40 min     Activity Tolerance Patient tolerated treatment well           Past Medical History:  Diagnosis Date   Cancer (HCC)    KIDNEY   Discoid lupus    Endometriosis    Headache(784.0)    Insomnia    Kidney disease    Lupus    Osteopenia    Urinary incontinence    Past Surgical History:  Procedure Laterality Date   CATARACT EXTRACTION, BILATERAL     PELVIC LAPAROSCOPY     DIAG LASER LAP-ENDOMETRIOSIS   REMOVAL OF LEFT KIDNEY  10/08/2006   Patient Active Problem List   Diagnosis Date Noted   Major depressive disorder, single episode, moderate (HCC) 08/06/2018   Moderate major depression (HCC) 06/23/2018   Anxiety disorder 06/23/2018   Pruritus 04/01/2017   Onychomycosis 05/12/2012   Lentigines 05/12/2012   Discoid lupus    Cancer (HCC)    Headache    Insomnia    Endometriosis    Osteopenia     Speech Therapy Discharge Summary  Number of therapy sessions: 16  Subjective Statement: PT completed 16 ST focusing on speech loudness  Objective: Pt  has improved with speech loudness; Reports people do not ask her to repeat herself as often. See below.  Goal Update: See below.  Plan: Discharge today. Pt should be seen in approx 6 months for screenings. She agrees to d/c due to pleased with current progress level.      ONSET DATE: script 04/21/24, diagnosed fall 2024  REFERRING DIAG:  R47.1 (ICD-10-CM) - Dysarthria    THERAPY DIAG:  Dysarthria and anarthria  Rationale for Evaluation and Treatment: Rehabilitation  SUBJECTIVE:    SUBJECTIVE STATEMENT: I need to continue to think about projecting and speaking out.   Pt accompanied by: self  PERTINENT HISTORY: See above   PAIN:  Are you having pain? Yes - mid-low back 5/10.   FALLS: Has patient fallen in last 6 months?  Yes, Number of falls: one   PATIENT GOALS: improve speech ability  OBJECTIVE:  Note: Objective measures were completed at Evaluation unless otherwise noted.  DIAGNOSTIC FINDINGS:  02/26/23 CLINICAL DATA:  Headache with increasing frequency or severity. EXAM: CT HEAD WITHOUT CONTRAST   TECHNIQUE: Contiguous axial images were obtained from the base of the skull through the vertex without intravenous contrast.   RADIATION DOSE REDUCTION: This exam was performed according to the departmental dose-optimization program which includes automated exposure control, adjustment of the mA and/or kV according to patient size and/or use of iterative reconstruction technique.   COMPARISON:  None Available.   FINDINGS: Brain: No evidence of acute infarction, hemorrhage, hydrocephalus, extra-axial collection or mass lesion/mass effect. Mild for age cerebral volume loss   Vascular: No hyperdense vessel or unexpected calcification.   Skull: Normal. Negative for fracture or focal lesion.   Sinuses/Orbits: No acute finding.   IMPRESSION: No acute or reversible finding.   PATIENT REPORTED OUTCOME MEASURES (PROM): Communication Effectiveness Survey: (CES) provided  05/19/24 and pt scored herself 16/32 with higher scores indicating more effective communication across situations. Pt scored herself 1 (not at all effective) with taking part in a conversation in a noisy environment.  In re-administration on 07/15/24 pt scored 18/32, with 3 (mostly effective) with having a conversation with family member at home, and having a conversation with a familiar person over the phone. All other responses were 2 (somewhat effective).                                                                                                                             TREATMENT DATE:  08/28/24: Pt stated she would like to practice louder speech in conversation. Segments of 9 minutes x4 pt maintained WNL volume 85-90%. I was thinking about it, pt stated after the third segment. SLP explained to pt she would need to continue, thinking that every time she opens her mouth is another opportunity to practice speaking out. Pt agreed.  08/20/24: SLP guided pt through conversational segments of 8 minutes. Pt maintained WNL volume 90% of the time. SLP increased the challenge by adding background noise. Pt performance was stable during this challenge. SLP educated pt about purpose of using intent during speech for optimizing muscular engagement for speech volume and strength. As pt is nearing d/c, she was curious about future changes related to speech/swallowing. SLP plans to review this in upcoming session.   08/03/24: SLP guided pt through conversational segments of 2-4 minutes. Pt maintained WNL volume 80% of the time with these. Because of pt's s, SLP reiterated pt will need to practice speech after d/c and helped pt problem solve which people she could talk with after d/c.   07/28/24: 3-4 minute conversational segments were measured today for WNL volume. With occasional min cues pt maintained WNL volume x5. SLP took pt outside to use more generalized surroundings and pt maintained intelligible volume in this setting also, for 7 minutes. SLP and pt agreed she could decr to once/week.    07/24/24: Pt states she had little stumbles with her speech prior to coming to ST. Pt had questions about decline of speech and SLP shared with pt that right now she's doing everything possible to afford success with her speech. SLP guided pt in short conversational segments of 45-90 seconds pt maintained average WNL volume 80% of the time. After 7 of these pt stated, I need to  continue to use more effort when I talk. At the end of the session she stated s statement.  07/17/24: Pt has done slightly more practicing since last session. SLP appreciates pt's results from her neuropsych evaluation. Nothing of note other than likely age-related cognitive changes.Today in multi-sentence responses pt's volume was WNL 90% of the time. SLP told pt to try to practice more this week to ensure best possible opportunity for improvement.  07/15/24: Pt filled out PROM today - scored 18/32, greater than initial administration. Today pt maintained WNL volume with  sentence and two-sentence responses, and was aware of lower than WNL responses demonstrated by self correcting 80% of the time. AB was noted in sentence responses 90%. Homework provided.  07/06/24: SLP used min A initially for pt to use intent and deliberate speech to improve volume and clarity of speech. Lesle benefited from rare min A for intent in 2-4 minute conversational segments x10. Pt was provided homework for multisentence responses using intent and purpose, lifting voice.   07/01/24: SLP assisted pt in speaking with more intent and purpose. In phrase level practice for warm up pt average volume was 71dB, and in sentence warm up pt's voice was average 71dB. In multiple sentence responses pt maintained intentional speech 85% of the time, with average 70dB. Homework to extend to today's session. In simple conversation pt's average was 70dB.  06/24/24: Pt admitted her practice was not 100% as she would have liked since last session. In phrase warm up pt used intent 90% of the time average 72dB. With sentneces from homework pt used intent 5/6, with average 70dB. Sentence responses with min-mod complex answers required average 70dB and intent noted 85% of the time; At times loudness decay heard. Homework provided. In simple conversation pt's volume decr'd to average 69dB with usual loudness decay.  06/22/24: Pt not adhering to  practice schedule but is still making attempt to practice consistently. SLP encouraged Mirta to cont to strive to practice as directed and provided two suggestions to accomplish this. Today with sentence responses pt used intentional purposeful speech 90% of the time, average 70dB. Short Q and A conversational responses following pt's initial response resulted in pt producing average of 68dB. When asked to repeat or to use purposeful effortful speech pt's volume improved to 71dB average. Pt provided with homework.   06/15/24: No more falls since eval. Pt has been practicing at home. Word level responses were 95% with intent noted. Sentence level responses were noted with intent/purpose/mindfulness 88% of the time - loudness 71dB average. Pt self corrected once, and improved her production to 100% when asked by SLP to repeat due to not using purpose with her answer. Pt stated she felt like she was shouting at someone so she didn't want to use intent. SLP used digital recorder x2 to use auditory feedback to demo to pt she was not shouting. She agreed with SLP that her speech was WNL volume. SLP showed her using diagram why she feels like she is shouting. Pt to cont to practice at sentence level.   06/12/24: To warm up SLP gave pt sentences to read with intent. Pt with 80% success. SLP targeted phrases and sentences with pt using intent and purpose in her speech. Pt demonstrated incr'd mindfulness with responses 75% of the time, improving articulation and making volume more consistently WNL range. SLP with explanation that pt's changing a habit to being more intentional and purpseful with her speech will benefit later as pt stated, If I do this why does it help? It's no different for the person I am talking to.   06/03/24: SLP targeted word level intent/purposeful speech with near 100% success. Pt  notably had difficulty with [employer]. Seemed concerned about this. SLP told pt to write down words she feels she  gets tied up on and practice those  words if they are common words; SLP provided examples. SLP moved to 3-4 word phrases/sentences and pt success with WNL volume and clarity 90%. Pt to practice phrase level responses until next session. Pt cancelled  some appointments after last session to bring some weeks to x1/week, and pt asked SLP if she could get them back. SLP asked pt to check with support rep.   05/19/24: SLP began session with providing pt with CES. SLP began today with pt reading with more intent, purpose, and more deliberate. Average loudness was 69dB with SLP providing consistent mod-max A faded to occasional mod A for effort/intent. Specific cues not to be timid about speaking louder. SLP also used the phrase speak out. Pt to practice reading words with intent x20, BID.   05/12/24: SLP explained role in ST, need for pt to be more intentional and purposeful with speech, discussed pt's candidacy for skilled ST and based upon results today pt will be good candidate for improving speech loudness and clarity.  PATIENT EDUCATION: Education details: see treatment date this date, above Person educated: Patient Education method: Programmer, Multimedia, Demonstration, and Verbal cues Education comprehension: verbalized understanding, returned demonstration, verbal cues required, and needs further education  HOME EXERCISE PROGRAM: Eventually intentional and deliberate speech   GOALS: Goals reviewed with patient? No  SHORT TERM GOALS: Target date:  07/07/24 due to visit number  At visit 9 pt will have improved PROM compared to initial measurement Baseline: Goal status: met  2.  Pt will improve loudness to average 70dB in simple conversation in 2 sessions Baseline:  Goal status: not met, cont in LTGs  3.  Pt will demo AB in 5 minutes at rest, 85% success in 2 sessions Baseline:  Goal status: partially met, cont in LTGs  4.  Pt will demo AB in sentence responses 85% success in 2  sessions Baseline: 06/15/24 Goal status: met   LONG TERM GOALS: Target date:  09/04/24  Pt will improve PROM compared to STG measurement Baseline:  Goal status: deferred-left prior to completing  2.  Pt will demo AB in 5 minutes simple to mod complex conversation, 85% success, in 3 sessions Baseline: 07/28/24, 08/03/24 Goal status: cont  3. Pt will average 70dB in 8 minutes conversation Baseline:  Goal status: met  ASSESSMENT:  CLINICAL IMPRESSION: Patient is a 78 y.o. F who was seen today for treatment of speech in light of PD. See treatment date above for today's date for further details on today's session. She continues to demo improvement in ability to use more intent and in so doing, incr her speech clarity and maintain WNL volume in conversation. Pt does not endorse s/sx dysphagia at this time.   OBJECTIVE IMPAIRMENTS: Objective impairments include dysarthria. These impairments are limiting patient from household responsibilities, ADLs/IADLs, and effectively communicating at home and in community.Factors affecting potential to achieve goals and functional outcome are none noted today.. Patient will benefit from skilled SLP services to address above impairments and improve overall function.  REHAB POTENTIAL: Good  PLAN:  Discharge today.   PLANNED INTERVENTIONS: Environmental controls, Cueing hierachy, Internal/external aids, Oral motor exercises, Functional tasks, SLP instruction and feedback, Compensatory strategies, Patient/family education, and [id number] Treatment of speech (30 or 45 min)     Lupita Connor, MS, SLP 08/28/2024, 1:02 PM

## 2024-08-28 NOTE — Therapy (Deleted)
 OUTPATIENT SPEECH LANGUAGE PATHOLOGY [facility]   Patient Name: Andrea Burke MRN: [id number] DOB:1946-04-04, 78 y.o., female Today's Date: 08/28/2024  PCP: Allen Lauraine CROME, PA-C REFERRING PROVIDER: Evonnie Asberry RAMAN, DO  END OF SESSION:           Past Medical History:  Diagnosis Date   Cancer (HCC)    KIDNEY   Discoid lupus    Endometriosis    Headache(784.0)    Insomnia    Kidney disease    Lupus    Osteopenia    Urinary incontinence    Past Surgical History:  Procedure Laterality Date   CATARACT EXTRACTION, BILATERAL     PELVIC LAPAROSCOPY     DIAG LASER LAP-ENDOMETRIOSIS   REMOVAL OF LEFT KIDNEY  10/08/2006   Patient Active Problem List   Diagnosis Date Noted   Major depressive disorder, single episode, moderate (HCC) 08/06/2018   Moderate major depression (HCC) 06/23/2018   Anxiety disorder 06/23/2018   Pruritus 04/01/2017   Onychomycosis 05/12/2012   Lentigines 05/12/2012   Discoid lupus    Cancer (HCC)    Headache    Insomnia    Endometriosis    Osteopenia       ONSET DATE: script 04/21/24, diagnosed fall 2024  REFERRING DIAG:  R47.1 (ICD-10-CM) - Dysarthria    THERAPY DIAG:  Dysarthria and anarthria  Rationale for Evaluation and Treatment: Rehabilitation  SUBJECTIVE:   SUBJECTIVE STATEMENT: I can do it.   Pt accompanied by: self  PERTINENT HISTORY: See above   PAIN:  Are you having pain? Yes - mid-low back 5/10.   FALLS: Has patient fallen in last 6 months?  Yes, Number of falls: one   PATIENT GOALS: improve speech ability  OBJECTIVE:  Note: Objective measures were completed at Evaluation unless otherwise noted.  DIAGNOSTIC FINDINGS:  02/26/23 CLINICAL DATA:  Headache with increasing frequency or severity. EXAM: CT HEAD WITHOUT CONTRAST   TECHNIQUE: Contiguous axial images were obtained from the base of the skull through the vertex without intravenous contrast.   RADIATION DOSE REDUCTION: This  exam was performed according to the departmental dose-optimization program which includes automated exposure control, adjustment of the mA and/or kV according to patient size and/or use of iterative reconstruction technique.   COMPARISON:  None Available.   FINDINGS: Brain: No evidence of acute infarction, hemorrhage, hydrocephalus, extra-axial collection or mass lesion/mass effect. Mild for age cerebral volume loss   Vascular: No hyperdense vessel or unexpected calcification.   Skull: Normal. Negative for fracture or focal lesion.   Sinuses/Orbits: No acute finding.   IMPRESSION: No acute or reversible finding.   PATIENT REPORTED OUTCOME MEASURES (PROM): Communication Effectiveness Survey: (CES) provided 05/19/24 and pt scored herself 16/32 with higher scores indicating more effective communication across situations. Pt scored herself 1 (not at all effective) with taking part in a conversation in a noisy environment.  In re-administration on 07/15/24 pt scored 18/32, with 3 (mostly effective) with having a conversation with family member at home, and having a conversation with a familiar person over the phone. All other responses were 2 (somewhat effective).  TREATMENT DATE:  08/28/24:   08/20/24: SLP guided pt through conversational segments of 8 minutes. Pt maintained WNL volume 90% of the time. SLP increased the challenge by adding background noise. Pt performance was stable during this challenge. SLP educated pt about purpose of using intent during speech for optimizing muscular engagement for speech volume and strength. As pt is nearing d/c, she was curious about future changes related to speech/swallowing. SLP plans to review this in upcoming session.   08/03/24: SLP guided pt through conversational segments of 2-4 minutes. Pt maintained WNL volume  80% of the time with these. Because of pt's s, SLP reiterated pt will need to practice speech after d/c and helped pt problem solve which people she could talk with after d/c.   07/28/24: 3-4 minute conversational segments were measured today for WNL volume. With occasional min cues pt maintained WNL volume x5. SLP took pt outside to use more generalized surroundings and pt maintained intelligible volume in this setting also, for 7 minutes. SLP and pt agreed she could decr to once/week.    07/24/24: Pt states she had little stumbles with her speech prior to coming to ST. Pt had questions about decline of speech and SLP shared with pt that right now she's doing everything possible to afford success with her speech. SLP guided pt in short conversational segments of 45-90 seconds pt maintained average WNL volume 80% of the time. After 7 of these pt stated, I need to continue to use more effort when I talk. At the end of the session she stated s statement.  07/17/24: Pt has done slightly more practicing since last session. SLP appreciates pt's results from her neuropsych evaluation. Nothing of note other than likely age-related cognitive changes.Today in multi-sentence responses pt's volume was WNL 90% of the time. SLP told pt to try to practice more this week to ensure best possible opportunity for improvement.  07/15/24: Pt filled out PROM today - scored 18/32, greater than initial administration. Today pt maintained WNL volume with sentence and two-sentence responses, and was aware of lower than WNL responses demonstrated by self correcting 80% of the time. AB was noted in sentence responses 90%. Homework provided.  07/06/24: SLP used min A initially for pt to use intent and deliberate speech to improve volume and clarity of speech. Orma benefited from rare min A for intent in 2-4 minute conversational segments x10. Pt was provided homework for multisentence responses using intent and purpose,  lifting voice.   07/01/24: SLP assisted pt in speaking with more intent and purpose. In phrase level practice for warm up pt average volume was 71dB, and in sentence warm up pt's voice was average 71dB. In multiple sentence responses pt maintained intentional speech 85% of the time, with average 70dB. Homework to extend to today's session. In simple conversation pt's average was 70dB.  06/24/24: Pt admitted her practice was not 100% as she would have liked since last session. In phrase warm up pt used intent 90% of the time average 72dB. With sentneces from homework pt used intent 5/6, with average 70dB. Sentence responses with min-mod complex answers required average 70dB and intent noted 85% of the time; At times loudness decay heard. Homework provided. In simple conversation pt's volume decr'd to average 69dB with usual loudness decay.  06/22/24: Pt not adhering to practice schedule but is still making attempt to practice consistently. SLP encouraged Michelyn to cont to strive to practice as directed and provided two suggestions to accomplish this.  Today with sentence responses pt used intentional purposeful speech 90% of the time, average 70dB. Short Q and A conversational responses following pt's initial response resulted in pt producing average of 68dB. When asked to repeat or to use purposeful effortful speech pt's volume improved to 71dB average. Pt provided with homework.   06/15/24: No more falls since eval. Pt has been practicing at home. Word level responses were 95% with intent noted. Sentence level responses were noted with intent/purpose/mindfulness 88% of the time - loudness 71dB average. Pt self corrected once, and improved her production to 100% when asked by SLP to repeat due to not using purpose with her answer. Pt stated she felt like she was shouting at someone so she didn't want to use intent. SLP used digital recorder x2 to use auditory feedback to demo to pt she was not shouting. She  agreed with SLP that her speech was WNL volume. SLP showed her using diagram why she feels like she is shouting. Pt to cont to practice at sentence level.   06/12/24: To warm up SLP gave pt sentences to read with intent. Pt with 80% success. SLP targeted phrases and sentences with pt using intent and purpose in her speech. Pt demonstrated incr'd mindfulness with responses 75% of the time, improving articulation and making volume more consistently WNL range. SLP with explanation that pt's changing a habit to being more intentional and purpseful with her speech will benefit later as pt stated, If I do this why does it help? It's no different for the person I am talking to.   06/03/24: SLP targeted word level intent/purposeful speech with near 100% success. Pt  notably had difficulty with [employer]. Seemed concerned about this. SLP told pt to write down words she feels she gets tied up on and practice those  words if they are common words; SLP provided examples. SLP moved to 3-4 word phrases/sentences and pt success with WNL volume and clarity 90%. Pt to practice phrase level responses until next session. Pt cancelled some appointments after last session to bring some weeks to x1/week, and pt asked SLP if she could get them back. SLP asked pt to check with support rep.   05/19/24: SLP began session with providing pt with CES. SLP began today with pt reading with more intent, purpose, and more deliberate. Average loudness was 69dB with SLP providing consistent mod-max A faded to occasional mod A for effort/intent. Specific cues not to be timid about speaking louder. SLP also used the phrase speak out. Pt to practice reading words with intent x20, BID.   05/12/24: SLP explained role in ST, need for pt to be more intentional and purposeful with speech, discussed pt's candidacy for skilled ST and based upon results today pt will be good candidate for improving speech loudness and clarity.  PATIENT  EDUCATION: Education details: see treatment date this date, above Person educated: Patient Education method: Programmer, Multimedia, Demonstration, and Verbal cues Education comprehension: verbalized understanding, returned demonstration, verbal cues required, and needs further education  HOME EXERCISE PROGRAM: Eventually intentional and deliberate speech   GOALS: Goals reviewed with patient? No  SHORT TERM GOALS: Target date:  07/07/24 due to visit number  At visit 9 pt will have improved PROM compared to initial measurement Baseline: Goal status: met  2.  Pt will improve loudness to average 70dB in simple conversation in 2 sessions Baseline:  Goal status: not met, cont in LTGs  3.  Pt will demo AB in 5 minutes at  rest, 85% success in 2 sessions Baseline:  Goal status: partially met, cont in LTGs  4.  Pt will demo AB in sentence responses 85% success in 2 sessions Baseline: 06/15/24 Goal status: met   LONG TERM GOALS: Target date:  08/14/24  Pt will improve PROM compared to STG measurement Baseline:  Goal status: continue  2.  Pt will demo AB in 5 minutes simple to mod complex conversation, 85% success, in 3 sessions Baseline: 07/28/24, 08/03/24 Goal status: cont  3. Pt will average 70dB in 8 minutes conversation Baseline:  Goal status: INITIAL  ASSESSMENT:  CLINICAL IMPRESSION: Patient is a 78 y.o. F who was seen today for treatment of speech in light of PD. See treatment date above for today's date for further details on today's session. She continues to demo improvement in ability to use more intent and in so doing, incr her speech clarity and maintain WNL volume in conversation. Pt does not endorse s/sx dysphagia at this time.   OBJECTIVE IMPAIRMENTS: Objective impairments include dysarthria. These impairments are limiting patient from household responsibilities, ADLs/IADLs, and effectively communicating at home and in community.Factors affecting potential to achieve  goals and functional outcome are none noted today.. Patient will benefit from skilled SLP services to address above impairments and improve overall function.  REHAB POTENTIAL: Good  PLAN:  SLP FREQUENCY: 1-2x/week  SLP DURATION: 17 total  sessions  PLANNED INTERVENTIONS: Environmental controls, Cueing hierachy, Internal/external aids, Oral motor exercises, Functional tasks, SLP instruction and feedback, Compensatory strategies, Patient/family education, and [id number] Treatment of speech (30 or 45 min)     Waddell Music, CF-SLP 08/28/2024, 9:42 AM

## 2024-08-28 NOTE — Addendum Note (Signed)
 Addended by: JACELYN PITTS B on: 08/28/2024 01:00 PM   Modules accepted: Orders

## 2024-09-06 DIAGNOSIS — Z1211 Encounter for screening for malignant neoplasm of colon: Secondary | ICD-10-CM | POA: Diagnosis not present

## 2024-09-06 DIAGNOSIS — Z1212 Encounter for screening for malignant neoplasm of rectum: Secondary | ICD-10-CM | POA: Diagnosis not present

## 2024-09-18 ENCOUNTER — Ambulatory Visit: Admitting: Psychiatry

## 2024-09-18 DIAGNOSIS — F331 Major depressive disorder, recurrent, moderate: Secondary | ICD-10-CM

## 2024-09-18 NOTE — Progress Notes (Signed)
 [facility] Counselor/Therapist Progress Note  Patient ID: Andrea Burke, MRN: [id number],    Date: 09/18/2024  Time Spent: 53 minutes   Treatment Type: Individual Therapy  Reported Symptoms: anxiety, some depression, worrying about her and husband's health ( husband had knee surgery and is recovering at [facility]), and some grief re: mom's death    Mental Status Exam:  Appearance:   Neat and Well Groomed     Behavior:  Appropriate, Sharing, and Motivated  Motor:  Normal  Speech/Language:   Clear and Coherent  Affect:  Depressed, Tearful, and anxious  Mood:  anxious and depressed  Thought process:  goal directed  Thought content:    Rumination  Sensory/Perceptual disturbances:    WNL  Orientation:  oriented to person, place, time/date, situation, day of week, month of year, year, and stated date of Dec. 12, 2025  Attention:  Good  Concentration:  Good  Memory:  Some forgetting frequently  Fund of knowledge:   Good and Fair  Insight:    Good and Fair  Judgment:   Good  Impulse Control:  Good   Risk Assessment: Danger to Self:  No Self-injurious Behavior: No Danger to Others: No Duty to Warn:no Physical Aggression / Violence:No  Access to Firearms a concern: No  Gang Involvement:No   Subjective:  Patient today working on her depression, some sadness, grief re: loss of mother, and anxiety. Has been struggling some emotionally as her husband had recent surgery and is currently getting followup care at [facility] facility. Processing some of the grief re: loss of her mother in more detail today and having a difficult time working through her grief and being present for her husband as he recuperates in rehab after surgery. Realizes some of her strength and perseverance in trying to manage the death of her mother and helping support her other relatives. Also working on her fears of future and will pick up on this more next session.  Is gaining some strength very gradually.  Did  very well and letting her emotions out today and felt some relief.  Denies any SI. Showing some good strength at a time when she does not feel very strong understandably.  Will see again within the next 1-2 weeks.   Interventions: Cognitive Behavioral Therapy, Solution-Oriented/Positive Psychology, and Ego-Supportive Long-Term goal: (measurable)  Enhance the ability to handle effectively the full variety of life's anxieties. Patient will eventually rate herself as a 3 or under on a 1-10 scale for depression and for anxiety, for a period of at least 2 months.  Strategies:  Patient will work on re-framing thoughts that have been leading her to feel more anxious.  The re-framed thoughts will support more calmness and personal empowerment for patient.     Short Term goal:  Increase understanding of beliefs and messages that produce worry and anxiety. Strategies: Patient will work with therapist to gain more understanding of how anxiety and worry are supported by certain beliefs and messages.  She will be able to give at least 3 examples of such messages in an upcoming session.   Diagnosis:   ICD-10-CM   1. Major depressive disorder, recurrent episode, moderate (HCC)  F33.1       Plan: Patient doing some really good work on her fears, anxiety, and grief related work.  Has been still at [facility] facility after having knee replacement surgery and she has been able to stay there with him some which she feels has been helpful.  Today needed part of session to also vent and process a lot of her grief regarding the recent death of her mother with whom she was very close.  Also working on fears of the future especially regarding her and her husband's health and wellbeing.  Denies any SI.  Will see again in 1-2 weeks. Therapist continues to encourage patient in maintaining contact with people who are supportive of her, remain on her prescribed medications, maintain good contact with extended family, and  recognize the strength that she shows and working with goal-directed behaviors enabling her to move in a forward direction and supporting her overall improved emotional health, and her outlook going forward.  Andrea Burke has made progress and needs to keep working on her goal-directed behaviors to move in a healthier and more hopeful direction as she works to remain motivated and focused on her therapy goals helping her to t be as active as possible and involved with family and other people.  Goal review and progress/challenges noted with patient.  Next appointment within 4 weeks.   Barnie Bunde, LCSW

## 2024-09-23 ENCOUNTER — Ambulatory Visit (INDEPENDENT_AMBULATORY_CARE_PROVIDER_SITE_OTHER): Admitting: Physician Assistant

## 2024-09-23 ENCOUNTER — Encounter: Payer: Self-pay | Admitting: Physician Assistant

## 2024-09-23 DIAGNOSIS — G20A1 Parkinson's disease without dyskinesia, without mention of fluctuations: Secondary | ICD-10-CM

## 2024-09-23 DIAGNOSIS — R5381 Other malaise: Secondary | ICD-10-CM | POA: Diagnosis not present

## 2024-09-23 DIAGNOSIS — F4323 Adjustment disorder with mixed anxiety and depressed mood: Secondary | ICD-10-CM

## 2024-09-23 DIAGNOSIS — F4321 Adjustment disorder with depressed mood: Secondary | ICD-10-CM | POA: Diagnosis not present

## 2024-09-23 DIAGNOSIS — F419 Anxiety disorder, unspecified: Secondary | ICD-10-CM

## 2024-09-23 DIAGNOSIS — F330 Major depressive disorder, recurrent, mild: Secondary | ICD-10-CM

## 2024-09-23 DIAGNOSIS — R5383 Other fatigue: Secondary | ICD-10-CM

## 2024-09-23 MED ORDER — DULOXETINE HCL 30 MG PO CPEP: 30.0000 mg | ORAL_CAPSULE | Freq: Every day | ORAL | 3 refills | Status: AC

## 2024-09-23 MED ORDER — AMPHETAMINE-DEXTROAMPHETAMINE 5 MG PO TABS: 5.0000 mg | ORAL_TABLET | Freq: Every morning | ORAL | 0 refills | Status: DC

## 2024-09-23 NOTE — Progress Notes (Signed)
 "     [facility] Med Check  Patient ID: Andrea Burke,  MRN: [id number]  PCP: Allen Lauraine CROME, PA-C  Date of Evaluation: 09/23/2024 Time spent:20 minutes  Chief Complaint:  Chief Complaint   Depression; Follow-up     HISTORY/CURRENT STATUS: HPI for routine follow-up.  See Social history.  It's a tough time, this is the first holiday season since her Mom died. It's hard to say how much her meds are helping with the situational sadness she has. But if she misses the Adderall, she can tell her energy is much lower. Appetite is nl. Personal hygiene and ADLs are nl.  No mania, delirium, AH/VH.  No SI/HI.  Individual Medical History/ Review of Systems: Changes? :Yes   see neuro-cognitive eval last month.   Past medication trials: Remeron- vivid dreams, increased appetite Lexapro-nightmares Sertraline-agitation, insomnia, impaired concentration Paxil-intolerable side effects Fluoxetine-intolerable side effects Viibryd - Partial improvement on 20 mg. Does not want to take doses above 20 mg. Cymbalta  at doses higher than 30 mg causes nightmares Wellbutrin- Severe tremors Lithium  Hydroxyzine- has taken for itching.  Xanax Deplin Gabapentin- prescribed for pain  Allergies: Doxycycline calcium and Penicillins  Current Medications:  Current Outpatient Medications:    carbidopa -levodopa  (SINEMET  IR) 25-100 MG tablet, Take 1 tablet by mouth 3 (three) times daily., Disp: 270 tablet, Rfl: 1   clobetasol cream (TEMOVATE) 0.05 %, Apply topically., Disp: , Rfl:    Clobetasol Propionate (TEMOVATE) 0.05 % external spray, Apply to scalp twice daily x 2 wk, then daily x 2 wk, then every other day x 2 wk. Not to face., Disp: , Rfl:    finasteride (PROSCAR) 5 MG tablet, Take 5 mg by mouth daily., Disp: , Rfl:    lidocaine  (LIDODERM ) 5 %, Place 1 patch onto the skin daily. Remove & Discard patch within 12 hours or as directed by MD, Disp: 30 patch, Rfl: 0   amphetamine -dextroamphetamine   (ADDERALL) 5 MG tablet, Take 1-1.5 tablets (5-7.5 mg total) by mouth every morning., Disp: 45 tablet, Rfl: 0   DULoxetine  (CYMBALTA ) 30 MG capsule, Take 1 capsule (30 mg total) by mouth daily., Disp: 90 capsule, Rfl: 3   hydroxychloroquine (PLAQUENIL) 200 MG tablet, Take by mouth daily. (Patient not taking: Reported on 08/13/2024), Disp: , Rfl:  Medication Side Effects: none  Family Medical/ Social History: Changes?  Husband had knee surgery and is in [facility], probably going home in the next week.   MENTAL HEALTH EXAM:  There were no vitals taken for this visit.There is no height or weight on file to calculate BMI.  General Appearance: Casual and Well Groomed  Eye Contact:  Good  Speech:  Clear and Coherent and Normal Rate  Volume:  Normal  Mood:  Sad  Affect:  sad and tearful when talking about her Mom  Thought Process:  Goal Directed and Descriptions of Associations: Circumstantial  Orientation:  Full (Time, Place, and Person)  Thought Content: Logical   Suicidal Thoughts:  No  Homicidal Thoughts:  No  Memory:  WNL  Judgement:  Good  Insight:  Good  Psychomotor Activity:  Normal  Concentration:  Concentration: Fair and Attention Span: Good  Recall:  Good  Fund of Knowledge: Good  Language: Good  Assets:  Desire for Improvement Financial Resources/Insurance Housing Transportation Vocational/Educational  ADL's:  Intact  Cognition: WNL  Prognosis:  Good   DIAGNOSES:    ICD-10-CM   1. Situational mixed anxiety and depressive disorder  F43.23     2. Anxiety  disorder, unspecified type  F41.9 DULoxetine  (CYMBALTA ) 30 MG capsule    3. Malaise and fatigue  R53.81    R53.83     4. Grief  F43.21     5. Parkinson's disease, unspecified whether dyskinesia present, unspecified whether manifestations fluctuate (HCC)  G20.A1      Receiving Psychotherapy: Yes with Marval Bunde, LCSW.  RECOMMENDATIONS:   PDMP Reviewed.  Adderall filled 08/04/2024. I provided approximately  20  minutes of face to face time during this encounter, including time spent before and after the visit in records review, medical decision making, counseling pertinent to today's visit, and charting.   I'm sorry this is a tough time. Try to take at least 30 mins per day doing something for herself. We decided to make no medication changes for now. Hopefully after her husband gets home and heals, and after the holidays are over, she feel much better.   Continue  Adderall 5 mg,  1.5 pills daily. Continue Cymbalta  30 mg, 1 p.o. q evening.   Continue therapy with Marval Bunde, LCSW. Return in 2 months.   Verneita Cooks, PA-C  "

## 2024-10-05 ENCOUNTER — Ambulatory Visit: Admitting: Psychiatry

## 2024-10-07 ENCOUNTER — Ambulatory Visit (INDEPENDENT_AMBULATORY_CARE_PROVIDER_SITE_OTHER): Admitting: Psychiatry

## 2024-10-07 DIAGNOSIS — F4323 Adjustment disorder with mixed anxiety and depressed mood: Secondary | ICD-10-CM | POA: Diagnosis not present

## 2024-10-07 NOTE — Progress Notes (Signed)
 "       [facility] Counselor/Therapist Progress Note  Patient ID: Andrea Burke, MRN: [id number],    Date: 10/07/2024  Time Spent: 53 minutes   Treatment Type: Individual Therapy  Reported Symptoms: anxiety, some depression, worrying about husband's health after knee surgery and some other issues but improving gradually, and some grief related to recent death of mother   Mental Status Exam:  Appearance:   Casual and Neat     Behavior:  Appropriate, Sharing, and Motivated  Motor:  Normal and more conscious of being older and need to be more careful moving about  Speech/Language:   Clear and Coherent  Affect:  anxious  Mood:  anxious, depressed, irritable, and sad  Thought process:  goal directed  Thought content:    Rumination  Sensory/Perceptual disturbances:    WNL  Orientation:  oriented to person, place, time/date, situation, day of week, month of year, year, and stated date of Dec. 31, 2025  Attention:  Good  Concentration:  Good and Fair  Memory:  WNL  Fund of knowledge:   Fair  Insight:    Good  Judgment:   Good and Fair  Impulse Control:  Good and Fair   Risk Assessment: Danger to Self:  No Self-injurious Behavior: No Danger to Others: No Duty to Warn:no Physical Aggression / Violence:No  Access to Firearms a concern: No  Gang Involvement:No   Subjective:  Patient today focusing more on her grief, sadness, anxiety and depression, a lot of which is related to her mother's death and some concern for husband's health. Missed seeing family at holidays due to husband's recent hospital discharge.  Denies any SI.  Needed session today to process more of her grief and sadness, and also some anxiety which she is managing some better. Tearfulness off and on and does feel she is getting some better gradually; mom's death has been quite difficult for patient. Shared that she and husband are thinking of adopting another dog after losing their previous one who died over a yr  ago. Does smile some when talking about eventually getting a dog. Talked very openly today about the loss of her beloved mother, and also some decline she is noticing in her husband. Focused on taking good physical and emotional care of herself as she continues to work through her grief/anxiety and noticing some progress.  Seems to be realizing some that in sharing more openly her emotions in sessions, helps her gain more strength going forward.  Showing more strength and is also following up as noted above in taking better physical and emotional care of herself.  To return within 2-3 weeks and will call if needed before that time.   Interventions: Cognitive Behavioral Therapy, Solution-Oriented/Positive Psychology, and Ego-Supportive Long-Term goal: (measurable)  Enhance the ability to handle effectively the full variety of life's anxieties. Patient will eventually rate herself as a 3 or under on a 1-10 scale for depression and for anxiety, for a period of at least 2 months.  Strategies:  Patient will work on re-framing thoughts that have been leading her to feel more anxious.  The re-framed thoughts will support more calmness and personal empowerment for patient.     Short Term goal:  Increase understanding of beliefs and messages that produce worry and anxiety. Strategies: Patient will work with therapist to gain more understanding of how anxiety and worry are supported by certain beliefs and messages.  She will be able to give at least 3 examples of such  messages in an upcoming session.    Diagnosis:   ICD-10-CM   1. Situational mixed anxiety and depressive disorder  F43.23      Plan: Patient working further today on her grief, some fears, anxiety, sadness, all of which are related to her grief and loss over the death of her mother recently and concerns for her husband's health as he had recent surgery and had to be in rehab a while and now has recently come home.  Her son and his family  came to visit as well as some other people to check in on them recently which were helpful.  Does have multiple family members who are very supportive although the list of approximately an hour away, but are very reachable and responsive as needed.  Was more open today with some of her grief and sadness and also able to see some of her strengths that I do not think she had been able to see previously.  More open and her talking and felt more grounded by the time her session had ended.  Encouraged patient to continue reaching out to friends and family as needed, particularly those that she feels closer to and who are very caring and responsive.  Patient has tended to be more hesitant and reaching out to people however I supported her willingness to be more in contact with people when needed and she seems to be more accepting of this and realizing how it is really helpful for her and her husband.  She denies any SI and gives no indication of this.  Continue to encourage her to remain on her prescribed medication, stay in good contact with extended family and friends, and realize the strength she does show when working with goal-directed behaviors that help her move in a forward direction and support her overall improved emotional health and outlook into the future.  Andrea Burke is making progress and needs to keep working on her goal-directed behaviors to keep moving in a healthier and more hopeful direction as she remains motivated and focused on therapy goals helping her to be as involved and active as possible with family and friends.  Review and progress/challenges noted with patient.  Next appointment within 3 to 4 weeks.   Barnie Bunde, LCSW                   "

## 2024-10-19 ENCOUNTER — Ambulatory Visit: Admitting: Psychiatry

## 2024-11-03 ENCOUNTER — Ambulatory Visit: Admitting: Psychiatry

## 2024-11-09 ENCOUNTER — Ambulatory Visit: Admitting: Psychiatry

## 2024-11-11 ENCOUNTER — Ambulatory Visit: Admitting: Psychiatry

## 2024-11-12 ENCOUNTER — Telehealth: Payer: Self-pay | Admitting: Physician Assistant

## 2024-11-12 ENCOUNTER — Other Ambulatory Visit: Payer: Self-pay

## 2024-11-12 MED ORDER — AMPHETAMINE-DEXTROAMPHETAMINE 5 MG PO TABS: 5.0000 mg | ORAL_TABLET | Freq: Every morning | ORAL | 0 refills | Status: AC

## 2024-11-12 NOTE — Telephone Encounter (Signed)
 Pended

## 2024-11-12 NOTE — Telephone Encounter (Signed)
 Patient called in for refill on Adderall 5mg . Ph: [phone] appt 2/6 Pharmacy [facility] [address] [place], [place]

## 2024-11-13 ENCOUNTER — Ambulatory Visit: Admitting: Psychiatry

## 2024-11-13 DIAGNOSIS — F4323 Adjustment disorder with mixed anxiety and depressed mood: Secondary | ICD-10-CM

## 2024-11-13 NOTE — Progress Notes (Signed)
 "       [facility] Counselor/Therapist Progress Note  Patient ID: Andrea Burke, MRN: [id number],    Date: 11/13/2024  Time Spent: 60 minutes   Treatment Type: Individual Therapy  Reported Symptoms: anxiety, some depression, sadness, grief re: mom's death, noticing some decline in husband (some cognitive issues) not sure if he is aware or not of his decline  Mental Status Exam:  Appearance:   Neat and Well Groomed     Behavior:  Appropriate, Sharing, and Motivated  Motor:  Normal  Speech/Language:   Clear and Coherent  Affect:  Depressed and anxious  Mood:  anxious, depressed, and sad  Thought process:  goal directed  Thought content:    WNL  Sensory/Perceptual disturbances:    WNL  Orientation:  oriented to person, place, time/date, situation, day of week, month of year, year, and stated date of Feb. 6, 2026  Attention:  Good  Concentration:  Good and Fair  Memory:  WNL  Fund of knowledge:   Good  Insight:    Good  Judgment:   Good  Impulse Control:  Good   Risk Assessment: Danger to Self:  No Self-injurious Behavior: No Danger to Others: No Duty to Warn:no Physical Aggression / Violence:No  Access to Firearms a concern: No  Gang Involvement:No   Subjective:  Working further today on her anxiety, some depression re: husband's illness and trying to recuperate, being more self-understanding, and managing each day and each situation (per patient). Working to not always take husband's comments in a hurtful way as he does that more when he is not feeling good and I need to remember that. Worked in session today processing a lot of her depressed and anxious thoughts today which seemed helpful to patient. Tearful off and on that that also seemed to help patient in her mood eventually. Seemed to show more strength and some optimism by end of session, and definitely believing in herself a little more and seeing some of her own strength.    Interventions: Cognitive  Behavioral Therapy, Solution-Oriented/Positive Psychology, and Ego-Supportive Long term goal (measurable): Enhance the ability to handle effectively a variety of life's anxieties.  Patient will eventually rate herself as a 3 or under on a 1-10 scale for depression and for anxiety, for a period of at least 2 months Strategies: Patient will work on reframing thoughts that have been leading her to feel more anxious.  The reframe thoughts will support more calmness and personal empowerment for patient. Short-term goal: Increased understanding of beliefs and messages that produce her worrying and anxiety. Strategies: Patient will work with therapist to gain more understanding of how anxiety and worry are supported by certain beliefs and messages.  She will be able to give at least 3 examples of such messages and upcoming sessions, as she works to decrease/eliminate problem messages.  Diagnosis:   ICD-10-CM   1. Situational mixed anxiety and depressive disorder  F43.23       Plan: Patient today showing some stronger motivation as she worked further on depression, anxiety, family losses, and the situation with my husband.  Very difficult work as she is very sad about some of the decline with her husband but also able to notice some of his strengths and efforts to cope with certain situations.  Does have some support from extended family which is very helpful for patient as they do not live locally.  Definitely encouraged her to continue reaching out to friends and family as needed/wanted and  particularly those people in her life that have been more tuned into her situation and responsive.  Therapist encouraging patient also to stay on her prescribed medication, remain in good contact with friends and extended family, and recognize the strength she shows when working with goal-directed behaviors that are helping her move in a forward direction and support her overall improved emotional health and her  efforts to lean into the future with a more hopeful outlook.  Andrea Burke has made progress and needs to keep working with her goal-directed behaviors so as to keep moving in a healthier and more hopeful direction as she continues to be motivated and focused on her therapy goals helping her to be more involved with other people, realizing her own needs more, and remain active as possible with family and friends as she continues to be primary caretaker for her husband.  Husband is able to do some things for himself however since his last hospitalization, he has needed more help from patient and together they tried to get through the more difficult times and also enjoy sometimes together.  Goal review and progress/challenges noted.   Next appt within 2-3 weeks.   Barnie Bunde, LCSW                   "

## 2024-11-24 ENCOUNTER — Ambulatory Visit: Admitting: Physician Assistant

## 2024-11-26 ENCOUNTER — Ambulatory Visit: Admitting: Physician Assistant

## 2024-12-02 ENCOUNTER — Ambulatory Visit: Admitting: Psychiatry

## 2025-02-12 ENCOUNTER — Ambulatory Visit: Admitting: Neurology

## 2025-05-06 ENCOUNTER — Ambulatory Visit

## 2025-05-06 ENCOUNTER — Ambulatory Visit: Admitting: Occupational Therapy

## 2025-05-06 ENCOUNTER — Ambulatory Visit: Admitting: Physical Therapy
